# Patient Record
Sex: Female | Born: 1970 | Race: Black or African American | Hispanic: No | Marital: Single | Smoking: Never smoker
Health system: Southern US, Community
[De-identification: ages and names within clinical notes are randomized; demographics above are authoritative.]

## PROBLEM LIST (undated history)

## (undated) DIAGNOSIS — F419 Anxiety disorder, unspecified: Secondary | ICD-10-CM

## (undated) DIAGNOSIS — M8580 Other specified disorders of bone density and structure, unspecified site: Secondary | ICD-10-CM

## (undated) DIAGNOSIS — F32A Depression, unspecified: Secondary | ICD-10-CM

## (undated) DIAGNOSIS — E039 Hypothyroidism, unspecified: Secondary | ICD-10-CM

## (undated) DIAGNOSIS — D649 Anemia, unspecified: Secondary | ICD-10-CM

## (undated) DIAGNOSIS — Z87898 Personal history of other specified conditions: Secondary | ICD-10-CM

## (undated) DIAGNOSIS — C50919 Malignant neoplasm of unspecified site of unspecified female breast: Secondary | ICD-10-CM

## (undated) DIAGNOSIS — F329 Major depressive disorder, single episode, unspecified: Secondary | ICD-10-CM

## (undated) HISTORY — DX: Depression, unspecified: F32.A

## (undated) HISTORY — DX: Major depressive disorder, single episode, unspecified: F32.9

## (undated) HISTORY — DX: Anxiety disorder, unspecified: F41.9

## (undated) HISTORY — DX: Other specified disorders of bone density and structure, unspecified site: M85.80

## (undated) HISTORY — DX: Anemia, unspecified: D64.9

## (undated) HISTORY — DX: Malignant neoplasm of unspecified site of unspecified female breast: C50.919

---

## 2005-12-16 ENCOUNTER — Inpatient Hospital Stay (HOSPITAL_COMMUNITY): Admission: AD | Admit: 2005-12-16 | Discharge: 2005-12-18 | Payer: Self-pay | Admitting: Obstetrics & Gynecology

## 2007-02-08 ENCOUNTER — Emergency Department (HOSPITAL_COMMUNITY): Admission: EM | Admit: 2007-02-08 | Discharge: 2007-02-08 | Payer: Self-pay | Admitting: Emergency Medicine

## 2007-03-25 ENCOUNTER — Emergency Department (HOSPITAL_COMMUNITY): Admission: EM | Admit: 2007-03-25 | Discharge: 2007-03-25 | Payer: Self-pay | Admitting: Emergency Medicine

## 2008-04-14 ENCOUNTER — Emergency Department (HOSPITAL_COMMUNITY): Admission: EM | Admit: 2008-04-14 | Discharge: 2008-04-15 | Payer: Self-pay | Admitting: Emergency Medicine

## 2008-07-11 ENCOUNTER — Emergency Department (HOSPITAL_COMMUNITY): Admission: EM | Admit: 2008-07-11 | Discharge: 2008-07-12 | Payer: Self-pay | Admitting: Emergency Medicine

## 2008-12-20 ENCOUNTER — Emergency Department (HOSPITAL_COMMUNITY): Admission: EM | Admit: 2008-12-20 | Discharge: 2008-12-20 | Payer: Self-pay | Admitting: Emergency Medicine

## 2010-03-01 ENCOUNTER — Ambulatory Visit: Payer: Self-pay | Admitting: Physician Assistant

## 2010-03-01 DIAGNOSIS — R079 Chest pain, unspecified: Secondary | ICD-10-CM | POA: Insufficient documentation

## 2010-03-01 DIAGNOSIS — R519 Headache, unspecified: Secondary | ICD-10-CM | POA: Insufficient documentation

## 2010-03-01 DIAGNOSIS — F329 Major depressive disorder, single episode, unspecified: Secondary | ICD-10-CM

## 2010-03-01 DIAGNOSIS — R51 Headache: Secondary | ICD-10-CM

## 2010-03-01 DIAGNOSIS — F411 Generalized anxiety disorder: Secondary | ICD-10-CM | POA: Insufficient documentation

## 2010-03-01 LAB — CONVERTED CEMR LAB
ALT: 20 units/L (ref 0–35)
Basophils Absolute: 0 10*3/uL (ref 0.0–0.1)
Basophils Relative: 0 % (ref 0–1)
Calcium: 9.4 mg/dL (ref 8.4–10.5)
Chloride: 102 meq/L (ref 96–112)
Eosinophils Absolute: 0 10*3/uL (ref 0.0–0.7)
Eosinophils Relative: 1 % (ref 0–5)
HCT: 36.7 % (ref 36.0–46.0)
Hemoglobin: 12 g/dL (ref 12.0–15.0)
Lymphocytes Relative: 37 % (ref 12–46)
Lymphs Abs: 1.9 10*3/uL (ref 0.7–4.0)
MCHC: 32.7 g/dL (ref 30.0–36.0)
MCV: 83.4 fL (ref 78.0–100.0)
Platelets: 225 10*3/uL (ref 150–400)
Potassium: 4.1 meq/L (ref 3.5–5.3)
RDW: 13.5 % (ref 11.5–15.5)
Sodium: 136 meq/L (ref 135–145)
Total Bilirubin: 0.3 mg/dL (ref 0.3–1.2)
Total Protein: 7.7 g/dL (ref 6.0–8.3)
WBC: 5.2 10*3/uL (ref 4.0–10.5)

## 2010-03-06 ENCOUNTER — Encounter: Payer: Self-pay | Admitting: Physician Assistant

## 2010-04-30 ENCOUNTER — Encounter: Payer: Self-pay | Admitting: Physician Assistant

## 2010-05-09 ENCOUNTER — Other Ambulatory Visit: Admission: RE | Admit: 2010-05-09 | Discharge: 2010-05-09 | Payer: Self-pay | Admitting: Internal Medicine

## 2010-05-09 ENCOUNTER — Ambulatory Visit: Payer: Self-pay | Admitting: Physician Assistant

## 2010-05-09 ENCOUNTER — Telehealth: Payer: Self-pay | Admitting: Physician Assistant

## 2010-05-09 DIAGNOSIS — R82998 Other abnormal findings in urine: Secondary | ICD-10-CM | POA: Insufficient documentation

## 2010-05-09 DIAGNOSIS — L910 Hypertrophic scar: Secondary | ICD-10-CM | POA: Insufficient documentation

## 2010-05-09 LAB — CONVERTED CEMR LAB
Protein, U semiquant: NEGATIVE
Rapid HIV Screen: NEGATIVE
Specific Gravity, Urine: >=1.03
Urobilinogen, UA: 0.2
pH: 6

## 2010-05-10 ENCOUNTER — Encounter: Payer: Self-pay | Admitting: Physician Assistant

## 2010-05-10 LAB — CONVERTED CEMR LAB
Cholesterol, target level: 200 mg/dL
Cholesterol: 194 mg/dL (ref 0–200)
HDL: 53 mg/dL (ref >39)
Total CHOL/HDL Ratio: 3.7
VLDL: 12 mg/dL (ref 0–40)

## 2010-05-14 ENCOUNTER — Telehealth: Payer: Self-pay | Admitting: Physician Assistant

## 2010-05-15 ENCOUNTER — Encounter: Payer: Self-pay | Admitting: Physician Assistant

## 2010-05-16 ENCOUNTER — Telehealth: Payer: Self-pay | Admitting: Physician Assistant

## 2010-05-20 ENCOUNTER — Ambulatory Visit (HOSPITAL_COMMUNITY): Admission: RE | Admit: 2010-05-20 | Discharge: 2010-05-20 | Payer: Self-pay | Admitting: Internal Medicine

## 2010-06-14 ENCOUNTER — Telehealth: Payer: Self-pay | Admitting: Physician Assistant

## 2010-06-18 ENCOUNTER — Encounter: Payer: Self-pay | Admitting: Physician Assistant

## 2010-10-01 NOTE — Assessment & Plan Note (Signed)
Summary: NEW MEDICARE PT/ FIRST EST CARE//GK   Vital Signs:  Patient profile:   40 year old female Menstrual status:  regular LMP:     01/31/2010 Height:      64 inches Weight:      178 pounds BMI:     30.66 Temp:     97.8 degrees F oral Pulse rate:   66 / minute Pulse rhythm:   regular Resp:     18 per minute BP sitting:   103 / 69  (left arm) Cuff size:   regular  Vitals Entered By: [place] Shannon (March 01, 2010 11:56 AM) CC: pt says she has been having headaches off and on... pt would take bayer which helps a little... pt says she has sharp pain in her left breast x for a while now... Is Patient Diabetic? No Pain Assessment Patient in pain? no       Does patient need assistance? Functional Status Self care Ambulation Normal LMP (date): 01/31/2010     Menstrual Status regular Enter LMP: 01/31/2010   Primary Care Provider:  Tereso Newcomer, PA-C  CC:  pt says she has been having headaches off and on... pt would take bayer which helps a little... pt says she has sharp pain in her left breast x for a while now....  History of Present Illness: New patient.  Health maint: No pap in at least 4 years.  Never abnormal. Never had a mammo. Td shot out of date. Periods regular.  Last 2 days and can be heavy.    Headaches:  Has had for few years.  Notes when she is stressed or hungry.  Notes posterior and neck and frontal.  Not unilateral.  Has had rare occ. with ? scotoma.  + throbbing.  Has to stop everything she is doing.  No photophobia.  No nausea.  No tunnel vision or change in sense of smell.  Chest/Breast pain: On left side.  Went to ER in past.  Feels like pressure.  Notices with stress.   Walking makes her feel better.  No exertional pain.  Does have some DOE, but not significant.  No syncope.  No arm or jaw pain.  No palps.  No orthopnea, PND or edema.    Habits & Providers  Alcohol-Tobacco-Diet     Tobacco Status: never  Exercise-Depression-Behavior  Drug Use: no  Current Medications (verified): 1)  None  Allergies (verified): No Known Drug Allergies  Past History:  Past Medical History: Depression   a.  previously on meds (cymbalta; pristiq)   b.  had side effects and stopped   c.  followed at [facility] Anxiety  Past Surgical History: Denies surgical history  Family History: Mom - ovarian cancer; ?breast cancer GM - diabetes, HTN, ESRD Family History Depression - mom Schizophrenia - aunt  Social History: Single 4 kids Never Smoked Alcohol use-yes (rare) Drug use-no Smoking Status:  never Drug Use:  no  Review of Systems      See HPI Resp:  Denies cough and pleuritic. GI:  Denies bloody stools and dark tarry stools. GU:  Denies hematuria.  Physical Exam  General:  alert, well-developed, and well-nourished.   Head:  normocephalic and atraumatic.   Eyes:  pupils equal, pupils round, pupils reactive to light, and no optic disk abnormalities.   Ears:  R ear normal and L ear normal.   Nose:  no external deformity.   Mouth:  pharynx pink and moist.   Neck:  supple and no cervical lymphadenopathy.   Breasts:  skin/areolae normal, no masses, no abnormal thickening, no nipple discharge, no tenderness, and no adenopathy.   Lungs:  normal breath sounds, no crackles, and no wheezes.   Heart:  normal rate, regular rhythm, and no murmur.   Abdomen:  soft, non-tender, normal bowel sounds, and no hepatomegaly.   Neurologic:  alert & oriented X3 and cranial nerves II-XII intact.   Skin:  keloid center of chest Psych:  normally interactive.     Impression & Recommendations:  Problem # 1:  HEADACHE (ICD-784.0)  most likely tension headaches naproxen as needed advised her to keep headache diary f/u sooner if worse  Orders: T-Comprehensive Metabolic Panel ([id number]) T-CBC w/Diff (53664-40347) T-TSH (42595-63875)  Her updated medication list for this problem includes:    Naproxen 500 Mg  Tabs (Naproxen) .Marland Kitchen... Take 1 tablet by mouth two times a day with food as needed  Problem # 2:  DEPRESSION (ICD-311)  f/u with psych perhaps, finding the  right medicine may help her headaches  Orders: T-Comprehensive Metabolic Panel [phone]) T-CBC w/Diff (41660-63016) T-TSH [phone])  Problem # 3:  CHEST PAIN UNSPECIFIED (ICD-786.50) prob chest wall pain no CRFs  check ECG  reassurance  Orders: EKG w/ Interpretation (93000) T-Comprehensive Metabolic Panel ([id number]) T-CBC w/Diff (62376-28315) T-TSH (17616-07371)  Problem # 4:  Preventive Health Care (ICD-V70.0) schedule CPP get Td today  Orders: T-Comprehensive Metabolic Panel ([id number]) T-CBC w/Diff (27035-00938) T-TSH (18299-37169)  Complete Medication List: 1)  Naproxen 500 Mg Tabs (Naproxen) .... Take 1 tablet by mouth two times a day with food as needed  Patient Instructions: 1)  Td shot today. 2)  Keep a headache diary and bring to your next appointment. 3)  Please schedule a follow-up appointment in 2 months with Damaris Geers for CPP.  Come fasting so we can check your cholesterol. 4)  Use Naproxen two times a day with food as needed for headaches.  Prescriptions: NAPROXEN 500 MG TABS (NAPROXEN) Take 1 tablet by mouth two times a day with food as needed  #30 x 1   Entered and Authorized by:   Tereso Newcomer PA-C   Signed by:   Tereso Newcomer PA-C on 03/01/2010   Method used:   Print then Give to Patient   RxID:   [id number]    EKG  Procedure date:  03/01/2010  Findings:      Normal sinus rhythm with rate of:  70 normal axis no isch changes j point elevation

## 2010-10-01 NOTE — Letter (Signed)
Summary: [facility]  [facility]   Imported By: Arta Bruce 05/10/2010 14:21:58  _____________________________________________________________________  External Attachment:    Type:   Image     Comment:   External Document

## 2010-10-01 NOTE — Letter (Signed)
Summary: *HSN Results Follow up  [facility]  [address] [place], [place] [address]   Phone: [phone]  Fax: [phone]      05/10/2010   ZEPPELIN BECKSTRAND Rossel [address] [place], [place]  [address]   Dear  Ms. Jazmeen Guess,                            ____S.Drinkard,FNP   ____D. Gore,FNP       ____B. McPherson,MD   ____V. Rankins,MD    ____E. Mulberry,MD    ____N. Daphine Deutscher, FNP  ____D. Reche Dixon, MD    ____K. Philipp Deputy, MD    __x__S. Alben Spittle, PA-C     This letter is to inform you that your recent test(s):  _______Pap Smear    ___x____Lab Test     _______X-ray    ___x____ is within acceptable limits  _______ requires a medication change  _______ requires a follow-up lab visit  _______ requires a follow-up visit with your provider   Comments: Total cholesterol = 194; Triglycerides: 61; HDL(good cholesterol) = 53; LDL (bad cholesterol) = 129.  These numbers are good.       _________________________________________________________ If you have any questions, please contact our office                     Sincerely,  Tereso Newcomer PA-C [facility]

## 2010-10-01 NOTE — Letter (Signed)
Summary: *HSN Results Follow up  [facility]  [address] [place], [place] [address]   Phone: [phone]  Fax: [phone]      03/06/2010   [name] Zapf [address] [place], [place]  [address]   Dear  Ms. Maria Carter,                            ____S.Drinkard,FNP   ____D. Gore,FNP       ____B. McPherson,MD   ____V. Rankins,MD    ____E. Mulberry,MD    ____N. Daphine Deutscher, FNP  ____D. Reche Dixon, MD    ____K. Philipp Deputy, MD    __x__S. Alben Spittle, PA-C    This letter is to inform you that your recent test(s):  _______Pap Smear    ___x____Lab Test     _______X-ray    ___x____ is within acceptable limits  _______ requires a medication change  _______ requires a follow-up lab visit  _______ requires a follow-up visit with your provider   Comments:       _________________________________________________________ If you have any questions, please contact our office                     Sincerely,  Tereso Newcomer PA-C [facility]

## 2010-10-01 NOTE — Letter (Signed)
Summary: *HSN Results Follow up  [facility]  [address] [place], [place] [address]   Phone: [phone]  Fax: [phone]      05/15/2010   Maria Carter [place] [address] [place], [place]  [address]   Dear  [name],                            ____S.Drinkard,FNP   ____D. Gore,FNP       ____B. McPherson,MD   ____V. Rankins,MD    ____E. Mulberry,MD    ____N. Daphine Deutscher, FNP  ____D. Reche Dixon, MD    ____K. Philipp Deputy, MD    __x__S. Alben Spittle, PA-C     This letter is to inform you that your recent test(s):  ___x____Pap Smear    _______Lab Test     _______X-ray    ___x____ is normal  _______ requires a medication change  _______ requires a follow-up lab visit  _______ requires a follow-up visit with your provider   Comments:       _________________________________________________________ If you have any questions, please contact our office                     Sincerely,  Tereso Newcomer PA-C [facility]

## 2010-10-01 NOTE — Progress Notes (Signed)
  Phone Note Outgoing Call   Summary of Call: PT HAS A SCRIPT TO FAX/CALLED PT L/M TO GIVE [place] A CALL BACK Augustina Mood  TO FAX Initial call taken by: Arta Bruce,  May 14, 2010 12:53 PM  Follow-up for Phone Call        [facility] Follow-up by: [place] Shannon,  May 14, 2010 3:32 PM

## 2010-10-01 NOTE — Assessment & Plan Note (Signed)
Summary: CPP /tmm   Vital Signs:  Patient profile:   40 year old female Menstrual status:  regular Height:      64 inches Weight:      183 pounds BMI:     31.53 Temp:     97.8 degrees F oral Pulse rate:   64 / minute Pulse rhythm:   regular Resp:     18 per minute BP sitting:   132 / 90  (left arm) Cuff size:   regular  Vitals Entered By: Linzie Collin student cma (May 09, 2010 9:09 AM) CC: cpp.............Marland Kitchen review meds.. pt says she is not taking any Is Patient Diabetic? No Pain Assessment Patient in pain? no       Does patient need assistance? Functional Status Self care Ambulation Normal   Primary Care Provider:  Tereso Newcomer, PA-C  CC:  cpp.............Marland Kitchen review meds.. pt says she is not taking any.  History of Present Illness: Here for CPP. No h/o abnormal paps. No abnormal bleeding, discharge or odor. +FHx of Breast CA - sister. + Depression  Problems Prior to Update: 1)  Keloid Scar  (ICD-701.4) 2)  Urinalysis, Abnormal  (ICD-791.9) 3)  Breast Cancer, Family Hx  (ICD-V16.3) 4)  Preventive Health Care  (ICD-V70.0) 5)  Chest Pain Unspecified  (ICD-786.50) 6)  Headache  (ICD-784.0) 7)  Family History Depression  (ICD-V17.0) 8)  Anxiety  (ICD-300.00) 9)  Depression  (ICD-311)  Allergies (verified): No Known Drug Allergies  Past History:  Past Medical History: Last updated: 03/01/2010 Depression   a.  previously on meds (cymbalta; pristiq)   b.  had side effects and stopped   c.  followed at [facility] Anxiety  Past Surgical History: Last updated: 03/01/2010 Denies surgical history  Family History: Reviewed history from 03/01/2010 and no changes required. Mom - ovarian cancer; ?breast cancer GM - diabetes, HTN, ESRD Family History Depression - mom Schizophrenia - aunt  Social History: Reviewed history from 03/01/2010 and no changes required. Single 4 kids Never Smoked Alcohol use-yes (rare) Drug  use-no  Review of Systems      See HPI General:  Denies chills and fever. CV:  Denies shortness of breath with exertion. GI:  Denies bloody stools and dark tarry stools. GU:  Denies dysuria and hematuria. Derm:  Complains of lesion(s); keloids on chest . Psych:  See HPI; Denies suicidal thoughts/plans.  Physical Exam  General:  alert, well-developed, and well-nourished.   Head:  normocephalic and atraumatic.   Eyes:  pupils equal, pupils round, and pupils reactive to light.   Ears:  R ear normal and L ear normal.   Nose:  no external deformity.   Mouth:  pharynx pink and moist, no erythema, and no exudates.   Neck:  supple, no thyromegaly, and no cervical lymphadenopathy.   Breasts:  deferred done last visit  Lungs:  normal breath sounds, no crackles, and no wheezes.   Heart:  normal rate and regular rhythm.   Abdomen:  soft, non-tender, normal bowel sounds, and no hepatomegaly.   Rectal:  deferred Genitalia:  normal introitus, no external lesions, mucosa pink and moist, no vaginal or cervical lesions, no vaginal atrophy, normal uterus size and position, and no adnexal masses or tenderness.   milky white discharge Msk:  normal ROM.   Pulses:  R posterior tibial normal, R dorsalis pedis normal, L posterior tibial normal, and L dorsalis pedis normal.   Extremities:  no edema  Neurologic:  alert & oriented  X3 and cranial nerves II-XII intact.   Skin:  keloid on anterior chest - mod large Psych:  normally interactive and flat affect.     Impression & Recommendations:  Problem # 1:  PREVENTIVE HEALTH CARE (ICD-V70.0)  Orders: UA Dipstick w/o Micro (manual) ([address]) KOH/ WET Mount [phone]) T-Lipid Profile [phone]) T-HIV Antibody  (Reflex) [phone]) T-Pap Smear, Thin Prep ([address]) Mammogram (Screening) (Mammo)  Problem # 2:  DEPRESSION (ICD-311)  PHQ9=16 no suicidal ideations has tried multiple meds in past with side effects to  all: cymbalta zoloft wellbutrin lexapro  liked cymbalta the best will try again refer to A. Vaughan Her updated medication list for this problem includes:    Cymbalta 30 Mg Cpep (Duloxetine hcl) .Marland Kitchen... Take 1 tablet by mouth once a day  Orders: Psychology Referral (Psychology)  Problem # 3:  KELOID SCAR (ICD-701.4)  refer to derm  Orders: Dermatology Referral (Derma)  Problem # 4:  URINALYSIS, ABNORMAL (ICD-791.9)  no symptoms  Orders: T-Culture, Urine ([address]-28413) T- * Misc. Laboratory test [phone])  Complete Medication List: 1)  Naproxen 500 Mg Tabs (Naproxen) .... Take 1 tablet by mouth two times a day with food as needed 2)  Cymbalta 30 Mg Cpep (Duloxetine hcl) .... Take 1 tablet by mouth once a day  Patient Instructions: 1)  Bring tax returns from 2010 to pick up your prescription for Cymbalta.  If not taxes filed, bring proof of no income.  I faxed Cymbalta to [facility]. pharmacy.  You can pick it up tomorrow, as long as you bring your paperwork. 2)  Schedule appt with Ethelene Browns in next 2 weeks. 3)  Schedule appt with Scott in 4 weeks for depression. Prescriptions: CYMBALTA 30 MG CPEP (DULOXETINE HCL) Take 1 tablet by mouth once a day  #30 x 2   Entered and Authorized by:   Tereso Newcomer PA-C   Signed by:   Tereso Newcomer PA-C on 05/09/2010   Method used:   Printed then faxed to ...         RxID:   [id number]   Laboratory Results   Urine Tests    Routine Urinalysis   Glucose: negative   (Normal Range: Negative) Bilirubin: negative   (Normal Range: Negative) Ketone: negative   (Normal Range: Negative) Spec. Gravity: >=1.030   (Normal Range: 1.003-1.035) Blood: trace-intact   (Normal Range: Negative) pH: 6.0   (Normal Range: 5.0-8.0) Protein: negative   (Normal Range: Negative) Urobilinogen: 0.2   (Normal Range: 0-1) Nitrite: negative   (Normal Range: Negative) Leukocyte Esterace: trace   (Normal Range: Negative)    Date/Time Received:  May 09, 2010 10:38 AM   Wet Mount Source: vaginal WBC/hpf: 1-5 Bacteria/hpf: rare Clue cells/hpf: few  Negative whiff Yeast/hpf: none Wet Mount KOH: Negative Trichomonas/hpf: none  Other Tests  Rapid HIV: negative Comments: no symptoms of vaginal discharge     Laboratory Results   Urine Tests    Routine Urinalysis   Glucose: negative   (Normal Range: Negative) Bilirubin: negative   (Normal Range: Negative) Ketone: negative   (Normal Range: Negative) Spec. Gravity: >=1.030   (Normal Range: 1.003-1.035) Blood: trace-intact   (Normal Range: Negative) pH: 6.0   (Normal Range: 5.0-8.0) Protein: negative   (Normal Range: Negative) Urobilinogen: 0.2   (Normal Range: 0-1) Nitrite: negative   (Normal Range: Negative) Leukocyte Esterace: trace   (Normal Range: Negative)      Wet Mount/KOH  Negative whiff  Other Tests  Rapid HIV: negative Comments: no symptoms  of vaginal discharge

## 2010-10-01 NOTE — Progress Notes (Signed)
Summary: Office Visit/DEPRESSION SCREENING  Office Visit/DEPRESSION SCREENING   Imported By: Arta Bruce 05/10/2010 15:10:48  _____________________________________________________________________  External Attachment:    Type:   Image     Comment:   External Document

## 2010-10-01 NOTE — Progress Notes (Signed)
  Phone Note Call from Patient   Caller: Patient Summary of Call: PT SAID SHE GETS HER MEDS FROM [facility]  Initial call taken by: Oscar La,  May 16, 2010 9:22 AM  Follow-up for Phone Call        we are aware and script is faxed Follow-up by: Armenia Shannon,  May 16, 2010 3:23 PM

## 2010-10-01 NOTE — Progress Notes (Signed)
Summary: Dermatology Referral  Phone Note Outgoing Call   Summary of Call: refer to dermatology for keloid scar Initial call taken by: Brynda Rim,  May 09, 2010 10:05 AM  Follow-up for Phone Call        PT HAVE AN APPT 07-04-10 @ 10 AM [facility]  LVM TO PT  Follow-up by: Cheryll Dessert,  May 14, 2010 11:41 AM

## 2010-10-01 NOTE — Letter (Signed)
Summary: DENTAL REFERRAL  DENTAL REFERRAL   Imported By: Arta Bruce 06/19/2010 11:31:41  _____________________________________________________________________  External Attachment:    Type:   Image     Comment:   External Document

## 2010-10-01 NOTE — Progress Notes (Signed)
Summary: DENTAL REFERRAL FAXED  Phone Note Call from Patient Call back at Home Phone [phone]   Reason for Call: Referral Summary of Call: WEAVER PT. PATIENT HAVING DENTAL PAIN, AND I FAXED A REFERRAL OVER TO DENTAL CLINIC. Initial call taken by: Leodis Rains,  June 14, 2010 3:59 PM

## 2010-10-31 ENCOUNTER — Telehealth (INDEPENDENT_AMBULATORY_CARE_PROVIDER_SITE_OTHER): Payer: Self-pay | Admitting: Nurse Practitioner

## 2010-11-07 NOTE — Progress Notes (Signed)
Summary: Cough, cold symptoms  Phone Note Call from Patient   Summary of Call: Has had cough x3 weeks, got if from her kids.  Had a fever last weekend, now resolved.  Drinking hot tea, water.  Denies nausea/vomiting/diarrhea.  Has had sore throat, better now.  Coughing throughout day, mainly in the morning when she wakes up, productive of yellow mucus.  Cough is the same -- has not improved, sometimes feels like something is caught in her throat.  Denies SOB, some CP when she coughs only.  Denies coughing blood.  Is getting hot and cold.  Last night woke up and shirt was wet.  Has been taking Tylenol to avoid getting fever.  Having nasal congestion and drainage, clear.  Denies HA.  Has not taken anything OTC except Tylenol.  Advised to take Tylenol for fever over 100 unless uncomfortable, to allow body to fight fever naturally.  Also advised per cold protocol -- humidify home, drink plenty of water, limit use of strong scents and strong-odored cleaning products until cough resolves due to irritation.  Sleep with head of bed slightly elevated to allow better drainage, use Robitussin and Mucinex DM to loosen secretions and ease cough.  Will call back if cough persists past a few more days or worsens.  Initial call taken by: Dutch Quint RN,  October 31, 2010 11:32 AM

## 2011-05-02 ENCOUNTER — Other Ambulatory Visit (HOSPITAL_COMMUNITY): Payer: Self-pay | Admitting: Family Medicine

## 2011-05-02 DIAGNOSIS — Z1231 Encounter for screening mammogram for malignant neoplasm of breast: Secondary | ICD-10-CM

## 2011-05-22 ENCOUNTER — Ambulatory Visit (HOSPITAL_COMMUNITY): Payer: Medicare Other

## 2011-05-28 ENCOUNTER — Ambulatory Visit (HOSPITAL_COMMUNITY): Payer: Medicare Other | Attending: Family Medicine

## 2011-05-30 LAB — URINALYSIS, ROUTINE W REFLEX MICROSCOPIC
Hgb urine dipstick: NEGATIVE
Nitrite: NEGATIVE
Protein, ur: NEGATIVE
Specific Gravity, Urine: 1.026
Urobilinogen, UA: 1

## 2011-05-30 LAB — CBC
HCT: 36.3
RBC: 4.24
WBC: 4.9

## 2011-05-30 LAB — POCT I-STAT, CHEM 8
BUN: 13
Calcium, Ion: 1.1 — ABNORMAL LOW
Chloride: 104
Sodium: 138

## 2011-05-30 LAB — DIFFERENTIAL
Eosinophils Absolute: 0
Lymphs Abs: 1.7
Monocytes Relative: 9
Neutro Abs: 2.7

## 2011-05-30 LAB — WET PREP, GENITAL
Trich, Wet Prep: NONE SEEN
WBC, Wet Prep HPF POC: NONE SEEN
Yeast Wet Prep HPF POC: NONE SEEN

## 2011-05-30 LAB — URINE MICROSCOPIC-ADD ON

## 2011-05-30 LAB — GC/CHLAMYDIA PROBE AMP, GENITAL: GC Probe Amp, Genital: NEGATIVE

## 2011-05-30 LAB — POCT PREGNANCY, URINE: Preg Test, Ur: NEGATIVE

## 2011-06-03 LAB — DIFFERENTIAL
Basophils Absolute: 0
Eosinophils Relative: 1
Lymphocytes Relative: 44
Monocytes Relative: 9
Neutro Abs: 1.7

## 2011-06-03 LAB — CBC
MCHC: 32.4
MCV: 85.3
Platelets: 190
RBC: 4.34
RDW: 12.9
WBC: 3.7 — ABNORMAL LOW

## 2011-06-03 LAB — POCT I-STAT, CHEM 8
Calcium, Ion: 1.2
Glucose, Bld: 101 — ABNORMAL HIGH
Hemoglobin: 12.9
Potassium: 3.6
Sodium: 140
TCO2: 28

## 2011-06-03 LAB — POCT CARDIAC MARKERS
Myoglobin, poc: 35.3
Troponin i, poc: <0.05

## 2011-06-03 LAB — D-DIMER, QUANTITATIVE: D-Dimer, Quant: <0.22

## 2011-06-16 LAB — I-STAT 8, (EC8 V) (CONVERTED LAB)
Acid-Base Excess: 1
Bicarbonate: 26.2 — ABNORMAL HIGH
Chloride: 104
Glucose, Bld: 91
Sodium: 138
pCO2, Ven: 43 — ABNORMAL LOW

## 2011-06-16 LAB — POCT CARDIAC MARKERS
CKMB, poc: <1 — ABNORMAL LOW
Troponin i, poc: <0.05

## 2011-06-16 LAB — D-DIMER, QUANTITATIVE (NOT AT ARMC): D-Dimer, Quant: <0.22

## 2011-06-16 LAB — POCT I-STAT CREATININE: Creatinine, Ser: 0.8

## 2013-02-05 DIAGNOSIS — G43909 Migraine, unspecified, not intractable, without status migrainosus: Secondary | ICD-10-CM | POA: Insufficient documentation

## 2013-04-25 DIAGNOSIS — F988 Other specified behavioral and emotional disorders with onset usually occurring in childhood and adolescence: Secondary | ICD-10-CM | POA: Insufficient documentation

## 2013-09-12 DIAGNOSIS — F330 Major depressive disorder, recurrent, mild: Secondary | ICD-10-CM | POA: Insufficient documentation

## 2014-05-29 DIAGNOSIS — R1031 Right lower quadrant pain: Secondary | ICD-10-CM | POA: Diagnosis present

## 2014-05-29 DIAGNOSIS — N200 Calculus of kidney: Secondary | ICD-10-CM | POA: Insufficient documentation

## 2014-05-29 DIAGNOSIS — Z3202 Encounter for pregnancy test, result negative: Secondary | ICD-10-CM | POA: Insufficient documentation

## 2014-05-29 DIAGNOSIS — K5900 Constipation, unspecified: Secondary | ICD-10-CM | POA: Insufficient documentation

## 2014-05-29 DIAGNOSIS — R112 Nausea with vomiting, unspecified: Secondary | ICD-10-CM | POA: Diagnosis not present

## 2014-05-29 LAB — URINALYSIS, ROUTINE W REFLEX MICROSCOPIC
BILIRUBIN URINE: NEGATIVE
Glucose, UA: NEGATIVE mg/dL
Hgb urine dipstick: NEGATIVE
KETONES UR: NEGATIVE mg/dL
LEUKOCYTES UA: NEGATIVE
NITRITE: NEGATIVE
PROTEIN: NEGATIVE mg/dL
Specific Gravity, Urine: 1.022 (ref 1.005–1.030)
UROBILINOGEN UA: 1 mg/dL (ref 0.0–1.0)
pH: 8 (ref 5.0–8.0)

## 2014-05-29 LAB — COMPREHENSIVE METABOLIC PANEL
ALT: 15 U/L (ref 0–35)
ANION GAP: 12 (ref 5–15)
AST: 20 U/L (ref 0–37)
Albumin: 3.9 g/dL (ref 3.5–5.2)
Alkaline Phosphatase: 82 U/L (ref 39–117)
BUN: 11 mg/dL (ref 6–23)
CALCIUM: 9.2 mg/dL (ref 8.4–10.5)
CO2: 27 meq/L (ref 19–32)
CREATININE: 0.68 mg/dL (ref 0.50–1.10)
Chloride: 99 mEq/L (ref 96–112)
GLUCOSE: 99 mg/dL (ref 70–99)
Potassium: 4.2 mEq/L (ref 3.7–5.3)
SODIUM: 138 meq/L (ref 137–147)
TOTAL PROTEIN: 8.3 g/dL (ref 6.0–8.3)
Total Bilirubin: 0.2 mg/dL — ABNORMAL LOW (ref 0.3–1.2)

## 2014-05-29 LAB — CBC WITH DIFFERENTIAL/PLATELET
Basophils Absolute: 0 10*3/uL (ref 0.0–0.1)
Basophils Relative: 0 % (ref 0–1)
Eosinophils Absolute: 0 10*3/uL (ref 0.0–0.7)
Eosinophils Relative: 1 % (ref 0–5)
HCT: 37.1 % (ref 36.0–46.0)
Hemoglobin: 12.2 g/dL (ref 12.0–15.0)
LYMPHS ABS: 1 10*3/uL (ref 0.7–4.0)
LYMPHS PCT: 19 % (ref 12–46)
MCH: 26.4 pg (ref 26.0–34.0)
MCHC: 32.9 g/dL (ref 30.0–36.0)
MCV: 80.3 fL (ref 78.0–100.0)
MONO ABS: 0.3 10*3/uL (ref 0.1–1.0)
Monocytes Relative: 6 % (ref 3–12)
Neutro Abs: 3.9 10*3/uL (ref 1.7–7.7)
Neutrophils Relative %: 74 % (ref 43–77)
PLATELETS: 204 10*3/uL (ref 150–400)
RBC: 4.62 MIL/uL (ref 3.87–5.11)
RDW: 13.6 % (ref 11.5–15.5)
WBC: 5.3 10*3/uL (ref 4.0–10.5)

## 2014-05-29 LAB — POC URINE PREG, ED: PREG TEST UR: NEGATIVE

## 2014-05-29 LAB — LIPASE, BLOOD: LIPASE: 15 U/L (ref 11–59)

## 2014-05-29 MED ORDER — ONDANSETRON 4 MG PO TBDP
8.0000 mg | ORAL_TABLET | Freq: Once | ORAL | Status: AC
  Administered 2014-05-29: 8 mg via ORAL
  Filled 2014-05-29: qty 2

## 2014-05-29 MED ORDER — OXYCODONE-ACETAMINOPHEN 5-325 MG PO TABS
1.0000 | ORAL_TABLET | Freq: Once | ORAL | Status: AC
  Administered 2014-05-29: 1 via ORAL
  Filled 2014-05-29: qty 1

## 2014-05-29 NOTE — ED Notes (Signed)
Pt c/o right side lower abdominal pain, and emesis x 1 starting around 1830. Pt denies dysuria, hematuria, vaginal bleeding and vaginal discharge.

## 2014-05-30 ENCOUNTER — Emergency Department (HOSPITAL_COMMUNITY)
Admission: EM | Admit: 2014-05-30 | Discharge: 2014-05-30 | Disposition: A | Discharge status: Home / Self Care | Payer: Medicare Other | Attending: Emergency Medicine | Admitting: Emergency Medicine

## 2014-05-30 ENCOUNTER — Emergency Department (HOSPITAL_COMMUNITY): Payer: Medicare Other

## 2014-05-30 ENCOUNTER — Encounter (HOSPITAL_COMMUNITY): Payer: Self-pay

## 2014-05-30 DIAGNOSIS — K5900 Constipation, unspecified: Secondary | ICD-10-CM

## 2014-05-30 DIAGNOSIS — R1031 Right lower quadrant pain: Secondary | ICD-10-CM

## 2014-05-30 DIAGNOSIS — N200 Calculus of kidney: Secondary | ICD-10-CM

## 2014-05-30 MED ORDER — DOCUSATE SODIUM 100 MG PO CAPS: 100.0000 mg | ORAL_CAPSULE | Freq: Two times a day (BID) | ORAL | Status: DC

## 2014-05-30 MED ORDER — SODIUM CHLORIDE 0.9 % IV BOLUS (SEPSIS)
500.0000 mL | Freq: Once | INTRAVENOUS | Status: AC
  Administered 2014-05-30: 500 mL via INTRAVENOUS

## 2014-05-30 MED ORDER — IOHEXOL 300 MG/ML  SOLN
100.0000 mL | Freq: Once | INTRAMUSCULAR | Status: AC | PRN
  Administered 2014-05-30: 100 mL via INTRAVENOUS

## 2014-05-30 MED ORDER — MORPHINE SULFATE 4 MG/ML IJ SOLN
6.0000 mg | Freq: Once | INTRAMUSCULAR | Status: AC
  Administered 2014-05-30: 6 mg via INTRAVENOUS
  Filled 2014-05-30: qty 2

## 2014-05-30 NOTE — Discharge Instructions (Signed)
If you were given medicines take as directed.  If you are on coumadin or contraceptives realize their levels and effectiveness is altered by many different medicines.  If you have any reaction (rash, tongues swelling, other) to the medicines stop taking and see a physician.   Please follow up as directed and return to the ER or see a physician for new or worsening symptoms.  Thank you. Filed Vitals:   05/30/14 0101 05/30/14 0115 05/30/14 0130 05/30/14 0145  BP: 97/76 101/64 111/57 117/65  Pulse: 70 66 69 75  Temp:      TempSrc:      Resp: 16 15 16 17   SpO2: 98% 100% 99% 100%

## 2014-05-30 NOTE — ED Provider Notes (Signed)
CSN: [id number]     Arrival date & time 05/29/14  2115 History   First MD Initiated Contact with Patient 05/30/14 0054     Chief Complaint  Patient presents with  . Abdominal Pain  . Emesis     (Consider location/radiation/quality/duration/timing/severity/associated sxs/prior Treatment) HPI Comments: 43 year old female with anxiety, depression presents with right lower corner abdominal pain since this evening. No history of similar. No vaginal or urinary symptoms. Constant ache nonradiating. Mild sharp component at times. No abdominal surgery history.  Patient is a 43 y.o. female presenting with abdominal pain and vomiting. The history is provided by the patient.  Abdominal Pain Associated symptoms: nausea and vomiting   Associated symptoms: no chest pain, no chills, no dysuria, no fever and no shortness of breath   Emesis Associated symptoms: abdominal pain   Associated symptoms: no chills and no headaches     History reviewed. No pertinent past medical history. No past surgical history on file. No family history on file. History  Substance Use Topics  . Smoking status: Not on file  . Smokeless tobacco: Not on file  . Alcohol Use: Not on file   OB History   Grav Para Term Preterm Abortions TAB SAB Ect Mult Living                 Review of Systems  Constitutional: Positive for appetite change. Negative for fever and chills.  HENT: Negative for congestion.   Eyes: Negative for visual disturbance.  Respiratory: Negative for shortness of breath.   Cardiovascular: Negative for chest pain.  Gastrointestinal: Positive for nausea, vomiting and abdominal pain.  Genitourinary: Negative for dysuria and flank pain.  Musculoskeletal: Negative for back pain, neck pain and neck stiffness.  Skin: Negative for rash.  Neurological: Negative for light-headedness and headaches.      Allergies  Review of patient's allergies indicates no known allergies.  Home Medications   Prior  to Admission medications   Medication Sig Start Date End Date Taking? Authorizing Provider  ibuprofen (ADVIL,MOTRIN) 200 MG tablet Take 200 mg by mouth every 6 (six) hours as needed for moderate pain.   Yes Historical Provider, MD  docusate sodium (COLACE) 100 MG capsule Take 1 capsule (100 mg total) by mouth every 12 (twelve) hours. 05/30/14   Mariea Clonts, MD   BP 117/65  Pulse 75  Temp(Src) 98.7 F (37.1 C) (Oral)  Resp 17  SpO2 100%  LMP 05/19/2014 Physical Exam  Nursing note and vitals reviewed. Constitutional: She is oriented to person, place, and time. She appears well-developed and well-nourished.  HENT:  Head: Normocephalic and atraumatic.  Eyes: Conjunctivae are normal. Right eye exhibits no discharge. Left eye exhibits no discharge.  Neck: Normal range of motion. Neck supple. No tracheal deviation present.  Cardiovascular: Normal rate and regular rhythm.   Pulmonary/Chest: Effort normal and breath sounds normal.  Abdominal: Soft. She exhibits no distension. There is tenderness (focal right lower quadrant abdominal pain, no guarding). There is no guarding.  Musculoskeletal: She exhibits no edema.  Neurological: She is alert and oriented to person, place, and time.  Skin: Skin is warm. No rash noted.  Psychiatric: She has a normal mood and affect.    ED Course  Procedures (including critical care time) Labs Review Labs Reviewed  COMPREHENSIVE METABOLIC PANEL - Abnormal; Notable for the following:    Total Bilirubin 0.2 (*)    All other components within normal limits  URINALYSIS, ROUTINE W REFLEX MICROSCOPIC - Abnormal; Notable for  the following:    APPearance CLOUDY (*)    All other components within normal limits  CBC WITH DIFFERENTIAL  LIPASE, BLOOD  POC URINE PREG, ED    Imaging Review Ct Abdomen Pelvis W Contrast  05/30/2014   CLINICAL DATA:  Right lower quadrant pain, vomiting, evaluate for kidney stone versus appendicitis  EXAM: CT ABDOMEN AND PELVIS  WITH CONTRAST  TECHNIQUE: Multidetector CT imaging of the abdomen and pelvis was performed using the standard protocol following bolus administration of intravenous contrast.  CONTRAST:  1108mL OMNIPAQUE IOHEXOL 300 MG/ML  SOLN  COMPARISON:  None.  FINDINGS: Lower chest:  Lung bases are clear.  Hepatobiliary: Liver is within normal limits.  Gallbladder is unremarkable. No intrahepatic or extrahepatic ductal dilatation.  Spleen: Within normal limits.  Pancreas: Within normal limits.  Stomach/Bowel: Stomach is unremarkable.  No evidence of bowel obstruction.  Normal appendix.  Mild to moderate stool in the right colon and rectum.  Adrenals/urinary tract: Adrenal glands are unremarkable.  4 mm nonobstructing interpolar left renal calculus (series 2/ image 31). Right kidney is unremarkable. No hydronephrosis.  No ureteral or bladder calculi.  Bladder is within normal limits.  Vascular/Lymphatic: No evidence of abdominal aortic aneurysm.  No suspicious abdominopelvic lymphadenopathy.  Reproductive: Uterus and bilateral ovaries are within normal limits.  Musculoskeletal: Visualized osseous structures are within normal limits.  Other: Trace pelvic ascites, likely physiologic.  IMPRESSION: No evidence of bowel obstruction.  Normal appendix.  4 mm nonobstructing interpolar left renal calculus. No ureteral or bladder calculi. No hydronephrosis.  Mild to moderate colonic stool burden, raising the possibility of constipation.   Electronically Signed   By: Julian Hy M.D.   On: 05/30/2014 02:28     EKG Interpretation None      MDM   Final diagnoses:  Right lower quadrant abdominal pain  Constipation, unspecified constipation type  Kidney stone on left side   Patient presented with right lower abdominal pain nausea and vomiting, discussed CT scan to rule out appendicitis and ultrasound kidney stone. CT scan reviewed results kidney stone the left kidney, no acute findings, stool burden. Patient improved on  recheck and discuss outpatient follow.  Results and differential diagnosis were discussed with the patient/parent/guardian. Close follow up outpatient was discussed, comfortable with the plan.   Medications  ondansetron (ZOFRAN-ODT) disintegrating tablet 8 mg (8 mg Oral Given 05/29/14 2136)  oxyCODONE-acetaminophen (PERCOCET/ROXICET) 5-325 MG per tablet 1 tablet (1 tablet Oral Given 05/29/14 2138)  sodium chloride 0.9 % bolus 500 mL (500 mLs Intravenous New Bag/Given 05/30/14 0139)  morphine 4 MG/ML injection 6 mg (6 mg Intravenous Given 05/30/14 0139)  iohexol (OMNIPAQUE) 300 MG/ML solution 100 mL (100 mLs Intravenous Contrast Given 05/30/14 0159)    Filed Vitals:   05/30/14 0101 05/30/14 0115 05/30/14 0130 05/30/14 0145  BP: 97/76 101/64 111/57 117/65  Pulse: 70 66 69 75  Temp:      TempSrc:      Resp: 16 15 16 17   SpO2: 98% 100% 99% 100%    Final diagnoses:  Right lower quadrant abdominal pain  Constipation, unspecified constipation type  Kidney stone on left side       Mariea Clonts, MD 05/30/14 [phone]

## 2015-01-18 ENCOUNTER — Encounter (HOSPITAL_COMMUNITY): Payer: Self-pay | Admitting: *Deleted

## 2015-01-18 ENCOUNTER — Emergency Department (HOSPITAL_COMMUNITY)
Admission: EM | Admit: 2015-01-18 | Discharge: 2015-01-18 | Disposition: A | Discharge status: Home / Self Care | Payer: Medicare Other | Attending: Emergency Medicine | Admitting: Emergency Medicine

## 2015-01-18 DIAGNOSIS — H9209 Otalgia, unspecified ear: Secondary | ICD-10-CM | POA: Diagnosis not present

## 2015-01-18 DIAGNOSIS — K0889 Other specified disorders of teeth and supporting structures: Secondary | ICD-10-CM

## 2015-01-18 DIAGNOSIS — K088 Other specified disorders of teeth and supporting structures: Secondary | ICD-10-CM | POA: Diagnosis not present

## 2015-01-18 DIAGNOSIS — R51 Headache: Secondary | ICD-10-CM | POA: Diagnosis present

## 2015-01-18 MED ORDER — PENICILLIN V POTASSIUM 500 MG PO TABS: 500.0000 mg | ORAL_TABLET | Freq: Four times a day (QID) | ORAL | Status: DC

## 2015-01-18 MED ORDER — IBUPROFEN 800 MG PO TABS
800.0000 mg | ORAL_TABLET | Freq: Once | ORAL | Status: AC
  Administered 2015-01-18: 800 mg via ORAL
  Filled 2015-01-18: qty 1

## 2015-01-18 MED ORDER — PENICILLIN V POTASSIUM 500 MG PO TABS: 500.0000 mg | ORAL_TABLET | Freq: Four times a day (QID) | ORAL | Status: AC

## 2015-01-18 MED ORDER — PENICILLIN V POTASSIUM 250 MG PO TABS
500.0000 mg | ORAL_TABLET | Freq: Once | ORAL | Status: AC
  Administered 2015-01-18: 500 mg via ORAL
  Filled 2015-01-18: qty 2

## 2015-01-18 NOTE — ED Notes (Signed)
Pt c/o rt sided headache with associated ear pain x 2 days. Has taken Bayer with no relief.

## 2015-01-18 NOTE — Discharge Instructions (Signed)

## 2015-01-18 NOTE — ED Provider Notes (Signed)
CSN: [id number]     Arrival date & time 01/18/15  0046 History  This chart was scribed for Ripley Fraise, MD by Rayfield Citizen, ED Scribe. This patient was seen in room D34C/D34C and the patient's care was started at 2:02 AM.    Chief Complaint  Patient presents with  . Headache   Patient is a 44 y.o. female presenting with headaches. The history is provided by the patient. No language interpreter was used.  Headache Location: Right side of head. Quality:  Dull Radiates to:  Does not radiate Severity currently:  Unable to specify Severity at highest:  Unable to specify Onset quality:  Gradual Duration:  2 days Timing:  Constant Progression:  Worsening Chronicity:  New Relieved by:  Nothing Exacerbated by: Chewing. Ineffective treatments: Bayer aspirin. Associated symptoms: ear pain and facial pain   Associated symptoms: no abdominal pain, no blurred vision, no fever, no numbness, no vomiting and no weakness      HPI Comments: GRACELEE STEMMLER is an otherwise healthy 44 y.o. female who presents to the Emergency Department complaining of 2 days of gradual onset right-sided facial pain and headache. Patient explains the symptoms originated in the right side of her jaw, radiating through the right side of her head including her right ear. She states she is unable to eat due to pain when chewing. Patient reports she has taken Bayer without relief. She denies fevers, vomiting, vision or hearing problems, weakness in the extremities, chest pain, abdominal pain, oral swelling, facial numbness. She denies prior history of stroke.   History reviewed. No pertinent past medical history. History reviewed. No pertinent past surgical history. No family history on file. History  Substance Use Topics  . Smoking status: Never Smoker   . Smokeless tobacco: Not on file  . Alcohol Use: Yes     Comment: social   OB History    No data available     Review of Systems  Constitutional: Negative for  fever.  HENT: Positive for dental problem and ear pain. Negative for facial swelling.   Eyes: Negative for blurred vision and visual disturbance.  Cardiovascular: Negative for chest pain.  Gastrointestinal: Negative for vomiting and abdominal pain.  Neurological: Positive for headaches. Negative for weakness and numbness.  All other systems reviewed and are negative.  Allergies  Review of patient's allergies indicates no known allergies.  Home Medications   Prior to Admission medications   Medication Sig Start Date End Date Taking? Authorizing Provider  docusate sodium (COLACE) 100 MG capsule Take 1 capsule (100 mg total) by mouth every 12 (twelve) hours. 05/30/14   Elnora Morrison, MD  ibuprofen (ADVIL,MOTRIN) 200 MG tablet Take 200 mg by mouth every 6 (six) hours as needed for moderate pain.    Historical Provider, MD   BP 135/87 mmHg  Pulse 76  Temp(Src) 99 F (37.2 C) (Oral)  Resp 18  Ht 5\' 1"  (1.549 m)  Wt 174 lb (78.926 kg)  BMI 32.89 kg/m2  SpO2 100%  LMP 01/05/2015 Physical Exam  Nursing note and vitals reviewed.   CONSTITUTIONAL: Well developed/well nourished HEAD: Normocephalic/atraumatic EYES: EOMI/PERRL, no nystagmus, no ptosis ENMT: Mucous membranes moist; poor dentition, diffuse tenderness to lower gingiva without abscess, no trismus Right TM clear and intact NECK: supple no meningeal signs, no bruits SPINE/BACK:entire spine nontender CV: S1/S2 noted, no murmurs/rubs/gallops noted LUNGS: Lungs are clear to auscultation bilaterally, no apparent distress ABDOMEN: soft, nontender, no rebound or guarding NEURO:Awake/alert, facies symmetric, no arm or leg  drift is noted EXTREMITIES: pulses normal, full ROM SKIN: warm, color normal PSYCH: no abnormalities of mood noted, alert and oriented to situation  ED Course  Procedures   DIAGNOSTIC STUDIES: Oxygen Saturation is 100% on RA, adequate by my interpretation.    COORDINATION OF CARE 2:07 AM Discussed treatment  plan with pt at bedside and pt agreed to plan. Pt with dental origin of her pain PCN ordered  Referred to dentistry  MDM   Final diagnoses:  Pain, dental    Nursing notes including past medical history and social history reviewed and considered in documentation  I personally performed the services described in this documentation, which was scribed in my presence. The recorded information has been reviewed and is accurate.        Ripley Fraise, MD 01/18/15 [phone]

## 2015-06-19 DIAGNOSIS — Z01419 Encounter for gynecological examination (general) (routine) without abnormal findings: Secondary | ICD-10-CM | POA: Diagnosis not present

## 2015-06-19 DIAGNOSIS — Z1151 Encounter for screening for human papillomavirus (HPV): Secondary | ICD-10-CM | POA: Diagnosis not present

## 2015-06-19 DIAGNOSIS — Z1231 Encounter for screening mammogram for malignant neoplasm of breast: Secondary | ICD-10-CM | POA: Diagnosis not present

## 2015-06-19 DIAGNOSIS — N921 Excessive and frequent menstruation with irregular cycle: Secondary | ICD-10-CM | POA: Diagnosis not present

## 2015-06-19 DIAGNOSIS — Z01411 Encounter for gynecological examination (general) (routine) with abnormal findings: Secondary | ICD-10-CM | POA: Diagnosis not present

## 2015-08-13 DIAGNOSIS — Z30011 Encounter for initial prescription of contraceptive pills: Secondary | ICD-10-CM | POA: Diagnosis not present

## 2015-08-13 DIAGNOSIS — R7301 Impaired fasting glucose: Secondary | ICD-10-CM | POA: Diagnosis not present

## 2015-08-13 DIAGNOSIS — Z0000 Encounter for general adult medical examination without abnormal findings: Secondary | ICD-10-CM | POA: Diagnosis not present

## 2015-08-13 DIAGNOSIS — Z130 Encounter for screening for diseases of the blood and blood-forming organs and certain disorders involving the immune mechanism: Secondary | ICD-10-CM | POA: Diagnosis not present

## 2015-08-13 DIAGNOSIS — Z13220 Encounter for screening for lipoid disorders: Secondary | ICD-10-CM | POA: Diagnosis not present

## 2015-08-13 DIAGNOSIS — N926 Irregular menstruation, unspecified: Secondary | ICD-10-CM | POA: Diagnosis not present

## 2015-08-13 DIAGNOSIS — Z1389 Encounter for screening for other disorder: Secondary | ICD-10-CM | POA: Diagnosis not present

## 2015-08-13 DIAGNOSIS — Z1329 Encounter for screening for other suspected endocrine disorder: Secondary | ICD-10-CM | POA: Diagnosis not present

## 2015-10-07 ENCOUNTER — Encounter (HOSPITAL_COMMUNITY): Payer: Self-pay | Admitting: *Deleted

## 2015-10-07 DIAGNOSIS — R42 Dizziness and giddiness: Secondary | ICD-10-CM | POA: Insufficient documentation

## 2015-10-07 DIAGNOSIS — R51 Headache: Secondary | ICD-10-CM | POA: Insufficient documentation

## 2015-10-07 DIAGNOSIS — R200 Anesthesia of skin: Secondary | ICD-10-CM | POA: Diagnosis not present

## 2015-10-07 DIAGNOSIS — F419 Anxiety disorder, unspecified: Secondary | ICD-10-CM | POA: Diagnosis not present

## 2015-10-07 DIAGNOSIS — R079 Chest pain, unspecified: Secondary | ICD-10-CM | POA: Diagnosis not present

## 2015-10-07 NOTE — ED Notes (Signed)
The pt is c/o a headache dizziness for 2-3 days  Earlier today she felt like she had chest pain and lt arm  With some lt face numbness.  She is anxious.  No previous history  lmp jan 5th

## 2015-10-08 ENCOUNTER — Emergency Department (HOSPITAL_COMMUNITY)
Admission: EM | Admit: 2015-10-08 | Discharge: 2015-10-08 | Discharge status: Against Medical Advice | Payer: Commercial Managed Care - HMO | Attending: Emergency Medicine | Admitting: Emergency Medicine

## 2015-10-08 NOTE — ED Notes (Addendum)
Pt states that she does not with to stay any longer during her vital sign recheck. States she will follow up with her PCP.

## 2015-12-05 DIAGNOSIS — F339 Major depressive disorder, recurrent, unspecified: Secondary | ICD-10-CM | POA: Diagnosis not present

## 2015-12-23 ENCOUNTER — Encounter (HOSPITAL_COMMUNITY): Payer: Self-pay | Admitting: *Deleted

## 2015-12-23 ENCOUNTER — Emergency Department (HOSPITAL_COMMUNITY)
Admission: EM | Admit: 2015-12-23 | Discharge: 2015-12-23 | Disposition: A | Discharge status: Home / Self Care | Payer: Commercial Managed Care - HMO | Attending: Emergency Medicine | Admitting: Emergency Medicine

## 2015-12-23 DIAGNOSIS — N760 Acute vaginitis: Secondary | ICD-10-CM | POA: Diagnosis not present

## 2015-12-23 DIAGNOSIS — R1030 Lower abdominal pain, unspecified: Secondary | ICD-10-CM | POA: Diagnosis present

## 2015-12-23 DIAGNOSIS — Z79899 Other long term (current) drug therapy: Secondary | ICD-10-CM | POA: Insufficient documentation

## 2015-12-23 DIAGNOSIS — Z3202 Encounter for pregnancy test, result negative: Secondary | ICD-10-CM | POA: Insufficient documentation

## 2015-12-23 DIAGNOSIS — R102 Pelvic and perineal pain: Secondary | ICD-10-CM | POA: Diagnosis not present

## 2015-12-23 DIAGNOSIS — B9689 Other specified bacterial agents as the cause of diseases classified elsewhere: Secondary | ICD-10-CM

## 2015-12-23 LAB — URINALYSIS, ROUTINE W REFLEX MICROSCOPIC
BILIRUBIN URINE: NEGATIVE
Glucose, UA: NEGATIVE mg/dL
Ketones, ur: NEGATIVE mg/dL
NITRITE: NEGATIVE
PH: 6 (ref 5.0–8.0)
PROTEIN: NEGATIVE mg/dL
Specific Gravity, Urine: 1.036 — ABNORMAL HIGH (ref 1.005–1.030)

## 2015-12-23 LAB — BASIC METABOLIC PANEL
Anion gap: 9 (ref 5–15)
BUN: 13 mg/dL (ref 6–20)
CALCIUM: 9.1 mg/dL (ref 8.9–10.3)
CO2: 23 mmol/L (ref 22–32)
Chloride: 104 mmol/L (ref 101–111)
Creatinine, Ser: 0.77 mg/dL (ref 0.44–1.00)
GFR calc non Af Amer: >60 mL/min (ref >60)
Glucose, Bld: 133 mg/dL — ABNORMAL HIGH (ref 65–99)
Potassium: 3.8 mmol/L (ref 3.5–5.1)
Sodium: 136 mmol/L (ref 135–145)

## 2015-12-23 LAB — POC URINE PREG, ED: Preg Test, Ur: NEGATIVE

## 2015-12-23 LAB — CBC WITH DIFFERENTIAL/PLATELET
BASOS PCT: 0 %
Basophils Absolute: 0 10*3/uL (ref 0.0–0.1)
Eosinophils Absolute: 0 10*3/uL (ref 0.0–0.7)
Eosinophils Relative: 0 %
HCT: 35.1 % — ABNORMAL LOW (ref 36.0–46.0)
HEMOGLOBIN: 11.1 g/dL — AB (ref 12.0–15.0)
Lymphocytes Relative: 28 %
Lymphs Abs: 1 10*3/uL (ref 0.7–4.0)
MCH: 26.1 pg (ref 26.0–34.0)
MCHC: 31.6 g/dL (ref 30.0–36.0)
MCV: 82.4 fL (ref 78.0–100.0)
MONO ABS: 0.3 10*3/uL (ref 0.1–1.0)
Monocytes Relative: 7 %
NEUTROS PCT: 65 %
Neutro Abs: 2.3 10*3/uL (ref 1.7–7.7)
Platelets: 190 10*3/uL (ref 150–400)
RBC: 4.26 MIL/uL (ref 3.87–5.11)
RDW: 13.4 % (ref 11.5–15.5)
WBC: 3.5 10*3/uL — ABNORMAL LOW (ref 4.0–10.5)

## 2015-12-23 LAB — URINE MICROSCOPIC-ADD ON

## 2015-12-23 LAB — WET PREP, GENITAL
SPERM: NONE SEEN
Trich, Wet Prep: NONE SEEN
Yeast Wet Prep HPF POC: NONE SEEN

## 2015-12-23 MED ORDER — IBUPROFEN 800 MG PO TABS: 800.0000 mg | ORAL_TABLET | Freq: Three times a day (TID) | ORAL | Status: DC | PRN

## 2015-12-23 MED ORDER — ONDANSETRON 4 MG PO TBDP
8.0000 mg | ORAL_TABLET | Freq: Once | ORAL | Status: AC
  Administered 2015-12-23: 8 mg via ORAL
  Filled 2015-12-23: qty 2

## 2015-12-23 MED ORDER — AZITHROMYCIN 250 MG PO TABS
1000.0000 mg | ORAL_TABLET | Freq: Once | ORAL | Status: AC
  Administered 2015-12-23: 1000 mg via ORAL
  Filled 2015-12-23: qty 4

## 2015-12-23 MED ORDER — LIDOCAINE HCL (PF) 1 % IJ SOLN
2.0000 mL | Freq: Once | INTRAMUSCULAR | Status: AC
  Administered 2015-12-23: 2 mL
  Filled 2015-12-23: qty 5

## 2015-12-23 MED ORDER — METRONIDAZOLE 500 MG PO TABS: 500.0000 mg | ORAL_TABLET | Freq: Two times a day (BID) | ORAL | Status: DC

## 2015-12-23 MED ORDER — CEFTRIAXONE SODIUM 250 MG IJ SOLR
250.0000 mg | Freq: Once | INTRAMUSCULAR | Status: AC
  Administered 2015-12-23: 250 mg via INTRAMUSCULAR
  Filled 2015-12-23: qty 250

## 2015-12-23 NOTE — ED Notes (Signed)
Pt states sharp pain 6/10 in lower abdomin and lower back that feels crampy since yesterday.  Pt states brown discharge spotting x 1 week that is not her normal.  Pt states unprotected sex march 30, LMP April 1, states just spotting but not like normal period.  Pt not in distress.

## 2015-12-23 NOTE — ED Provider Notes (Signed)
CSN: [id number]     Arrival date & time 12/23/15  1054 History   First MD Initiated Contact with Patient 12/23/15 1106     Chief Complaint  Patient presents with  . Abdominal Pain     (Consider location/radiation/quality/duration/timing/severity/associated sxs/prior Treatment) HPI   Patient presents with lower abdominal cramping and sharp pain, low back pain with dark brown vaginal discharge.  LNMP March 7, some spotting in April but not a normal period.  Has been nauseated x 2 weeks.  States there is a chance she could be pregnant.  G6P4, 1 stillbirth, 1 abortion. Denies fevers, vomiting, change in bowel habits, urinary symptoms.    No prior abdominal surgeries.  Obgyn [facility].    History reviewed. No pertinent past medical history. History reviewed. No pertinent past surgical history. History reviewed. No pertinent family history. Social History  Substance Use Topics  . Smoking status: Never Smoker   . Smokeless tobacco: None  . Alcohol Use: Yes     Comment: social   OB History    No data available     Review of Systems  All other systems reviewed and are negative.     Allergies  Review of patient's allergies indicates no known allergies.  Home Medications   Prior to Admission medications   Medication Sig Start Date End Date Taking? Authorizing Provider  docusate sodium (COLACE) 100 MG capsule Take 1 capsule (100 mg total) by mouth every 12 (twelve) hours. 05/30/14   Elnora Morrison, MD  ibuprofen (ADVIL,MOTRIN) 200 MG tablet Take 200 mg by mouth every 6 (six) hours as needed for moderate pain.    Historical Provider, MD   BP 130/98 mmHg  Pulse 68  Temp(Src) 98.4 F (36.9 C) (Oral)  Resp 16  Ht 5\' 1"  (1.549 m)  Wt 77.111 kg  BMI 32.14 kg/m2  SpO2 100%  LMP 12/01/2015 Physical Exam  Constitutional: She appears well-developed and well-nourished. No distress.  HENT:  Head: Normocephalic and atraumatic.  Neck: Neck supple.  Cardiovascular: Normal rate  and regular rhythm.   Pulmonary/Chest: Effort normal and breath sounds normal. No respiratory distress. She has no wheezes. She has no rales.  Abdominal: Soft. Bowel sounds are normal. She exhibits no distension and no mass. There is tenderness. There is no rebound and no guarding.  Diffuse tenderness across lower abdomen  Genitourinary: Uterus is tender. Cervix exhibits no motion tenderness. Right adnexum displays no mass, no tenderness and no fullness. Left adnexum displays no mass, no tenderness and no fullness. No erythema, tenderness or bleeding in the vagina. No foreign body around the vagina. No signs of injury around the vagina. Vaginal discharge found.  Moderate to large amount of thick white malodorous discharge.  Midline tenderness of bimanual exam without cervical motion tenderness.    Neurological: She is alert.  Skin: She is not diaphoretic.  Nursing note and vitals reviewed.   ED Course  Procedures (including critical care time) Labs Review Labs Reviewed  WET PREP, GENITAL - Abnormal; Notable for the following:    Clue Cells Wet Prep HPF POC PRESENT (*)    WBC, Wet Prep HPF POC MODERATE (*)    All other components within normal limits  BASIC METABOLIC PANEL - Abnormal; Notable for the following:    Glucose, Bld 133 (*)    All other components within normal limits  CBC WITH DIFFERENTIAL/PLATELET - Abnormal; Notable for the following:    WBC 3.5 (*)    Hemoglobin 11.1 (*)  HCT 35.1 (*)    All other components within normal limits  URINALYSIS, ROUTINE W REFLEX MICROSCOPIC (NOT AT [facility]) - Abnormal; Notable for the following:    APPearance TURBID (*)    Specific Gravity, Urine 1.036 (*)    Hgb urine dipstick TRACE (*)    Leukocytes, UA TRACE (*)    All other components within normal limits  URINE MICROSCOPIC-ADD ON - Abnormal; Notable for the following:    Squamous Epithelial / LPF 6-30 (*)    Bacteria, UA FEW (*)    All other components within normal limits  RPR   HIV ANTIBODY (ROUTINE TESTING)  POC URINE PREG, ED  GC/CHLAMYDIA PROBE AMP ([place]) NOT AT [facility]    Imaging Review No results found. I have personally reviewed and evaluated these images and lab results as part of my medical decision-making.   EKG Interpretation None      MDM   Final diagnoses:  Bacterial vaginosis  Pelvic pain in female   Afebrile nontoxic patient with lower abdominal pain, brown vaginal discharge, nausea.  Labs unremarkable (chronic anemia, slightly low WBC which she has had previously)  UA likely contaminated from large amount of vaginal discharge, pt has no urinary symptoms.  Pelvic exam large amount of malodorous white discharge.  Wet prep with clue cells, WBC.   Upreg negative.   STD testing pending; discussed with patient and she chose to be treated for GC/Chlam in ED and will check results on My Chart.  Discussed return precautions.  Discussed result, findings, treatment, and follow up  with patient.  Pt given return precautions.  Pt verbalizes understanding and agrees with plan.       Clayton Bibles, PA-C 12/23/15 [place], MD 12/24/15 [phone]

## 2015-12-23 NOTE — ED Notes (Signed)
Pt  Placed into gown and on to monitor upon arrival to room. Pt monitored by blood pressure and pulse ox.

## 2015-12-23 NOTE — Discharge Instructions (Signed)
Read the information below.  Use the prescribed medication as directed.  Please discuss all new medications with your pharmacist.  You may return to the Emergency Department at any time for worsening condition or any new symptoms that concern you.   Please do not drink alcohol while taking the antibiotics. If you develop high fevers, worsening abdominal pain, uncontrolled vomiting, or are unable to tolerate fluids by mouth, return to the ER for a recheck.     Bacterial Vaginosis Bacterial vaginosis is a vaginal infection that occurs when the normal balance of bacteria in the vagina is disrupted. It results from an overgrowth of certain bacteria. This is the most common vaginal infection in women of childbearing age. Treatment is important to prevent complications, especially in pregnant women, as it can cause a premature delivery. CAUSES  Bacterial vaginosis is caused by an increase in harmful bacteria that are normally present in smaller amounts in the vagina. Several different kinds of bacteria can cause bacterial vaginosis. However, the reason that the condition develops is not fully understood. RISK FACTORS Certain activities or behaviors can put you at an increased risk of developing bacterial vaginosis, including:  Having a new sex partner or multiple sex partners.  Douching.  Using an intrauterine device (IUD) for contraception. Women do not get bacterial vaginosis from toilet seats, bedding, swimming pools, or contact with objects around them. SIGNS AND SYMPTOMS  Some women with bacterial vaginosis have no signs or symptoms. Common symptoms include:  Grey vaginal discharge.  A fishlike odor with discharge, especially after sexual intercourse.  Itching or burning of the vagina and vulva.  Burning or pain with urination. DIAGNOSIS  Your health care provider will take a medical history and examine the vagina for signs of bacterial vaginosis. A sample of vaginal fluid may be taken.  Your health care provider will look at this sample under a microscope to check for bacteria and abnormal cells. A vaginal pH test may also be done.  TREATMENT  Bacterial vaginosis may be treated with antibiotic medicines. These may be given in the form of a pill or a vaginal cream. A second round of antibiotics may be prescribed if the condition comes back after treatment. Because bacterial vaginosis increases your risk for sexually transmitted diseases, getting treated can help reduce your risk for chlamydia, gonorrhea, HIV, and herpes. HOME CARE INSTRUCTIONS   Only take over-the-counter or prescription medicines as directed by your health care provider.  If antibiotic medicine was prescribed, take it as directed. Make sure you finish it even if you start to feel better.  Tell all sexual partners that you have a vaginal infection. They should see their health care provider and be treated if they have problems, such as a mild rash or itching.  During treatment, it is important that you follow these instructions:  Avoid sexual activity or use condoms correctly.  Do not douche.  Avoid alcohol as directed by your health care provider.  Avoid breastfeeding as directed by your health care provider. SEEK MEDICAL CARE IF:   Your symptoms are not improving after 3 days of treatment.  You have increased discharge or pain.  You have a fever. MAKE SURE YOU:   Understand these instructions.  Will watch your condition.  Will get help right away if you are not doing well or get worse. FOR MORE INFORMATION  Centers for Disease Control and Prevention, Division of STD Prevention: [employer] American Sexual Health Association (ASHA): www.ashastd.org    This information is  not intended to replace advice given to you by your health care provider. Make sure you discuss any questions you have with your health care provider.   Document Released: 08/18/2005 Document Revised: 09/08/2014 Document  Reviewed: 03/30/2013 Elsevier Interactive Patient Education 2016 [employer]  Abdominal Pain, Adult Many things can cause abdominal pain. Usually, abdominal pain is not caused by a disease and will improve without treatment. It can often be observed and treated at home. Your health care provider will do a physical exam and possibly order blood tests and X-rays to help determine the seriousness of your pain. However, in many cases, more time must pass before a clear cause of the pain can be found. Before that point, your health care provider may not know if you need more testing or further treatment. HOME CARE INSTRUCTIONS Monitor your abdominal pain for any changes. The following actions may help to alleviate any discomfort you are experiencing:  Only take over-the-counter or prescription medicines as directed by your health care provider.  Do not take laxatives unless directed to do so by your health care provider.  Try a clear liquid diet (broth, tea, or water) as directed by your health care provider. Slowly move to a bland diet as tolerated. SEEK MEDICAL CARE IF:  You have unexplained abdominal pain.  You have abdominal pain associated with nausea or diarrhea.  You have pain when you urinate or have a bowel movement.  You experience abdominal pain that wakes you in the night.  You have abdominal pain that is worsened or improved by eating food.  You have abdominal pain that is worsened with eating fatty foods.  You have a fever. SEEK IMMEDIATE MEDICAL CARE IF:  Your pain does not go away within 2 hours.  You keep throwing up (vomiting).  Your pain is felt only in portions of the abdomen, such as the right side or the left lower portion of the abdomen.  You pass bloody or black tarry stools. MAKE SURE YOU:  Understand these instructions.  Will watch your condition.  Will get help right away if you are not doing well or get worse.   This information is not intended  to replace advice given to you by your health care provider. Make sure you discuss any questions you have with your health care provider.   Document Released: 05/28/2005 Document Revised: 05/09/2015 Document Reviewed: 04/27/2013 Elsevier Interactive Patient Education 2016 [place].  Antibiotic Medicine Antibiotic medicines are used to treat infections caused by bacteria. They work by injuring or killing the bacteria that is making you sick. HOW IS AN ANTIBIOTIC CHOSEN? An antibiotic is chosen based on many factors. To help your health care provider choose one for you, tell your health care provider if:  You have any allergies.  You are pregnant or plan to get pregnant.  You are breastfeeding.  You are taking any medicines. These include over-the-counter medicines, prescription medicines, and herbal remedies.  You have a medical condition or problem you have not already discussed. Your health care provider will also consider:  How often the medicine has to be taken.  Common side effects of the medicine.  The cost of the medicine.  The taste of the medicine. If you have questions about why an antibiotic was chosen, make sure to ask. FOR HOW LONG SHOULD I TAKE MY ANTIBIOTIC? Continue to take your antibiotic for as long as told by your health care provider. Do not stop taking it when you feel better. If  you stop taking it too soon:  You may start to feel sick again.  Your infection may become harder to treat.  Complications may develop. WHAT IF I MISS A DOSE? Try not to miss any doses of medicine. If you miss a dose, take it as soon as possible. However, if it is almost time for the next dose:  If you are taking 2 doses per day, take the missed dose and the next dose 5 to 6 hours apart.  If you are taking 3 or more doses per day, take the missed dose and the next dose 2 to 4 hours apart, then go back to the normal schedule. If you cannot make up a missed dose, take the  next scheduled dose on time. Then take the missed dose after you have taken all the doses as recommended by your health care provider, as if you had one more dose left. DO ANTIBIOTICS AFFECT BIRTH CONTROL? Birth control pills may not work while you are on antibiotics. If you are taking birth control pills, continue taking them as usual and use a second form of birth control, such as a condom, to avoid unwanted pregnancy. Continue using the second form of birth control until you are finished with your current 1 month cycle of birth control pills. OTHER INFORMATION  If there is any medicine left over, throw it away.  Never take someone else's antibiotics.  Never take leftover antibiotics. SEEK MEDICAL CARE IF:  You get worse.  You do not feel better within a few days of starting the antibiotic medicine.  You vomit.  White patches appear in your mouth.  You have new joint pain that begins after starting the antibiotic.  You have new muscle aches that begin after starting the antibiotic.  You had a fever before starting the antibiotic and it returns.  You have any symptoms of an allergic reaction, such as an itchy rash. If this happens, stop taking the antibiotic. SEEK IMMEDIATE MEDICAL CARE IF:  Your urine turns dark or becomes blood-colored.  Your skin turns yellow.  You bruise or bleed easily.  You have severe diarrhea and abdominal cramps.  You have a severe headache.  You have signs of a severe allergic reaction, such as:  Trouble breathing.  Wheezing.  Swelling of the lips, tongue, or face.  Fainting.  Blisters on the skin or in the mouth. If you have signs of a severe allergic reaction, stop taking the antibiotic right away.   This information is not intended to replace advice given to you by your health care provider. Make sure you discuss any questions you have with your health care provider.   Document Released: 04/30/2004 Document Revised: 05/09/2015  Document Reviewed: 01/03/2015 Elsevier Interactive Patient Education [employer].

## 2015-12-23 NOTE — ED Notes (Signed)
Pt ambulates independently and with steady gait at time of discharge. Discharge instructions and follow up information reviewed with patient. No other questions or concerns voiced at this time.  

## 2015-12-23 NOTE — ED Notes (Signed)
Pt came to nurse first asking if she left her cell phone in room 38. Called RN for that assignment who reports no phone was in the room.

## 2015-12-24 LAB — HIV ANTIBODY (ROUTINE TESTING W REFLEX): HIV Screen 4th Generation wRfx: NONREACTIVE

## 2015-12-24 LAB — GC/CHLAMYDIA PROBE AMP (~~LOC~~) NOT AT ARMC
Chlamydia: NEGATIVE
NEISSERIA GONORRHEA: NEGATIVE

## 2015-12-24 LAB — RPR: RPR Ser Ql: NONREACTIVE

## 2016-09-17 IMAGING — CT CT ABD-PELV W/ CM
2 of 5 series · 16 of 46 positions shown, 18 images · IV contrast (Omni 300)
Comparison: None.

CLINICAL DATA: Right lower quadrant pain, vomiting, evaluate for
kidney stone versus appendicitis

EXAM:
CT ABDOMEN AND PELVIS WITH CONTRAST
TECHNIQUE: Multidetector CT imaging of the abdomen and pelvis was performed
using the standard protocol following bolus administration of
intravenous contrast.
CONTRAST:  100mL OMNIPAQUE IOHEXOL 300 MG/ML  SOLN

[Series 2: abd/ pelvis 5.0 i30f 1 · axial · 0.68mm/px · z∈[-418,-8]mm · 13 of 92 slices shown, 15 images]
[im 5/92  soft-tissue]
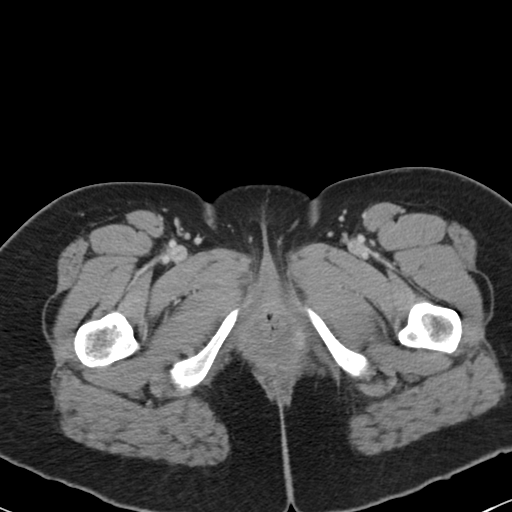
[im 5/92  bone]
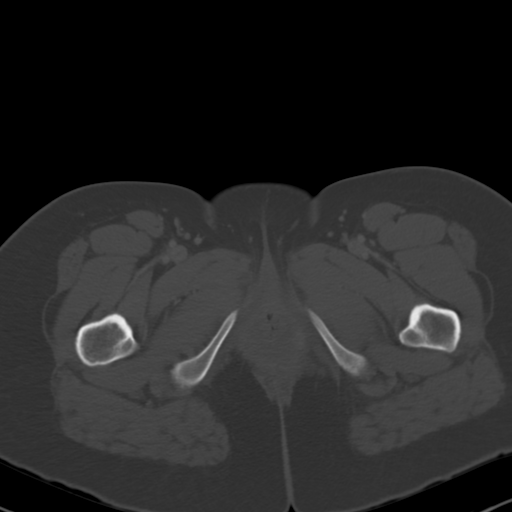
[im 15/92  soft-tissue]
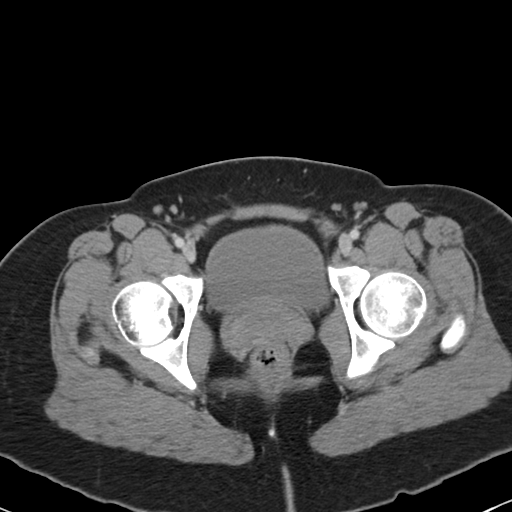
[im 20/92  soft-tissue]
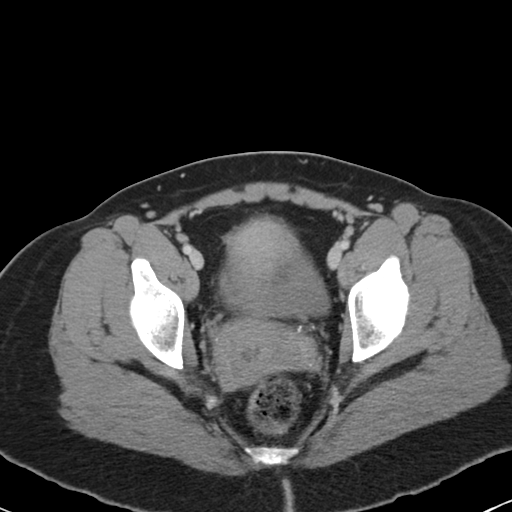
[im 24/92  soft-tissue]
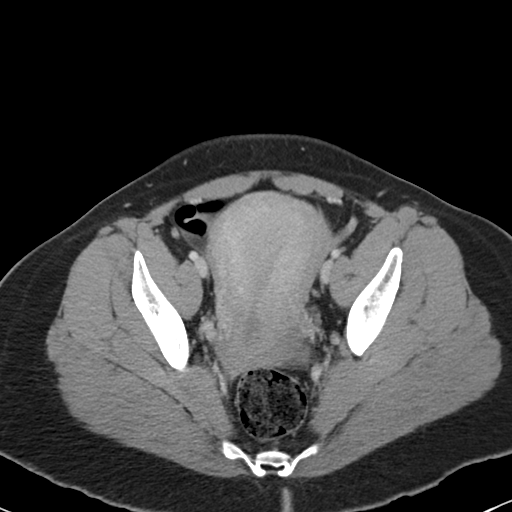
[im 34/92  soft-tissue]
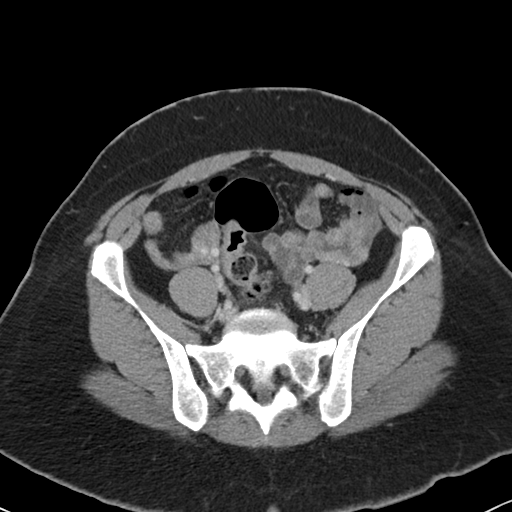
[im 39/92  soft-tissue]
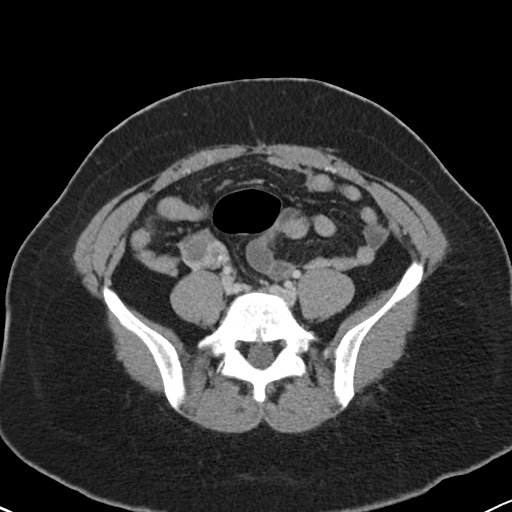
[im 48/92  soft-tissue]
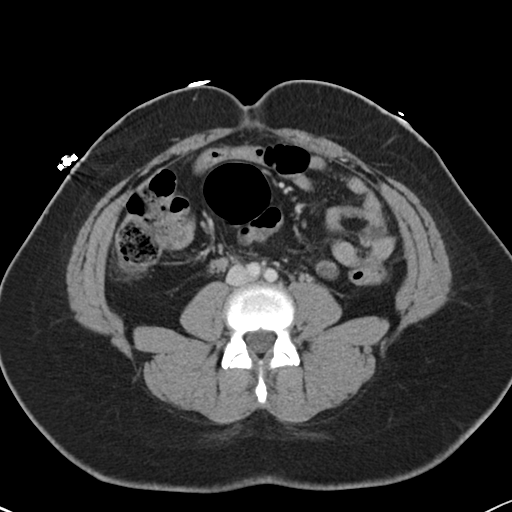
[im 53/92  soft-tissue]
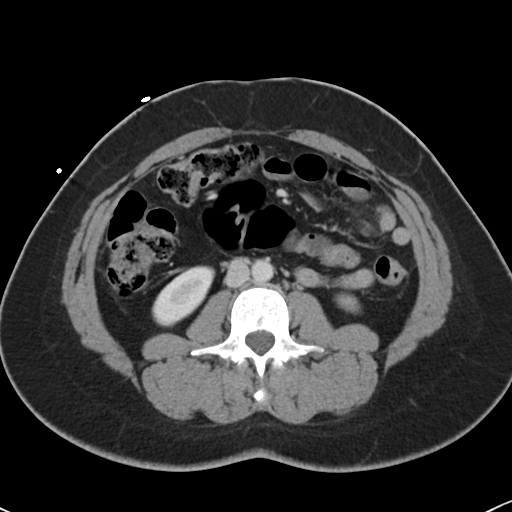
[im 58/92  soft-tissue]
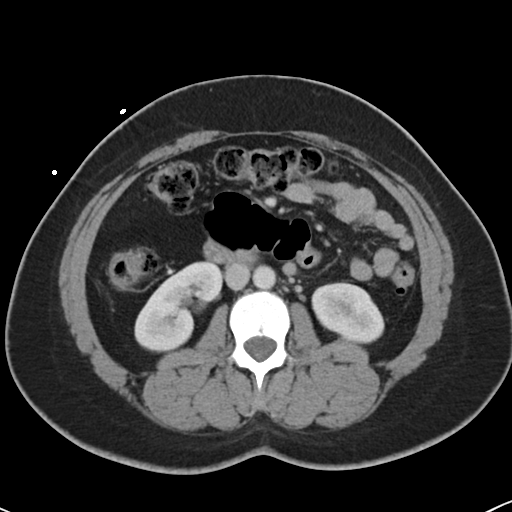
[im 58/92  bone]
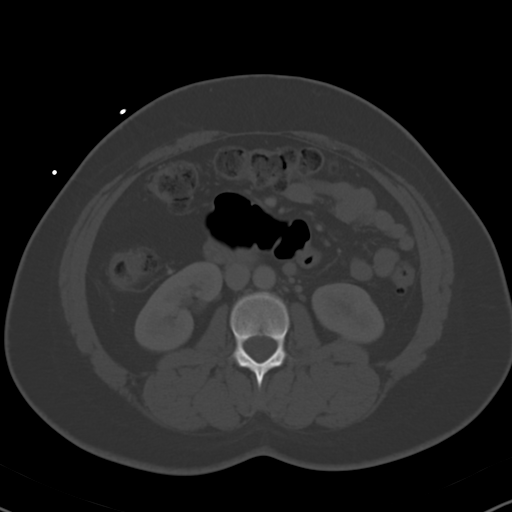
[im 68/92  soft-tissue]
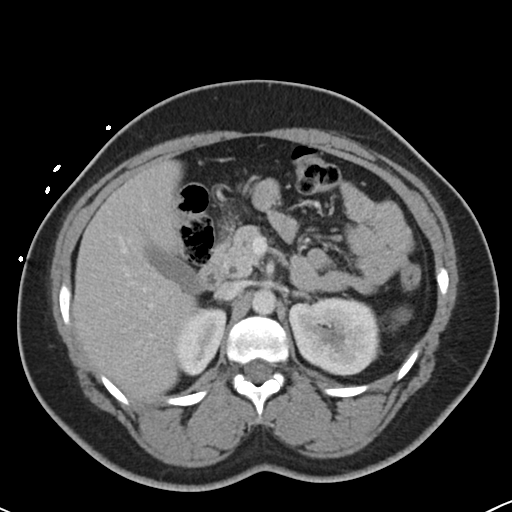
[im 72/92  soft-tissue]
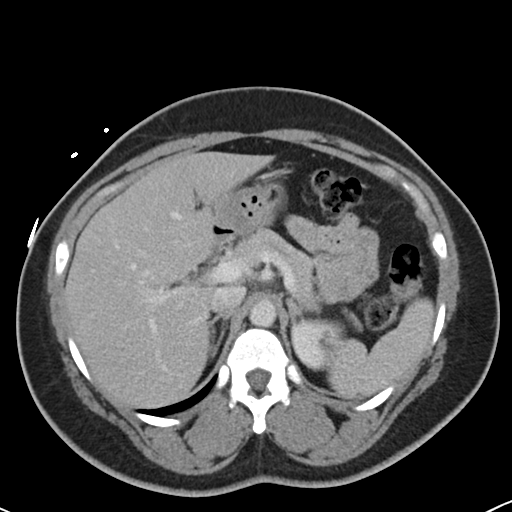
[im 77/92  soft-tissue]
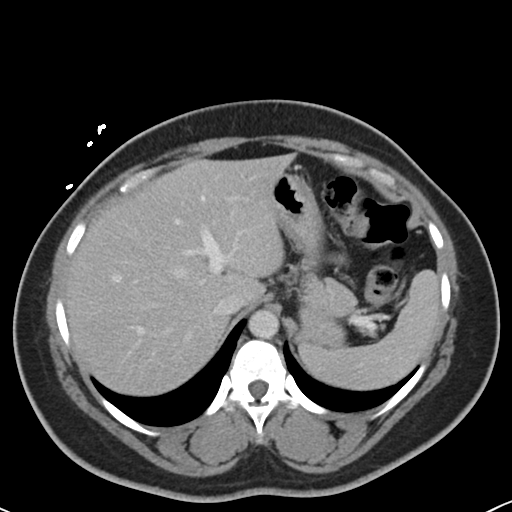
[im 87/92  soft-tissue]
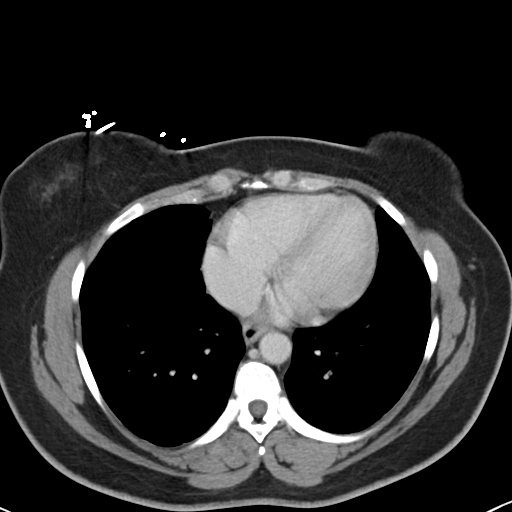

[Series 5: coronals · coronal · 0.76mm/px · 3 of 144 slices shown]
[im 48/144  soft-tissue]
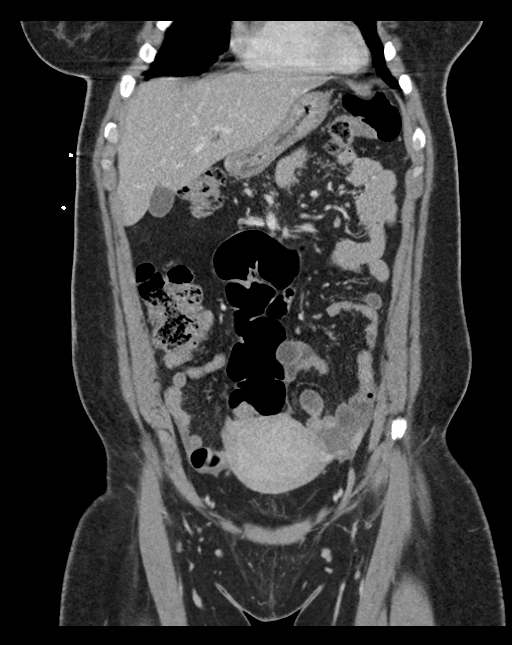
[im 64/144  soft-tissue]
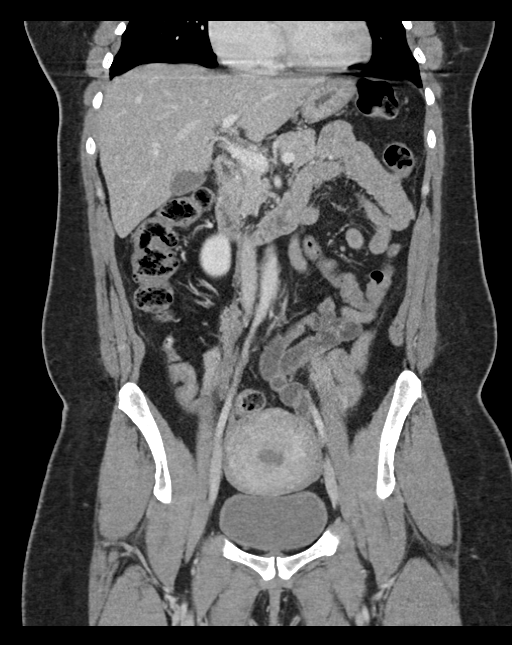
[im 80/144  soft-tissue]
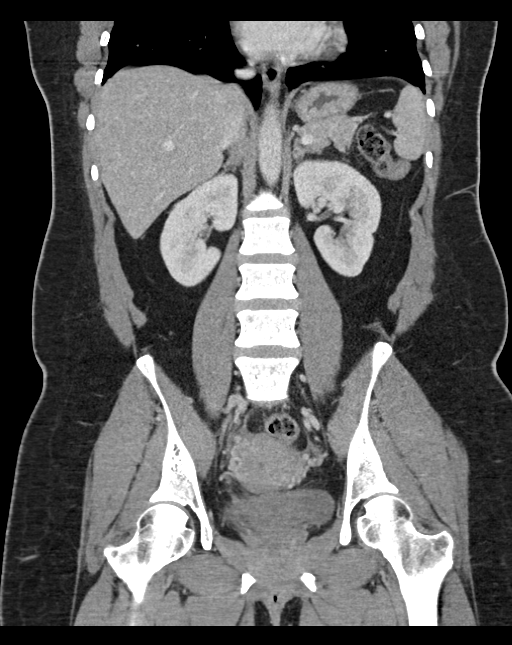

[16 of 46 positions shown; findings below may reference images not displayed]

FINDINGS: Lower chest:  Lung bases are clear.

Hepatobiliary: Liver is within normal limits.

Gallbladder is unremarkable. No intrahepatic or extrahepatic ductal
dilatation.

Spleen: Within normal limits.

Pancreas: Within normal limits.

Stomach/Bowel: Stomach is unremarkable.

No evidence of bowel obstruction.

Normal appendix.

Mild to moderate stool in the right colon and rectum.

Adrenals/urinary tract: Adrenal glands are unremarkable.

4 mm nonobstructing interpolar left renal calculus (series 2/ image
31). Right kidney is unremarkable. No hydronephrosis.

No ureteral or bladder calculi.

Bladder is within normal limits.

Vascular/Lymphatic: No evidence of abdominal aortic aneurysm.

No suspicious abdominopelvic lymphadenopathy.

Reproductive: Uterus and bilateral ovaries are within normal limits.

Musculoskeletal: Visualized osseous structures are within normal
limits.

Other: Trace pelvic ascites, likely physiologic.
IMPRESSION: No evidence of bowel obstruction.  Normal appendix.

4 mm nonobstructing interpolar left renal calculus. No ureteral or
bladder calculi. No hydronephrosis.

Mild to moderate colonic stool burden, raising the possibility of
constipation.

## 2016-12-29 ENCOUNTER — Encounter (HOSPITAL_COMMUNITY): Payer: Self-pay

## 2016-12-29 ENCOUNTER — Emergency Department (HOSPITAL_COMMUNITY)
Admission: EM | Admit: 2016-12-29 | Discharge: 2016-12-29 | Disposition: A | Discharge status: Home / Self Care | Payer: Medicare HMO | Attending: Emergency Medicine | Admitting: Emergency Medicine

## 2016-12-29 DIAGNOSIS — K047 Periapical abscess without sinus: Secondary | ICD-10-CM | POA: Diagnosis not present

## 2016-12-29 DIAGNOSIS — R220 Localized swelling, mass and lump, head: Secondary | ICD-10-CM | POA: Diagnosis present

## 2016-12-29 MED ORDER — HYDROCODONE-ACETAMINOPHEN 5-325 MG PO TABS: 2.0000 | ORAL_TABLET | ORAL | 0 refills | Status: DC | PRN

## 2016-12-29 MED ORDER — IBUPROFEN 600 MG PO TABS: 600.0000 mg | ORAL_TABLET | Freq: Four times a day (QID) | ORAL | 0 refills | Status: DC | PRN

## 2016-12-29 MED ORDER — CLINDAMYCIN HCL 300 MG PO CAPS: 300.0000 mg | ORAL_CAPSULE | Freq: Three times a day (TID) | ORAL | 0 refills | Status: DC

## 2016-12-29 MED ORDER — BUPIVACAINE-EPINEPHRINE (PF) 0.5% -1:200000 IJ SOLN
1.8000 mL | Freq: Once | INTRAMUSCULAR | Status: AC
  Administered 2016-12-29: 1.8 mL
  Filled 2016-12-29: qty 1.8

## 2016-12-29 NOTE — ED Provider Notes (Signed)
[facility] DEPT Provider Note   CSN: [id number] Arrival date & time: 12/29/16  0759     History   Chief Complaint Chief Complaint  Patient presents with  . Facial Swelling    HPI Maria Carter is a 46 y.o. female who presents with dental pain and left sided facial swelling. She states that yesterday she noticed swelling on the left side her face. She states she is also having right lower dental pain as well. She has not been to the dentist in years. She denies fever, difficulty swallowing, difficulty opening her mouth. Shortness of breath. This morning she noticed that swelling was worse and that as well as the pain therefore she came to the ED for further evaluation.  HPI  History reviewed. No pertinent past medical history.  Patient Active Problem List   Diagnosis Date Noted  . 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 URINALYSIS, ABNORMAL 05/09/2010  . ANXIETY 03/01/2010  . DEPRESSION 03/01/2010  . HEADACHE 03/01/2010  . CHEST PAIN UNSPECIFIED 03/01/2010    History reviewed. No pertinent surgical history.  OB History    No data available       Home Medications    Prior to Admission medications   Medication Sig Start Date End Date Taking? Authorizing Provider  acetaminophen (TYLENOL) 325 MG tablet Take 650 mg by mouth every 6 (six) hours as needed for moderate pain.    Historical Provider, MD  desvenlafaxine (PRISTIQ) 50 MG 24 hr tablet Take 50 mg by mouth daily. 12/05/15 12/04/16  Historical Provider, MD  ibuprofen (ADVIL,MOTRIN) 800 MG tablet Take 1 tablet (800 mg total) by mouth every 8 (eight) hours as needed for mild pain or moderate pain. 12/23/15   Clayton Bibles, PA-C  metroNIDAZOLE (FLAGYL) 500 MG tablet Take 1 tablet (500 mg total) by mouth 2 (two) times daily. 12/23/15   Clayton Bibles, PA-C    Family History History reviewed. No pertinent family history.  Social History Social History  Substance Use Topics  . Smoking status: Never Smoker  . Smokeless tobacco: Never Used   . Alcohol use Yes     Comment: social     Allergies   Patient has no known allergies.   Review of Systems Review of Systems  Constitutional: Negative for fever.  HENT: Positive for dental problem and facial swelling. Negative for drooling and trouble swallowing.   Respiratory: Negative for shortness of breath.      Physical Exam Updated Vital Signs BP 119/76 (BP Location: Right Arm)   Pulse 88   Temp 98.3 F (36.8 C) (Oral)   Resp 18   Ht 5\' 1"  (1.549 m)   Wt 77.1 kg   LMP 12/15/2016   SpO2 100%   BMI 32.12 kg/m   Physical Exam  Constitutional: She is oriented to person, place, and time. She appears well-developed and well-nourished. No distress.  HENT:  Head: Normocephalic and atraumatic.  Mouth/Throat: Uvula is midline, oropharynx is clear and moist and mucous membranes are normal. There is trismus (mild) in the jaw. Abnormal dentition. Dental abscesses (right lower) and dental caries (right lower) present.  Eyes: Conjunctivae are normal. Pupils are equal, round, and reactive to light. Right eye exhibits no discharge. Left eye exhibits no discharge. No scleral icterus.  Neck: Normal range of motion.  Cardiovascular: Normal rate.   Pulmonary/Chest: Effort normal. No respiratory distress.  Abdominal: She exhibits no distension.  Neurological: She is alert and oriented to person, place, and time.  Skin: Skin is warm and dry.  Psychiatric: She has a normal mood and affect. Her behavior is normal.  Nursing note and vitals reviewed.    ED Treatments / Results  Labs (all labs ordered are listed, but only abnormal results are displayed) Labs Reviewed - No data to display  EKG  EKG Interpretation None       Radiology No results found.  Procedures Procedures (including critical care time)  INCISION AND DRAINAGE Performed by: Recardo Evangelist Consent: Verbal consent obtained. Risks and benefits: risks, benefits and alternatives were discussed Type:  abscess  Body area: left lower molar/cheek  Anesthesia: local infiltration  Incision was made with a 18G needle.  Local anesthetic: bupivicaine 0.5% w epi  Anesthetic total: 1 ml  Complexity: simple Blunt dissection to break up loculations  Drainage: purulent  Drainage amount: 2cc  Packing material: none  Patient tolerance: Patient tolerated the procedure well with no immediate complications.     Medications Ordered in ED Medications  bupivacaine-epinephrine (MARCAINE W/ EPI) 0.5% -1:200000 injection 1.8 mL (1.8 mLs Infiltration Given 12/29/16 1047)     Initial Impression / Assessment and Plan / ED Course  I have reviewed the triage vital signs and the nursing notes.  Pertinent labs & imaging results that were available during my care of the patient were reviewed by me and considered in my medical decision making (see chart for details).  46 year old female with dental abscess due to caries. Vitals are normal. She is afebrile, non-tachycardic, and able to tolerate PO. Abscess was drained and a moderate amount of purulent material was expressed. She tolerated well. Fortunately we have a dentist on call and she was given information to follow up. Return precautions given.  Final Clinical Impressions(s) / ED Diagnoses   Final diagnoses:  Dental abscess    New Prescriptions New Prescriptions   No medications on file     Recardo Evangelist, PA-C 12/29/16 [place], MD 01/01/17 0003

## 2016-12-29 NOTE — ED Triage Notes (Signed)
Per Pt, Pt woke up with swelling and pain to the left face and gums this morning.

## 2016-12-29 NOTE — ED Notes (Signed)
Pt states she understands instructions,home stable with steady gait.

## 2016-12-29 NOTE — Discharge Instructions (Signed)
Please follow up with a dentist Take antibiotic for the next 10 days

## 2016-12-29 NOTE — ED Notes (Signed)
Rolled swab at left cheek per vo PA.

## 2017-01-28 DIAGNOSIS — F411 Generalized anxiety disorder: Secondary | ICD-10-CM | POA: Diagnosis not present

## 2017-01-28 DIAGNOSIS — F339 Major depressive disorder, recurrent, unspecified: Secondary | ICD-10-CM | POA: Diagnosis not present

## 2017-03-09 DIAGNOSIS — F411 Generalized anxiety disorder: Secondary | ICD-10-CM | POA: Diagnosis not present

## 2017-03-09 DIAGNOSIS — F339 Major depressive disorder, recurrent, unspecified: Secondary | ICD-10-CM | POA: Diagnosis not present

## 2017-04-16 DIAGNOSIS — F339 Major depressive disorder, recurrent, unspecified: Secondary | ICD-10-CM | POA: Diagnosis not present

## 2017-04-16 DIAGNOSIS — F411 Generalized anxiety disorder: Secondary | ICD-10-CM | POA: Diagnosis not present

## 2017-05-18 ENCOUNTER — Encounter (HOSPITAL_COMMUNITY): Payer: Self-pay | Admitting: *Deleted

## 2017-05-18 DIAGNOSIS — L03112 Cellulitis of left axilla: Secondary | ICD-10-CM | POA: Diagnosis not present

## 2017-05-18 DIAGNOSIS — F419 Anxiety disorder, unspecified: Secondary | ICD-10-CM | POA: Insufficient documentation

## 2017-05-18 DIAGNOSIS — F329 Major depressive disorder, single episode, unspecified: Secondary | ICD-10-CM | POA: Diagnosis not present

## 2017-05-18 DIAGNOSIS — L02411 Cutaneous abscess of right axilla: Secondary | ICD-10-CM | POA: Insufficient documentation

## 2017-05-18 DIAGNOSIS — M79621 Pain in right upper arm: Secondary | ICD-10-CM | POA: Diagnosis present

## 2017-05-18 DIAGNOSIS — Z79899 Other long term (current) drug therapy: Secondary | ICD-10-CM | POA: Insufficient documentation

## 2017-05-18 MED ORDER — ACETAMINOPHEN 325 MG PO TABS
650.0000 mg | ORAL_TABLET | Freq: Once | ORAL | Status: AC | PRN
  Administered 2017-05-18: 650 mg via ORAL

## 2017-05-18 MED ORDER — ACETAMINOPHEN 325 MG PO TABS
ORAL_TABLET | ORAL | Status: AC
  Filled 2017-05-18: qty 2

## 2017-05-18 NOTE — ED Triage Notes (Signed)
Pt reports cutting herself shaving under right arm x 3 weeks ago, now has swelling pain and fever.

## 2017-05-19 ENCOUNTER — Emergency Department (HOSPITAL_COMMUNITY)
Admission: EM | Admit: 2017-05-19 | Discharge: 2017-05-19 | Disposition: A | Discharge status: Home / Self Care | Payer: Medicare HMO | Attending: Emergency Medicine | Admitting: Emergency Medicine

## 2017-05-19 DIAGNOSIS — L03112 Cellulitis of left axilla: Secondary | ICD-10-CM

## 2017-05-19 DIAGNOSIS — L02411 Cutaneous abscess of right axilla: Secondary | ICD-10-CM

## 2017-05-19 MED ORDER — CEPHALEXIN 250 MG PO CAPS
500.0000 mg | ORAL_CAPSULE | Freq: Once | ORAL | Status: AC
  Administered 2017-05-19: 500 mg via ORAL
  Filled 2017-05-19: qty 2

## 2017-05-19 MED ORDER — HYDROCODONE-ACETAMINOPHEN 5-325 MG PO TABS: 1.0000 | ORAL_TABLET | ORAL | 0 refills | Status: DC | PRN

## 2017-05-19 MED ORDER — IBUPROFEN 200 MG PO TABS
600.0000 mg | ORAL_TABLET | Freq: Once | ORAL | Status: AC
  Administered 2017-05-19: 600 mg via ORAL
  Filled 2017-05-19: qty 1

## 2017-05-19 MED ORDER — LIDOCAINE-EPINEPHRINE (PF) 2 %-1:200000 IJ SOLN
10.0000 mL | Freq: Once | INTRAMUSCULAR | Status: AC
  Administered 2017-05-19: 10 mL

## 2017-05-19 MED ORDER — CEPHALEXIN 500 MG PO CAPS: 500.0000 mg | ORAL_CAPSULE | Freq: Four times a day (QID) | ORAL | 0 refills | Status: AC

## 2017-05-19 NOTE — ED Provider Notes (Signed)
[facility] DEPT Provider Note   CSN: [id number] Arrival date & time: 05/18/17  1719     History   Chief Complaint Chief Complaint  Patient presents with  . Abscess    HPI Maria Carter is a 46 y.o. female.  Patient presents with painful swelling under her right arm that started as a shaving wound about 3 weeks ago. She reports fever. She has had similar symptoms in the past.    The history is provided by the patient. No language interpreter was used.  Abscess  Associated symptoms: no fever and no nausea     History reviewed. No pertinent past medical history.  Patient Active Problem List   Diagnosis Date Noted  . 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 URINALYSIS, ABNORMAL 05/09/2010  . ANXIETY 03/01/2010  . DEPRESSION 03/01/2010  . HEADACHE 03/01/2010  . CHEST PAIN UNSPECIFIED 03/01/2010    History reviewed. No pertinent surgical history.  OB History    No data available       Home Medications    Prior to Admission medications   Medication Sig Start Date End Date Taking? Authorizing Provider  acetaminophen (TYLENOL) 325 MG tablet Take 650 mg by mouth every 6 (six) hours as needed for moderate pain.    [provider]  clindamycin (CLEOCIN) 300 MG capsule Take 1 capsule (300 mg total) by mouth 3 (three) times daily. 12/29/16   Recardo Evangelist, PA-C  desvenlafaxine (PRISTIQ) 50 MG 24 hr tablet Take 50 mg by mouth daily. 12/05/15 12/04/16  [provider]  HYDROcodone-acetaminophen (NORCO/VICODIN) 5-325 MG tablet Take 2 tablets by mouth every 4 (four) hours as needed. 12/29/16   Recardo Evangelist, PA-C  ibuprofen (ADVIL,MOTRIN) 600 MG tablet Take 1 tablet (600 mg total) by mouth every 6 (six) hours as needed. 12/29/16   Recardo Evangelist, PA-C  metroNIDAZOLE (FLAGYL) 500 MG tablet Take 1 tablet (500 mg total) by mouth 2 (two) times daily. 12/23/15   Clayton Bibles, PA-C    Family History History reviewed. No pertinent family history.  Social  History Social History  Substance Use Topics  . Smoking status: Never Smoker  . Smokeless tobacco: Never Used  . Alcohol use Yes     Comment: social     Allergies   Patient has no known allergies.   Review of Systems Review of Systems  Constitutional: Negative for chills and fever.  Gastrointestinal: Negative.  Negative for nausea.  Musculoskeletal: Negative.  Negative for myalgias.  Skin:       C/O Abscess.  Neurological: Negative.      Physical Exam Updated Vital Signs BP (!) 121/53 (BP Location: Left Arm)   Pulse 86   Temp 98.9 F (37.2 C) (Oral)   Resp 14   LMP 05/05/2017   SpO2 100%   Physical Exam  Constitutional: She is oriented to person, place, and time. She appears well-developed and well-nourished.  Neck: Normal range of motion.  Pulmonary/Chest: Effort normal.  Neurological: She is alert and oriented to person, place, and time.  Skin: Skin is warm and dry.  Area of swelling and induration and fluctuance right axilla, measuring approximately 4 cm x 3 cm. No active drainage. Minor surrounding cellulitic changes of erythema.      ED Treatments / Results  Labs (all labs ordered are listed, but only abnormal results are displayed) Labs Reviewed - No data to display  EKG  EKG Interpretation None       Radiology No results found.  Procedures .Marland Kitchen  Incision and Drainage Date/Time: 05/24/2017 7:15 AM Performed by: Charlann Lange Authorized by: Charlann Lange   Consent:    Consent obtained:  Verbal   Consent given by:  Patient Location:    Type:  Abscess Pre-procedure details:    Skin preparation:  Betadine Anesthesia (see MAR for exact dosages):    Anesthesia method:  Local infiltration   Local anesthetic:  Lidocaine 1% WITH epi Procedure type:    Complexity:  Simple Procedure details:    Needle aspiration: no     Incision types:  Stab incision   Incision depth:  Subcutaneous   Scalpel blade:  11   Wound management:  Probed and  deloculated and irrigated with saline   Drainage:  Purulent   Drainage amount:  Moderate   Packing materials:  1/4 in gauze Post-procedure details:    Patient tolerance of procedure:  Tolerated well, no immediate complications   (including critical care time)  Medications Ordered in ED Medications  acetaminophen (TYLENOL) 325 MG tablet (not administered)  ibuprofen (ADVIL,MOTRIN) tablet 600 mg (not administered)  acetaminophen (TYLENOL) tablet 650 mg (650 mg Oral Given 05/18/17 1802)     Initial Impression / Assessment and Plan / ED Course  I have reviewed the triage vital signs and the nursing notes.  Pertinent labs & imaging results that were available during my care of the patient were reviewed by me and considered in my medical decision making (see chart for details).     Patient with uncomplicated abscess, I&D'd without complication. Will cover with Keflex and provide pain control. She will return here in 2 days for recheck and packing removal.   Final Clinical Impressions(s) / ED Diagnoses   Final diagnoses:  None   1. Abscess, right axilla  New Prescriptions New Prescriptions   No medications on file     Dennie Bible 05/24/17 1324    Ezequiel Essex, MD 05/24/17 [phone]

## 2017-05-21 ENCOUNTER — Emergency Department (HOSPITAL_COMMUNITY)
Admission: EM | Admit: 2017-05-21 | Discharge: 2017-05-21 | Disposition: A | Discharge status: Home / Self Care | Payer: Medicare HMO | Attending: Emergency Medicine | Admitting: Emergency Medicine

## 2017-05-21 ENCOUNTER — Encounter (HOSPITAL_COMMUNITY): Payer: Self-pay | Admitting: Emergency Medicine

## 2017-05-21 DIAGNOSIS — Z5189 Encounter for other specified aftercare: Secondary | ICD-10-CM

## 2017-05-21 DIAGNOSIS — L0201 Cutaneous abscess of face: Secondary | ICD-10-CM | POA: Insufficient documentation

## 2017-05-21 DIAGNOSIS — F329 Major depressive disorder, single episode, unspecified: Secondary | ICD-10-CM | POA: Insufficient documentation

## 2017-05-21 DIAGNOSIS — F419 Anxiety disorder, unspecified: Secondary | ICD-10-CM | POA: Insufficient documentation

## 2017-05-21 DIAGNOSIS — Z4801 Encounter for change or removal of surgical wound dressing: Secondary | ICD-10-CM | POA: Diagnosis not present

## 2017-05-21 DIAGNOSIS — Z79899 Other long term (current) drug therapy: Secondary | ICD-10-CM | POA: Insufficient documentation

## 2017-05-21 MED ORDER — BACITRACIN ZINC 500 UNIT/GM EX OINT
TOPICAL_OINTMENT | Freq: Once | CUTANEOUS | Status: AC
  Administered 2017-05-21: 19:00:00 via TOPICAL

## 2017-05-21 MED ORDER — IBUPROFEN 600 MG PO TABS: 600.0000 mg | ORAL_TABLET | Freq: Four times a day (QID) | ORAL | 0 refills | Status: DC | PRN

## 2017-05-21 NOTE — ED Notes (Signed)
Called 3x no response 

## 2017-05-21 NOTE — ED Provider Notes (Signed)
[facility] DEPT Provider Note   CSN: [id number] Arrival date & time: 05/21/17  1603     History   Chief Complaint Chief Complaint  Patient presents with  . Recurrent Skin Infections    HPI Maria Carter is a 46 y.o. female.  The history is provided by the patient and medical records. No language interpreter was used.   Maria Carter is a 46 y.o. female who presents to the Emergency Department for wound check of abscess. She was seen in ED on 9/18 for abscess to the right axilla. The area was drained and packing was placed. She states that she was given antibiotic which she has been taking as directed with no missed doses. She feels as if the area is getting better. She still notes some drainage and foul odor but this is improving. No fever or chills.   History reviewed. No pertinent past medical history.  Patient Active Problem List   Diagnosis Date Noted  . 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 URINALYSIS, ABNORMAL 05/09/2010  . ANXIETY 03/01/2010  . DEPRESSION 03/01/2010  . HEADACHE 03/01/2010  . CHEST PAIN UNSPECIFIED 03/01/2010    History reviewed. No pertinent surgical history.  OB History    No data available       Home Medications    Prior to Admission medications   Medication Sig Start Date End Date Taking? Authorizing Provider  acetaminophen (TYLENOL) 325 MG tablet Take 650 mg by mouth every 6 (six) hours as needed for moderate pain.    [provider]  cephALEXin (KEFLEX) 500 MG capsule Take 1 capsule (500 mg total) by mouth 4 (four) times daily. 05/19/17 05/26/17  Charlann Lange, PA-C  clindamycin (CLEOCIN) 300 MG capsule Take 1 capsule (300 mg total) by mouth 3 (three) times daily. 12/29/16   Recardo Evangelist, PA-C  desvenlafaxine (PRISTIQ) 50 MG 24 hr tablet Take 50 mg by mouth daily. 12/05/15 12/04/16  [provider]  HYDROcodone-acetaminophen (NORCO/VICODIN) 5-325 MG tablet Take 1-2 tablets by mouth every 4 (four) hours as needed. 05/19/17    Charlann Lange, PA-C  ibuprofen (ADVIL,MOTRIN) 600 MG tablet Take 1 tablet (600 mg total) by mouth every 6 (six) hours as needed. 12/29/16   Recardo Evangelist, PA-C  metroNIDAZOLE (FLAGYL) 500 MG tablet Take 1 tablet (500 mg total) by mouth 2 (two) times daily. 12/23/15   Clayton Bibles, PA-C    Family History History reviewed. No pertinent family history.  Social History Social History  Substance Use Topics  . Smoking status: Never Smoker  . Smokeless tobacco: Never Used  . Alcohol use Yes     Comment: social     Allergies   Patient has no known allergies.   Review of Systems Review of Systems  Constitutional: Negative for fever.  Skin: Positive for wound.     Physical Exam Updated Vital Signs BP 120/79 (BP Location: Left Arm)   Pulse 69   Temp 98 F (36.7 C) (Oral)   Resp 16   LMP 05/05/2017   SpO2 100%   Physical Exam  Constitutional: She appears well-developed and well-nourished. No distress.  HENT:  Head: Normocephalic and atraumatic.  Neck: Neck supple.  Cardiovascular: Normal rate, regular rhythm and normal heart sounds.   No murmur heard. Pulmonary/Chest: Effort normal and breath sounds normal. No respiratory distress. She has no wheezes. She has no rales.  Musculoskeletal: Normal range of motion.  Neurological: She is alert.  Skin: Skin is warm and dry.  Abscess to the  right axilla with packing in place. No surrounding erythema.   Nursing note and vitals reviewed.    ED Treatments / Results  Labs (all labs ordered are listed, but only abnormal results are displayed) Labs Reviewed - No data to display  EKG  EKG Interpretation None       Radiology No results found.  Procedures Procedures (including critical care time)  Medications Ordered in ED Medications  bacitracin ointment (not administered)     Initial Impression / Assessment and Plan / ED Course  I have reviewed the triage vital signs and the nursing notes.  Pertinent labs &  imaging results that were available during my care of the patient were reviewed by me and considered in my medical decision making (see chart for details).    Maria Carter is a 46 y.o. female who presents to ED for wound check of abscess. She feels like wound is healing well. She is taking ABX as directed. Packing removed. Counseled on wound care and stressed importance of finishing ABX until completion. Understands return precautions. All questions answered.    Final Clinical Impressions(s) / ED Diagnoses   Final diagnoses:  Wound check, abscess    New Prescriptions New Prescriptions   No medications on file     Chania Kochanski, Ozella Almond, PA-C 05/21/17 Dena Billet, MD 05/22/17 [phone]

## 2017-05-21 NOTE — Discharge Instructions (Signed)
It was my pleasure taking care of you today!   Continue antibiotic until completion.   Keep wound clean and dry.   Return to ER for new or worsening symptoms, any additional concerns.

## 2017-05-21 NOTE — ED Triage Notes (Signed)
Pt presents to ED to have packing removed from her abscess drainage done Tuesday here.

## 2017-06-11 ENCOUNTER — Encounter: Payer: Self-pay | Admitting: Obstetrics & Gynecology

## 2017-06-26 ENCOUNTER — Encounter: Payer: Self-pay | Admitting: Obstetrics & Gynecology

## 2017-06-26 ENCOUNTER — Ambulatory Visit (INDEPENDENT_AMBULATORY_CARE_PROVIDER_SITE_OTHER): Payer: Medicare HMO | Admitting: Obstetrics & Gynecology

## 2017-06-26 DIAGNOSIS — N760 Acute vaginitis: Secondary | ICD-10-CM | POA: Diagnosis not present

## 2017-06-26 DIAGNOSIS — B9689 Other specified bacterial agents as the cause of diseases classified elsewhere: Secondary | ICD-10-CM | POA: Diagnosis not present

## 2017-06-26 DIAGNOSIS — B373 Candidiasis of vulva and vagina: Secondary | ICD-10-CM | POA: Diagnosis not present

## 2017-06-26 DIAGNOSIS — Z113 Encounter for screening for infections with a predominantly sexual mode of transmission: Secondary | ICD-10-CM

## 2017-06-26 DIAGNOSIS — B3731 Acute candidiasis of vulva and vagina: Secondary | ICD-10-CM

## 2017-06-26 DIAGNOSIS — N898 Other specified noninflammatory disorders of vagina: Secondary | ICD-10-CM

## 2017-06-26 LAB — WET PREP FOR TRICH, YEAST, CLUE

## 2017-06-26 MED ORDER — TINIDAZOLE 500 MG PO TABS: 2.0000 g | ORAL_TABLET | Freq: Every day | ORAL | 0 refills | Status: AC

## 2017-06-26 MED ORDER — FLUCONAZOLE 150 MG PO TABS: 150.0000 mg | ORAL_TABLET | Freq: Every day | ORAL | 2 refills | Status: AC

## 2017-06-26 MED ORDER — FLUCONAZOLE 150 MG PO TABS: 150.0000 mg | ORAL_TABLET | Freq: Once | ORAL | 0 refills | Status: DC

## 2017-06-26 NOTE — Progress Notes (Signed)
    Maria Carter Dec 12, 1970 [id number]        46 y.o.  [id number] Single.    RP:  C/O vaginal d/c, burning and itching  HPI: Increased vaginal d/c with burning and itching.  No UTI Sx.  Menses regular normal.  LMP 06/04/2017.  Not sexually active x 4 months.  No pelvic pain.  No fever.  Past medical history,surgical history, problem list, medications, allergies, family history and social history were all reviewed and documented in the EPIC chart.  Directed ROS with pertinent positives and negatives documented in the history of present illness/assessment and plan.  Exam:  There were no vitals filed for this visit. General appearance:  Normal  Gyn exam:  Vulva normal.  Speculum:  Cervix/Vagina normal.  Increased vaginal d/c.  Wet prep, gono-chlam done.   Assessment/Plan:  46 y.o. [id number]   1. Vaginal discharge Both Bacterial Vaginosis and Yeast vaginitis.   - WET PREP FOR TRICH, YEAST, CLUE  2. Bacterial vaginosis Will treat with Tinidazole first.  Usage reviewed.  3. Yeast vaginitis Then take Fluconazole 150 mg 1 tab daily x 3 days.  Usage reviewed.  4. Screen for STD (sexually transmitted disease) Condoms recommended - Gono-Chlam - HIV antibody (with reflex) - RPR - Hepatitis B Surface AntiGEN - Hepatitis C Antibody  Counseling on above issues >50% x15 minutes  Princess Bruins MD, 12:51 PM 06/26/2017

## 2017-06-27 LAB — C. TRACHOMATIS/N. GONORRHOEAE RNA
C. trachomatis RNA, TMA: NOT DETECTED
N. gonorrhoeae RNA, TMA: NOT DETECTED

## 2017-06-27 NOTE — Patient Instructions (Signed)
1. Vaginal discharge Both Bacterial Vaginosis and Yeast vaginitis.   - WET PREP FOR TRICH, YEAST, CLUE  2. Bacterial vaginosis Will treat with Tinidazole first.  Usage reviewed.  3. Yeast vaginitis Then take Fluconazole 150 mg 1 tab daily x 3 days.  Usage reviewed.  4. Screen for STD (sexually transmitted disease) Condoms recommended - Gono-Chlam - HIV antibody (with reflex) - RPR - Hepatitis B Surface AntiGEN - Hepatitis C Antibody  Maria Carter, it was a pleasure seeing you today!  I will inform you of your results as soon as available.   Bacterial Vaginosis Bacterial vaginosis is a vaginal infection that occurs when the normal balance of bacteria in the vagina is disrupted. It results from an overgrowth of certain bacteria. This is the most common vaginal infection among women ages 34-44. Because bacterial vaginosis increases your risk for STIs (sexually transmitted infections), getting treated can help reduce your risk for chlamydia, gonorrhea, herpes, and HIV (human immunodeficiency virus). Treatment is also important for preventing complications in pregnant women, because this condition can cause an early (premature) delivery. What are the causes? This condition is caused by an increase in harmful bacteria that are normally present in small amounts in the vagina. However, the reason that the condition develops is not fully understood. What increases the risk? The following factors may make you more likely to develop this condition:  Having a new sexual partner or multiple sexual partners.  Having unprotected sex.  Douching.  Having an intrauterine device (IUD).  Smoking.  Drug and alcohol abuse.  Taking certain antibiotic medicines.  Being pregnant.  You cannot get bacterial vaginosis from toilet seats, bedding, swimming pools, or contact with objects around you. What are the signs or symptoms? Symptoms of this condition include:  Grey or white vaginal discharge. The  discharge can also be watery or foamy.  A fish-like odor with discharge, especially after sexual intercourse or during menstruation.  Itching in and around the vagina.  Burning or pain with urination.  Some women with bacterial vaginosis have no signs or symptoms. How is this diagnosed? This condition is diagnosed based on:  Your medical history.  A physical exam of the vagina.  Testing a sample of vaginal fluid under a microscope to look for a large amount of bad bacteria or abnormal cells. Your health care provider may use a cotton swab or a small wooden spatula to collect the sample.  How is this treated? This condition is treated with antibiotics. These may be given as a pill, a vaginal cream, or a medicine that is put into the vagina (suppository). If the condition comes back after treatment, a second round of antibiotics may be needed. Follow these instructions at home: Medicines  Take over-the-counter and prescription medicines only as told by your health care provider.  Take or use your antibiotic as told by your health care provider. Do not stop taking or using the antibiotic even if you start to feel better. General instructions  If you have a female sexual partner, tell her that you have a vaginal infection. She should see her health care provider and be treated if she has symptoms. If you have a female sexual partner, he does not need treatment.  During treatment: ? Avoid sexual activity until you finish treatment. ? Do not douche. ? Avoid alcohol as directed by your health care provider. ? Avoid breastfeeding as directed by your health care provider.  Drink enough water and fluids to keep your urine clear or  pale yellow.  Keep the area around your vagina and rectum clean. ? Wash the area daily with warm water. ? Wipe yourself from front to back after using the toilet.  Keep all follow-up visits as told by your health care provider. This is important. How is this  prevented?  Do not douche.  Wash the outside of your vagina with warm water only.  Use protection when having sex. This includes latex condoms and dental dams.  Limit how many sexual partners you have. To help prevent bacterial vaginosis, it is best to have sex with just one partner (monogamous).  Make sure you and your sexual partner are tested for STIs.  Wear cotton or cotton-lined underwear.  Avoid wearing tight pants and pantyhose, especially during summer.  Limit the amount of alcohol that you drink.  Do not use any products that contain nicotine or tobacco, such as cigarettes and e-cigarettes. If you need help quitting, ask your health care provider.  Do not use illegal drugs. Where to find more information:  Centers for Disease Control and Prevention: AppraiserFraud.fi  American Sexual Health Association (ASHA): www.ashastd.org  U.S. Department of Health and Financial controller, Office on Women's Health: DustingSprays.pl or SecuritiesCard.it Contact a health care provider if:  Your symptoms do not improve, even after treatment.  You have more discharge or pain when urinating.  You have a fever.  You have pain in your abdomen.  You have pain during sex.  You have vaginal bleeding between periods. Summary  Bacterial vaginosis is a vaginal infection that occurs when the normal balance of bacteria in the vagina is disrupted.  Because bacterial vaginosis increases your risk for STIs (sexually transmitted infections), getting treated can help reduce your risk for chlamydia, gonorrhea, herpes, and HIV (human immunodeficiency virus). Treatment is also important for preventing complications in pregnant women, because the condition can cause an early (premature) delivery.  This condition is treated with antibiotic medicines. These may be given as a pill, a vaginal cream, or a medicine that is put into the vagina (suppository). This  information is not intended to replace advice given to you by your health care provider. Make sure you discuss any questions you have with your health care provider. Document Released: 08/18/2005 Document Revised: 05/03/2016 Document Reviewed: 05/03/2016 Elsevier Interactive Patient Education  2017 [employer].   Vaginal Yeast infection, Adult Vaginal yeast infection is a condition that causes soreness, swelling, and redness (inflammation) of the vagina. It also causes vaginal discharge. This is a common condition. Some women get this infection frequently. What are the causes? This condition is caused by a change in the normal balance of the yeast (candida) and bacteria that live in the vagina. This change causes an overgrowth of yeast, which causes the inflammation. What increases the risk? This condition is more likely to develop in:  Women who take antibiotic medicines.  Women who have diabetes.  Women who take birth control pills.  Women who are pregnant.  Women who douche often.  Women who have a weak defense (immune) system.  Women who have been taking steroid medicines for a long time.  Women who frequently wear tight clothing.  What are the signs or symptoms? Symptoms of this condition include:  White, thick vaginal discharge.  Swelling, itching, redness, and irritation of the vagina. The lips of the vagina (vulva) may be affected as well.  Pain or a burning feeling while urinating.  Pain during sex.  How is this diagnosed? This condition is diagnosed  with a medical history and physical exam. This will include a pelvic exam. Your health care provider will examine a sample of your vaginal discharge under a microscope. Your health care provider may send this sample for testing to confirm the diagnosis. How is this treated? This condition is treated with medicine. Medicines may be over-the-counter or prescription. You may be told to use one or more of the  following:  Medicine that is taken orally.  Medicine that is applied as a cream.  Medicine that is inserted directly into the vagina (suppository).  Follow these instructions at home:  Take or apply over-the-counter and prescription medicines only as told by your health care provider.  Do not have sex until your health care provider has approved. Tell your sex partner that you have a yeast infection. That person should go to his or her health care provider if he or she develops symptoms.  Do not wear tight clothes, such as pantyhose or tight pants.  Avoid using tampons until your health care provider approves.  Eat more yogurt. This may help to keep your yeast infection from returning.  Try taking a sitz bath to help with discomfort. This is a warm water bath that is taken while you are sitting down. The water should only come up to your hips and should cover your buttocks. Do this 3-4 times per day or as told by your health care provider.  Do not douche.  Wear breathable, cotton underwear.  If you have diabetes, keep your blood sugar levels under control. Contact a health care provider if:  You have a fever.  Your symptoms go away and then return.  Your symptoms do not get better with treatment.  Your symptoms get worse.  You have new symptoms.  You develop blisters in or around your vagina.  You have blood coming from your vagina and it is not your menstrual period.  You develop pain in your abdomen. This information is not intended to replace advice given to you by your health care provider. Make sure you discuss any questions you have with your health care provider. Document Released: 05/28/2005 Document Revised: 01/30/2016 Document Reviewed: 02/19/2015 Elsevier Interactive Patient Education  2018 [employer].

## 2017-06-29 LAB — HIV ANTIBODY (ROUTINE TESTING W REFLEX): HIV: NONREACTIVE

## 2017-06-29 LAB — RPR: RPR Ser Ql: NONREACTIVE

## 2017-06-29 LAB — HEPATITIS B SURFACE ANTIGEN: Hepatitis B Surface Ag: NONREACTIVE

## 2017-06-29 LAB — HEPATITIS C ANTIBODY
Hepatitis C Ab: NONREACTIVE
SIGNAL TO CUT-OFF: 0.01 (ref <1.00)

## 2017-07-15 ENCOUNTER — Encounter: Payer: Self-pay | Admitting: Obstetrics & Gynecology

## 2017-08-28 ENCOUNTER — Encounter: Payer: Self-pay | Admitting: Obstetrics & Gynecology

## 2017-10-07 ENCOUNTER — Encounter: Payer: Self-pay | Admitting: Obstetrics & Gynecology

## 2018-01-22 DIAGNOSIS — F330 Major depressive disorder, recurrent, mild: Secondary | ICD-10-CM | POA: Diagnosis not present

## 2018-01-22 DIAGNOSIS — F411 Generalized anxiety disorder: Secondary | ICD-10-CM | POA: Diagnosis not present

## 2019-03-29 DIAGNOSIS — F330 Major depressive disorder, recurrent, mild: Secondary | ICD-10-CM | POA: Diagnosis not present

## 2019-03-29 DIAGNOSIS — F411 Generalized anxiety disorder: Secondary | ICD-10-CM | POA: Diagnosis not present

## 2019-05-11 DIAGNOSIS — F330 Major depressive disorder, recurrent, mild: Secondary | ICD-10-CM | POA: Diagnosis not present

## 2019-05-11 DIAGNOSIS — F411 Generalized anxiety disorder: Secondary | ICD-10-CM | POA: Diagnosis not present

## 2019-06-21 DIAGNOSIS — F330 Major depressive disorder, recurrent, mild: Secondary | ICD-10-CM | POA: Diagnosis not present

## 2019-06-21 DIAGNOSIS — F411 Generalized anxiety disorder: Secondary | ICD-10-CM | POA: Diagnosis not present

## 2019-08-02 DIAGNOSIS — F411 Generalized anxiety disorder: Secondary | ICD-10-CM | POA: Diagnosis not present

## 2019-08-02 DIAGNOSIS — F330 Major depressive disorder, recurrent, mild: Secondary | ICD-10-CM | POA: Diagnosis not present

## 2019-09-13 DIAGNOSIS — F411 Generalized anxiety disorder: Secondary | ICD-10-CM | POA: Diagnosis not present

## 2019-09-13 DIAGNOSIS — F330 Major depressive disorder, recurrent, mild: Secondary | ICD-10-CM | POA: Diagnosis not present

## 2020-01-18 DIAGNOSIS — F419 Anxiety disorder, unspecified: Secondary | ICD-10-CM | POA: Diagnosis not present

## 2020-01-18 DIAGNOSIS — F339 Major depressive disorder, recurrent, unspecified: Secondary | ICD-10-CM | POA: Diagnosis not present

## 2020-01-18 DIAGNOSIS — Z79899 Other long term (current) drug therapy: Secondary | ICD-10-CM | POA: Diagnosis not present

## 2020-01-18 DIAGNOSIS — F900 Attention-deficit hyperactivity disorder, predominantly inattentive type: Secondary | ICD-10-CM | POA: Diagnosis not present

## 2020-02-29 DIAGNOSIS — Z79899 Other long term (current) drug therapy: Secondary | ICD-10-CM | POA: Diagnosis not present

## 2020-02-29 DIAGNOSIS — R41840 Attention and concentration deficit: Secondary | ICD-10-CM | POA: Diagnosis not present

## 2020-02-29 DIAGNOSIS — F330 Major depressive disorder, recurrent, mild: Secondary | ICD-10-CM | POA: Diagnosis not present

## 2020-02-29 DIAGNOSIS — F419 Anxiety disorder, unspecified: Secondary | ICD-10-CM | POA: Diagnosis not present

## 2020-04-11 DIAGNOSIS — F339 Major depressive disorder, recurrent, unspecified: Secondary | ICD-10-CM | POA: Diagnosis not present

## 2020-04-11 DIAGNOSIS — Z79899 Other long term (current) drug therapy: Secondary | ICD-10-CM | POA: Diagnosis not present

## 2020-04-11 DIAGNOSIS — F419 Anxiety disorder, unspecified: Secondary | ICD-10-CM | POA: Diagnosis not present

## 2020-05-23 DIAGNOSIS — F339 Major depressive disorder, recurrent, unspecified: Secondary | ICD-10-CM | POA: Diagnosis not present

## 2020-05-23 DIAGNOSIS — Z79899 Other long term (current) drug therapy: Secondary | ICD-10-CM | POA: Diagnosis not present

## 2020-05-23 DIAGNOSIS — F419 Anxiety disorder, unspecified: Secondary | ICD-10-CM | POA: Diagnosis not present

## 2020-08-21 DIAGNOSIS — F339 Major depressive disorder, recurrent, unspecified: Secondary | ICD-10-CM | POA: Diagnosis not present

## 2020-08-21 DIAGNOSIS — Z79899 Other long term (current) drug therapy: Secondary | ICD-10-CM | POA: Diagnosis not present

## 2020-08-21 DIAGNOSIS — F419 Anxiety disorder, unspecified: Secondary | ICD-10-CM | POA: Diagnosis not present

## 2021-02-08 DIAGNOSIS — F4310 Post-traumatic stress disorder, unspecified: Secondary | ICD-10-CM | POA: Insufficient documentation

## 2021-02-08 DIAGNOSIS — Z79899 Other long term (current) drug therapy: Secondary | ICD-10-CM | POA: Diagnosis not present

## 2021-02-08 DIAGNOSIS — F419 Anxiety disorder, unspecified: Secondary | ICD-10-CM | POA: Diagnosis not present

## 2021-02-08 DIAGNOSIS — F339 Major depressive disorder, recurrent, unspecified: Secondary | ICD-10-CM | POA: Diagnosis not present

## 2021-05-21 DIAGNOSIS — Z79899 Other long term (current) drug therapy: Secondary | ICD-10-CM | POA: Diagnosis not present

## 2021-05-21 DIAGNOSIS — F419 Anxiety disorder, unspecified: Secondary | ICD-10-CM | POA: Diagnosis not present

## 2021-05-21 DIAGNOSIS — F339 Major depressive disorder, recurrent, unspecified: Secondary | ICD-10-CM | POA: Diagnosis not present

## 2021-08-14 DIAGNOSIS — F419 Anxiety disorder, unspecified: Secondary | ICD-10-CM | POA: Diagnosis not present

## 2021-08-14 DIAGNOSIS — Z79899 Other long term (current) drug therapy: Secondary | ICD-10-CM | POA: Diagnosis not present

## 2021-08-14 DIAGNOSIS — F339 Major depressive disorder, recurrent, unspecified: Secondary | ICD-10-CM | POA: Diagnosis not present

## 2021-11-26 DIAGNOSIS — F419 Anxiety disorder, unspecified: Secondary | ICD-10-CM | POA: Diagnosis not present

## 2021-11-26 DIAGNOSIS — F339 Major depressive disorder, recurrent, unspecified: Secondary | ICD-10-CM | POA: Diagnosis not present

## 2021-11-26 DIAGNOSIS — Z79899 Other long term (current) drug therapy: Secondary | ICD-10-CM | POA: Diagnosis not present

## 2021-12-16 ENCOUNTER — Other Ambulatory Visit: Payer: Self-pay

## 2021-12-16 ENCOUNTER — Encounter (HOSPITAL_COMMUNITY): Payer: Self-pay | Admitting: Emergency Medicine

## 2021-12-16 ENCOUNTER — Emergency Department (HOSPITAL_COMMUNITY)
Admission: EM | Admit: 2021-12-16 | Discharge: 2021-12-16 | Disposition: A | Discharge status: Home / Self Care | Payer: Medicare HMO | Attending: Emergency Medicine | Admitting: Emergency Medicine

## 2021-12-16 ENCOUNTER — Emergency Department (HOSPITAL_COMMUNITY): Payer: Medicare HMO

## 2021-12-16 DIAGNOSIS — R519 Headache, unspecified: Secondary | ICD-10-CM | POA: Insufficient documentation

## 2021-12-16 DIAGNOSIS — F17200 Nicotine dependence, unspecified, uncomplicated: Secondary | ICD-10-CM | POA: Insufficient documentation

## 2021-12-16 DIAGNOSIS — N9489 Other specified conditions associated with female genital organs and menstrual cycle: Secondary | ICD-10-CM | POA: Insufficient documentation

## 2021-12-16 DIAGNOSIS — R079 Chest pain, unspecified: Secondary | ICD-10-CM | POA: Diagnosis not present

## 2021-12-16 DIAGNOSIS — R0789 Other chest pain: Secondary | ICD-10-CM | POA: Diagnosis not present

## 2021-12-16 LAB — TROPONIN I (HIGH SENSITIVITY)
Troponin I (High Sensitivity): <2 ng/L (ref <18)
Troponin I (High Sensitivity): 2 ng/L (ref <18)

## 2021-12-16 LAB — CBC
HCT: 39.9 % (ref 36.0–46.0)
Hemoglobin: 12.3 g/dL (ref 12.0–15.0)
MCH: 28.6 pg (ref 26.0–34.0)
MCHC: 30.8 g/dL (ref 30.0–36.0)
MCV: 92.8 fL (ref 80.0–100.0)
Platelets: 158 10*3/uL (ref 150–400)
RBC: 4.3 MIL/uL (ref 3.87–5.11)
RDW: 12.4 % (ref 11.5–15.5)
WBC: 3.3 10*3/uL — ABNORMAL LOW (ref 4.0–10.5)
nRBC: 0 % (ref 0.0–0.2)

## 2021-12-16 LAB — BASIC METABOLIC PANEL
Anion gap: 8 (ref 5–15)
BUN: 13 mg/dL (ref 6–20)
CO2: 26 mmol/L (ref 22–32)
Calcium: 9.6 mg/dL (ref 8.9–10.3)
Chloride: 104 mmol/L (ref 98–111)
Creatinine, Ser: 0.65 mg/dL (ref 0.44–1.00)
GFR, Estimated: >60 mL/min (ref >60)
Glucose, Bld: 105 mg/dL — ABNORMAL HIGH (ref 70–99)
Potassium: 4.4 mmol/L (ref 3.5–5.1)
Sodium: 138 mmol/L (ref 135–145)

## 2021-12-16 LAB — I-STAT BETA HCG BLOOD, ED (MC, WL, AP ONLY): I-stat hCG, quantitative: 6 m[IU]/mL — ABNORMAL HIGH (ref <5)

## 2021-12-16 MED ORDER — ACETAMINOPHEN 325 MG PO TABS
650.0000 mg | ORAL_TABLET | Freq: Four times a day (QID) | ORAL | Status: DC | PRN
  Administered 2021-12-16: 650 mg via ORAL
  Filled 2021-12-16: qty 2

## 2021-12-16 NOTE — ED Triage Notes (Signed)
Pt reports pain under L breast and headache x 3-4 days.  Denies SOB, nausea, and vomiting. ?

## 2021-12-16 NOTE — ED Provider Triage Note (Signed)
Emergency Medicine Provider Triage Evaluation Note ? ?Maria Carter , a 51 y.o. female  was evaluated in triage.  Pt complains of gradual onset, intermittent, L sided chest pain underneath L breast with radiation into L arm x 3-4 days.  Patient states that she has been sweaty at nighttime however believes she is going through menopause and states it is hard to differentiate.  She denies any shortness of breath, nausea, vomiting.  She denies any history of CAD or family history.  She smokes Black and milds.  She also complains of a headache.  Reports history of migraine headaches and states this feels similar.  ? ?Review of Systems  ?Positive: + headache, chest pain, sweating at nighttime ?Negative: - nausea, vomiting, SOB, vision changes, unilateral weakness or numbness ? ?Physical Exam  ?BP 132/71 (BP Location: Left Arm)   Pulse 72   Temp 98 ?F (36.7 ?C) (Oral)   Resp 17   SpO2 100%  ?Gen:   Awake, no distress   ?Resp:  Normal effort  ?MSK:   Moves extremities without difficulty  ?Other:   ? ?Medical Decision Making  ?Medically screening exam initiated at 12:04 PM.  Appropriate orders placed.  FRANKI ALCAIDE was informed that the remainder of the evaluation will be completed by another provider, this initial triage assessment does not replace that evaluation, and the importance of remaining in the ED until their evaluation is complete. ? ? ?  ?Eustaquio Maize, PA-C ?12/16/21 1206 ? ?

## 2021-12-16 NOTE — Discharge Instructions (Addendum)
Please return to the ED with any new symptoms such as increased chest pain, shortness of breath, leg swelling ?Please follow-up with the PCP I referred you to.  Please call make an appointment. ?Please read the attached informational guide concerning causes of chest muscle wall discomfort ?

## 2021-12-16 NOTE — ED Provider Notes (Signed)
?[facility] ?Provider Note ? ? ?CSN: [id number] ?Arrival date & time: 12/16/21  1124 ? ?  ? ?History ? ?Chief Complaint  ?Patient presents with  ? Chest Pain  ? ? ?Maria Carter is a 51 y.o. female with history of anxiety and depression.  The patient presents to the ED for evaluation of chest pain.  Patient states that for the last 3 to 4 days she has had left-sided chest pain underneath her breast.  Patient states that the pain has been coming and going but today the pain was constant which prompted her to come in for evaluation.  Patient describes the pain as "stinging".  Patient denies any OTC medications to attempt to alleviate pain.  Patient denies any history of chest pain, CAD.  Patient does endorse smoking.  Patient states that chest pain radiates into her left arm and is worse with exertion.  Patient denies any shortness of breath, nausea, vomiting, abdominal pain, diarrhea, lightheadedness, dizziness, weakness, fevers, lower extremity swelling. ? ? ?Chest Pain ?Associated symptoms: no abdominal pain, no dizziness, no fever, no nausea, no shortness of breath, no vomiting and no weakness   ? ?  ? ?Home Medications ?Prior to Admission medications   ?Medication Sig Start Date End Date Taking? Authorizing Provider  ?cholecalciferol (VITAMIN D3) 25 MCG (1000 UNIT) tablet Take 1,000 Units by mouth daily.   Yes [provider]  ?desvenlafaxine (PRISTIQ) 100 MG 24 hr tablet Take 100 mg by mouth daily. 11/26/21  Yes [provider]  ?Multiple Vitamins-Minerals (MULTIVITAMIN ADULTS PO) Take 1 tablet by mouth daily.   Yes [provider]  ?vitamin B-12 (CYANOCOBALAMIN) 100 MCG tablet Take 100 mcg by mouth daily.   Yes [provider]  ?   ? ?Allergies    ?Patient has no known allergies.   ? ?Review of Systems   ?Review of Systems  ?Constitutional:  Negative for fever.  ?Respiratory:  Negative for shortness of breath.   ?Cardiovascular:   Positive for chest pain. Negative for leg swelling.  ?Gastrointestinal:  Negative for abdominal pain, diarrhea, nausea and vomiting.  ?Neurological:  Negative for dizziness, weakness and light-headedness.  ?All other systems reviewed and are negative. ? ?Physical Exam ?Updated Vital Signs ?BP 138/87   Pulse 65   Temp 98.3 ?F (36.8 ?C) (Oral)   Resp 16   SpO2 100%  ?Physical Exam ?Vitals and nursing note reviewed.  ?Constitutional:   ?   General: She is not in acute distress. ?   Appearance: She is well-developed. She is not ill-appearing, toxic-appearing or diaphoretic.  ?HENT:  ?   Head: Normocephalic and atraumatic.  ?   Nose: Nose normal.  ?   Mouth/Throat:  ?   Mouth: Mucous membranes are moist.  ?   Pharynx: Oropharynx is clear.  ?Eyes:  ?   Extraocular Movements: Extraocular movements intact.  ?   Conjunctiva/sclera: Conjunctivae normal.  ?   Pupils: Pupils are equal, round, and reactive to light.  ?Neck:  ?   Vascular: No JVD.  ?Cardiovascular:  ?   Rate and Rhythm: Normal rate and regular rhythm.  ?Pulmonary:  ?   Effort: Pulmonary effort is normal. No tachypnea or respiratory distress.  ?   Breath sounds: Normal breath sounds. No wheezing, rhonchi or rales.  ?Abdominal:  ?   General: Abdomen is flat. Bowel sounds are normal.  ?   Palpations: Abdomen is soft.  ?   Tenderness: There is no abdominal tenderness.  ?  Musculoskeletal:  ?   Cervical back: Normal range of motion and neck supple.  ?   Right lower leg: No tenderness. No edema.  ?   Left lower leg: No tenderness. No edema.  ?Skin: ?   General: Skin is warm and dry.  ?   Capillary Refill: Capillary refill takes less than 2 seconds.  ?Neurological:  ?   Mental Status: She is alert and oriented to person, place, and time.  ? ? ?ED Results / Procedures / Treatments   ?Labs ?(all labs ordered are listed, but only abnormal results are displayed) ?Labs Reviewed  ?BASIC METABOLIC PANEL - Abnormal; Notable for the following components:  ?    Result Value  ?  Glucose, Bld 105 (*)   ? All other components within normal limits  ?CBC - Abnormal; Notable for the following components:  ? WBC 3.3 (*)   ? All other components within normal limits  ?I-STAT BETA HCG BLOOD, ED ([facility], [facility], [facility] ONLY) - Abnormal; Notable for the following components:  ? I-stat hCG, quantitative 6.0 (*)   ? All other components within normal limits  ?TROPONIN I (HIGH SENSITIVITY)  ?TROPONIN I (HIGH SENSITIVITY)  ? ? ?EKG ?None ? ?Radiology ?DG Chest 2 View ? ?Result Date: 12/16/2021 ?CLINICAL DATA:  Chest pain. EXAM: CHEST - 2 VIEW COMPARISON:  July 11, 2008. FINDINGS: The heart size and mediastinal contours are within normal limits. Both lungs are clear. The visualized skeletal structures are unremarkable. IMPRESSION: No active cardiopulmonary disease. Electronically Signed   By: Marijo Conception M.D.   On: 12/16/2021 13:04   ? ?Procedures ?Procedures  ? ? ?Medications Ordered in ED ?Medications  ?acetaminophen (TYLENOL) tablet 650 mg (650 mg Oral Given 12/16/21 2319)  ? ? ?ED Course/ Medical Decision Making/ A&P ?  ?                        ?Medical Decision Making ?Amount and/or Complexity of Data Reviewed ?Labs: ordered. ?Radiology: ordered. ? ? ?51 year old female presents ED for evaluation of chest pain.  Please see HPI for further details. ? ?On examination, the patient is afebrile, nontachycardic, nonhypoxic.  The patient has no lower extremity swelling.  The patient's lung sounds are clear bilaterally, she is not hypoxic.  Patient's abdomen is soft compressible all 4 quadrants.  The patient is alert and orient x4.  The patient follows commands. ? ?Patient worked up utilizing the following labs imaging studies interpreted by me personally: ?- Initial troponin less than 2, delta troponin result is 2 ?- BMP unremarkable ?- CBC unremarkable ?- DG chest x-ray shows no signs of cardiomegaly, mediastinal widening, consolidation, effusion ?- EKG shows sinus rhythm ? ?Patient headache treated with 650 mg  of Tylenol ? ?Patient is PERC negative, doubt PE at this time. ? ?Patient denies any active chest pain at present.  Patient heart score is 3. ? ?At this time, I feel that this patient is stable for discharge home.  She has had an unremarkable and negative work-up here in the department.  She denies any active chest pain.  The patient will be referred to a PCP for further management.  The patient has been provided with return precautions and she has voiced understanding with these.  The patient had all of her questions answered to her satisfaction.  Patient is stable at this time for discharge home. ? ? ?Final Clinical Impression(s) / ED Diagnoses ?Final diagnoses:  ?Chest pain, unspecified type  ? ? ?  Rx / DC Orders ?ED Discharge Orders   ? ? None  ? ?  ? ? ?  ?Azucena Cecil, PA-C ?12/16/21 2320 ? ?  ?Regan Lemming, MD ?12/17/21 [phone] ? ?

## 2022-02-26 DIAGNOSIS — F419 Anxiety disorder, unspecified: Secondary | ICD-10-CM | POA: Diagnosis not present

## 2022-02-26 DIAGNOSIS — F339 Major depressive disorder, recurrent, unspecified: Secondary | ICD-10-CM | POA: Diagnosis not present

## 2022-02-26 DIAGNOSIS — Z79899 Other long term (current) drug therapy: Secondary | ICD-10-CM | POA: Diagnosis not present

## 2022-02-28 ENCOUNTER — Encounter: Payer: Self-pay | Admitting: Family Medicine

## 2022-02-28 ENCOUNTER — Ambulatory Visit (INDEPENDENT_AMBULATORY_CARE_PROVIDER_SITE_OTHER): Payer: Medicare HMO | Admitting: Family Medicine

## 2022-02-28 VITALS — BP 116/74 | HR 66 | Temp 97.7°F | Ht 61.0 in

## 2022-02-28 DIAGNOSIS — R079 Chest pain, unspecified: Secondary | ICD-10-CM | POA: Diagnosis not present

## 2022-02-28 DIAGNOSIS — Z1211 Encounter for screening for malignant neoplasm of colon: Secondary | ICD-10-CM | POA: Diagnosis not present

## 2022-02-28 DIAGNOSIS — D229 Melanocytic nevi, unspecified: Secondary | ICD-10-CM

## 2022-02-28 DIAGNOSIS — N912 Amenorrhea, unspecified: Secondary | ICD-10-CM

## 2022-02-28 DIAGNOSIS — R232 Flushing: Secondary | ICD-10-CM

## 2022-02-28 DIAGNOSIS — L659 Nonscarring hair loss, unspecified: Secondary | ICD-10-CM | POA: Diagnosis not present

## 2022-02-28 LAB — CBC WITH DIFFERENTIAL/PLATELET
Basophils Absolute: 0 10*3/uL (ref 0.0–0.1)
Basophils Relative: 0.5 % (ref 0.0–3.0)
Eosinophils Absolute: 0.2 10*3/uL (ref 0.0–0.7)
Eosinophils Relative: 5.4 % — ABNORMAL HIGH (ref 0.0–5.0)
HCT: 38.2 % (ref 36.0–46.0)
Hemoglobin: 12.2 g/dL (ref 12.0–15.0)
Lymphocytes Relative: 36.2 % (ref 12.0–46.0)
Lymphs Abs: 1 10*3/uL (ref 0.7–4.0)
MCHC: 32.1 g/dL (ref 30.0–36.0)
MCV: 88.4 fl (ref 78.0–100.0)
Monocytes Absolute: 0.3 10*3/uL (ref 0.1–1.0)
Monocytes Relative: 10.9 % (ref 3.0–12.0)
Neutro Abs: 1.4 10*3/uL (ref 1.4–7.7)
Neutrophils Relative %: 47 % (ref 43.0–77.0)
Platelets: 145 10*3/uL — ABNORMAL LOW (ref 150.0–400.0)
RBC: 4.32 Mil/uL (ref 3.87–5.11)
RDW: 12.9 % (ref 11.5–15.5)
WBC: 2.9 10*3/uL — ABNORMAL LOW (ref 4.0–10.5)

## 2022-02-28 LAB — TSH: TSH: 0.93 u[IU]/mL (ref 0.35–5.50)

## 2022-02-28 LAB — T4, FREE: Free T4: 0.67 ng/dL (ref 0.60–1.60)

## 2022-02-28 LAB — COMPREHENSIVE METABOLIC PANEL
ALT: 12 U/L (ref 0–35)
AST: 17 U/L (ref 0–37)
Albumin: 4.1 g/dL (ref 3.5–5.2)
Alkaline Phosphatase: 58 U/L (ref 39–117)
BUN: 15 mg/dL (ref 6–23)
CO2: 31 mEq/L (ref 19–32)
Calcium: 9.4 mg/dL (ref 8.4–10.5)
Chloride: 103 mEq/L (ref 96–112)
Creatinine, Ser: 0.76 mg/dL (ref 0.40–1.20)
GFR: 91.57 mL/min (ref >60.00)
Glucose, Bld: 90 mg/dL (ref 70–99)
Potassium: 4.5 mEq/L (ref 3.5–5.1)
Sodium: 138 mEq/L (ref 135–145)
Total Bilirubin: 0.4 mg/dL (ref 0.2–1.2)
Total Protein: 7.2 g/dL (ref 6.0–8.3)

## 2022-02-28 LAB — HCG, QUANTITATIVE, PREGNANCY: Quantitative HCG: 1.28 m[IU]/mL

## 2022-02-28 LAB — FOLLICLE STIMULATING HORMONE: FSH: 51.6 m[IU]/mL

## 2022-02-28 LAB — T3, FREE: T3, Free: 2.9 pg/mL (ref 2.3–4.2)

## 2022-02-28 NOTE — Assessment & Plan Note (Signed)
Recent negative cardiac work up in the ED. Chest pain with anxiety.

## 2022-02-28 NOTE — Progress Notes (Signed)
New Patient Office Visit  Subjective    Patient ID: Maria Carter, female    DOB: Nov 26, 1970  Age: 51 y.o. MRN: [id number]  CC:  Chief Complaint  Patient presents with   Establish Care    Dealing with menopause, hasnt had period in 7 months and goes through a lot of hot flashes as well as more anxiety attacks and hair falling out.   Would also like mole on back of her neck looked at.     HPI Maria Carter presents to establish care.  No PCP recently.  Psychiatrist- Chaya Jan.   States he has not had a menstrual cycle. In 7 months. Having a lot of hot flashes.  Also complains of hair loss for the past several months. Low energy, not sleeping well.   States she has intermittent chest pain. Recent work up in the ED for this. No cardiac findings.   States she has anxiety attacks with chest pain    Not working. 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  Moved here from [place] in 2007.  Single. 4 kids, her oldest is 84 yo, 7 grandchildren.   Does not not smoke.   Colonoscopy - never   Denies fever, chills, dizziness, palpitations, shortness of breath, abdominal pain, N/V/D, urinary symptoms, LE edema.      Outpatient Encounter Medications as of 02/28/2022  Medication Sig   cholecalciferol (VITAMIN D3) 25 MCG (1000 UNIT) tablet Take 1,000 Units by mouth daily.   desvenlafaxine (PRISTIQ) 100 MG 24 hr tablet Take 100 mg by mouth daily.   Multiple Vitamins-Minerals (MULTIVITAMIN ADULTS PO) Take 1 tablet by mouth daily.   vitamin B-12 (CYANOCOBALAMIN) 100 MCG tablet Take 100 mcg by mouth daily.   No facility-administered encounter medications on file as of 02/28/2022.    Past Medical History:  Diagnosis Date   Anxiety    Depression     History reviewed. No pertinent surgical history.  Family History  Problem Relation Age of Onset   Breast cancer Sister 3    Social History   Socioeconomic History   Marital status: Single    Spouse name:  Not on file   Number of children: Not on file   Years of education: Not on file   Highest education level: Not on file  Occupational History   Not on file  Tobacco Use   Smoking status: Never   Smokeless tobacco: Never  Vaping Use   Vaping Use: Never used  Substance and Sexual Activity   Alcohol use: Yes    Comment: social   Drug use: No   Sexual activity: Not Currently    Partners: Male  Other Topics Concern   Not on file  Social History Narrative   Not on file   Social Determinants of Health   Financial Resource Strain: Not on file  Food Insecurity: Not on file  Transportation Needs: Not on file  Physical Activity: Not on file  Stress: Not on file  Social Connections: Not on file  Intimate Partner Violence: Not on file    ROS      Objective    BP 116/74 (BP Location: Left Arm, Patient Position: Sitting, Cuff Size: Large)   Pulse 66   Temp 97.7 F (36.5 C) (Temporal)   Ht '5\' 1"'$  (1.549 m)   SpO2 98%   BMI 32.12 kg/m   Physical Exam Constitutional:      General: She is not in acute distress.  Appearance: She is not ill-appearing.  Cardiovascular:     Rate and Rhythm: Normal rate and regular rhythm.  Pulmonary:     Effort: Pulmonary effort is normal.     Breath sounds: Normal breath sounds.  Skin:    General: Skin is warm and dry.  Neurological:     General: No focal deficit present.     Mental Status: She is alert and oriented to person, place, and time.     Cranial Nerves: No cranial nerve deficit.     Sensory: No sensory deficit.  Psychiatric:        Mood and Affect: Mood normal.        Behavior: Behavior normal.        Thought Content: Thought content normal.         Assessment & Plan:   Problem List Items Addressed This Visit       Cardiovascular and Mediastinum   Hot flashes - Primary    Most likely menopausal. Check labs. Refer to OB/GYN      Relevant Orders   CBC with Differential/Platelet (Completed)   Comprehensive  metabolic panel (Completed)   TSH (Completed)   T4, free (Completed)   T3, free (Completed)   Iron, TIBC and Ferritin Panel     Other   Amenorrhea    Most likely peri-menopausal. Check labs and follow up. She will call and schedule with a gynecologist.       Relevant Orders   TSH (Completed)   T4, free (Completed)   T3, free (Completed)   Follicle stimulating hormone (Completed)   hCG, quantitative, pregnancy (Completed)   Hair thinning    Check labs and follow up      Relevant Orders   CBC with Differential/Platelet (Completed)   Comprehensive metabolic panel (Completed)   TSH (Completed)   T4, free (Completed)   T3, free (Completed)   Iron, TIBC and Ferritin Panel   Intermittent chest pain    Recent negative cardiac work up in the ED. Chest pain with anxiety.       Multiple nevi    Plans to call and schedule with dermatologist       Screening for colon cancer   Relevant Orders   Ambulatory referral to Gastroenterology    Return for pending labs.   Harland Dingwall, NP-C

## 2022-02-28 NOTE — Patient Instructions (Signed)
Obgyn Offices:   [facility] (Laurin Coder, NP) [address] [place], [place] [address] Phone: [phone]   [facility] [address] [place], [place] [phone]  [facility] Address: [address] [place], [place] [address] Phone: [phone]  [facility] [address] [place], [place] [address] Phone: [phone]  Dermatology offices  [facility]: Phone #: [phone] Address: [address] [place], [place] [address]  [facility]: Phone: [phone]  Address: [address] [place], [place] [address]  [facility]: [phone] Address: [address] [place], [place] [address]  [facility] Address: [place], [place], [place] [address] Phone: [phone]

## 2022-02-28 NOTE — Assessment & Plan Note (Signed)
Most likely menopausal. Check labs. Refer to OB/GYN

## 2022-02-28 NOTE — Assessment & Plan Note (Signed)
Plans to call and schedule with dermatologist

## 2022-02-28 NOTE — Assessment & Plan Note (Addendum)
Most likely peri-menopausal. Check labs and follow up. She will call and schedule with a gynecologist.

## 2022-02-28 NOTE — Assessment & Plan Note (Signed)
Check labs and follow up

## 2022-03-01 LAB — IRON,TIBC AND FERRITIN PANEL
%SAT: 27 % (calc) (ref 16–45)
Ferritin: 29 ng/mL (ref 16–232)
Iron: 74 ug/dL (ref 45–160)
TIBC: 276 mcg/dL (calc) (ref 250–450)

## 2022-03-05 ENCOUNTER — Telehealth: Payer: Self-pay | Admitting: Hematology and Oncology

## 2022-03-05 ENCOUNTER — Other Ambulatory Visit: Payer: Self-pay | Admitting: Family Medicine

## 2022-03-05 DIAGNOSIS — D72819 Decreased white blood cell count, unspecified: Secondary | ICD-10-CM | POA: Insufficient documentation

## 2022-03-05 DIAGNOSIS — D696 Thrombocytopenia, unspecified: Secondary | ICD-10-CM | POA: Insufficient documentation

## 2022-03-05 NOTE — Telephone Encounter (Signed)
Scheduled appt per 7/5 referral. Pt is aware of appt date and time. Pt is aware to arrive 15 mins prior to appt time and to bring and updated insurance card. Pt is aware of appt location.   

## 2022-03-05 NOTE — Progress Notes (Signed)
I would like to have a blood specialist (hematologist) evaluate her due to trending low white blood cell count and platelets. I will refer her to them and she will receive a call from the [facility] cancer center.  Otherwise, her labs are fine and do suggest she is going through menopause. Let's see what her gynecologist says as well.

## 2022-03-27 ENCOUNTER — Inpatient Hospital Stay: Payer: Medicare HMO | Attending: Hematology and Oncology | Admitting: Hematology and Oncology

## 2022-03-27 ENCOUNTER — Inpatient Hospital Stay: Payer: Medicare HMO

## 2022-03-27 ENCOUNTER — Other Ambulatory Visit: Payer: Self-pay

## 2022-03-27 VITALS — BP 125/78 | HR 62 | Temp 98.1°F | Resp 15 | Wt 160.1 lb

## 2022-03-27 DIAGNOSIS — D696 Thrombocytopenia, unspecified: Secondary | ICD-10-CM | POA: Diagnosis not present

## 2022-03-27 DIAGNOSIS — D72818 Other decreased white blood cell count: Secondary | ICD-10-CM | POA: Diagnosis not present

## 2022-03-27 DIAGNOSIS — D72819 Decreased white blood cell count, unspecified: Secondary | ICD-10-CM | POA: Diagnosis not present

## 2022-03-27 DIAGNOSIS — Z79899 Other long term (current) drug therapy: Secondary | ICD-10-CM | POA: Insufficient documentation

## 2022-03-27 LAB — CMP (CANCER CENTER ONLY)
ALT: 14 U/L (ref 0–44)
AST: 18 U/L (ref 15–41)
Albumin: 4.3 g/dL (ref 3.5–5.0)
Alkaline Phosphatase: 66 U/L (ref 38–126)
Anion gap: 4 — ABNORMAL LOW (ref 5–15)
BUN: 16 mg/dL (ref 6–20)
CO2: 30 mmol/L (ref 22–32)
Calcium: 9.7 mg/dL (ref 8.9–10.3)
Chloride: 104 mmol/L (ref 98–111)
Creatinine: 0.77 mg/dL (ref 0.44–1.00)
GFR, Estimated: >60 mL/min (ref >60)
Glucose, Bld: 90 mg/dL (ref 70–99)
Potassium: 4.2 mmol/L (ref 3.5–5.1)
Sodium: 138 mmol/L (ref 135–145)
Total Bilirubin: 0.4 mg/dL (ref 0.3–1.2)
Total Protein: 7.9 g/dL (ref 6.5–8.1)

## 2022-03-27 LAB — HEPATITIS B SURFACE ANTIGEN: Hepatitis B Surface Ag: NONREACTIVE

## 2022-03-27 LAB — CBC WITH DIFFERENTIAL (CANCER CENTER ONLY)
Abs Immature Granulocytes: 0.01 10*3/uL (ref 0.00–0.07)
Basophils Absolute: 0 10*3/uL (ref 0.0–0.1)
Basophils Relative: 1 %
Eosinophils Absolute: 0.1 10*3/uL (ref 0.0–0.5)
Eosinophils Relative: 4 %
HCT: 37.9 % (ref 36.0–46.0)
Hemoglobin: 12.5 g/dL (ref 12.0–15.0)
Immature Granulocytes: 0 %
Lymphocytes Relative: 36 %
Lymphs Abs: 1.1 10*3/uL (ref 0.7–4.0)
MCH: 28.6 pg (ref 26.0–34.0)
MCHC: 33 g/dL (ref 30.0–36.0)
MCV: 86.7 fL (ref 80.0–100.0)
Monocytes Absolute: 0.3 10*3/uL (ref 0.1–1.0)
Monocytes Relative: 8 %
Neutro Abs: 1.6 10*3/uL — ABNORMAL LOW (ref 1.7–7.7)
Neutrophils Relative %: 51 %
Platelet Count: 167 10*3/uL (ref 150–400)
RBC: 4.37 MIL/uL (ref 3.87–5.11)
RDW: 12.8 % (ref 11.5–15.5)
WBC Count: 3.1 10*3/uL — ABNORMAL LOW (ref 4.0–10.5)
nRBC: 0 % (ref 0.0–0.2)

## 2022-03-27 LAB — HEPATITIS C ANTIBODY: HCV Ab: NONREACTIVE

## 2022-03-27 LAB — TSH: TSH: 0.84 u[IU]/mL (ref 0.350–4.500)

## 2022-03-27 LAB — SEDIMENTATION RATE: Sed Rate: 10 mm/hr (ref 0–22)

## 2022-03-27 LAB — C-REACTIVE PROTEIN: CRP: 0.6 mg/dL (ref <1.0)

## 2022-03-27 LAB — FOLATE: Folate: 17 ng/mL (ref >5.9)

## 2022-03-27 LAB — HEPATITIS B SURFACE ANTIBODY,QUALITATIVE: Hep B S Ab: NONREACTIVE

## 2022-03-27 LAB — VITAMIN B12: Vitamin B-12: 674 pg/mL (ref 180–914)

## 2022-03-27 LAB — HIV ANTIBODY (ROUTINE TESTING W REFLEX): HIV Screen 4th Generation wRfx: NONREACTIVE

## 2022-03-27 LAB — HEPATITIS B CORE ANTIBODY, TOTAL: Hep B Core Total Ab: NONREACTIVE

## 2022-03-27 NOTE — Progress Notes (Signed)
[facility] [phone]   Fax:(336) [facility] NOTE  Patient Care Team: [name], NP-C as PCP - General (Family Medicine)  Hematological/Oncological History # Thrombocytopenia # Leukopenia 02/28/2022: WBC 2.9, Hgb 12.2, MCV 88.4, Plt 145 03/27/2022: establish care with Dr. Lorenso Courier   CHIEF COMPLAINTS/PURPOSE OF CONSULTATION:  "Thrombocytopenia/Leukopenia "  HISTORY OF PRESENTING ILLNESS:  Maria Carter 51 y.o. female with medical history significant for anxiety/depression who presents for evaluation of thrombocytopenia/leukopenia.   On review of the previous records Maria Carter has had a history of leukopenia dating back to 12/23/2015. At that time WBC 3.5, Hgb 11.1, MCV 82.4, and Plt 190. Her counts were last normal on 05/29/2014. Her last labs on 02/28/2022 showed WBC 2.9, Hgb 12.2, MCV 88.4, Plt 145.  Due to concern for this finding she was referred to our clinic for further evaluation and management.  On exam today Maria Carter reports that she has been well in the interim since her labs were last checked.  She notes has not had any issues with infections or urinary tract infections.  She reports it is been years since he has had been on antibiotics.  She is she is not having issues with bleeding, bruising, or dark stools.  She notes that she just restarted a medication called Pristiq after having been off the medication for months.  She notes that she has no dietary restrictions and eats red meat approximate 3-4 times per week.  She does enjoy eating liver and chicken gives her does well.  On further discussion she reports her mother is healthy but her father has dementia.  She knows she has a sister who had breast cancer and passed away.  She has 4 healthy children and 8 healthy grandchildren.  She notes that she does not smoke cigarettes or does not vape but occasionally smoke marijuana.  She does not drink any alcohol.  She is currently on  disability for anxiety/depression.  She notes her energy levels are low and she fatigues quite easily.  She is going through menopause and does have some periodic hot flashes.  She is otherwise not having any nausea, vomiting, diarrhea, or weight loss.  Her appetite is otherwise good.  Full 10 point ROS is listed below.  MEDICAL HISTORY:  Past Medical History:  Diagnosis Date   Anxiety    Depression     SURGICAL HISTORY: No past surgical history on file.  SOCIAL HISTORY: Social History   Socioeconomic History   Marital status: Single    Spouse name: Not on file   Number of children: Not on file   Years of education: Not on file   Highest education level: Not on file  Occupational History   Not on file  Tobacco Use   Smoking status: Never   Smokeless tobacco: Never  Vaping Use   Vaping Use: Never used  Substance and Sexual Activity   Alcohol use: Yes    Comment: social   Drug use: No   Sexual activity: Not Currently    Partners: Male  Other Topics Concern   Not on file  Social History Narrative   Not on file   Social Determinants of Health   Financial Resource Strain: Not on file  Food Insecurity: Not on file  Transportation Needs: Not on file  Physical Activity: Not on file  Stress: Not on file  Social Connections: Not on file  Intimate Partner Violence: Not on file    FAMILY HISTORY:  Family History  Problem Relation Age of Onset   Breast cancer Sister 19    ALLERGIES:  has No Known Allergies.  MEDICATIONS:  Current Outpatient Medications  Medication Sig Dispense Refill   cholecalciferol (VITAMIN D3) 25 MCG (1000 UNIT) tablet Take 1,000 Units by mouth daily.     desvenlafaxine (PRISTIQ) 100 MG 24 hr tablet Take 100 mg by mouth daily.     Multiple Vitamins-Minerals (MULTIVITAMIN ADULTS PO) Take 1 tablet by mouth daily.     vitamin B-12 (CYANOCOBALAMIN) 100 MCG tablet Take 100 mcg by mouth daily.     No current facility-administered medications for this  visit.    REVIEW OF SYSTEMS:   Constitutional: ( - ) fevers, ( - )  chills , ( - ) night sweats Eyes: ( - ) blurriness of vision, ( - ) double vision, ( - ) watery eyes Ears, nose, mouth, throat, and face: ( - ) mucositis, ( - ) sore throat Respiratory: ( - ) cough, ( - ) dyspnea, ( - ) wheezes Cardiovascular: ( - ) palpitation, ( - ) chest discomfort, ( - ) lower extremity swelling Gastrointestinal:  ( - ) nausea, ( - ) heartburn, ( - ) change in bowel habits Skin: ( - ) abnormal skin rashes Lymphatics: ( - ) new lymphadenopathy, ( - ) easy bruising Neurological: ( - ) numbness, ( - ) tingling, ( - ) new weaknesses Behavioral/Psych: ( - ) mood change, ( - ) new changes  All other systems were reviewed with the patient and are negative.  PHYSICAL EXAMINATION:  Vitals:   03/27/22 1317  BP: 125/78  Pulse: 62  Resp: 15  Temp: 98.1 F (36.7 C)  SpO2: 98%   Filed Weights   03/27/22 1317  Weight: 160 lb 1.6 oz (72.6 kg)    GENERAL: well appearing middle-aged African-American female in NAD  SKIN: skin color, texture, turgor are normal, no rashes or significant lesions EYES: conjunctiva are pink and non-injected, sclera clear LUNGS: clear to auscultation and percussion with normal breathing effort HEART: regular rate & rhythm and no murmurs and no lower extremity edema Musculoskeletal: no cyanosis of digits and no clubbing  PSYCH: alert & oriented x 3, fluent speech NEURO: no focal motor/sensory deficits  LABORATORY DATA:  I have reviewed the data as listed    Latest Ref Rng & Units 03/27/2022    1:59 PM 02/28/2022   10:49 AM 12/16/2021   12:42 PM  CBC  WBC 4.0 - 10.5 K/uL 3.1  2.9  3.3   Hemoglobin 12.0 - 15.0 g/dL 12.5  12.2  12.3   Hematocrit 36.0 - 46.0 % 37.9  38.2  39.9   Platelets 150 - 400 K/uL 167  145.0  158        Latest Ref Rng & Units 03/27/2022    1:59 PM 02/28/2022   10:49 AM 12/16/2021   12:42 PM  CMP  Glucose 70 - 99 mg/dL 90  90  105   BUN 6 - 20  mg/dL 16  15  13    Creatinine 0.44 - 1.00 mg/dL 0.77  0.76  0.65   Sodium 135 - 145 mmol/L 138  138  138   Potassium 3.5 - 5.1 mmol/L 4.2  4.5  4.4   Chloride 98 - 111 mmol/L 104  103  104   CO2 22 - 32 mmol/L 30  31  26    Calcium 8.9 - 10.3 mg/dL 9.7  9.4  9.6   Total  Protein 6.5 - 8.1 g/dL 7.9  7.2    Total Bilirubin 0.3 - 1.2 mg/dL 0.4  0.4    Alkaline Phos 38 - 126 U/L 66  58    AST 15 - 41 U/L 18  17    ALT 0 - 44 U/L 14  12       ASSESSMENT & PLAN Maria Carter 51 y.o. female with medical history significant for anxiety/depression who presents for evaluation of thrombocytopenia/leukopenia.   After review of the labs, review of the records, and discussion with the patient the patients findings are most consistent with leukopenia of unclear etiology.   The differential for neutropenia includes nutritional deficiency, inflammatory disorder, medication side effect, infectious etiology, or congenital condition (such as benign ethnic neutropenia). The patient is not taking any medications known to cause neutropenia. Workup for this condition includes viral serologies (HIV, Hep B and Hep C),  vitamin b12/folate, and inflammatory markers with ESR and CRP.    A common cause for low WBC is benign ethnic neutropenia (BEN). This is a condition whereby individuals of African or Mediterranean descent have lower WBC than the general population. The condition typically has an absolute neutrophil count (ANC) between 1000-1500 with no recurrent infections. Patient with this condition have normal functioning immune systems and have no consequences as a result of their low ANC. BEN is a diagnosis of exclusion, so it would require the full above workup to make.    # Leukopenia --repeat CBC and CMP  --infectious serology testing with Hep B, Hep C, and HIV  --nutritional evaluation with Vitamin b12, folate  --inflammatory workup with ESR and CRP  --RTC in 6 month's time or sooner if there is an issue  with the above labs.   # Thrombocytopenia --last Plt 145 on 02/28/2022. Suspect transient etiology. Will reassess today.   Orders Placed This Encounter  Procedures   CBC with Differential ([facility] Only)    Standing Status:   Future    Number of Occurrences:   1    Standing Expiration Date:   03/28/2023   CMP ([facility] only)    Standing Status:   Future    Number of Occurrences:   1    Standing Expiration Date:   03/28/2023   Hepatitis B core antibody, total    Standing Status:   Future    Number of Occurrences:   1    Standing Expiration Date:   03/27/2023   Hepatitis C antibody    Standing Status:   Future    Number of Occurrences:   1    Standing Expiration Date:   03/27/2023   Hepatitis B surface antigen    Standing Status:   Future    Number of Occurrences:   1    Standing Expiration Date:   03/27/2023   Hepatitis B surface antibody    Standing Status:   Future    Number of Occurrences:   1    Standing Expiration Date:   03/27/2023   HIV antibody (with reflex)    Standing Status:   Future    Number of Occurrences:   1    Standing Expiration Date:   03/27/2023   Vitamin B12    Standing Status:   Future    Number of Occurrences:   1    Standing Expiration Date:   03/27/2023   Folate, Serum    Standing Status:   Future    Number of Occurrences:  1    Standing Expiration Date:   03/27/2023   Sedimentation rate    Standing Status:   Future    Number of Occurrences:   1    Standing Expiration Date:   03/27/2023   TSH    Standing Status:   Future    Number of Occurrences:   1    Standing Expiration Date:   03/28/2023   C-reactive protein    Standing Status:   Future    Number of Occurrences:   1    Standing Expiration Date:   03/27/2023    All questions were answered. The patient knows to call the clinic with any problems, questions or concerns.  A total of more than 60 minutes were spent on this encounter with face-to-face time and non-face-to-face time,  including preparing to see the patient, ordering tests and/or medications, counseling the patient and coordination of care as outlined above.   Ledell Peoples, MD Department of Hematology/Oncology [facility] Phone: [phone] Pager: [phone] Email: [email or web address]  03/29/2022 4:55 PM

## 2022-03-31 ENCOUNTER — Telehealth: Payer: Self-pay | Admitting: *Deleted

## 2022-03-31 ENCOUNTER — Telehealth: Payer: Self-pay | Admitting: Physician Assistant

## 2022-03-31 NOTE — Telephone Encounter (Signed)
Per 7/31 in basket, message has been left

## 2022-03-31 NOTE — Telephone Encounter (Signed)
Received vm message from pt, returning my call from this morning regarding lab results. Attempted call back. No answer but was able to leave vm message for her to call back @ [phone] at her convenience

## 2022-03-31 NOTE — Telephone Encounter (Signed)
-----   Message from Orson Slick, MD sent at 03/29/2022  4:54 PM EDT ----- Please let Maria Carter know that we did not find any concerning abnormalities in her blood work.  Her platelets did return to normal levels.  Her white blood cell count is slightly higher than it was during her last check.  She does not have any evidence of a viral infection, nutritional deficiency, or inflammation causing her low white blood cell count.  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  Would recommend that she follow-up with [place] in 6 months time in order to assure that her blood counts have not worsened. ----- Message ----- From: Buel Ream, [facility] [place] Sent: 03/27/2022   2:22 PM EDT To: Orson Slick, MD

## 2022-03-31 NOTE — Telephone Encounter (Signed)
TCT patient regarding recent lab results. No answer and unable to leave vm message as her voice mailbox is full.

## 2022-04-01 ENCOUNTER — Telehealth: Payer: Self-pay

## 2022-04-01 NOTE — Telephone Encounter (Signed)
Patient called requesting results from recent labs. Returned call to pt. Advised pt of Dr, Dorsey's message below. No Questions. Patient appreciative of call.  Please let Maria Carter know that we did not find any concerning abnormalities in her blood work.  Her platelets did return to normal levels.  Her white blood cell count is slightly higher than it was during her last check.  She does not have any evidence of a viral infection, nutritional deficiency, or inflammation causing her low white blood cell count.  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  Would recommend that she follow-up with [place] in 6 months time in order to assure that her blood counts have not worsened.

## 2022-05-23 DIAGNOSIS — F339 Major depressive disorder, recurrent, unspecified: Secondary | ICD-10-CM | POA: Diagnosis not present

## 2022-05-23 DIAGNOSIS — F419 Anxiety disorder, unspecified: Secondary | ICD-10-CM | POA: Diagnosis not present

## 2022-05-23 DIAGNOSIS — Z79899 Other long term (current) drug therapy: Secondary | ICD-10-CM | POA: Diagnosis not present

## 2022-06-03 ENCOUNTER — Telehealth: Payer: Self-pay | Admitting: Family Medicine

## 2022-06-03 NOTE — Telephone Encounter (Signed)
Mail box was full when I called to schedule AWV-I with [facility]

## 2022-08-06 DIAGNOSIS — F419 Anxiety disorder, unspecified: Secondary | ICD-10-CM | POA: Diagnosis not present

## 2022-08-06 DIAGNOSIS — F339 Major depressive disorder, recurrent, unspecified: Secondary | ICD-10-CM | POA: Diagnosis not present

## 2022-08-06 DIAGNOSIS — Z79899 Other long term (current) drug therapy: Secondary | ICD-10-CM | POA: Diagnosis not present

## 2022-09-13 ENCOUNTER — Emergency Department (HOSPITAL_COMMUNITY)
Admission: EM | Admit: 2022-09-13 | Discharge: 2022-09-13 | Disposition: A | Discharge status: Home / Self Care | Payer: Medicare HMO | Attending: Emergency Medicine | Admitting: Emergency Medicine

## 2022-09-13 ENCOUNTER — Other Ambulatory Visit: Payer: Self-pay

## 2022-09-13 DIAGNOSIS — N6310 Unspecified lump in the right breast, unspecified quadrant: Secondary | ICD-10-CM | POA: Diagnosis not present

## 2022-09-13 DIAGNOSIS — N6311 Unspecified lump in the right breast, upper outer quadrant: Secondary | ICD-10-CM | POA: Diagnosis not present

## 2022-09-13 NOTE — ED Triage Notes (Signed)
Pt arrive with c/o a lump in her right breast. Per pt, it is not painful. Pt denies any discharge from nipple.

## 2022-09-13 NOTE — ED Provider Notes (Signed)
[facility] EMERGENCY DEPARTMENT Provider Note   CSN: [id number] Arrival date & time: 09/13/22  1915     History Chief Complaint  Patient presents with   Breast Problem    Maria Carter is a 52 y.o. female with history of anxiety and depression presents emergency room today for evaluation of lump found in the patient's right breast.  She reports that she noticed it today while she was in the shower.  She was concerned as her sister died of breast cancer some years prior.  She reports it has been at least 5 years since her last mammogram.  She does not have any fevers, night sweats, unintentional weight loss, chest pain, or discharge from her nipple.  She reports that she does have a primary care doctor.  HPI     Home Medications Prior to Admission medications   Medication Sig Start Date End Date Taking? Authorizing Provider  cholecalciferol (VITAMIN D3) 25 MCG (1000 UNIT) tablet Take 1,000 Units by mouth daily.    [provider]  desvenlafaxine (PRISTIQ) 100 MG 24 hr tablet Take 100 mg by mouth daily. 11/26/21   [provider]  Multiple Vitamins-Minerals (MULTIVITAMIN ADULTS PO) Take 1 tablet by mouth daily.    [provider]  vitamin B-12 (CYANOCOBALAMIN) 100 MCG tablet Take 100 mcg by mouth daily.    [provider]      Allergies    Patient has no known allergies.    Review of Systems   Review of Systems  Constitutional:  Negative for fever and unexpected weight change.  See HPI  Physical Exam Updated Vital Signs BP 134/70 (BP Location: Right Arm)   Pulse 60   Temp 98.5 F (36.9 C) (Oral)   Resp 18   Ht '5\' 1"'$  (1.549 m)   Wt 72.6 kg   SpO2 100%   BMI 30.23 kg/m  Physical Exam Vitals and nursing note reviewed. Exam conducted with a chaperone present Loree Fee, Therapist, sports).  Constitutional:      General: She is not in acute distress.    Appearance: Normal appearance. She is not ill-appearing or toxic-appearing.   Eyes:     General: No scleral icterus. Pulmonary:     Effort: Pulmonary effort is normal. No respiratory distress.  Chest:  Breasts:    Right: Mass present. No inverted nipple, nipple discharge or skin change.     Comments: Approximately Kiwi sized mobile mass palpated in the right upper outer quadrant of the breast.  No nipple discharge expressed.  No inverted nipple seen.  No overlying erythema or warmth to the breast. Skin:    General: Skin is dry.     Findings: No rash.  Neurological:     General: No focal deficit present.     Mental Status: She is alert. Mental status is at baseline.  Psychiatric:        Mood and Affect: Mood normal.     ED Results / Procedures / Treatments   Labs (all labs ordered are listed, but only abnormal results are displayed) Labs Reviewed - No data to display  EKG None  Radiology No results found.  Procedures Procedures   Medications Ordered in ED Medications - No data to display  ED Course/ Medical Decision Making/ A&P                           Medical Decision Making Amount and/or Complexity of Data Reviewed Radiology: ordered.  52 year old female presents the emergency room today for evaluation of breast mass.  Differential diagnosis includes is limited to malignancy, abscess, fibroadenoma, fibrocystic changes, lipoma, cyst.  Vital signs are unremarkable.  Physical exam as noted above.  At this time, I do not think that there is any emergent general surgery intervention.  Patient is afebrile, not tachycardic.  There is no overlying erythema or warmth.  No active discharge or wounds seen to the breast.  Patient denies any B symptoms of fever, night sweats, unintentional weight loss.  She does have a primary relative that died of breast cancer.  Concerning as she has not had any breast cancer screening recently as well.  She does have a primary care doctor that she can follow-up with.  In the meantime, I am going to refer her to the  breast center.  The information for the breast center listed onto the discharge paperwork.  I also put in an order for an ultrasound as well.  I advised her to call her primary care doctor about this on the next visit and stay to see if she can help her with getting a sooner appointment given the patient's family history.  We discussed return precautions and red flag symptoms.  Patient verbalizes understanding and agrees the plan.  Patient is stable be discharged home in good condition.  I discussed this case with my attending physician who cosigned this note including patient's presenting symptoms, physical exam, and planned diagnostics and interventions. Attending physician stated agreement with plan or made changes to plan which were implemented.   Final Clinical Impression(s) / ED Diagnoses Final diagnoses:  Mass of upper outer quadrant of right breast    Rx / DC Orders ED Discharge Orders          Ordered    US BREAST COMPLETE UNI RIGHT INC AXILLA        09/13/22 2050              Sherrell Puller, PA-C 09/16/22 2020    Lorelle Gibbs, DO 09/23/22 [phone]

## 2022-09-13 NOTE — Discharge Instructions (Addendum)
I have included information for the ultrasound has been sent in for you.  Have included information for the breast center.  Please make sure you call them soon as possible to schedule an appointment.  Please make sure you mention your family history as this is vital.  You may also follow-up with your primary care doctor to see if you get an appointment sooner for mammogram or ultrasound.  If you have any concerns, new or worsening symptoms, please return to the nearest emergency room for evaluation.

## 2022-09-22 ENCOUNTER — Other Ambulatory Visit: Payer: Self-pay | Admitting: Emergency Medicine

## 2022-09-22 ENCOUNTER — Other Ambulatory Visit: Payer: Self-pay | Admitting: Family Medicine

## 2022-09-22 DIAGNOSIS — N6311 Unspecified lump in the right breast, upper outer quadrant: Secondary | ICD-10-CM

## 2022-09-23 ENCOUNTER — Other Ambulatory Visit: Payer: Medicare HMO

## 2022-09-24 ENCOUNTER — Encounter: Payer: Self-pay | Admitting: Family Medicine

## 2022-09-24 ENCOUNTER — Ambulatory Visit (INDEPENDENT_AMBULATORY_CARE_PROVIDER_SITE_OTHER): Payer: Medicare HMO | Admitting: Family Medicine

## 2022-09-24 VITALS — BP 100/64 | HR 65 | Temp 97.6°F | Ht 61.0 in | Wt 163.0 lb

## 2022-09-24 DIAGNOSIS — N6311 Unspecified lump in the right breast, upper outer quadrant: Secondary | ICD-10-CM

## 2022-09-24 DIAGNOSIS — Z803 Family history of malignant neoplasm of breast: Secondary | ICD-10-CM

## 2022-09-24 NOTE — Progress Notes (Signed)
Subjective:     Patient ID: Maria Carter, female    DOB: Jun 14, 1971, 52 y.o.   MRN: [id number]  Chief Complaint  Patient presents with   Referral    Took a shower and noticed she has a lump on right side of right breast. Was not able to get referral at hospital when she went    HPI Patient is in today for a mass of right breast. She noticed this last week in the shower.  Her last mammogram was approx 5 years ago.   She evaluated in the ED for this.   Sister passed away from breast cancer.   Denies fever, chills, night sweats, unexplained weight loss, dizziness, chest pain, palpitations, shortness of breath, abdominal pain, N/V/D.   Health Maintenance Due  Topic Date Due   Medicare Annual Wellness (AWV)  Never done   COVID-19 Vaccine (1) Never done   DTaP/Tdap/Td (1 - Tdap) Never done   Zoster Vaccines- Shingrix (1 of 2) Never done   PAP SMEAR-Modifier  05/09/2013   COLONOSCOPY (Pts 45-2yr Insurance coverage will need to be confirmed)  Never done   MAMMOGRAM  01/23/2022   INFLUENZA VACCINE  Never done    Past Medical History:  Diagnosis Date   Anxiety    Depression     History reviewed. No pertinent surgical history.  Family History  Problem Relation Age of Onset   Breast cancer Sister 447   Social History   Socioeconomic History   Marital status: Single    Spouse name: Not on file   Number of children: Not on file   Years of education: Not on file   Highest education level: Not on file  Occupational History   Not on file  Tobacco Use   Smoking status: Never   Smokeless tobacco: Never  Vaping Use   Vaping Use: Never used  Substance and Sexual Activity   Alcohol use: Yes    Comment: social   Drug use: No   Sexual activity: Not Currently    Partners: Male  Other Topics Concern   Not on file  Social History Narrative   Not on file   Social Determinants of Health   Financial Resource Strain: Not on file  Food Insecurity: Not on file   Transportation Needs: Not on file  Physical Activity: Not on file  Stress: Not on file  Social Connections: Not on file  Intimate Partner Violence: Not on file    Outpatient Medications Prior to Visit  Medication Sig Dispense Refill   cholecalciferol (VITAMIN D3) 25 MCG (1000 UNIT) tablet Take 1,000 Units by mouth daily.     desvenlafaxine (PRISTIQ) 100 MG 24 hr tablet Take 100 mg by mouth daily.     Multiple Vitamins-Minerals (MULTIVITAMIN ADULTS PO) Take 1 tablet by mouth daily.     vitamin B-12 (CYANOCOBALAMIN) 100 MCG tablet Take 100 mcg by mouth daily.     No facility-administered medications prior to visit.    No Known Allergies  ROS     Objective:    Physical Exam Exam conducted with a chaperone present.  Constitutional:      General: She is not in acute distress.    Appearance: She is not ill-appearing.  Cardiovascular:     Rate and Rhythm: Normal rate.  Pulmonary:     Effort: Pulmonary effort is normal.  Chest:  Breasts:    Right: Mass and tenderness present. No nipple discharge or skin change.  Left: Normal.       Comments: Large, firm mass, slightly TTP of right upper outer quadrant of right breast. No skin changes, nipple inversion or discharge.  Lymphadenopathy:     Upper Body:     Right upper body: No supraclavicular or axillary adenopathy.     Left upper body: No supraclavicular or axillary adenopathy.  Skin:    General: Skin is warm and dry.  Neurological:     General: No focal deficit present.     Mental Status: She is alert and oriented to person, place, and time.  Psychiatric:        Mood and Affect: Mood normal.        Behavior: Behavior normal.     BP 100/64 (BP Location: Left Arm, Patient Position: Sitting, Cuff Size: Large)   Pulse 65   Temp 97.6 F (36.4 C) (Temporal)   Ht '5\' 1"'$  (1.549 m)   Wt 163 lb (73.9 kg)   SpO2 98%   BMI 30.80 kg/m  Wt Readings from Last 3 Encounters:  09/24/22 163 lb (73.9 kg)  09/13/22 160 lb (72.6  kg)  03/27/22 160 lb 1.6 oz (72.6 kg)       Assessment & Plan:   Problem List Items Addressed This Visit   None Visit Diagnoses     Mass of upper outer quadrant of right breast    -  Primary   Relevant Orders   US BREAST LTD UNI RIGHT INC AXILLA   MM DIAG BREAST TOMO BILATERAL   Family history of breast cancer in sister       Relevant Orders   US BREAST LTD UNI RIGHT INC AXILLA   MM DIAG BREAST TOMO BILATERAL      Urgent referral for diagnostic mammogram and breast US of right breast. Follow up pending results.   I am having Prescilla Sours maintain her desvenlafaxine, Multiple Vitamins-Minerals (MULTIVITAMIN ADULTS PO), vitamin B-12, and cholecalciferol.  No orders of the defined types were placed in this encounter.

## 2022-09-26 ENCOUNTER — Other Ambulatory Visit: Payer: Self-pay | Admitting: Family Medicine

## 2022-09-26 ENCOUNTER — Ambulatory Visit
Admission: RE | Admit: 2022-09-26 | Discharge: 2022-09-26 | Disposition: A | Discharge status: Home / Self Care | Payer: Medicare HMO | Source: Ambulatory Visit | Attending: Family Medicine | Admitting: Family Medicine

## 2022-09-26 DIAGNOSIS — Z803 Family history of malignant neoplasm of breast: Secondary | ICD-10-CM

## 2022-09-26 DIAGNOSIS — N6311 Unspecified lump in the right breast, upper outer quadrant: Secondary | ICD-10-CM

## 2022-09-26 DIAGNOSIS — R928 Other abnormal and inconclusive findings on diagnostic imaging of breast: Secondary | ICD-10-CM | POA: Diagnosis not present

## 2022-09-30 ENCOUNTER — Ambulatory Visit
Admission: RE | Admit: 2022-09-30 | Discharge: 2022-09-30 | Disposition: A | Discharge status: Home / Self Care | Payer: Medicare HMO | Source: Ambulatory Visit | Attending: Family Medicine | Admitting: Family Medicine

## 2022-09-30 ENCOUNTER — Other Ambulatory Visit: Payer: Self-pay | Admitting: Physician Assistant

## 2022-09-30 DIAGNOSIS — N6311 Unspecified lump in the right breast, upper outer quadrant: Secondary | ICD-10-CM

## 2022-09-30 DIAGNOSIS — C50411 Malignant neoplasm of upper-outer quadrant of right female breast: Secondary | ICD-10-CM | POA: Diagnosis not present

## 2022-09-30 DIAGNOSIS — Z803 Family history of malignant neoplasm of breast: Secondary | ICD-10-CM

## 2022-09-30 DIAGNOSIS — D72818 Other decreased white blood cell count: Secondary | ICD-10-CM

## 2022-09-30 HISTORY — PX: BREAST BIOPSY: SHX20

## 2022-10-01 ENCOUNTER — Inpatient Hospital Stay (HOSPITAL_BASED_OUTPATIENT_CLINIC_OR_DEPARTMENT_OTHER): Payer: Medicare HMO | Admitting: Physician Assistant

## 2022-10-01 ENCOUNTER — Inpatient Hospital Stay: Payer: Medicare HMO | Attending: Physician Assistant

## 2022-10-01 VITALS — BP 140/60 | HR 70 | Temp 97.3°F | Resp 18 | Wt 161.0 lb

## 2022-10-01 DIAGNOSIS — D696 Thrombocytopenia, unspecified: Secondary | ICD-10-CM | POA: Insufficient documentation

## 2022-10-01 DIAGNOSIS — Z79899 Other long term (current) drug therapy: Secondary | ICD-10-CM | POA: Diagnosis not present

## 2022-10-01 DIAGNOSIS — D72818 Other decreased white blood cell count: Secondary | ICD-10-CM

## 2022-10-01 DIAGNOSIS — D72819 Decreased white blood cell count, unspecified: Secondary | ICD-10-CM | POA: Diagnosis not present

## 2022-10-01 DIAGNOSIS — D708 Other neutropenia: Secondary | ICD-10-CM

## 2022-10-01 LAB — CMP (CANCER CENTER ONLY)
ALT: 9 U/L (ref 0–44)
AST: 18 U/L (ref 15–41)
Albumin: 4.3 g/dL (ref 3.5–5.0)
Alkaline Phosphatase: 64 U/L (ref 38–126)
Anion gap: 4 — ABNORMAL LOW (ref 5–15)
BUN: 15 mg/dL (ref 6–20)
CO2: 29 mmol/L (ref 22–32)
Calcium: 10 mg/dL (ref 8.9–10.3)
Chloride: 104 mmol/L (ref 98–111)
Creatinine: 0.78 mg/dL (ref 0.44–1.00)
GFR, Estimated: >60 mL/min
Glucose, Bld: 94 mg/dL (ref 70–99)
Potassium: 4.1 mmol/L (ref 3.5–5.1)
Sodium: 137 mmol/L (ref 135–145)
Total Bilirubin: 0.6 mg/dL (ref 0.3–1.2)
Total Protein: 8.1 g/dL (ref 6.5–8.1)

## 2022-10-01 LAB — CBC WITH DIFFERENTIAL (CANCER CENTER ONLY)
Abs Immature Granulocytes: 0.02 10*3/uL (ref 0.00–0.07)
Basophils Absolute: 0 10*3/uL (ref 0.0–0.1)
Basophils Relative: 1 %
Eosinophils Absolute: 0.3 10*3/uL (ref 0.0–0.5)
Eosinophils Relative: 9 %
HCT: 39.3 % (ref 36.0–46.0)
Hemoglobin: 13.2 g/dL (ref 12.0–15.0)
Immature Granulocytes: 1 %
Lymphocytes Relative: 33 %
Lymphs Abs: 1 10*3/uL (ref 0.7–4.0)
MCH: 29.3 pg (ref 26.0–34.0)
MCHC: 33.6 g/dL (ref 30.0–36.0)
MCV: 87.3 fL (ref 80.0–100.0)
Monocytes Absolute: 0.3 10*3/uL (ref 0.1–1.0)
Monocytes Relative: 9 %
Neutro Abs: 1.4 10*3/uL — ABNORMAL LOW (ref 1.7–7.7)
Neutrophils Relative %: 47 %
Platelet Count: 149 10*3/uL — ABNORMAL LOW (ref 150–400)
RBC: 4.5 MIL/uL (ref 3.87–5.11)
RDW: 12.3 % (ref 11.5–15.5)
Smear Review: NORMAL
WBC Count: 3 10*3/uL — ABNORMAL LOW (ref 4.0–10.5)
nRBC: 0 % (ref 0.0–0.2)

## 2022-10-02 ENCOUNTER — Telehealth: Payer: Self-pay | Admitting: *Deleted

## 2022-10-02 NOTE — Telephone Encounter (Signed)
Left message for patient to return my call in reference to upcoming clinic appointment on 2/7

## 2022-10-03 NOTE — Progress Notes (Unsigned)
[facility] Telephone:([phone]   [phone]) [phone]  PROGRESS NOTE  Patient Care Team: [name], NP-C as PCP - General (Family Medicine)  Hematological/Oncological History # Thrombocytopenia # Leukopenia 02/28/2022: WBC 2.9, Hgb 12.2, MCV 88.4, Plt 145 03/27/2022: establish care with Dr. Lorenso Courier   CHIEF COMPLAINTS/PURPOSE OF CONSULTATION:  "Thrombocytopenia/Leukopenia "  HISTORY OF PRESENTING ILLNESS:  Maria Carter 52 y.o. female returns for a follow up for thrombocytopenia/leukopenia. She is unaccompanied for this visit.   On exam today Maria Carter reports feeling very nervous since undergoing right breast biopsy on 09/30/22 after she noticed a lump on the right side of her right breast last week in the shower. She just got notification from her PCP that the biopsy came back as positive for cancer. Ms. Brienza had a lot of questions about the next steps for her cancer diagnosis.   She is otherwise feeling okay without any other concerning symptoms. Her energy levels are stable and she is able to complete all her daily activities on her own.  She denies any appetite changes or weight loss.  She denies nausea, vomiting or abdominal pain.  Her bowel habits are unchanged without recurrent episodes of diarrhea or constipation.  She denies easy bruising or signs of active bleeding.  Patient has no signs of infection including fevers, chills, sweats, shortness of breath, chest pain or cough.  She has no other complaints.  Rest of the 10 point ROS is below.  MEDICAL HISTORY:  Past Medical History:  Diagnosis Date   Anxiety    Depression     SURGICAL HISTORY: Past Surgical History:  Procedure Laterality Date   BREAST BIOPSY Right 09/30/2022   [place] RT BREAST BX W LOC DEV 1ST LESION IMG BX SPEC US GUIDE 09/30/2022 [facility] MAMMOGRAP[facility]    SOCIAL HISTORY: Social History   Socioeconomic History   Marital status: Single    Spouse name: Not on file   Number of  children: Not on file   Years of education: Not on file   Highest education level: Not on file  Occupational History   Not on file  Tobacco Use   Smoking status: Never   Smokeless tobacco: Never  Vaping Use   Vaping Use: Never used  Substance and Sexual Activity   Alcohol use: Yes    Comment: social   Drug use: No   Sexual activity: Not Currently    Partners: Male  Other Topics Concern   Not on file  Social History Narrative   Not on file   Social Determinants of Health   Financial Resource Strain: Not on file  Food Insecurity: Not on file  Transportation Needs: Not on file  Physical Activity: Not on file  Stress: Not on file  Social Connections: Not on file  Intimate Partner Violence: Not on file    FAMILY HISTORY: Family History  Problem Relation Age of Onset   Breast cancer Sister 84    ALLERGIES:  has No Known Allergies.  MEDICATIONS:  Current Outpatient Medications  Medication Sig Dispense Refill   cholecalciferol (VITAMIN D3) 25 MCG (1000 UNIT) tablet Take 1,000 Units by mouth daily.     desvenlafaxine (PRISTIQ) 100 MG 24 hr tablet Take 100 mg by mouth daily.     Multiple Vitamins-Minerals (MULTIVITAMIN ADULTS PO) Take 1 tablet by mouth daily.     vitamin B-12 (CYANOCOBALAMIN) 100 MCG tablet Take 100 mcg by mouth daily.     No current facility-administered medications for this visit.  REVIEW OF SYSTEMS:   Constitutional: ( - ) fevers, ( - )  chills , ( - ) night sweats Eyes: ( - ) blurriness of vision, ( - ) double vision, ( - ) watery eyes Ears, nose, mouth, throat, and face: ( - ) mucositis, ( - ) sore throat Respiratory: ( - ) cough, ( - ) dyspnea, ( - ) wheezes Cardiovascular: ( - ) palpitation, ( - ) chest discomfort, ( - ) lower extremity swelling Gastrointestinal:  ( - ) nausea, ( - ) heartburn, ( - ) change in bowel habits Skin: ( - ) abnormal skin rashes Lymphatics: ( - ) new lymphadenopathy, ( - ) easy bruising Neurological: ( - )  numbness, ( - ) tingling, ( - ) new weaknesses Behavioral/Psych: ( - ) mood change, ( - ) new changes  All other systems were reviewed with the patient and are negative.  PHYSICAL EXAMINATION:  Vitals:   10/01/22 1051  BP: (!) 140/60  Pulse: 70  Resp: 18  Temp: (!) 97.3 F (36.3 C)  SpO2: 99%   Filed Weights   10/01/22 1051  Weight: 161 lb (73 kg)    GENERAL: well appearing middle-aged African-American female in NAD  SKIN: skin color, texture, turgor are normal, no rashes or significant lesions EYES: conjunctiva are pink and non-injected, sclera clear LUNGS: clear to auscultation and percussion with normal breathing effort HEART: regular rate & rhythm and no murmurs and no lower extremity edema Musculoskeletal: no cyanosis of digits and no clubbing  PSYCH: alert & oriented x 3, fluent speech NEURO: no focal motor/sensory deficits  LABORATORY DATA:  I have reviewed the data as listed    Latest Ref Rng & Units 10/01/2022    9:58 AM 03/27/2022    1:59 PM 02/28/2022   10:49 AM  CBC  WBC 4.0 - 10.5 K/uL 3.0  3.1  2.9   Hemoglobin 12.0 - 15.0 g/dL 13.2  12.5  12.2   Hematocrit 36.0 - 46.0 % 39.3  37.9  38.2   Platelets 150 - 400 K/uL 149  167  145.0        Latest Ref Rng & Units 10/01/2022    9:22 AM 03/27/2022    1:59 PM 02/28/2022   10:49 AM  CMP  Glucose 70 - 99 mg/dL 94  90  90   BUN 6 - 20 mg/dL '15  16  15   '$ Creatinine 0.44 - 1.00 mg/dL 0.78  0.77  0.76   Sodium 135 - 145 mmol/L 137  138  138   Potassium 3.5 - 5.1 mmol/L 4.1  4.2  4.5   Chloride 98 - 111 mmol/L 104  104  103   CO2 22 - 32 mmol/L '29  30  31   '$ Calcium 8.9 - 10.3 mg/dL 10.0  9.7  9.4   Total Protein 6.5 - 8.1 g/dL 8.1  7.9  7.2   Total Bilirubin 0.3 - 1.2 mg/dL 0.6  0.4  0.4   Alkaline Phos 38 - 126 U/L 64  66  58   AST 15 - 41 U/L '18  18  17   '$ ALT 0 - 44 U/L '9  14  12      '$ ASSESSMENT & PLAN Maria Carter returns for a follow up for neutropenia and thrombocytopenia.  #Right breast invasive  ductal carcinoma: --Mammogram from 09/26/2022 showed 3 cm upper-outer right breast mass and an abnormal right axillary lymph node with cortical thickening. --Underwent biopsy on 09/30/2022.  Grade 3, prognostic panel pending. Abnormal-appearing lymph node could not be identified in right axilla on day of biopsy.  --Scheduled for breast MDC on 10/08/2022.  I discussed biopsy results with Dr. Chryl Heck, breast oncologist.  #Neutropenia --Workup from 03/27/2022 showed no evidence of a viral infection, nutritional deficiency, or inflammation causing her low white blood cell count. 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  --Continue to monitor.  --No indication for bone marrow biopsy at this time.   # Thrombocytopenia --Suspect transient etiology. Plt is 149K today.  --No signs of bleeding/bruising. --Continue to monitor.   Orders Placed This Encounter  Procedures   CBC with Differential ([place] Only)    Standing Status:   Future    Number of Occurrences:   1    Standing Expiration Date:   10/02/2023    All questions were answered. The patient knows to call the clinic with any problems, questions or concerns.  I have spent a total of 30 minutes minutes of face-to-face and non-face-to-face time, preparing to see the patient,  performing a medically appropriate examination, counseling and educating the patient, ordering tests/procedures, referring and communicating with other health care professionals, documenting clinical information in the electronic health record, and care coordination.   Dede Query PA-C Dept of Hematology and [facility] Phone: [phone]   10/03/2022 9:59 AM

## 2022-10-06 ENCOUNTER — Encounter: Payer: Self-pay | Admitting: *Deleted

## 2022-10-06 ENCOUNTER — Encounter: Payer: Self-pay | Admitting: Family Medicine

## 2022-10-06 DIAGNOSIS — C50411 Malignant neoplasm of upper-outer quadrant of right female breast: Secondary | ICD-10-CM

## 2022-10-07 NOTE — Progress Notes (Signed)
Radiation Oncology         [phone] ________________________________  Name: Maria Carter        MRN: [id number]  Date of Service: 10/08/2022 DOB: [birth year]  [id number], Laurian Brim, NP-C  Coralie Keens, MD     REFERRING PHYSICIAN: Coralie Keens, MD   DIAGNOSIS: The encounter diagnosis was Malignant neoplasm of upper-outer quadrant of right female breast, unspecified estrogen receptor status ([place]).   HISTORY OF PRESENT ILLNESS: Maria Carter is a 52 y.o. female seen in the multidisciplinary breast clinic for a new diagnosis of right breast cancer.   The patient was noted to have a palpable mass in the right breast. She was found to have a 3 cm mass by ultrasound in the 10:00 position. Her right axillary lymph node was enlarged but was not reproducible at the time of biopsy.   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 She's seen today to discuss treatment recommendations of her cancer.   PREVIOUS RADIATION THERAPY: No   PAST MEDICAL HISTORY:  Past Medical History:  Diagnosis Date   Anxiety    Breast cancer ([place])    Depression        PAST SURGICAL HISTORY: Past Surgical History:  Procedure Laterality Date   BREAST BIOPSY Right 09/30/2022   [place] RT BREAST BX W LOC DEV 1ST LESION IMG BX SPEC US GUIDE 09/30/2022 [facility] MAMMOGRAPHY     FAMILY HISTORY:  Family History  Problem Relation Age of Onset   Breast cancer Sister 39     SOCIAL HISTORY:  reports that she has never smoked. She has never used smokeless tobacco. She reports current alcohol use. She reports that she does not use drugs. The patient is Single and lives in [place]. She is on disability for her depression. She's accompanied by her partner [name]. She enjoys dancing, fishing, and spending time with family.    ALLERGIES: Patient has no known allergies.   MEDICATIONS:  Current Outpatient Medications   Medication Sig Dispense Refill   ARIPiprazole (ABILIFY) 2 MG tablet Take 1/2 tablet daily for one week then take 1 tablet daily     busPIRone (BUSPAR) 15 MG tablet Take by mouth.     cholecalciferol (VITAMIN D3) 25 MCG (1000 UNIT) tablet Take 1,000 Units by mouth daily.     desvenlafaxine (PRISTIQ) 100 MG 24 hr tablet Take 100 mg by mouth daily.     Multiple Vitamins-Minerals (MULTIVITAMIN ADULTS PO) Take 1 tablet by mouth daily.     vitamin B-12 (CYANOCOBALAMIN) 100 MCG tablet Take 100 mcg by mouth daily.     No current facility-administered medications for this encounter.     REVIEW OF SYSTEMS: On review of systems, the patient reports that she is doing well with navigating her diagnosis so far with the information she's received during her visit today.     PHYSICAL EXAM:  Wt Readings from Last 3 Encounters:  10/08/22 157 lb 6.4 oz (71.4 kg)  10/01/22 161 lb (73 kg)  09/24/22 163 lb (73.9 kg)   Temp Readings from Last 3 Encounters:  10/08/22 98.2 F (36.8 C) (Temporal)  10/01/22 (!) 97.3 F (36.3 C) (Temporal)  09/24/22 97.6 F (36.4 C) (Temporal)   BP Readings from Last 3 Encounters:  10/08/22 (!) 144/74  10/01/22 (!) 140/60  09/24/22 100/64   Pulse Readings from Last 3 Encounters:  10/08/22 64  10/01/22 70  09/24/22 65    In general this is a well appearing African American female in no acute distress. She's alert and oriented x4 and appropriate throughout the examination. Cardiopulmonary assessment is negative for acute distress and she exhibits normal effort. Bilateral breast exam is deferred. However she has a visible keloid scar centrally in the chest that is about 3-4 cm in greatest dimension. She mentions this resulted after a cyst or boil in that area. Her keloid has been removed previously but not treated with steroids or radiation. She is interested in removal if possible.     ECOG = 0  0 - Asymptomatic (Fully active, able to carry on all predisease  activities without restriction)  1 - Symptomatic but completely ambulatory (Restricted in physically strenuous activity but ambulatory and able to carry out work of a light or sedentary nature. For example, light housework, office work)  2 - Symptomatic, <50% in bed during the day (Ambulatory and capable of all self care but unable to carry out any work activities. Up and about more than 50% of waking hours)  3 - Symptomatic, >50% in bed, but not bedbound (Capable of only limited self-care, confined to bed or chair 50% or more of waking hours)  4 - Bedbound (Completely disabled. Cannot carry on any self-care. Totally confined to bed or chair)  5 - Death   Eustace Pen MM, Creech RH, Tormey DC, et al. [phone]). "Toxicity and response criteria of the [facility]". [place] Oncol. 5 (6): 649-55    LABORATORY DATA:  Lab Results  Component Value Date   WBC 2.8 (L) 10/08/2022   HGB 12.4 10/08/2022   HCT 37.9 10/08/2022   MCV 86.3 10/08/2022   PLT 155 10/08/2022   Lab Results  Component Value Date   NA 139 10/08/2022   K 3.5 10/08/2022   CL 105 10/08/2022   CO2 29 10/08/2022   Lab Results  Component Value Date   ALT 11 10/08/2022   AST 17 10/08/2022   ALKPHOS 62 10/08/2022   BILITOT 0.5 10/08/2022      RADIOGRAPHY: [place] RT BREAST BX W LOC DEV 1ST LESION IMG BX SPEC US GUIDE  Addendum Date: 10/01/2022   ADDENDUM REPORT: 10/01/2022 13:01 ADDENDUM: Pathology revealed GRADE III INVASIVE DUCTAL CARCINOMA WITH GEOGRAPHIC NECROSIS of the RIGHT breast, 10 o'clock, (coil clip). This was found to be concordant by Dr. Franki Cabot. Pathology results were discussed with the patient in person by Dede Query, PA-C with [facility] on October 01, 2022. The patient reported doing well after the biopsy with tenderness at the site. Post biopsy instructions and care were reviewed and questions were answered. The patient was encouraged to call [facility] for any additional concerns. My direct phone number was provided. The patient was referred to [facility] on October 08, 2022. NOTE: An abnormal-appearing lymph node could not be identified in the RIGHT axilla on the day of biopsy. As such, only the RIGHT breast mass was biopsied. Pathology results reported by Terie Purser, RN on 10/01/2022. Electronically Signed   By: Franki Cabot M.D.   On: 10/01/2022 13:01   Result Date: 10/01/2022 CLINICAL DATA:  Patient presents today for ultrasound-guided biopsy of a suspicious mass in the RIGHT breast. Patient also presented today for ultrasound-guided biopsy of an enlarged/morphologically abnormal lymph node in the RIGHT axilla, however, the abnormal-appearing lymph node could not  be reproduced today (possibly a blood vessel seen on-end creating an artifactual appearance of an enlarged lymph node). EXAM: ULTRASOUND GUIDED RIGHT BREAST CORE NEEDLE BIOPSY COMPARISON:  Previous exam(s). PROCEDURE: I met with the patient and we discussed the procedure of ultrasound-guided biopsy, including benefits and alternatives. We discussed the high likelihood of a successful procedure. We discussed the risks of the procedure, including infection, bleeding, tissue injury, clip migration, and inadequate sampling. Informed written consent was given. The usual time-out protocol was performed immediately prior to the procedure. As above, an abnormal-appearing lymph node could not be identified in the RIGHT axilla on today's preprocedure ultrasound evaluation. As such, only the RIGHT breast mass was biopsied today. Lesion quadrant: Upper outer quadrant Using sterile technique and 1% Lidocaine as local anesthetic, under direct ultrasound visualization, a 12 gauge spring-loaded device was used to perform biopsy of the RIGHT breast mass at the 10 o'clock axis using a lateral approach. At the conclusion  of the procedure, a coil shaped tissue marker clip was deployed into the biopsy cavity. Follow up 2 view mammogram was performed and dictated separately. IMPRESSION: 1. Ultrasound guided biopsy of the RIGHT breast mass at the 10 o'clock axis. No apparent complications. 2. An abnormal-appearing lymph node could not be identified in the RIGHT axilla today. As such, only the RIGHT breast mass was biopsied today. Electronically Signed: By: Franki Cabot M.D. On: 09/30/2022 14:55  MM CLIP PLACEMENT RIGHT  Result Date: 09/30/2022 CLINICAL DATA:  Status post ultrasound-guided biopsy for a RIGHT breast mass. EXAM: 3D DIAGNOSTIC RIGHT MAMMOGRAM POST ULTRASOUND BIOPSY COMPARISON:  Previous exam(s). FINDINGS: 3D Mammographic images were obtained following ultrasound guided biopsy of the RIGHT breast mass at the 10 o'clock axis. The biopsy marking clip is in expected position at the site of biopsy. IMPRESSION: Appropriate positioning of the coil shaped biopsy marking clip at the site of biopsy in the upper-outer quadrant of the RIGHT breast corresponding to the targeted mass at the 10 o'clock axis. Final Assessment: Post Procedure Mammograms for Marker Placement Electronically Signed   By: Franki Cabot M.D.   On: 09/30/2022 15:02  MM DIAG BREAST TOMO BILATERAL  Result Date: 09/26/2022 CLINICAL DATA:  52 year old female with palpable RIGHT breast mass identified on self-examination, and for annual bilateral mammogram. EXAM: DIGITAL DIAGNOSTIC BILATERAL MAMMOGRAM WITH TOMOSYNTHESIS; ULTRASOUND RIGHT BREAST LIMITED TECHNIQUE: Bilateral digital diagnostic mammography and breast tomosynthesis was performed.; Targeted ultrasound examination of the right breast was performed COMPARISON:  Previous exam(s). ACR Breast Density Category b: There are scattered areas of fibroglandular density. FINDINGS: Full field views of both breasts and spot compression view of the RIGHT breast demonstrate a circumscribed oval mass within the  posterior UPPER-OUTER RIGHT breast. No suspicious mammographic findings are noted within the LEFT breast. On physical exam, a firm palpable mass identified at the 10 o'clock position of the RIGHT breast 5 cm from the nipple. Targeted ultrasound is performed, showing a 2.9 x 2.6 x 3 cm slightly irregular hypoechoic mass with internal vascular flow at the 10 o'clock position of the RIGHT breast 5 cm from the nipple. An abnormal RIGHT axillary lymph node with cortical thickening of 8 mm is noted. IMPRESSION: 1. Suspicious 3 cm UPPER-OUTER RIGHT breast mass and an abnormal RIGHT axillary lymph node with cortical thickening. Tissue sampling of the RIGHT breast mass and RIGHT axillary lymph node are recommended. 2. No evidence of LEFT breast malignancy. RECOMMENDATION: Ultrasound-guided biopsies of the UPPER-OUTER RIGHT breast mass and the abnormal RIGHT axillary lymph node,  which will be scheduled. I have discussed the findings and recommendations with the patient. If applicable, a reminder letter will be sent to the patient regarding the next appointment. BI-RADS CATEGORY  4: Suspicious. Electronically Signed   By: Margarette Canada M.D.   On: 09/26/2022 11:50  US BREAST LTD UNI RIGHT INC AXILLA  Result Date: 09/26/2022 CLINICAL DATA:  52 year old female with palpable RIGHT breast mass identified on self-examination, and for annual bilateral mammogram. EXAM: DIGITAL DIAGNOSTIC BILATERAL MAMMOGRAM WITH TOMOSYNTHESIS; ULTRASOUND RIGHT BREAST LIMITED TECHNIQUE: Bilateral digital diagnostic mammography and breast tomosynthesis was performed.; Targeted ultrasound examination of the right breast was performed COMPARISON:  Previous exam(s). ACR Breast Density Category b: There are scattered areas of fibroglandular density. FINDINGS: Full field views of both breasts and spot compression view of the RIGHT breast demonstrate a circumscribed oval mass within the posterior UPPER-OUTER RIGHT breast. No suspicious mammographic findings  are noted within the LEFT breast. On physical exam, a firm palpable mass identified at the 10 o'clock position of the RIGHT breast 5 cm from the nipple. Targeted ultrasound is performed, showing a 2.9 x 2.6 x 3 cm slightly irregular hypoechoic mass with internal vascular flow at the 10 o'clock position of the RIGHT breast 5 cm from the nipple. An abnormal RIGHT axillary lymph node with cortical thickening of 8 mm is noted. IMPRESSION: 1. Suspicious 3 cm UPPER-OUTER RIGHT breast mass and an abnormal RIGHT axillary lymph node with cortical thickening. Tissue sampling of the RIGHT breast mass and RIGHT axillary lymph node are recommended. 2. No evidence of LEFT breast malignancy. RECOMMENDATION: Ultrasound-guided biopsies of the UPPER-OUTER RIGHT breast mass and the abnormal RIGHT axillary lymph node, which will be scheduled. I have discussed the findings and recommendations with the patient. If applicable, a reminder letter will be sent to the patient regarding the next appointment. BI-RADS CATEGORY  4: Suspicious. Electronically Signed   By: Margarette Canada M.D.   On: 09/26/2022 11:50      IMPRESSION/PLAN: 1. 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 Dr. Lisbeth Renshaw discusses the pathology findings and reviews the nature of triple negative breast disease. The consensus from the breast conference includes neoadjuvant chemotherapy followed by breast conservation with lumpectomy with  sentinel node biopsy. Dr. Lisbeth Renshaw would recommend external radiotherapy to the breast  to reduce risks of local recurrence followed by antiestrogen therapy if her tumor is still showing weak positivity. We discussed the risks, benefits, short, and long term effects of radiotherapy, as well as the curative intent, and the patient is interested in proceeding. Dr. Lisbeth Renshaw discusses the delivery and logistics of radiotherapy and anticipates a course of 4 or up to 6 1/2 weeks of radiotherapy to  the right breast. We will see her back a few weeks after surgery to discuss the simulation process and anticipate we starting radiotherapy about 4-6 weeks after surgery.  2. Possible genetic predisposition to malignancy. The patient is a candidate for genetic testing given her personal and family history. She will meet with our geneticist today in clinic. 3. Keloid scar. The patient has a single keloid in the middle of her chest. She has had it removed previously though it grew back. She is interested in possibly having this removed in the future and is open to possible radiation as well. We will revisit this closer to the time of her surgery, and we will see with Dr. Ninfa Linden if he would consider resection of this or if not  we can refer her to plastic surgery when we see her at her next visit.    In a visit lasting 60 minutes, greater than 50% of the time was spent face to face reviewing her case, as well as in preparation of, discussing, and coordinating the patient's care.  The above documentation reflects my direct findings during this shared patient visit. Please see the separate note by Dr. Lisbeth Renshaw on this date for the remainder of the patient's plan of care.    Carola Rhine, [facility]    **Disclaimer: This note was dictated with voice recognition software. Similar sounding words can inadvertently be transcribed and this note may contain transcription errors which may not have been corrected upon publication of note.**

## 2022-10-08 ENCOUNTER — Encounter: Payer: Self-pay | Admitting: *Deleted

## 2022-10-08 ENCOUNTER — Other Ambulatory Visit: Payer: Self-pay

## 2022-10-08 ENCOUNTER — Encounter: Payer: Self-pay | Admitting: Hematology and Oncology

## 2022-10-08 ENCOUNTER — Inpatient Hospital Stay: Payer: Medicare HMO | Admitting: Licensed Clinical Social Worker

## 2022-10-08 ENCOUNTER — Ambulatory Visit: Payer: Medicare HMO | Attending: Surgery | Admitting: Physical Therapy

## 2022-10-08 ENCOUNTER — Encounter: Payer: Self-pay | Admitting: Physical Therapy

## 2022-10-08 ENCOUNTER — Inpatient Hospital Stay: Payer: Medicare HMO | Attending: Physician Assistant | Admitting: Hematology and Oncology

## 2022-10-08 ENCOUNTER — Inpatient Hospital Stay: Payer: Medicare HMO

## 2022-10-08 ENCOUNTER — Inpatient Hospital Stay (HOSPITAL_BASED_OUTPATIENT_CLINIC_OR_DEPARTMENT_OTHER): Payer: Medicare HMO | Admitting: Genetic Counselor

## 2022-10-08 ENCOUNTER — Other Ambulatory Visit: Payer: Self-pay | Admitting: Surgery

## 2022-10-08 ENCOUNTER — Ambulatory Visit
Admission: RE | Admit: 2022-10-08 | Discharge: 2022-10-08 | Disposition: A | Discharge status: Home / Self Care | Payer: Medicare HMO | Source: Ambulatory Visit | Attending: Radiation Oncology | Admitting: Radiation Oncology

## 2022-10-08 VITALS — BP 144/74 | HR 64 | Temp 98.2°F | Resp 16 | Ht 61.0 in | Wt 157.4 lb

## 2022-10-08 DIAGNOSIS — C50411 Malignant neoplasm of upper-outer quadrant of right female breast: Secondary | ICD-10-CM

## 2022-10-08 DIAGNOSIS — D696 Thrombocytopenia, unspecified: Secondary | ICD-10-CM | POA: Diagnosis not present

## 2022-10-08 DIAGNOSIS — Z170 Estrogen receptor positive status [ER+]: Secondary | ICD-10-CM | POA: Insufficient documentation

## 2022-10-08 DIAGNOSIS — Z5111 Encounter for antineoplastic chemotherapy: Secondary | ICD-10-CM | POA: Diagnosis not present

## 2022-10-08 DIAGNOSIS — Z853 Personal history of malignant neoplasm of breast: Secondary | ICD-10-CM | POA: Diagnosis not present

## 2022-10-08 DIAGNOSIS — R293 Abnormal posture: Secondary | ICD-10-CM | POA: Insufficient documentation

## 2022-10-08 DIAGNOSIS — Z79899 Other long term (current) drug therapy: Secondary | ICD-10-CM | POA: Insufficient documentation

## 2022-10-08 DIAGNOSIS — Z803 Family history of malignant neoplasm of breast: Secondary | ICD-10-CM

## 2022-10-08 DIAGNOSIS — C50911 Malignant neoplasm of unspecified site of right female breast: Secondary | ICD-10-CM | POA: Diagnosis not present

## 2022-10-08 LAB — CBC WITH DIFFERENTIAL (CANCER CENTER ONLY)
Abs Immature Granulocytes: 0 10*3/uL (ref 0.00–0.07)
Basophils Absolute: 0 10*3/uL (ref 0.0–0.1)
Basophils Relative: 1 %
Eosinophils Absolute: 0.1 10*3/uL (ref 0.0–0.5)
Eosinophils Relative: 2 %
HCT: 37.9 % (ref 36.0–46.0)
Hemoglobin: 12.4 g/dL (ref 12.0–15.0)
Immature Granulocytes: 0 %
Lymphocytes Relative: 24 %
Lymphs Abs: 0.7 10*3/uL (ref 0.7–4.0)
MCH: 28.2 pg (ref 26.0–34.0)
MCHC: 32.7 g/dL (ref 30.0–36.0)
MCV: 86.3 fL (ref 80.0–100.0)
Monocytes Absolute: 0.2 10*3/uL (ref 0.1–1.0)
Monocytes Relative: 9 %
Neutro Abs: 1.8 10*3/uL (ref 1.7–7.7)
Neutrophils Relative %: 64 %
Platelet Count: 155 10*3/uL (ref 150–400)
RBC: 4.39 MIL/uL (ref 3.87–5.11)
RDW: 12.1 % (ref 11.5–15.5)
WBC Count: 2.8 10*3/uL — ABNORMAL LOW (ref 4.0–10.5)
nRBC: 0 % (ref 0.0–0.2)

## 2022-10-08 LAB — GENETIC SCREENING ORDER

## 2022-10-08 LAB — CMP (CANCER CENTER ONLY)
ALT: 11 U/L (ref 0–44)
AST: 17 U/L (ref 15–41)
Albumin: 4.2 g/dL (ref 3.5–5.0)
Alkaline Phosphatase: 62 U/L (ref 38–126)
Anion gap: 5 (ref 5–15)
BUN: 15 mg/dL (ref 6–20)
CO2: 29 mmol/L (ref 22–32)
Calcium: 9.8 mg/dL (ref 8.9–10.3)
Chloride: 105 mmol/L (ref 98–111)
Creatinine: 0.78 mg/dL (ref 0.44–1.00)
GFR, Estimated: >60 mL/min (ref >60)
Glucose, Bld: 92 mg/dL (ref 70–99)
Potassium: 3.5 mmol/L (ref 3.5–5.1)
Sodium: 139 mmol/L (ref 135–145)
Total Bilirubin: 0.5 mg/dL (ref 0.3–1.2)
Total Protein: 7.4 g/dL (ref 6.5–8.1)

## 2022-10-08 NOTE — Progress Notes (Unsigned)
[facility] NOTE  Patient Care Team: [name], NP-C as PCP - General (Family Medicine) Coralie Keens, MD as Consulting Physician (General Surgery) Benay Pike, MD as Consulting Physician (Hematology and Oncology) Kyung Rudd, MD as Consulting Physician (Radiation Oncology) Mauro Kaufmann, RN as Oncology Nurse Navigator Rockwell Germany, RN as Oncology Nurse Navigator  CHIEF COMPLAINTS/PURPOSE OF CONSULTATION:  Newly diagnosed breast cancer  HISTORY OF PRESENTING ILLNESS:  Maria Carter 52 y.o. female is here because of recent diagnosis of right   I reviewed her records extensively and collaborated the history with the patient.  SUMMARY OF ONCOLOGIC HISTORY: Oncology History  Malignant neoplasm of upper-outer quadrant of right female breast ([place])  09/26/2022 Mammogram   Patient had a palpable right breast mass and hence underwent bilateral diagnostic mammogram.  This confirmed a suspicious 3 cm right upper outer quadrant mass and an abnormal right axillary lymph node with cortical thickening.  Tissue sampling of both the breast mass and axillary lymph node were recommended.  Left breast was normal.   09/26/2022 Breast US   Ultrasound breast confirmed the above-mentioned findings.   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  Prognostic showed ER 50% positive weak staining PR 0% negative HER2 1+ by IHC and Ki-67 of 70%   10/06/2022 Initial Diagnosis   Malignant neoplasm of upper-outer quadrant of right female breast ([place])   10/08/2022 Cancer Staging   Staging form: Breast, AJCC 8th Edition - Clinical: Stage IIB (cT2, cN0, cM0, G3, ER+, PR-, HER2-) - Signed by Benay Pike, MD on 10/08/2022 Stage prefix: Initial diagnosis Histologic grading system: 3 grade system   10/16/2022 -  Chemotherapy   Patient is on Treatment Plan : BREAST Pembrolizumab (200) D1 + Carboplatin (5) D1 +  Paclitaxel (80) D1,8,15 q21d X 4 cycles / Pembrolizumab (200) D1 + AC D1 q21d x 4 cycles      Ms. Berens today arrived with her partner for the breast [place] discussion.  Besides this palpable lump and family history significant for sister with breast cancer died at 75, she denies any major medical issues.  She takes medication for depression.  She is otherwise healthy.  She is currently not working.  She has 4 kids.  She denies any prolonged use of hormone replacement therapy or oral contraceptives.  Besides the breast mass, she denies any other new symptoms.  Rest of the pertinent 10 point ROS reviewed and negative  MEDICAL HISTORY:  Past Medical History:  Diagnosis Date   Anxiety    Breast cancer ([place])    Depression     SURGICAL HISTORY: Past Surgical History:  Procedure Laterality Date   BREAST BIOPSY Right 09/30/2022   [place] RT BREAST BX W LOC DEV 1ST LESION IMG BX SPEC US GUIDE 09/30/2022 [facility] MAMMOGRAPHY    SOCIAL HISTORY: Social History   Socioeconomic History   Marital status: Single    Spouse name: Not on file   Number of children: Not on file   Years of education: Not on file   Highest education level: Not on file  Occupational History   Not on file  Tobacco Use   Smoking status: Never   Smokeless tobacco: Never  Vaping Use   Vaping Use: Never used  Substance and Sexual Activity   Alcohol use: Yes    Comment: social   Drug use: No   Sexual activity: Not Currently    Partners: Male  Other Topics Concern   Not on file  Social History Narrative   Not on file   Social Determinants of Health   Financial Resource Strain: High Risk (10/08/2022)   Overall Financial Resource Strain (CARDIA)    Difficulty of Paying Living Expenses: Hard  Food Insecurity: Food Insecurity Present (10/08/2022)   Hunger Vital Sign    Worried About Running Out of Food in the Last Year: Sometimes true    Ran Out of Food in the Last Year: Sometimes true  Transportation Needs: No Transportation  Needs (10/08/2022)   PRAPARE - Hydrologist (Medical): No    Lack of Transportation (Non-Medical): No  Physical Activity: Not on file  Stress: Not on file  Social Connections: Not on file  Intimate Partner Violence: Not on file    FAMILY HISTORY: Family History  Problem Relation Age of Onset   Breast cancer Sister 40    ALLERGIES:  has No Known Allergies.  MEDICATIONS:  Current Outpatient Medications  Medication Sig Dispense Refill   ARIPiprazole (ABILIFY) 2 MG tablet Take 1/2 tablet daily for one week then take 1 tablet daily     busPIRone (BUSPAR) 15 MG tablet Take by mouth.     cholecalciferol (VITAMIN D3) 25 MCG (1000 UNIT) tablet Take 1,000 Units by mouth daily.     desvenlafaxine (PRISTIQ) 100 MG 24 hr tablet Take 100 mg by mouth daily.     dexamethasone (DECADRON) 4 MG tablet Take 2 tablets once a day for 3 days after chemotherapy. Take with food. 30 tablet 1   lidocaine-prilocaine (EMLA) cream Apply to affected area once 30 g 3   Multiple Vitamins-Minerals (MULTIVITAMIN ADULTS PO) Take 1 tablet by mouth daily.     ondansetron (ZOFRAN) 8 MG tablet Take 1 tablet (8 mg total) by mouth every 8 (eight) hours as needed for nausea or vomiting. Start on the third day after chemotherapy. 30 tablet 1   prochlorperazine (COMPAZINE) 10 MG tablet Take 1 tablet (10 mg total) by mouth every 6 (six) hours as needed for nausea or vomiting. 30 tablet 1   vitamin B-12 (CYANOCOBALAMIN) 100 MCG tablet Take 100 mcg by mouth daily.     No current facility-administered medications for this visit.    REVIEW OF SYSTEMS:   Constitutional: Denies fevers, chills or abnormal night sweats Eyes: Denies blurriness of vision, double vision or watery eyes Ears, nose, mouth, throat, and face: Denies mucositis or sore throat Respiratory: Denies cough, dyspnea or wheezes Cardiovascular: Denies palpitation, chest discomfort or lower extremity swelling Gastrointestinal:  Denies  nausea, heartburn or change in bowel habits Skin: Denies abnormal skin rashes Lymphatics: Denies new lymphadenopathy or easy bruising Neurological:Denies numbness, tingling or new weaknesses Behavioral/Psych: Mood is stable, no new changes  Breast: Denies any palpable lumps or discharge All other systems were reviewed with the patient and are negative.  PHYSICAL EXAMINATION: ECOG PERFORMANCE STATUS: 0 - Asymptomatic  Vitals:   10/08/22 0853  BP: (!) 144/74  Pulse: 64  Resp: 16  Temp: 98.2 F (36.8 C)  SpO2: 100%   Filed Weights   10/08/22 0853  Weight: 157 lb 6.4 oz (71.4 kg)    Physical Exam Constitutional:      Appearance: Normal appearance.  Cardiovascular:     Rate and Rhythm: Normal rate and regular rhythm.  Chest:       Comments: Large breast mass in the right breast upper outer quadrant measuring about 4 cms in largest dimension. No palpable  right axillary LN Musculoskeletal:     Cervical back: No rigidity.  Lymphadenopathy:     Cervical: No cervical adenopathy.  Neurological:     General: No focal deficit present.     Mental Status: She is alert.  Psychiatric:        Mood and Affect: Mood normal.      LABORATORY DATA:  I have reviewed the data as listed Lab Results  Component Value Date   WBC 2.8 (L) 10/08/2022   HGB 12.4 10/08/2022   HCT 37.9 10/08/2022   MCV 86.3 10/08/2022   PLT 155 10/08/2022   Lab Results  Component Value Date   NA 139 10/08/2022   K 3.5 10/08/2022   CL 105 10/08/2022   CO2 29 10/08/2022    RADIOGRAPHIC STUDIES: I have personally reviewed the radiological reports and agreed with the findings in the report.  ASSESSMENT AND PLAN:  Malignant neoplasm of upper-outer quadrant of right female breast [facility]) This is a very pleasant 52 year old female patient with no significant past medical history, significant family history of breast cancer in sister who died at the age of 82 from metastatic breast cancer referred to breast  [place] with a new palpable lump biopsy-proven invasive carcinoma, high-grade, ER staining, PR negative and HER2 negative with a high proliferation index.  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  We have discussed about considering keynote 522 which consists of combination chemotherapy/immunotherapy.  Although lymph node was thought to be suspicious on the initial imaging, this was not seen on the ultrasound to consider biopsy hence it was not biopsied.  I have discussed about the regimen, adverse effects including but not limited to fatigue, nausea, vomiting, diarrhea, increased risk of infections, cardiotoxicity, neuropathy and immunotherapy related adverse effects.  She understands that some of the side effects can be life-threatening and permanent.  However I do believe she will have excellent response with neoadjuvant therapy. She is agreeable to proceeding with neoadjuvant therapy. She will be getting MRI pre chemo, chemo class and port placement. There is more than 50% likelihood that she will achieve complete pathologic response.  Postsurgery, she will proceed with adjuvant radiation followed by attempt to try antiestrogen therapy given the weak ER staining.  She is premenopausal, hence tamoxifen will be her choice for antiestrogen therapy.  All her questions were answered to the best my knowledge.  She should also consider genetic testing given her strong family history.  Thank you for consulting [place] in the care of this patient.  Please do not hesitate to contact us with any additional questions or concerns.  Total time spent: 60 minutes including history, physical exam, review of records, counseling and coordination of care All questions were answered. The patient knows to call the clinic with any problems, questions or concerns.    Benay Pike, MD 10/09/22

## 2022-10-08 NOTE — Research (Signed)
[employer] 2021-05 - Specimen Collection Study to Evaluate Biomarkers in Subjects with Cancer    Patient Maria Carter was identified by Dr. Chryl Heck as a potential candidate for the above listed study.  This Clinical Research Nurse met with Maria Carter, [id number], on 10/08/22 in a manner and location that ensures patient privacy to discuss participation in the above listed research study.  Patient is Unaccompanied.  A copy of the informed consent document with embedded HIPAA language was provided to the patient.  Patient reads, speaks, and understands [place].   Patient was provided with the business card of this Nurse and encouraged to contact the research team with any questions.  Approximately 20 minutes was spent with the patient reviewing the informed consent documents.  Patient was provided the option of taking informed consent documents home to review and was encouraged to review at their convenience with their support network, including other care providers. Patient took the consent documents home to review.  The research nurse will call the pt next week to answer her questions about the study and to discuss her participation.  The pt was told that her participation in this specimen study is completely optional.  The pt was informed that if she does participate then she would not receive any information about her submitted specimens.  Her blood/tissue would be used for research purposes only.  The pt was told that if she participates and provides a successful blood collection then she would receive a $50 gift card.  The pt verbalized understanding. Brion Aliment RN, BSN, CCRP Clinical Research Nurse Lead 10/08/2022 12:14 PM

## 2022-10-08 NOTE — Research (Signed)
S2205, ICE COMPRESS: RANDOMIZED TRIAL OF LIMB CRYOCOMPRESSION VERSUS CONTINUOUS COMPRESSION VERSUS LOW CYCLIC COMPRESSION FOR THE PREVENTION  OF TAXANE-INDUCED PERIPHERAL NEUROPATHY   Patient Maria Carter was identified by Dr. Chryl Heck as a potential candidate for the above listed study.  This Clinical Research Nurse met with [name], [id number], on 10/08/22 in a manner and location that ensures patient privacy to discuss participation in the above listed research study.  Patient is Unaccompanied.  A copy of the informed consent document and separate HIPAA Authorization was provided to the patient.  Patient reads, speaks, and understands [place].   Patient was provided with the business card of this Nurse and encouraged to contact the research team with any questions.  Approximately 15 minutes was spent with the patient reviewing the informed consent documents.  Patient was provided the option of taking informed consent documents home to review and was encouraged to review at their convenience with their support network, including other care providers. Patient took the consent documents home to review.  This nurse will follow up with the pt next week to see if she has any questions/concerns about this study.   Brion Aliment RN, BSN, CCRP Clinical Research Nurse Lead 10/08/2022 12:22 PM

## 2022-10-08 NOTE — Progress Notes (Unsigned)
REFERRING PROVIDER: Benay Pike, MD  PRIMARY PROVIDER:  [name], NP-C  PRIMARY REASON FOR VISIT:  Encounter Diagnoses  Name Primary?   Malignant neoplasm of upper-outer quadrant of right female breast, unspecified estrogen receptor status ([place]) Yes   Family history of breast cancer     HISTORY OF PRESENT ILLNESS:   Maria Carter, a 52 y.o. female, was seen for a [place] cancer genetics consultation during the breast multidisciplinary clinic at the request of Dr. Chryl Heck due to a personal and family history of cancer.  Maria Carter presents to clinic today to discuss the possibility of a hereditary predisposition to cancer, to discuss genetic testing, and to further clarify her future cancer risks, as well as potential cancer risks for family members.   CANCER HISTORY:  In February 2024, at age 30, Maria Carter was diagnosed with invasive ductal carcinoma of the right breast (ER positive, PR negative, HER2 negative).  RISK FACTORS:  Menarche was at age 87.  First live birth at age 14.  OCP use for approximately 0 years.  Ovaries intact: yes.  Uterus intact: yes.  HRT use: 0 years. Colonoscopy: yes;  two years ago . Mammogram within the last year: yes. Any excessive radiation exposure in the past: no  Past Medical History:  Diagnosis Date   Anxiety    Depression     Past Surgical History:  Procedure Laterality Date   BREAST BIOPSY Right 09/30/2022   [place] RT BREAST BX W LOC DEV 1ST LESION IMG BX [place] US GUIDE 09/30/2022 [facility] MAMMOGRAP[facility]    Social History   Socioeconomic History   Marital status: Single    Spouse name: Not on file   Number of children: Not on file   Years of education: Not on file   Highest education level: Not on file  Occupational History   Not on file  Tobacco Use   Smoking status: Never   Smokeless tobacco: Never  Vaping Use   Vaping Use: Never used  Substance and Sexual Activity   Alcohol use: Yes    Comment: social   Drug use: No    Sexual activity: Not Currently    Partners: Male  Other Topics Concern   Not on file  Social History Narrative   Not on file   Social Determinants of Health   Financial Resource Strain: Not on file  Food Insecurity: Not on file  Transportation Needs: Not on file  Physical Activity: Not on file  Stress: Not on file  Social Connections: Not on file     FAMILY HISTORY:  We obtained a detailed, 4-generation family history.  Significant diagnoses are listed below: Family History  Problem Relation Age of Onset   Breast cancer Sister 22 - 75   Cancer Paternal Grandmother        unknown type     Maria Carter's paternal half sister was diagnosed with breast cancer in her 38s and died in her 64s due to metastatic breast cancer. Her paternal grandmother was diagnosed with an unknown type of cancer at an unknown age, she is deceased. Maria Carter is unaware of previous family history of genetic testing for hereditary cancer risks. There is no reported Ashkenazi Jewish ancestry.  GENETIC COUNSELING ASSESSMENT: Maria Carter is a 52 y.o. female with a personal and family history of cancer which is somewhat suggestive of a hereditary cancer syndrome and predisposition to cancer given her young age at diagnosis. We, therefore, discussed and recommended the following at today's  visit.   DISCUSSION: We discussed that 5 - 10% of cancer is hereditary, with most cases of hereditary breast cancer associated with mutations in BRCA1/2.  There are other genes that can be associated with hereditary breast cancer syndromes. Type of cancer risk and level of risk are gene-specific. We discussed that testing is beneficial for several reasons including knowing how to follow individuals after completing their treatment, identifying whether potential treatment options would be beneficial, and understanding if other family members could be at risk for cancer and allowing them to undergo genetic testing.   We reviewed the  characteristics, features and inheritance patterns of hereditary cancer syndromes. We also discussed genetic testing, including the appropriate family members to test, the process of testing, insurance coverage and turn-around-time for results. We discussed the implications of a negative, positive and/or variant of uncertain significant result. In order to get genetic test results in a timely manner so that Maria Carter can use these genetic test results for surgical decisions, we recommended Maria Carter pursue genetic testing for the Breast Cancer STAT Panel. Once complete, we recommend Maria Carter pursue reflex genetic testing to a more comprehensive gene panel.   Maria Carter elected to have [facility] Common Cancer Panel. The Common Hereditary Cancers Panel offered by [facility] includes sequencing and/or deletion duplication testing of the following 48 genes: APC, ATM, AXIN2, BAP1, BARD1, BMPR1A, BRCA1, BRCA2, BRIP1, CDH1, CDK4, CDKN2A (p14ARF and p16INK4a only), CHEK2, CTNNA1, DICER1, EPCAM (Deletion/duplication testing only), FH, GREM1 (promoter region duplication testing only), HOXB13, KIT, MBD4, MEN1, MLH1, MSH2, MSH3, MSH6, MUTYH, NF1, NHTL1, PALB2, PDGFRA, PMS2, POLD1, POLE, PTEN, RAD51C, RAD51D, SDHA (sequencing analysis only except exon 14), SDHB, SDHC, SDHD, SMAD4, SMARCA4. STK11, TP53, TSC1, TSC2, and VHL.  Based on Maria Carter's personal and family history of cancer, she meets medical criteria for genetic testing. Despite that she meets criteria, she may still have an out of pocket cost. We discussed that if her out of pocket cost for testing is over $100, the laboratory should contact them to discuss self-pay prices, patient pay assistance programs, if applicable, and other billing options.   PLAN: After considering the risks, benefits, and limitations, Maria Carter provided informed consent to pursue genetic testing and the blood sample was sent to [facility] for analysis of the Common Cancer  Panel. Results should be available within approximately 1-2 weeks' time, at which point they will be disclosed by telephone to Maria Carter, as will any additional recommendations warranted by these results. Maria Carter will receive a summary of her genetic counseling visit and a copy of her results once available. This information will also be available in Epic.   Maria Carter questions were answered to her satisfaction today. Our contact information was provided should additional questions or concerns arise. Thank you for the referral and allowing [place] to share in the care of your patient.   Lucille Passy, MS, [facility] Genetic Counselor [email or web address] (P) [phone]  The patient was seen for a total of 20 minutes in face-to-face genetic counseling.  The patient brought her partner. Drs. Lindi Adie and/or Burr Medico were available to discuss this case as needed.  _______________________________________________________________________ For Office Staff:  Number of people involved in session: 2 Was an Intern/ student involved with case: no

## 2022-10-08 NOTE — Progress Notes (Signed)
[place] Work  Initial Assessment   Maria Carter "Maria Carter" is a 52 y.o. year old female presenting alone (partner was present earlier). Clinical Social Work was referred by  [facility]  for assessment of psychosocial needs.   SDOH (Social Determinants of Health) assessments performed: Yes SDOH Interventions    Flowsheet Row Clinical Support from 10/08/2022 in [place] at [place] Interventions Other (Comment)  [food pantry list, bag from [facility] food pantry]  Housing Interventions Intervention Not Indicated  Transportation Interventions Intervention Not Indicated  Utilities Interventions Other (Comment)  [past-due on electric. [name] info]  Financial Strain Interventions Other (Comment)  [food pantries, DSS, [employer], cancer foundations, bag from food pantry]       [place]: Food Insecurity Present (10/08/2022)  Housing: Low Risk  (10/08/2022)  Transportation Needs: No Transportation Needs (10/08/2022)  Utilities: At Risk (10/08/2022)  Depression (PHQ2-9): High Risk (02/28/2022)  Financial Resource Strain: High Risk (10/08/2022)  Tobacco Use: Low Risk  (10/08/2022)     Distress Screen completed: Yes    10/08/2022   11:52 AM  ONCBCN DISTRESS SCREENING  Screening Type Initial Screening  Distress experienced in past week (1-10) 8  Practical problem type Food  Emotional problem type Depression;Isolation/feeling alone;Feeling hopeless  Information Concerns Type Lack of info about diagnosis;Lack of info about treatment;Lack of info about complementary therapy choices;Lack of info about maintaining fitness  Physical Problem type Sleep/insomnia;Loss of appetitie      Family/Social Information:  Housing Arrangement: patient lives with 2 youngest kids (sons ages 44 & 65), partner Maria Carter) , 2 dogs. 2 older kids (67 & 59) live outside the home Family members/support persons in  your life? Family and psychiatry Transportation concerns: no  Employment: Legally disabled for anxiety/depression.  Income source: Banker concerns: Yes, current concerns Type of concern: Animal nutritionist access concerns: yes, costs Religious or spiritual practice: Not known Services Currently in place:  Teacher, music, social security disability, [employer] M[employer]  Coping/ Adjustment to diagnosis: Patient understands treatment plan and what happens next? yes, feels less anxious after meeting with medical team Concerns about diagnosis and/or treatment:  general cancer adjustment fears, how to talk with her kids about cancer Patient reported stressors: Food, Depression, Anxiety/ nervousness, Isolation/ feeling alone, and Feeling hopeless Patient enjoys fishing, being outside, time with family/ friends, and time with her puppies Current coping skills/ strengths: Ability for insight , Motivation for treatment/growth , and Special hobby/interest     SUMMARY: Current SDOH Barriers:  Financial constraints, especially with food and utilities Mental health concerns  Clinical Social Work Clinical Goal(s):  Explore community resource options for unmet needs related to:  Electrical engineer with counselor for ongoing therapy  Interventions: Discussed common feeling and emotions when being diagnosed with cancer, and the importance of support during treatment Informed patient of the support team roles and support services at [facility] Provided CSW contact information and encouraged patient to call with any questions or concerns Provided patient with information about food pantries, local utility resources, cancer foundations, and therapists  Provided bag from [employer] today Referred for peer mentor/ [place] guide   Follow Up Plan: Patient will contact CSW with any support or resource needs and CSW will follow pt during treatment Patient  verbalizes understanding of plan: Yes    Maria Carter E Maria Heater, LCSW

## 2022-10-08 NOTE — Therapy (Signed)
OUTPATIENT PHYSICAL THERAPY BREAST CANCER BASELINE EVALUATION   Patient Name: Maria Carter MRN: [id number] DOB:1971/03/07, 52 y.o., female Today's Date: 10/08/2022  END OF SESSION:  PT End of Session - 10/08/22 0936     Visit Number 1    Number of Visits 2    Date for PT Re-Evaluation 04/08/23    PT Start Time 0937    PT Stop Time 1008    PT Time Calculation (min) 31 min    Activity Tolerance Patient tolerated treatment well    Behavior During Therapy [facility] for tasks assessed/performed             Past Medical History:  Diagnosis Date   Anxiety    Breast cancer ([place])    Depression    Past Surgical History:  Procedure Laterality Date   BREAST BIOPSY Right 09/30/2022   [place] RT BREAST BX W LOC DEV 1ST LESION IMG BX [place] US GUIDE 09/30/2022 [facility] MAMMOGRAPHY   Patient Active Problem List   Diagnosis Date Noted   Malignant neoplasm of upper-outer quadrant of right female breast ([place]) 10/06/2022   Thrombocytopenia ([place]) 03/05/2022   Leukopenia 03/05/2022   Hot flashes 02/28/2022   Hair thinning 02/28/2022   Amenorrhea 02/28/2022   Screening for colon cancer 02/28/2022   Multiple nevi 02/28/2022   PTSD (post-traumatic stress disorder) 02/08/2021   Mild episode of recurrent major depressive disorder ([place]) 09/12/2013   Attention deficit disorder 04/25/2013   Migraine 02/05/2013   KELOID SCAR 05/09/2010   URINALYSIS, ABNORMAL 05/09/2010   ANXIETY 03/01/2010   DEPRESSION 03/01/2010   HEADACHE 03/01/2010   Intermittent chest pain 03/01/2010    REFERRING PROVIDER: Dr. Coralie Keens  REFERRING DIAG: Right breast cancer  THERAPY DIAG:  Malignant neoplasm of upper-outer quadrant of right breast in female, estrogen receptor positive ([place])  Abnormal posture  Rationale for Evaluation and Treatment: Rehabilitation  ONSET DATE: 09/26/2022  SUBJECTIVE:                                                                                                                                                                                            SUBJECTIVE STATEMENT: Patient reports she is here today to be seen by her medical team for her newly diagnosed right breast cancer.   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 It measures 3 cm and is located in the upper outer quadrant. It is weakly ER positive, PR and HER2 negative with a Ki67 of 70%. She is on permanent disability for mental health reasons.  PATIENT GOALS:   reduce lymphedema  risk and learn post op HEP.   PAIN:  Are you having pain? No  PRECAUTIONS: Active CA   HAND DOMINANCE: right  WEIGHT BEARING RESTRICTIONS: No  FALLS:  Has patient fallen in last 6 months? No  LIVING ENVIRONMENT: Patient lives with: her female partner and her 2 sons ages 26 and 69 Lives in: House/apartment Has following equipment at home: None  OCCUPATION: On Disability  LEISURE: She walks her dogs but does not regularly exercise  PRIOR LEVEL OF FUNCTION: Independent   OBJECTIVE:  COGNITION: Overall cognitive status: Within functional limits for tasks assessed    POSTURE:  Forward head and rounded shoulders posture  UPPER EXTREMITY AROM/PROM:  A/PROM RIGHT   eval   Shoulder extension 40  Shoulder flexion 156  Shoulder abduction 155  Shoulder internal rotation 47  Shoulder external rotation 87    (Blank rows = not tested)  A/PROM LEFT   eval  Shoulder extension 55  Shoulder flexion 153  Shoulder abduction 151  Shoulder internal rotation 62  Shoulder external rotation 72    (Blank rows = not tested)  CERVICAL AROM: All within normal limits  UPPER EXTREMITY STRENGTH: WFL  LYMPHEDEMA ASSESSMENTS:   LANDMARK RIGHT   eval  10 cm proximal to olecranon process 29.5  Olecranon process 24.9  10 cm proximal to ulnar styloid process 24  Just proximal to ulnar styloid process 17  Across hand at thumb web space 19  At  base of 2nd digit 6.6  (Blank rows = not tested)  LANDMARK LEFT   eval  10 cm proximal to olecranon process 30.2  Olecranon process 24.7  10 cm proximal to ulnar styloid process 23  Just proximal to ulnar styloid process 16.5  Across hand at thumb web space 19  At base of 2nd digit 6.4  (Blank rows = not tested)  L-DEX LYMPHEDEMA SCREENING:  The patient was assessed using the L-Dex machine today to produce a lymphedema index baseline score. The patient will be reassessed on a regular basis (typically every 3 months) to obtain new L-Dex scores. If the score is > 6.5 points away from his/her baseline score indicating onset of subclinical lymphedema, it will be recommended to wear a compression garment for 4 weeks, 12 hours per day and then be reassessed. If the score continues to be > 6.5 points from baseline at reassessment, we will initiate lymphedema treatment. Assessing in this manner has a 95% rate of preventing clinically significant lymphedema.   L-DEX FLOWSHEETS - 10/08/22 1000       L-DEX LYMPHEDEMA SCREENING   Measurement Type Unilateral    L-DEX MEASUREMENT EXTREMITY Upper Extremity    POSITION  Standing    DOMINANT SIDE Right    At Risk Side Right    BASELINE SCORE (UNILATERAL) -0.7             QUICK DASH SURVEY:  Katina Dung - 10/08/22 0001     Open a tight or new jar No difficulty    Do heavy household chores (wash walls, wash floors) No difficulty    Carry a shopping bag or briefcase No difficulty    Wash your back No difficulty    Use a knife to cut food No difficulty    Recreational activities in which you take some force or impact through your arm, shoulder, or hand (golf, hammering, tennis) No difficulty    During the past week, to what extent has your arm, shoulder or hand problem interfered with your  normal social activities with family, friends, neighbors, or groups? Not at all    During the past week, to what extent has your arm, shoulder or hand  problem limited your work or other regular daily activities Not at all    Arm, shoulder, or hand pain. None    Tingling (pins and needles) in your arm, shoulder, or hand None    Difficulty Sleeping No difficulty    DASH Score 0 %              PATIENT EDUCATION:  Education details: Lymphedema risk reduction and post op shoulder/posture HEP Person educated: Patient Education method: Explanation, Demonstration, Handout Education comprehension: Patient verbalized understanding and returned demonstration  HOME EXERCISE PROGRAM: Patient was instructed today in a home exercise program today for post op shoulder range of motion. These included active assist shoulder flexion in sitting, scapular retraction, wall walking with shoulder abduction, and hands behind head external rotation.  She was encouraged to do these twice a day, holding 3 seconds and repeating 5 times when permitted by her physician.   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 It measures 3 cm and is located in the upper outer quadrant. It is weakly ER positive, PR and HER2 negative with a Ki67 of 70%. She is on permanent disability for mental health reasons.Her multidisciplinary medical team met prior to her assessments to determine a recommended treatment plan. She is planning to have neoadjuvant chemotherapy followed by a right lumpectomy and sentinel node biopsy, radiation, and anti-estrogen therapy. She will benefit from a post op PT reassessment to determine needs and from L-Dex screens every 3 months for 2 years to detect subclinical lymphedema.  Pt will benefit from skilled therapeutic intervention to improve on the following deficits: Decreased knowledge of precautions, impaired UE functional use, pain, decreased ROM, postural dysfunction.   PT treatment/interventions: ADL/self-care home management, pt/family education,  therapeutic exercise  REHAB POTENTIAL: Excellent  CLINICAL DECISION MAKING: Stable/uncomplicated  EVALUATION COMPLEXITY: Low   GOALS: Goals reviewed with patient? YES  LONG TERM GOALS: (STG=LTG)    Name Target Date Goal status  1 Pt will be able to verbalize understanding of pertinent lymphedema risk reduction practices relevant to her dx specifically related to skin care.  Baseline:  No knowledge 10/08/2022 Achieved at eval  2 Pt will be able to return demo and/or verbalize understanding of the post op HEP related to regaining shoulder ROM. Baseline:  No knowledge 10/08/2022 Achieved at eval  3 Pt will be able to verbalize understanding of the importance of attending the post op After Breast CA Class for further lymphedema risk reduction education and therapeutic exercise.  Baseline:  No knowledge 10/08/2022 Achieved at eval  4 Pt will demo she has regained full shoulder ROM and function post operatively compared to baselines.  Baseline: See objective measurements taken today. 04/08/2023     PLAN:  PT FREQUENCY/DURATION: EVAL and 1 follow up appointment.   PLAN FOR NEXT SESSION: will reassess 3-4 weeks post op to determine needs.   Patient will follow up at outpatient cancer rehab 3-4 weeks following surgery.  If the patient requires physical therapy at that time, a specific plan will be dictated and sent to the referring physician for approval. The patient was educated today on appropriate basic range of motion exercises to begin post operatively and the importance of attending the After Breast Cancer class following surgery.  Patient was  educated today on lymphedema risk reduction practices as it pertains to recommendations that will benefit the patient immediately following surgery.  She verbalized good understanding.    Physical Therapy Information for After Breast Cancer Surgery/Treatment:  Lymphedema is a swelling condition that you may be at risk for in your arm if you have lymph  nodes removed from the armpit area.  After a sentinel node biopsy, the risk is approximately 5-9% and is higher after an axillary node dissection.  There is treatment available for this condition and it is not life-threatening.  Contact your physician or physical therapist with concerns. You may begin the 4 shoulder/posture exercises (see additional sheet) when permitted by your physician (typically a week after surgery).  If you have drains, you may need to wait until those are removed before beginning range of motion exercises.  A general recommendation is to not lift your arms above shoulder height until drains are removed.  These exercises should be done to your tolerance and gently.  This is not a "no pain/no gain" type of recovery so listen to your body and stretch into the range of motion that you can tolerate, stopping if you have pain.  If you are having immediate reconstruction, ask your plastic surgeon about doing exercises as he or she may want you to wait. We encourage you to attend the free one time ABC (After Breast Cancer) class offered by [facility].  You will learn information related to lymphedema risk, prevention and treatment and additional exercises to regain mobility following surgery.  You can call [phone] for more information.  This is offered the 1st and 3rd Monday of each month.  You only attend the class one time. While undergoing any medical procedure or treatment, try to avoid blood pressure being taken or needle sticks from occurring on the arm on the side of cancer.   This recommendation begins after surgery and continues for the rest of your life.  This may help reduce your risk of getting lymphedema (swelling in your arm). An excellent resource for those seeking information on lymphedema is the National Lymphedema Network's web site. It can be accessed at [place].org If you notice swelling in your hand, arm or breast at any time following  surgery (even if it is many years from now), please contact your doctor or physical therapist to discuss this.  Lymphedema can be treated at any time but it is easier for you if it is treated early on.  If you feel like your shoulder motion is not returning to normal in a reasonable amount of time, please contact your surgeon or physical therapist.  [facility] [phone]. [address], [place] [address]  ABC CLASS After Breast Cancer Class  After Breast Cancer Class is a specially designed exercise class to assist you in a safe recover after having breast cancer surgery.  In this class you will learn how to get back to full function whether your drains were just removed or if you had surgery a month ago.  This one-time class is held the 1st and 3rd Monday of every month from 11:00 a.m. until 12:00 noon virtually.  This class is FREE and space is limited. For more information or to register for the next available class, call [phone].  Class Goals  Understand specific stretches to improve the flexibility of you chest and shoulder. Learn ways to safely strengthen your upper body and improve your posture.  Understand the warning signs of infection and why you may be at risk for an arm infection. Learn about Lymphedema and prevention.  ** You do not attend this class until after surgery.  Drains must be removed to participate  Patient was instructed today in a home exercise program today for post op shoulder range of motion. These included active assist shoulder flexion in sitting, scapular retraction, wall walking with shoulder abduction, and hands behind head external rotation.  She was encouraged to do these twice a day, holding 3 seconds and repeating 5 times when permitted by her physician.  Annia Friendly, [place] 10/08/22 10:23 AM

## 2022-10-09 ENCOUNTER — Encounter: Payer: Self-pay | Admitting: Hematology and Oncology

## 2022-10-09 ENCOUNTER — Encounter: Payer: Self-pay | Admitting: Genetic Counselor

## 2022-10-09 MED ORDER — DEXAMETHASONE 4 MG PO TABS: ORAL_TABLET | ORAL | 1 refills | Status: DC

## 2022-10-09 MED ORDER — PROCHLORPERAZINE MALEATE 10 MG PO TABS: 10.0000 mg | ORAL_TABLET | Freq: Four times a day (QID) | ORAL | 1 refills | Status: DC | PRN

## 2022-10-09 MED ORDER — ONDANSETRON HCL 8 MG PO TABS: 8.0000 mg | ORAL_TABLET | Freq: Three times a day (TID) | ORAL | 1 refills | Status: DC | PRN

## 2022-10-09 MED ORDER — LIDOCAINE-PRILOCAINE 2.5-2.5 % EX CREA: TOPICAL_CREAM | CUTANEOUS | 3 refills | Status: DC

## 2022-10-09 NOTE — Progress Notes (Signed)
START ON PATHWAY REGIMEN - Breast     Cycles 1 through 4: A cycle is every 21 days:     Pembrolizumab      Paclitaxel      Carboplatin      Filgrastim-xxxx    Cycles 5 through 8: A cycle is every 21 days:     Pembrolizumab      Doxorubicin      Cyclophosphamide      Pegfilgrastim-xxxx   **Always confirm dose/schedule in your pharmacy ordering system**  Patient Characteristics: Preoperative or Nonsurgical Candidate (Clinical Staging), Neoadjuvant Therapy followed by Surgery, Invasive Disease, Chemotherapy, HER2 Negative, ER Negative, Platinum Therapy Indicated and Candidate for Checkpoint Inhibitor Therapeutic Status: Preoperative or Nonsurgical Candidate (Clinical Staging) AJCC M Category: cM0 AJCC Grade: G3 Breast Surgical Plan: Neoadjuvant Therapy followed by Surgery ER Status: Negative (-) AJCC 8 Stage Grouping: IIB HER2 Status: Negative (-) AJCC T Category: cT2 AJCC N Category: cN0 PR Status: Negative (-) Intent of Therapy: Curative Intent, Discussed with Patient 

## 2022-10-09 NOTE — Assessment & Plan Note (Addendum)
This is a very pleasant 52 year old female patient with no significant past medical history, significant family history of breast cancer in sister who died at the age of 26 from metastatic breast cancer referred to breast [facility] with a new palpable lump biopsy-proven invasive carcinoma, high-grade, ER staining, PR negative and HER2 negative with a high proliferation index.  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  We have discussed about considering keynote 522 which consists of combination chemotherapy/immunotherapy.  Although lymph node was thought to be suspicious on the initial imaging, this was not seen on the ultrasound to consider biopsy hence it was not biopsied.  I have discussed about the regimen, adverse effects including but not limited to fatigue, nausea, vomiting, diarrhea, increased risk of infections, cardiotoxicity, neuropathy and immunotherapy related adverse effects.  She understands that some of the side effects can be life-threatening and permanent.  However I do believe she will have excellent response with neoadjuvant therapy. She is agreeable to proceeding with neoadjuvant therapy. She will be getting MRI pre chemo, chemo class and port placement. There is more than 50% likelihood that she will achieve complete pathologic response.  Postsurgery, she will proceed with adjuvant radiation followed by attempt to try antiestrogen therapy given the weak ER staining.  She is premenopausal, hence tamoxifen will be her choice for antiestrogen therapy.  All her questions were answered to the best my knowledge.  She should also consider genetic testing given her strong family history.  Thank you for consulting [place] in the care of this patient.  Please do not hesitate to contact us with any additional questions or concerns.

## 2022-10-10 ENCOUNTER — Other Ambulatory Visit: Payer: Self-pay

## 2022-10-10 ENCOUNTER — Encounter: Payer: Self-pay | Admitting: Hematology and Oncology

## 2022-10-10 ENCOUNTER — Other Ambulatory Visit: Payer: Self-pay | Admitting: Hematology and Oncology

## 2022-10-10 DIAGNOSIS — C50411 Malignant neoplasm of upper-outer quadrant of right female breast: Secondary | ICD-10-CM

## 2022-10-11 ENCOUNTER — Other Ambulatory Visit: Payer: Self-pay

## 2022-10-13 ENCOUNTER — Other Ambulatory Visit: Payer: Medicare HMO

## 2022-10-13 ENCOUNTER — Other Ambulatory Visit: Payer: Self-pay | Admitting: *Deleted

## 2022-10-13 DIAGNOSIS — C50411 Malignant neoplasm of upper-outer quadrant of right female breast: Secondary | ICD-10-CM

## 2022-10-14 ENCOUNTER — Telehealth: Payer: Self-pay | Admitting: Hematology and Oncology

## 2022-10-14 ENCOUNTER — Other Ambulatory Visit: Payer: Self-pay

## 2022-10-14 NOTE — Telephone Encounter (Signed)
Spoke with patient about upcoming appointments. she will get an updated calendar on 2/14 when she come in for appointments

## 2022-10-15 ENCOUNTER — Inpatient Hospital Stay: Payer: Medicare HMO | Admitting: *Deleted

## 2022-10-15 ENCOUNTER — Other Ambulatory Visit: Payer: Self-pay

## 2022-10-15 ENCOUNTER — Inpatient Hospital Stay: Payer: Medicare HMO | Admitting: Licensed Clinical Social Worker

## 2022-10-15 ENCOUNTER — Inpatient Hospital Stay: Payer: Medicare HMO

## 2022-10-15 ENCOUNTER — Ambulatory Visit (HOSPITAL_COMMUNITY)
Admission: RE | Admit: 2022-10-15 | Discharge: 2022-10-15 | Disposition: A | Discharge status: Home / Self Care | Payer: Medicare HMO | Source: Ambulatory Visit | Attending: Hematology and Oncology | Admitting: Hematology and Oncology

## 2022-10-15 ENCOUNTER — Encounter: Payer: Self-pay | Admitting: *Deleted

## 2022-10-15 ENCOUNTER — Telehealth: Payer: Self-pay | Admitting: *Deleted

## 2022-10-15 DIAGNOSIS — C50411 Malignant neoplasm of upper-outer quadrant of right female breast: Secondary | ICD-10-CM

## 2022-10-15 DIAGNOSIS — Z0189 Encounter for other specified special examinations: Secondary | ICD-10-CM | POA: Diagnosis not present

## 2022-10-15 LAB — ECHOCARDIOGRAM COMPLETE
Area-P 1/2: 3.2 cm2
S' Lateral: 2.7 cm

## 2022-10-15 LAB — RESEARCH LABS

## 2022-10-15 MED ORDER — DEXAMETHASONE 4 MG PO TABS: ORAL_TABLET | ORAL | 1 refills | Status: DC

## 2022-10-15 MED ORDER — ONDANSETRON HCL 8 MG PO TABS: 8.0000 mg | ORAL_TABLET | Freq: Three times a day (TID) | ORAL | 1 refills | Status: DC | PRN

## 2022-10-15 MED ORDER — LIDOCAINE-PRILOCAINE 2.5-2.5 % EX CREA: TOPICAL_CREAM | CUTANEOUS | 3 refills | Status: DC

## 2022-10-15 MED ORDER — PROCHLORPERAZINE MALEATE 10 MG PO TABS: 10.0000 mg | ORAL_TABLET | Freq: Four times a day (QID) | ORAL | 1 refills | Status: DC | PRN

## 2022-10-15 NOTE — Progress Notes (Signed)
[facility] CSW Progress Note  Patient requested letter supporting her application to [employer]. Pt has already completed and submitted her portion of the application online. CSW completed and submitted letter today.    Maria Carter E Maria Klatt, LCSW

## 2022-10-15 NOTE — Research (Signed)
[employer] 2021-05 - Specimen Collection Study to Evaluate Biomarkers in Subjects with Cancer    This Coordinator has reviewed this patient's inclusion and exclusion criteria as a second review and confirms Maria Carter is eligible for study participation.  Patient may continue with enrollment.   Johny Drilling, [facility] 10/15/2022 9:31 AM

## 2022-10-15 NOTE — Research (Signed)
[employer] 2021-05 - Specimen Collection Study to Evaluate Biomarkers in Subjects with Cancer    Patient Maria Carter was identified by Dr. Chryl Heck as a potential candidate for the above listed study. This Clinical Research Nurse met with Maria Carter, Maria Carter on 10/15/22 in a manner and location that ensures patient privacy to discuss participation in the above listed research study.  Patient is Unaccompanied.  Patient was previously provided with informed consent documents.  Patient has not yet read the informed consent documents and so documents were reviewed page by page today.  As outlined in the informed consent form, this Nurse and Prescilla Sours discussed the purpose of the research study, the investigational nature of the study, study procedures and requirements for study participation, potential risks and benefits of study participation, as well as alternatives to participation.  This study is not blinded or double-blinded. The patient understands participation is voluntary and they may withdraw from study participation at any time.  This study does not involve randomization.  This study does not involve an investigational drug or device. This study does not involve a placebo. Patient understands enrollment is pending full eligibility review.   Confidentiality and how the patient's information will be used as part of study participation were discussed.  Patient was informed there is reimbursement provided for their time and effort spent on trial participation.  The patient is encouraged to discuss research study participation with their insurance provider to determine what costs they may incur as part of study participation, including research related injury.    All questions were answered to patient's satisfaction.  The informed consent with embedded HIPAA language was reviewed page by page.  The patient's mental and emotional status is appropriate to provide informed consent, and  the patient verbalizes an understanding of study participation.  Patient has agreed to participate in the above listed research study and has voluntarily signed the informed consent [employer] approved version [id number] (revised [phone]) with embedded HIPAA language, version [employer] approved version [id number] (revised [id number])  on 10/15/22 at [place].  The patient was provided with a copy of the signed informed consent form with embedded HIPAA language for their reference.  No study specific procedures were obtained prior to the signing of the informed consent document.  Approximately 30 minutes was spent with the patient reviewing the informed consent documents.  Patient was not requested to complete a Release of Information form.   Eligibility:  This Nurse has reviewed this patient's inclusion and exclusion criteria with the patient, and this nurse confirmed Maria Carter is eligible for study participation.  2nd review was completed by Johny Drilling, research coordinator. Eligibility confirmed by treating investigator, Dr. Chryl Heck, who also agrees that patient should proceed with enrollment.   Patient will continue with enrollment.  Menopausal status: Maria Carter is premenopausal per Dr. Rob Hickman consult note.    Medical History Data:  The research nurse asked the pt the following medical questions after the pt signed the consent forms.  The pt provided the following answers.    High Blood Pressure  No Coronary Artery Disease No Lupus    No Rheumatoid Arthritis  No Diabetes   No      Lynch Syndrome  No  Is the patient currently taking a magnesium supplement?   No  Does the patient have a personal history of cancer (greater than 5 years ago)?  No  Does the patient have a family history of cancer in 1st or  2nd degree relatives? No  Does the patient have history of alcohol consumption? No    Does the patient have history of cigarette, cigar, pipe, or chewing tobacco use?  No    Blood Collection:  Research blood obtained by venipuncture.  Patient tolerated the blood draw well without any adverse events.    Gift Card:  $50 gift card given to the pt by Carol Ada, study coordinator and Research officer, political party.    The pt was thanked for her participation in this data and specimen study.  The research nurse will request her tumor block to submit to the study and enter her required study data.  Brion Aliment RN, BSN, CCRP Clinical Research Nurse Lead 10/15/2022 11:02 AM

## 2022-10-15 NOTE — Telephone Encounter (Signed)
Called pt to clarify how/when to take her dexamethasone.  Spoke to Dr Chryl Heck who thought she would definitely need  3 days of steroid after her Doxorubicin/Cytoxan but didn't think she would really need it with Pembro, Taxol, Carbo. But spoke with Gilford Rile who states that the Carbo is highly emetogenic d/t AUC & suggested she take 3 days of steroids after D1 of these treatments.  Called Pt & informed.  Desk LPN sent scripts to correct pharmacy.  Pt will probably need some reinforcement.

## 2022-10-16 ENCOUNTER — Telehealth: Payer: Self-pay | Admitting: *Deleted

## 2022-10-16 ENCOUNTER — Encounter: Payer: Self-pay | Admitting: *Deleted

## 2022-10-16 ENCOUNTER — Telehealth: Payer: Self-pay | Admitting: Genetic Counselor

## 2022-10-16 ENCOUNTER — Encounter: Payer: Self-pay | Admitting: Genetic Counselor

## 2022-10-16 ENCOUNTER — Other Ambulatory Visit: Payer: Self-pay

## 2022-10-16 ENCOUNTER — Encounter (HOSPITAL_BASED_OUTPATIENT_CLINIC_OR_DEPARTMENT_OTHER): Payer: Self-pay | Admitting: Surgery

## 2022-10-16 DIAGNOSIS — Z1379 Encounter for other screening for genetic and chromosomal anomalies: Secondary | ICD-10-CM | POA: Insufficient documentation

## 2022-10-16 NOTE — Telephone Encounter (Signed)
I attempted to contact Maria Carter to discuss her genetic testing results (48 genes). Her mailbox is full.  Maria Passy, MS, [facility] Genetic Counselor [email or web address] (P) [phone]

## 2022-10-16 NOTE — Telephone Encounter (Signed)
Left vm regarding [facility] from 10/08/22. Contact information provided for questions or needs.

## 2022-10-20 ENCOUNTER — Ambulatory Visit
Admission: RE | Admit: 2022-10-20 | Discharge: 2022-10-20 | Disposition: A | Discharge status: Home / Self Care | Payer: Medicare HMO | Source: Ambulatory Visit | Attending: Hematology and Oncology | Admitting: Hematology and Oncology

## 2022-10-20 ENCOUNTER — Encounter: Payer: Self-pay | Admitting: Family Medicine

## 2022-10-20 DIAGNOSIS — C50919 Malignant neoplasm of unspecified site of unspecified female breast: Secondary | ICD-10-CM | POA: Diagnosis not present

## 2022-10-20 DIAGNOSIS — C50411 Malignant neoplasm of upper-outer quadrant of right female breast: Secondary | ICD-10-CM

## 2022-10-20 MED ORDER — ENSURE PRE-SURGERY PO LIQD: 296.0000 mL | Freq: Once | ORAL | Status: DC

## 2022-10-20 MED ORDER — GADOPICLENOL 0.5 MMOL/ML IV SOLN
7.0000 mL | Freq: Once | INTRAVENOUS | Status: AC | PRN
  Administered 2022-10-20: 7 mL via INTRAVENOUS

## 2022-10-20 NOTE — Progress Notes (Signed)

## 2022-10-21 ENCOUNTER — Encounter: Payer: Self-pay | Admitting: Genetic Counselor

## 2022-10-21 ENCOUNTER — Telehealth: Payer: Self-pay | Admitting: Genetic Counselor

## 2022-10-21 ENCOUNTER — Ambulatory Visit: Payer: Self-pay | Admitting: Genetic Counselor

## 2022-10-21 DIAGNOSIS — Z1379 Encounter for other screening for genetic and chromosomal anomalies: Secondary | ICD-10-CM

## 2022-10-21 NOTE — Progress Notes (Signed)
HPI:   Maria Carter was previously seen in the [place] clinic due to a personal and family history of cancer and concerns regarding a hereditary predisposition to cancer. Please refer to our prior cancer genetics clinic note for more information regarding our discussion, assessment and recommendations, at the time. Maria Carter recent genetic test results were disclosed to her, as were recommendations warranted by these results. These results and recommendations are discussed in more detail below.  CANCER HISTORY:  Oncology History  Malignant neoplasm of upper-outer quadrant of right female breast ([place])  09/26/2022 Mammogram   Patient had a palpable right breast mass and hence underwent bilateral diagnostic mammogram.  This confirmed a suspicious 3 cm right upper outer quadrant mass and an abnormal right axillary lymph node with cortical thickening.  Tissue sampling of both the breast mass and axillary lymph node were recommended.  Left breast was normal.   09/26/2022 Breast US   Ultrasound breast confirmed the above-mentioned findings.   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  Prognostic showed ER 50% positive weak staining PR 0% negative HER2 1+ by IHC and Ki-67 of 70%   10/06/2022 Initial Diagnosis   Malignant neoplasm of upper-outer quadrant of right female breast ([place])   10/08/2022 Cancer Staging   Staging form: Breast, AJCC 8th Edition - Clinical: Stage IIB (cT2, cN0, cM0, G3, ER+, PR-, HER2-) - Signed by Benay Pike, MD on 10/08/2022 Stage prefix: Initial diagnosis Histologic grading system: 3 grade system   10/24/2022 -  Chemotherapy   Patient is on Treatment Plan : BREAST Pembrolizumab (200) D1 + Carboplatin (5) D1 + Paclitaxel (80) D1,8,15 q21d X 4 cycles / Pembrolizumab (200) D1 + AC D1 q21d x 4 cycles      Genetic Testing   [facility] Common Cancer Panel+RNA was Negative. Report date is  10/16/2022.  The Common Hereditary Cancers Panel offered by [facility] includes sequencing and/or deletion duplication testing of the following 48 genes: APC, ATM, AXIN2, BAP1, BARD1, BMPR1A, BRCA1, BRCA2, BRIP1, CDH1, CDK4, CDKN2A (p14ARF and p16INK4a only), CHEK2, CTNNA1, DICER1, EPCAM (Deletion/duplication testing only), FH, GREM1 (promoter region duplication testing only), HOXB13, KIT, MBD4, MEN1, MLH1, MSH2, MSH3, MSH6, MUTYH, NF1, NHTL1, PALB2, PDGFRA, PMS2, POLD1, POLE, PTEN, RAD51C, RAD51D, SDHA (sequencing analysis only except exon 14), SDHB, SDHC, SDHD, SMAD4, SMARCA4. STK11, TP53, TSC1, TSC2, and VHL.     FAMILY HISTORY:  We obtained a detailed, 4-generation family history.  Significant diagnoses are listed below:      Family History  Problem Relation Age of Onset   Breast cancer Sister 22 - 19   Cancer Paternal Grandmother          unknown type       Maria Carter paternal half sister was diagnosed with breast cancer in her 58s and died in her 84s due to metastatic breast cancer. Her paternal grandmother was diagnosed with an unknown type of cancer at an unknown age, she is deceased. Maria Carter is unaware of previous family history of genetic testing for hereditary cancer risks. There is no reported Ashkenazi Jewish ancestry.  GENETIC TEST RESULTS:  The [facility] Common Cancer Panel found no pathogenic mutations.   The Common Hereditary Cancers Panel offered by [facility] includes sequencing and/or deletion duplication testing of the following 48 genes: APC, ATM, AXIN2, BAP1, BARD1, BMPR1A, BRCA1, BRCA2, BRIP1, CDH1, CDK4, CDKN2A (p14ARF and p16INK4a only), CHEK2, CTNNA1, DICER1, EPCAM (Deletion/duplication testing only), FH, GREM1 (promoter region  duplication testing only), HOXB13, KIT, MBD4, MEN1, MLH1, MSH2, MSH3, MSH6, MUTYH, NF1, NHTL1, PALB2, PDGFRA, PMS2, POLD1, POLE, PTEN, RAD51C, RAD51D, SDHA (sequencing analysis only except exon 14), SDHB, SDHC, SDHD, SMAD4, SMARCA4. STK11, TP53,  TSC1, TSC2, and VHL.   The test report has been scanned into EPIC and is located under the Molecular Pathology section of the Results Review tab.  A portion of the result report is included below for reference. Genetic testing reported out on 10/16/2022.     Even though a pathogenic variant was not identified, possible explanations for the cancer in the family may include: There may be no hereditary risk for cancer in the family. The cancers in Maria Carter and/or her family may be due to other genetic or environmental factors. There may be a gene mutation in one of these genes that current testing methods cannot detect, but that chance is small. There could be another gene that has not yet been discovered, or that we have not yet tested, that is responsible for the cancer diagnoses in the family.   Therefore, it is important to remain in touch with cancer genetics in the future so that we can continue to offer Maria Carter the most up to date genetic testing.   ADDITIONAL GENETIC TESTING:  We discussed with Maria Carter that her genetic testing was fairly extensive.  If there are genes identified to increase cancer risk that can be analyzed in the future, we would be happy to discuss and coordinate this testing at that time.    CANCER SCREENING RECOMMENDATIONS:  Maria Carter test result is considered negative (normal).  This means that we have not identified a hereditary cause for her personal and family history of cancer at this time.   An individual's cancer risk and medical management are not determined by genetic test results alone. Overall cancer risk assessment incorporates additional factors, including personal medical history, family history, and any available genetic information that may result in a personalized plan for cancer prevention and surveillance. Therefore, it is recommended she continue to follow the cancer management and screening guidelines provided by her oncology and primary  healthcare provider.  RECOMMENDATIONS FOR FAMILY MEMBERS:   Since she did not inherit a mutation in a cancer predisposition gene included on this panel, her children could not have inherited a mutation from her in one of these genes. Individuals in this family might be at some increased risk of developing cancer, over the general population risk, due to the family history of cancer. We recommend women in this family have a yearly mammogram beginning at age 60, or 54 years younger than the earliest onset of cancer, an annual clinical breast exam, and perform monthly breast self-exams.  FOLLOW-UP:  Cancer genetics is a rapidly advancing field and it is possible that new genetic tests will be appropriate for her and/or her family members in the future. We encouraged her to remain in contact with cancer genetics on an annual basis so we can update her personal and family histories and let her know of advances in cancer genetics that may benefit this family.   Our contact number was provided. Maria Carter questions were answered to her satisfaction, and she knows she is welcome to call us at anytime with additional questions or concerns.   Lucille Passy, MS, [facility] Genetic Counselor [email or web address] (P) [phone]

## 2022-10-21 NOTE — Telephone Encounter (Signed)
I contacted Ms. Ratchford to discuss her genetic testing results. No pathogenic variants were identified in the 48 genes analyzed. Detailed clinic note to follow.  The test report has been scanned into EPIC and is located under the Molecular Pathology section of the Results Review tab.  A portion of the result report is included below for reference.   Lucille Passy, MS, [facility] Genetic Counselor [email or web address] (P) [phone]

## 2022-10-22 NOTE — H&P (Signed)
  REFERRING PHYSICIAN: Mel Almond* PROVIDER: Beverlee Nims, MD MRN: [id number] DOB: 10-11-1970  Subjective  Chief Complaint: Breast Cancer  History of Present Illness: Maria Carter is a 52 y.o. female who is seen as an office consultation for evaluation of Breast Cancer  This is a 52 year old female presents with a palpable right breast mass. She reports feeling a mass several weeks ago. She underwent an ultrasound and mammogram showing the 3 cm x 2.9 cm x 2.6 cm breast mass in the right breast at the 10 o'clock position. Ultrasound the axilla was negative. A biopsy of the mass showed a high-grade 3 invasive ductal carcinoma. It was weakly 50% ER positive, PR negative, HER2 negative, and had a Ki-67 of 70%. She has a family history of a sister who died of breast cancer at age 21. She denies nipple discharge. She is otherwise without complaints and has no cardiopulmonary issues  Review of Systems: A complete review of systems was obtained from the patient. I have reviewed this information and discussed as appropriate with the patient. See HPI as well for other ROS.  ROS  Medical History: Past Medical History: Diagnosis Date Anxiety  Patient Active Problem List Diagnosis Headache Anxiety state Attention deficit disorder PTSD (post-traumatic stress disorder) Hot flashes Hair thinning Keloid scar Leukopenia Malignant neoplasm of upper-outer quadrant of right female breast (CMS-HCC) Migraine Multiple nevi Thrombocytopenia (CMS-HCC) Amenorrhea  Past Surgical History: Procedure Laterality Date .breast biopsy Right 09/30/2022   No Known Allergies  Current Outpatient Medications on File Prior to Visit Medication Sig Dispense Refill cholecalciferol (VITAMIN D3) 1000 unit tablet Take 1,000 Units by mouth once daily cyanocobalamin (VITAMIN B12) 100 MCG tablet Take 100 mcg by mouth once daily desvenlafaxine succinate (PRISTIQ) 100 MG ER tablet Take 100 mg by  mouth once daily  No current facility-administered medications on file prior to visit.  Family History Problem Relation Age of Onset Breast cancer Sister   Social History  Tobacco Use Smoking Status Never Smokeless Tobacco Never   Social History  Socioeconomic History Marital status: Single Tobacco Use Smoking status: Never Smokeless tobacco: Never Vaping Use Vaping Use: Never used Substance and Sexual Activity Alcohol use: Defer Drug use: Defer  Objective: There were no vitals filed for this visit. There is no height or weight on file to calculate BMI.  Physical Exam  She appears well on exam  There is a palpable, large mass in the upper outer quadrant of the right breast. It is mobile. There is no axillary adenopathy.  Nipple areolar complexes are normal  Labs, Imaging and Diagnostic Testing: I have reviewed her mammograms, ultrasound, and pathology results  Assessment and Plan:  Diagnoses and all orders for this visit:  Invasive ductal carcinoma of right breast (CMS-HCC)   We have discussed her in our multidisciplinary breast cancer conference. We are worried that this may be a triple negative breast cancer. Because of the size of the malignancy and and its profile, neoadjuvant chemotherapy is recommended. I discussed this with her in detail. She is also discussed this with medical oncology and agrees with the plan. We discussed proceeding with a Port-A-Cath insertion. I explained the surgical procedure in detail. We discussed the risks which includes but is not limited to bleeding, infection, injury to surrounding structures, pneumothorax, the need for other procedures, postoperative recovery, etc. When she has has completed chemotherapy, we would then proceed with a right breast lumpectomy and sentinel lymph node biopsy.

## 2022-10-23 ENCOUNTER — Encounter: Payer: Self-pay | Admitting: *Deleted

## 2022-10-23 ENCOUNTER — Other Ambulatory Visit: Payer: Self-pay

## 2022-10-23 ENCOUNTER — Ambulatory Visit (HOSPITAL_BASED_OUTPATIENT_CLINIC_OR_DEPARTMENT_OTHER)
Admission: RE | Admit: 2022-10-23 | Discharge: 2022-10-23 | Disposition: A | Discharge status: Home / Self Care | Payer: Medicare HMO | Attending: Surgery | Admitting: Surgery

## 2022-10-23 ENCOUNTER — Ambulatory Visit (HOSPITAL_BASED_OUTPATIENT_CLINIC_OR_DEPARTMENT_OTHER): Payer: Medicare HMO | Admitting: Anesthesiology

## 2022-10-23 ENCOUNTER — Encounter (HOSPITAL_BASED_OUTPATIENT_CLINIC_OR_DEPARTMENT_OTHER): Payer: Self-pay | Admitting: Surgery

## 2022-10-23 ENCOUNTER — Ambulatory Visit (HOSPITAL_COMMUNITY): Payer: Medicare HMO

## 2022-10-23 ENCOUNTER — Encounter (HOSPITAL_BASED_OUTPATIENT_CLINIC_OR_DEPARTMENT_OTHER)
Admission: RE | Disposition: A | Discharge status: Home / Self Care | Payer: Self-pay | Source: Home / Self Care | Attending: Surgery

## 2022-10-23 DIAGNOSIS — C50411 Malignant neoplasm of upper-outer quadrant of right female breast: Secondary | ICD-10-CM | POA: Diagnosis not present

## 2022-10-23 DIAGNOSIS — Z01818 Encounter for other preprocedural examination: Secondary | ICD-10-CM

## 2022-10-23 DIAGNOSIS — C50911 Malignant neoplasm of unspecified site of right female breast: Secondary | ICD-10-CM

## 2022-10-23 DIAGNOSIS — Z803 Family history of malignant neoplasm of breast: Secondary | ICD-10-CM | POA: Diagnosis not present

## 2022-10-23 DIAGNOSIS — Z170 Estrogen receptor positive status [ER+]: Secondary | ICD-10-CM | POA: Insufficient documentation

## 2022-10-23 HISTORY — DX: Personal history of other specified conditions: Z87.898

## 2022-10-23 HISTORY — PX: PORTACATH PLACEMENT: SHX2246

## 2022-10-23 LAB — POCT PREGNANCY, URINE: Preg Test, Ur: NEGATIVE

## 2022-10-23 SURGERY — INSERTION, TUNNELED CENTRAL VENOUS DEVICE, WITH PORT
Anesthesia: General | Site: Neck

## 2022-10-23 MED ORDER — SODIUM BICARBONATE 4.2 % IV SOLN
INTRAVENOUS | Status: AC
  Filled 2022-10-23: qty 10

## 2022-10-23 MED ORDER — DEXAMETHASONE SODIUM PHOSPHATE 10 MG/ML IJ SOLN
INTRAMUSCULAR | Status: AC
  Filled 2022-10-23: qty 1

## 2022-10-23 MED ORDER — LACTATED RINGERS IV SOLN: INTRAVENOUS | Status: DC

## 2022-10-23 MED ORDER — PROPOFOL 10 MG/ML IV BOLUS
INTRAVENOUS | Status: DC | PRN
  Administered 2022-10-23: 150 mg via INTRAVENOUS

## 2022-10-23 MED ORDER — ACETAMINOPHEN 500 MG PO TABS
1000.0000 mg | ORAL_TABLET | Freq: Once | ORAL | Status: AC
  Administered 2022-10-23: 1000 mg via ORAL

## 2022-10-23 MED ORDER — CEFAZOLIN SODIUM-DEXTROSE 2-4 GM/100ML-% IV SOLN
2.0000 g | INTRAVENOUS | Status: AC
  Administered 2022-10-23: 2 g via INTRAVENOUS

## 2022-10-23 MED ORDER — BUPIVACAINE HCL 0.5 % IJ SOLN
INTRAMUSCULAR | Status: DC | PRN
  Administered 2022-10-23: 10 mL

## 2022-10-23 MED ORDER — CHLORHEXIDINE GLUCONATE CLOTH 2 % EX PADS: 6.0000 | MEDICATED_PAD | Freq: Once | CUTANEOUS | Status: DC

## 2022-10-23 MED ORDER — FENTANYL CITRATE (PF) 100 MCG/2ML IJ SOLN
INTRAMUSCULAR | Status: AC
  Filled 2022-10-23: qty 2

## 2022-10-23 MED ORDER — LIDOCAINE HCL (PF) 1 % IJ SOLN
INTRAMUSCULAR | Status: AC
  Filled 2022-10-23: qty 30

## 2022-10-23 MED ORDER — EPHEDRINE SULFATE (PRESSORS) 50 MG/ML IJ SOLN
INTRAMUSCULAR | Status: DC | PRN
  Administered 2022-10-23 (×2): 5 mg via INTRAVENOUS

## 2022-10-23 MED ORDER — FENTANYL CITRATE (PF) 100 MCG/2ML IJ SOLN
INTRAMUSCULAR | Status: DC | PRN
  Administered 2022-10-23: 25 ug via INTRAVENOUS

## 2022-10-23 MED ORDER — LIDOCAINE 2% (20 MG/ML) 5 ML SYRINGE
INTRAMUSCULAR | Status: AC
  Filled 2022-10-23: qty 5

## 2022-10-23 MED ORDER — ONDANSETRON HCL 4 MG/2ML IJ SOLN
INTRAMUSCULAR | Status: DC | PRN
  Administered 2022-10-23: 4 mg via INTRAVENOUS

## 2022-10-23 MED ORDER — HEPARIN (PORCINE) IN NACL 1000-0.9 UT/500ML-% IV SOLN
INTRAVENOUS | Status: AC
  Filled 2022-10-23: qty 500

## 2022-10-23 MED ORDER — HEPARIN (PORCINE) IN NACL 2-0.9 UNITS/ML
INTRAMUSCULAR | Status: AC | PRN
  Administered 2022-10-23: 3 mL

## 2022-10-23 MED ORDER — MIDAZOLAM HCL 5 MG/5ML IJ SOLN
INTRAMUSCULAR | Status: DC | PRN
  Administered 2022-10-23: 2 mg via INTRAVENOUS

## 2022-10-23 MED ORDER — ACETAMINOPHEN 500 MG PO TABS
ORAL_TABLET | ORAL | Status: AC
  Filled 2022-10-23: qty 2

## 2022-10-23 MED ORDER — HEPARIN SOD (PORK) LOCK FLUSH 100 UNIT/ML IV SOLN
INTRAVENOUS | Status: AC
  Filled 2022-10-23: qty 5

## 2022-10-23 MED ORDER — CEFAZOLIN SODIUM-DEXTROSE 2-4 GM/100ML-% IV SOLN
INTRAVENOUS | Status: AC
  Filled 2022-10-23: qty 100

## 2022-10-23 MED ORDER — EPHEDRINE 5 MG/ML INJ
INTRAVENOUS | Status: AC
  Filled 2022-10-23: qty 5

## 2022-10-23 MED ORDER — TRAMADOL HCL 50 MG PO TABS: 50.0000 mg | ORAL_TABLET | Freq: Four times a day (QID) | ORAL | 0 refills | Status: DC | PRN

## 2022-10-23 MED ORDER — DEXAMETHASONE SODIUM PHOSPHATE 10 MG/ML IJ SOLN
INTRAMUSCULAR | Status: DC | PRN
  Administered 2022-10-23: 10 mg via INTRAVENOUS

## 2022-10-23 MED ORDER — HEPARIN SOD (PORK) LOCK FLUSH 100 UNIT/ML IV SOLN
INTRAVENOUS | Status: DC | PRN
  Administered 2022-10-23: 500 [IU]

## 2022-10-23 MED ORDER — PROPOFOL 10 MG/ML IV BOLUS
INTRAVENOUS | Status: AC
  Filled 2022-10-23: qty 20

## 2022-10-23 MED ORDER — ONDANSETRON HCL 4 MG/2ML IJ SOLN
INTRAMUSCULAR | Status: AC
  Filled 2022-10-23: qty 2

## 2022-10-23 MED ORDER — MIDAZOLAM HCL 2 MG/2ML IJ SOLN
INTRAMUSCULAR | Status: AC
  Filled 2022-10-23: qty 2

## 2022-10-23 MED ORDER — FENTANYL CITRATE (PF) 100 MCG/2ML IJ SOLN: 25.0000 ug | INTRAMUSCULAR | Status: DC | PRN

## 2022-10-23 MED ORDER — LIDOCAINE HCL (CARDIAC) PF 100 MG/5ML IV SOSY
PREFILLED_SYRINGE | INTRAVENOUS | Status: DC | PRN
  Administered 2022-10-23: 60 mg via INTRAVENOUS

## 2022-10-23 MED FILL — Fosaprepitant Dimeglumine For IV Infusion 150 MG (Base Eq): INTRAVENOUS | Qty: 5 | Status: AC

## 2022-10-23 MED FILL — Dexamethasone Sodium Phosphate Inj 100 MG/10ML: INTRAMUSCULAR | Qty: 1 | Status: AC

## 2022-10-23 SURGICAL SUPPLY — 39 items
ADH SKN CLS APL DERMABOND .7 (GAUZE/BANDAGES/DRESSINGS) ×2
APL PRP STRL LF DISP 70% ISPRP (MISCELLANEOUS) ×1
BAG DECANTER FOR FLEXI CONT (MISCELLANEOUS) ×1 IMPLANT
BLADE SURG 15 STRL LF DISP TIS (BLADE) ×1 IMPLANT
BLADE SURG 15 STRL SS (BLADE) ×1
CANISTER SUCT 1200ML W/VALVE (MISCELLANEOUS) IMPLANT
CHLORAPREP W/TINT 26 (MISCELLANEOUS) ×1 IMPLANT
COVER BACK TABLE 60X90IN (DRAPES) ×1 IMPLANT
COVER MAYO STAND STRL (DRAPES) ×1 IMPLANT
DERMABOND ADVANCED .7 DNX12 (GAUZE/BANDAGES/DRESSINGS) ×2 IMPLANT
DRAPE C-ARM 42X72 X-RAY (DRAPES) ×1 IMPLANT
DRAPE LAPAROSCOPIC ABDOMINAL (DRAPES) ×1 IMPLANT
DRAPE UTILITY XL STRL (DRAPES) ×1 IMPLANT
ELECT REM PT RETURN 9FT ADLT (ELECTROSURGICAL) ×1
ELECTRODE REM PT RTRN 9FT ADLT (ELECTROSURGICAL) ×1 IMPLANT
GAUZE 4X4 16PLY ~~LOC~~+RFID DBL (SPONGE) ×1 IMPLANT
GLOVE SURG SIGNA 7.5 PF LTX (GLOVE) ×1 IMPLANT
GOWN STRL REUS W/ TWL LRG LVL3 (GOWN DISPOSABLE) ×1 IMPLANT
GOWN STRL REUS W/ TWL XL LVL3 (GOWN DISPOSABLE) ×1 IMPLANT
GOWN STRL REUS W/TWL LRG LVL3 (GOWN DISPOSABLE) ×1
GOWN STRL REUS W/TWL XL LVL3 (GOWN DISPOSABLE) ×1
IV KIT MINILOC 20X1 SAFETY (NEEDLE) IMPLANT
KIT PORT POWER 8FR ISP CVUE (Port) IMPLANT
NDL HYPO 25X1 1.5 SAFETY (NEEDLE) ×1 IMPLANT
NEEDLE HYPO 25X1 1.5 SAFETY (NEEDLE) ×1 IMPLANT
PACK BASIN DAY SURGERY FS (CUSTOM PROCEDURE TRAY) ×1 IMPLANT
PENCIL SMOKE EVACUATOR (MISCELLANEOUS) ×1 IMPLANT
SLEEVE SCD COMPRESS KNEE MED (STOCKING) ×1 IMPLANT
SPIKE FLUID TRANSFER (MISCELLANEOUS) IMPLANT
SUT MNCRL AB 4-0 PS2 18 (SUTURE) ×1 IMPLANT
SUT PROLENE 2 0 SH DA (SUTURE) ×1 IMPLANT
SUT SILK 2 0 TIES 17X18 (SUTURE)
SUT SILK 2-0 18XBRD TIE BLK (SUTURE) IMPLANT
SUT VIC AB 3-0 SH 27 (SUTURE) ×1
SUT VIC AB 3-0 SH 27X BRD (SUTURE) ×1 IMPLANT
SYR CONTROL 10ML LL (SYRINGE) ×1 IMPLANT
TOWEL GREEN STERILE FF (TOWEL DISPOSABLE) ×1 IMPLANT
TUBE CONNECTING 20X1/4 (TUBING) IMPLANT
YANKAUER SUCT BULB TIP NO VENT (SUCTIONS) IMPLANT

## 2022-10-23 NOTE — Op Note (Signed)
INSERTION PORT-A-CATH  Procedure Note  Maria Carter 10/23/2022   Pre-op Diagnosis: RIGHT BREAST CANCER     Post-op Diagnosis: same  Procedure(s): INSERTION PORT-A-CATH (8 french) LEFT SUBCLAVIAN VEIN  Surgeon(s): Coralie Keens, MD  Anesthesia: General  Staff:  Circulator: Lauris Chroman, RN Scrub Person: Logan Bores X-Ray Technologist: Linens, Cami W, RT  Estimated Blood Loss: Minimal               Indications: This is a 52 year old female who presents with a large right breast cancer.  The decision has been made to proceed with neoadjuvant chemotherapy.  Port-A-Cath insertion is recommended  Procedure: The patient was brought to the operating room identified as a correct patient.  She was placed upon the operating table and general anesthesia was induced.  Her left chest and neck were then prepped and draped in usual sterile fashion.  I anesthetized the skin of the left chest and clavicle with Marcaine.  With the patient in the Trendelenburg position I then used the introducer needle to cannulate the left subclavian vein easily.  A wire was then passed through the needle and into the central venous system and confirmed with fluoroscopy.  The needle was then removed.  I anesthetized skin further and made an incision with a scalpel at the wire introduction site.  Using the cautery I then created a pocket for the port.  An 8 [place] Port-A-Cath was brought to the field.  The port and cath were flushed appropriately.  I placed the venous dilator introducer sheath over the wire and into the central venous system.  I then removed the wire and the dilator.  I attached the catheter to the port and cut it in appropriate length.  I placed the port into the pocket and then fed the catheter down the peel-away sheath and into the central venous system.  Placement in the superior vena cava was achieved with fluoroscopy.  I accessed the port and good flush and return were  demonstrated.  I sutured the port to the chest wall 2 separate 3-0 Prolene sutures.  I then closed the subcutaneous tissue with interrupted 3-0 Vicryl sutures and closed the skin with a running 4-0 Monocryl.  I then accessed the port through the skin and again, good flush and return were demonstrated.  I then instilled 5 cc of concentrated heparin solution into the port.  Dermabond was applied to the incision.  An occlusive dressing was then applied.  The patient tolerated the procedure well.  All the counts were correct at the end of the procedure.  She was then taken in a stable condition from the operating room to the recovery room.          Coralie Keens   Date: 10/23/2022  Time: 12:10 PM

## 2022-10-23 NOTE — Anesthesia Preprocedure Evaluation (Addendum)
Anesthesia Evaluation  Patient identified by MRN, date of birth, ID band Patient awake    Reviewed: Allergy & Precautions, NPO status , Patient's Chart, lab work & pertinent test results  Airway Mallampati: III  TM Distance: >3 FB Neck ROM: Full    Dental  (+) Poor Dentition, Dental Advisory Given, Loose, Chipped,    Pulmonary neg pulmonary ROS   Pulmonary exam normal breath sounds clear to auscultation       Cardiovascular negative cardio ROS Normal cardiovascular exam Rhythm:Regular Rate:Normal     Neuro/Psych  Headaches PSYCHIATRIC DISORDERS Anxiety Depression       GI/Hepatic negative GI ROS, Neg liver ROS,,,  Endo/Other  negative endocrine ROS    Renal/GU negative Renal ROS  negative genitourinary   Musculoskeletal negative musculoskeletal ROS (+)    Abdominal   Peds  Hematology negative hematology ROS (+)   Anesthesia Other Findings Right breast CA  Reproductive/Obstetrics                             Anesthesia Physical Anesthesia Plan  ASA: 2  Anesthesia Plan: General   Post-op Pain Management: Tylenol PO (pre-op)*   Induction: Intravenous  PONV Risk Score and Plan: 3 and Ondansetron, Dexamethasone and Midazolam  Airway Management Planned: LMA  Additional Equipment:   Intra-op Plan:   Post-operative Plan: Extubation in OR  Informed Consent: I have reviewed the patients History and Physical, chart, labs and discussed the procedure including the risks, benefits and alternatives for the proposed anesthesia with the patient or authorized representative who has indicated his/her understanding and acceptance.     Dental advisory given  Plan Discussed with: CRNA  Anesthesia Plan Comments:        Anesthesia Quick Evaluation

## 2022-10-23 NOTE — Interval H&P Note (Signed)
History and Physical Interval Note: no change in H and p  10/23/2022 9:29 AM  Maria Carter  has presented today for surgery, with the diagnosis of RIGHT BREAST CANCER.  The various methods of treatment have been discussed with the patient and family. After consideration of risks, benefits and other options for treatment, the patient has consented to  Procedure(s): INSERTION PORT-A-CATH (N/A) as a surgical intervention.  The patient's history has been reviewed, patient examined, no change in status, stable for surgery.  I have reviewed the patient's chart and labs.  Questions were answered to the patient's satisfaction.     Coralie Keens

## 2022-10-23 NOTE — Anesthesia Postprocedure Evaluation (Signed)
Anesthesia Post Note  Patient: Maria Carter  Procedure(s) Performed: INSERTION PORT-A-CATH (Neck)     Patient location during evaluation: PACU Anesthesia Type: General Level of consciousness: awake and alert Pain management: pain level controlled Vital Signs Assessment: post-procedure vital signs reviewed and stable Respiratory status: spontaneous breathing, nonlabored ventilation, respiratory function stable and patient connected to nasal cannula oxygen Cardiovascular status: stable and blood pressure returned to baseline Postop Assessment: no apparent nausea or vomiting Anesthetic complications: no  No notable events documented.  Last Vitals:  Vitals:   10/23/22 1245 10/23/22 1311  BP: 121/73 135/67  Pulse: 70 68  Resp: (!) 22 16  Temp:  36.6 C  SpO2: 99% 99%    Last Pain:  Vitals:   10/23/22 1311  TempSrc:   PainSc: 0-No pain                 Bishoy Cupp L Rosmary Dionisio

## 2022-10-23 NOTE — Discharge Instructions (Addendum)
You may shower starting tomorrow after you have completed chemotherapy  May use ice pack, Tylenol, and ibuprofen also for pain.  No vigorous activity for 1 week  Next dose of Tylenol at 3:45 today if needed   Post Anesthesia Home Care Instructions  Activity: Get plenty of rest for the remainder of the day. A responsible individual must stay with you for 24 hours following the procedure.  For the next 24 hours, DO NOT: -Drive a car -Paediatric nurse -Drink alcoholic beverages -Take any medication unless instructed by your physician -Make any legal decisions or sign important papers.  Meals: Start with liquid foods such as gelatin or soup. Progress to regular foods as tolerated. Avoid greasy, spicy, heavy foods. If nausea and/or vomiting occur, drink only clear liquids until the nausea and/or vomiting subsides. Call your physician if vomiting continues.  Special Instructions/Symptoms: Your throat may feel dry or sore from the anesthesia or the breathing tube placed in your throat during surgery. If this causes discomfort, gargle with warm salt water. The discomfort should disappear within 24 hours.  If you had a scopolamine patch placed behind your ear for the management of post- operative nausea and/or vomiting:  1. The medication in the patch is effective for 72 hours, after which it should be removed.  Wrap patch in a tissue and discard in the trash. Wash hands thoroughly with soap and water. 2. You may remove the patch earlier than 72 hours if you experience unpleasant side effects which may include dry mouth, dizziness or visual disturbances. 3. Avoid touching the patch. Wash your hands with soap and water after contact with the patch.

## 2022-10-23 NOTE — Transfer of Care (Signed)
Immediate Anesthesia Transfer of Care Note  Patient: Maria Carter  Procedure(s) Performed: INSERTION PORT-A-CATH (Neck)  Patient Location: PACU  Anesthesia Type:General  Level of Consciousness: drowsy  Airway & Oxygen Therapy: Patient Spontanous Breathing and Patient connected to face mask oxygen  Post-op Assessment: Report given to RN and Post -op Vital signs reviewed and stable  Post vital signs: Reviewed and stable  Last Vitals:  Vitals Value Taken Time  BP 117/54 10/23/22 1217  Temp 36.2 C 10/23/22 1217  Pulse 72 10/23/22 1220  Resp 18 10/23/22 1220  SpO2 100 % 10/23/22 1220  Vitals shown include unvalidated device data.  Last Pain:  Vitals:   10/23/22 0934  TempSrc: Oral  PainSc: 0-No pain         Complications: No notable events documented.

## 2022-10-23 NOTE — Anesthesia Procedure Notes (Signed)
Procedure Name: LMA Insertion Date/Time: 10/23/2022 11:35 AM  Performed by: Lavonia Dana, CRNAPre-anesthesia Checklist: Patient identified, Emergency Drugs available, Suction available and Patient being monitored Patient Re-evaluated:Patient Re-evaluated prior to induction Oxygen Delivery Method: Circle system utilized Preoxygenation: Pre-oxygenation with 100% oxygen Induction Type: IV induction Ventilation: Mask ventilation without difficulty LMA: LMA inserted LMA Size: 4.0 Number of attempts: 1 Airway Equipment and Method: Bite block Placement Confirmation: positive ETCO2 Tube secured with: Tape Dental Injury: Teeth and Oropharynx as per pre-operative assessment

## 2022-10-24 ENCOUNTER — Inpatient Hospital Stay: Payer: Medicare HMO | Admitting: Licensed Clinical Social Worker

## 2022-10-24 ENCOUNTER — Inpatient Hospital Stay (HOSPITAL_BASED_OUTPATIENT_CLINIC_OR_DEPARTMENT_OTHER): Payer: Medicare HMO | Admitting: Adult Health

## 2022-10-24 ENCOUNTER — Encounter (HOSPITAL_BASED_OUTPATIENT_CLINIC_OR_DEPARTMENT_OTHER): Payer: Self-pay | Admitting: Surgery

## 2022-10-24 ENCOUNTER — Inpatient Hospital Stay: Payer: Medicare HMO

## 2022-10-24 ENCOUNTER — Other Ambulatory Visit: Payer: Self-pay | Admitting: *Deleted

## 2022-10-24 VITALS — BP 117/66 | HR 68 | Resp 16

## 2022-10-24 DIAGNOSIS — D696 Thrombocytopenia, unspecified: Secondary | ICD-10-CM | POA: Diagnosis not present

## 2022-10-24 DIAGNOSIS — Z5111 Encounter for antineoplastic chemotherapy: Secondary | ICD-10-CM | POA: Diagnosis not present

## 2022-10-24 DIAGNOSIS — C50411 Malignant neoplasm of upper-outer quadrant of right female breast: Secondary | ICD-10-CM

## 2022-10-24 DIAGNOSIS — Z170 Estrogen receptor positive status [ER+]: Secondary | ICD-10-CM | POA: Diagnosis not present

## 2022-10-24 DIAGNOSIS — Z79899 Other long term (current) drug therapy: Secondary | ICD-10-CM | POA: Diagnosis not present

## 2022-10-24 DIAGNOSIS — Z95828 Presence of other vascular implants and grafts: Secondary | ICD-10-CM | POA: Insufficient documentation

## 2022-10-24 LAB — TSH: TSH: 0.253 u[IU]/mL — ABNORMAL LOW (ref 0.350–4.500)

## 2022-10-24 LAB — CMP (CANCER CENTER ONLY)
ALT: 16 U/L (ref 0–44)
AST: 19 U/L (ref 15–41)
Albumin: 4.1 g/dL (ref 3.5–5.0)
Alkaline Phosphatase: 65 U/L (ref 38–126)
Anion gap: 6 (ref 5–15)
BUN: 15 mg/dL (ref 6–20)
CO2: 29 mmol/L (ref 22–32)
Calcium: 9.1 mg/dL (ref 8.9–10.3)
Chloride: 104 mmol/L (ref 98–111)
Creatinine: 0.69 mg/dL (ref 0.44–1.00)
GFR, Estimated: >60 mL/min (ref >60)
Glucose, Bld: 140 mg/dL — ABNORMAL HIGH (ref 70–99)
Potassium: 3.5 mmol/L (ref 3.5–5.1)
Sodium: 139 mmol/L (ref 135–145)
Total Bilirubin: 0.4 mg/dL (ref 0.3–1.2)
Total Protein: 7.4 g/dL (ref 6.5–8.1)

## 2022-10-24 LAB — CBC WITH DIFFERENTIAL (CANCER CENTER ONLY)
Abs Immature Granulocytes: 0.01 10*3/uL (ref 0.00–0.07)
Basophils Absolute: 0 10*3/uL (ref 0.0–0.1)
Basophils Relative: 0 %
Eosinophils Absolute: 0 10*3/uL (ref 0.0–0.5)
Eosinophils Relative: 0 %
HCT: 34.4 % — ABNORMAL LOW (ref 36.0–46.0)
Hemoglobin: 11.5 g/dL — ABNORMAL LOW (ref 12.0–15.0)
Immature Granulocytes: 0 %
Lymphocytes Relative: 16 %
Lymphs Abs: 1.1 10*3/uL (ref 0.7–4.0)
MCH: 28.9 pg (ref 26.0–34.0)
MCHC: 33.4 g/dL (ref 30.0–36.0)
MCV: 86.4 fL (ref 80.0–100.0)
Monocytes Absolute: 0.5 10*3/uL (ref 0.1–1.0)
Monocytes Relative: 7 %
Neutro Abs: 5.3 10*3/uL (ref 1.7–7.7)
Neutrophils Relative %: 77 %
Platelet Count: 163 10*3/uL (ref 150–400)
RBC: 3.98 MIL/uL (ref 3.87–5.11)
RDW: 12.1 % (ref 11.5–15.5)
WBC Count: 6.9 10*3/uL (ref 4.0–10.5)
nRBC: 0 % (ref 0.0–0.2)

## 2022-10-24 MED ORDER — SODIUM CHLORIDE 0.9 % IV SOLN: Freq: Once | INTRAVENOUS | Status: AC

## 2022-10-24 MED ORDER — SODIUM CHLORIDE 0.9% FLUSH
10.0000 mL | Freq: Once | INTRAVENOUS | Status: AC
  Administered 2022-10-24: 10 mL

## 2022-10-24 MED ORDER — DIPHENHYDRAMINE HCL 50 MG/ML IJ SOLN
50.0000 mg | Freq: Once | INTRAMUSCULAR | Status: AC
  Administered 2022-10-24: 50 mg via INTRAVENOUS
  Filled 2022-10-24: qty 1

## 2022-10-24 MED ORDER — SODIUM CHLORIDE 0.9 % IV SOLN
80.0000 mg/m2 | Freq: Once | INTRAVENOUS | Status: AC
  Administered 2022-10-24: 138 mg via INTRAVENOUS
  Filled 2022-10-24: qty 23

## 2022-10-24 MED ORDER — SODIUM CHLORIDE 0.9 % IV SOLN
10.0000 mg | Freq: Once | INTRAVENOUS | Status: AC
  Administered 2022-10-24: 10 mg via INTRAVENOUS
  Filled 2022-10-24: qty 10

## 2022-10-24 MED ORDER — SODIUM CHLORIDE 0.9 % IV SOLN
150.0000 mg | Freq: Once | INTRAVENOUS | Status: AC
  Administered 2022-10-24: 150 mg via INTRAVENOUS
  Filled 2022-10-24: qty 150

## 2022-10-24 MED ORDER — FAMOTIDINE IN NACL 20-0.9 MG/50ML-% IV SOLN
20.0000 mg | Freq: Once | INTRAVENOUS | Status: AC
  Administered 2022-10-24: 20 mg via INTRAVENOUS
  Filled 2022-10-24: qty 50

## 2022-10-24 MED ORDER — SODIUM CHLORIDE 0.9% FLUSH
10.0000 mL | INTRAVENOUS | Status: DC | PRN
  Administered 2022-10-24: 10 mL

## 2022-10-24 MED ORDER — SODIUM CHLORIDE 0.9 % IV SOLN
599.0000 mg | Freq: Once | INTRAVENOUS | Status: AC
  Administered 2022-10-24: 600 mg via INTRAVENOUS
  Filled 2022-10-24: qty 60

## 2022-10-24 MED ORDER — SODIUM CHLORIDE 0.9 % IV SOLN
200.0000 mg | Freq: Once | INTRAVENOUS | Status: AC
  Administered 2022-10-24: 200 mg via INTRAVENOUS
  Filled 2022-10-24: qty 8

## 2022-10-24 MED ORDER — HEPARIN SOD (PORK) LOCK FLUSH 100 UNIT/ML IV SOLN
500.0000 [IU] | Freq: Once | INTRAVENOUS | Status: AC | PRN
  Administered 2022-10-24: 500 [IU]

## 2022-10-24 MED ORDER — PALONOSETRON HCL INJECTION 0.25 MG/5ML
0.2500 mg | Freq: Once | INTRAVENOUS | Status: AC
  Administered 2022-10-24: 0.25 mg via INTRAVENOUS
  Filled 2022-10-24: qty 5

## 2022-10-24 NOTE — Progress Notes (Signed)
[facility] Follow up:    [name], NP-C [place] [address]   DIAGNOSIS:  Cancer Staging  Malignant neoplasm of upper-outer quadrant of right female breast [facility]) Staging form: Breast, AJCC 8th Edition - Clinical: Stage IIB (cT2, cN0, cM0, G3, ER+, PR-, HER2-) - Signed by Benay Pike, MD on 10/08/2022 Stage prefix: Initial diagnosis Histologic grading system: 3 grade system - Clinical: No stage assigned - Unsigned   SUMMARY OF ONCOLOGIC HISTORY: Oncology History  Malignant neoplasm of upper-outer quadrant of right female breast ([place])  09/26/2022 Mammogram   Patient had a palpable right breast mass and hence underwent bilateral diagnostic mammogram.  This confirmed a suspicious 3 cm right upper outer quadrant mass and an abnormal right axillary lymph node with cortical thickening.  Tissue sampling of both the breast mass and axillary lymph node were recommended.  Left breast was normal.   09/26/2022 Breast US   Ultrasound breast confirmed the above-mentioned findings.   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  Prognostic showed ER 50% positive weak staining PR 0% negative HER2 1+ by IHC and Ki-67 of 70%   10/06/2022 Initial Diagnosis   Malignant neoplasm of upper-outer quadrant of right female breast ([place])   10/08/2022 Cancer Staging   Staging form: Breast, AJCC 8th Edition - Clinical: Stage IIB (cT2, cN0, cM0, G3, ER+, PR-, HER2-) - Signed by Benay Pike, MD on 10/08/2022 Stage prefix: Initial diagnosis Histologic grading system: 3 grade system   10/24/2022 -  Chemotherapy   Patient is on Treatment Plan : BREAST Pembrolizumab (200) D1 + Carboplatin (5) D1 + Paclitaxel (80) D1,8,15 q21d X 4 cycles / Pembrolizumab (200) D1 + AC D1 q21d x 4 cycles      Genetic Testing   [facility] Common Cancer Panel+RNA was Negative. Report date is 10/16/2022.  The Common Hereditary  Cancers Panel offered by [facility] includes sequencing and/or deletion duplication testing of the following 48 genes: APC, ATM, AXIN2, BAP1, BARD1, BMPR1A, BRCA1, BRCA2, BRIP1, CDH1, CDK4, CDKN2A (p14ARF and p16INK4a only), CHEK2, CTNNA1, DICER1, EPCAM (Deletion/duplication testing only), FH, GREM1 (promoter region duplication testing only), HOXB13, KIT, MBD4, MEN1, MLH1, MSH2, MSH3, MSH6, MUTYH, NF1, NHTL1, PALB2, PDGFRA, PMS2, POLD1, POLE, PTEN, RAD51C, RAD51D, SDHA (sequencing analysis only except exon 14), SDHB, SDHC, SDHD, SMAD4, SMARCA4. STK11, TP53, TSC1, TSC2, and VHL.     CURRENT THERAPY Neoadjuvant Taxol, carboplatin, pembrolizumab  INTERVAL HISTORY: BUFF NELSON 52 y.o. female returns for follow-up and evaluation prior to beginning neoadjuvant chemotherapy.  She underwent port placement with Dr. Ninfa Linden yesterday.  She is slightly nervous about starting chemotherapy but otherwise is doing well.  She had her port placed yesterday and it is a little bit sore but otherwise she has no concerns.   Patient Active Problem List   Diagnosis Date Noted   Port-A-Cath in place 10/24/2022   Genetic testing 10/16/2022   Malignant neoplasm of upper-outer quadrant of right female breast ([place]) 10/06/2022   Thrombocytopenia ([place]) 03/05/2022   Leukopenia 03/05/2022   Hot flashes 02/28/2022   Hair thinning 02/28/2022   Amenorrhea 02/28/2022   Screening for colon cancer 02/28/2022   Multiple nevi 02/28/2022   PTSD (post-traumatic stress disorder) 02/08/2021   Mild episode of recurrent major depressive disorder ([place]) 09/12/2013   Attention deficit disorder 04/25/2013   Migraine 02/05/2013   KELOID SCAR 05/09/2010   URINALYSIS, ABNORMAL 05/09/2010   ANXIETY 03/01/2010   DEPRESSION  03/01/2010   HEADACHE 03/01/2010   Intermittent chest pain 03/01/2010    has No Known Allergies.  MEDICAL HISTORY: Past Medical History:  Diagnosis Date   Anxiety    Breast cancer ([place])    Depression    Hx  of febrile seizure    1980    SURGICAL HISTORY: Past Surgical History:  Procedure Laterality Date   BREAST BIOPSY Right 09/30/2022   [place] RT BREAST BX W LOC DEV 1ST LESION IMG BX SPEC US GUIDE 09/30/2022 [facility] MAMMOGRAPHY   PORTACATH PLACEMENT N/A 10/23/2022   Procedure: INSERTION PORT-A-CATH;  Surgeon: Coralie Keens, MD;  Location: [facility];  Service: General;  Laterality: N/A;    SOCIAL HISTORY: Social History   Socioeconomic History   Marital status: Single    Spouse name: Not on file   Number of children: Not on file   Years of education: Not on file   Highest education level: Not on file  Occupational History   Not on file  Tobacco Use   Smoking status: Never   Smokeless tobacco: Never  Vaping Use   Vaping Use: Never used  Substance and Sexual Activity   Alcohol use: Yes    Comment: social   Drug use: No   Sexual activity: Not Currently    Partners: Male  Other Topics Concern   Not on file  Social History Narrative   Not on file   Social Determinants of Health   Financial Resource Strain: High Risk (10/08/2022)   Overall Financial Resource Strain (CARDIA)    Difficulty of Paying Living Expenses: Hard  Food Insecurity: Food Insecurity Present (10/08/2022)   Hunger Vital Sign    Worried About Running Out of Food in the Last Year: Sometimes true    Ran Out of Food in the Last Year: Sometimes true  Transportation Needs: No Transportation Needs (10/08/2022)   PRAPARE - Hydrologist (Medical): No    Lack of Transportation (Non-Medical): No  Physical Activity: Not on file  Stress: Not on file  Social Connections: Not on file  Intimate Partner Violence: Not on file    FAMILY HISTORY: Family History  Problem Relation Age of Onset   Breast cancer Sister 48 - 63   Cancer Paternal Grandmother        unknown type    Review of Systems  Constitutional:  Negative for appetite change, chills, fatigue, fever and unexpected  weight change.  HENT:   Negative for hearing loss, lump/mass and trouble swallowing.   Eyes:  Negative for eye problems and icterus.  Respiratory:  Negative for chest tightness, cough and shortness of breath.   Cardiovascular:  Negative for chest pain, leg swelling and palpitations.  Gastrointestinal:  Negative for abdominal distention, abdominal pain, constipation, diarrhea, nausea and vomiting.  Endocrine: Negative for hot flashes.  Genitourinary:  Negative for difficulty urinating.   Musculoskeletal:  Negative for arthralgias.  Skin:  Negative for itching and rash.  Neurological:  Negative for dizziness, extremity weakness, headaches and numbness.  Hematological:  Negative for adenopathy. Does not bruise/bleed easily.  Psychiatric/Behavioral:  Negative for depression. The patient is not nervous/anxious.       PHYSICAL EXAMINATION  ECOG PERFORMANCE STATUS: 1 - Symptomatic but completely ambulatory  Vitals:   10/24/22 1140  BP: 130/72  Pulse: 73  Resp: 14  Temp: (!) 97.3 F (36.3 C)  SpO2: 100%    Physical Exam Constitutional:      General: She is not  in acute distress.    Appearance: Normal appearance. She is not toxic-appearing.  HENT:     Head: Normocephalic and atraumatic.  Eyes:     General: No scleral icterus. Cardiovascular:     Rate and Rhythm: Normal rate and regular rhythm.     Pulses: Normal pulses.     Heart sounds: Normal heart sounds.  Pulmonary:     Effort: Pulmonary effort is normal.     Breath sounds: Normal breath sounds.  Abdominal:     General: Abdomen is flat. Bowel sounds are normal. There is no distension.     Palpations: Abdomen is soft.     Tenderness: There is no abdominal tenderness.  Musculoskeletal:        General: No swelling.     Cervical back: Neck supple.  Lymphadenopathy:     Cervical: No cervical adenopathy.  Skin:    General: Skin is warm and dry.     Findings: No rash.  Neurological:     General: No focal deficit present.      Mental Status: She is alert.  Psychiatric:        Mood and Affect: Mood normal.        Behavior: Behavior normal.     LABORATORY DATA:  CBC    Component Value Date/Time   WBC 6.9 10/24/2022 1047   WBC 2.9 (L) 02/28/2022 1049   RBC 3.98 10/24/2022 1047   HGB 11.5 (L) 10/24/2022 1047   HCT 34.4 (L) 10/24/2022 1047   PLT 163 10/24/2022 1047   MCV 86.4 10/24/2022 1047   MCH 28.9 10/24/2022 1047   MCHC 33.4 10/24/2022 1047   RDW 12.1 10/24/2022 1047   LYMPHSABS 1.1 10/24/2022 1047   MONOABS 0.5 10/24/2022 1047   EOSABS 0.0 10/24/2022 1047   BASOSABS 0.0 10/24/2022 1047    CMP     Component Value Date/Time   NA 139 10/24/2022 1047   K 3.5 10/24/2022 1047   CL 104 10/24/2022 1047   CO2 29 10/24/2022 1047   GLUCOSE 140 (H) 10/24/2022 1047   BUN 15 10/24/2022 1047   CREATININE 0.69 10/24/2022 1047   CALCIUM 9.1 10/24/2022 1047   PROT 7.4 10/24/2022 1047   ALBUMIN 4.1 10/24/2022 1047   AST 19 10/24/2022 1047   ALT 16 10/24/2022 1047   ALKPHOS 65 10/24/2022 1047   BILITOT 0.4 10/24/2022 1047   GFRNONAA >60 10/24/2022 1047   GFRAA >60 12/23/2015 1151         ASSESSMENT and THERAPY PLAN:   Malignant neoplasm of upper-outer quadrant of right female breast ([place]) Vickey is a 52 year old woman with stage IIb ER positive right-sided breast cancer here today for evaluation prior to beginning neoadjuvant chemotherapy.  She will begin to receive Taxol, carboplatin, and pembrolizumab today.  I reviewed that she will get the Taxol and [place] weekly and the pembrolizumab she will receive every 3 weeks.  We discussed risks and benefits in detail she verbalized understanding and agreement to proceed.  She has received her antinausea medication and I did reinforce how she should take her antinausea medication in detail.  We will follow her closely and see her next week for labs, follow-up, and her next infusion.   All questions were answered. The patient knows to call the  clinic with any problems, questions or concerns. We can certainly see the patient much sooner if necessary.  Total encounter time:30 minutes*in face-to-face visit time, chart review, lab review, care coordination, order entry, and documentation of  the encounter time.    Wilber Bihari, NP 10/24/22 2:32 PM Medical Oncology and Hematology [facility] [place], [place] [address] Tel. [phone]    Fax. [phone]  *Total Encounter Time as defined by the Centers for Medicare and Medicaid Services includes, in addition to the face-to-face time of a patient visit (documented in the note above) non-face-to-face time: obtaining and reviewing outside history, ordering and reviewing medications, tests or procedures, care coordination (communications with other health care professionals or caregivers) and documentation in the medical record.

## 2022-10-24 NOTE — Patient Instructions (Signed)
Maria Carter  Discharge Instructions: Thank you for choosing [facility] to provide your oncology and hematology care.   If you have a lab appointment with the [facility], please go directly to the [facility] and check in at the registration area.   Wear comfortable clothing and clothing appropriate for easy access to any Portacath or PICC line.   We strive to give you quality time with your provider. You may need to reschedule your appointment if you arrive late (15 or more minutes).  Arriving late affects you and other patients whose appointments are after yours.  Also, if you miss three or more appointments without notifying the office, you may be dismissed from the clinic at the provider's discretion.      For prescription refill requests, have your pharmacy contact our office and allow 72 hours for refills to be completed.    Today you received the following chemotherapy and/or immunotherapy agents: Keytruda, Paclitaxel, and Carboplatin      To help prevent nausea and vomiting after your treatment, we encourage you to take your nausea medication as directed.  BELOW ARE SYMPTOMS THAT SHOULD BE REPORTED IMMEDIATELY: *FEVER GREATER THAN 100.4 F (38 C) OR HIGHER *CHILLS OR SWEATING *NAUSEA AND VOMITING THAT IS NOT CONTROLLED WITH YOUR NAUSEA MEDICATION *UNUSUAL SHORTNESS OF BREATH *UNUSUAL BRUISING OR BLEEDING *URINARY PROBLEMS (pain or burning when urinating, or frequent urination) *BOWEL PROBLEMS (unusual diarrhea, constipation, pain near the anus) TENDERNESS IN MOUTH AND THROAT WITH OR WITHOUT PRESENCE OF ULCERS (sore throat, sores in mouth, or a toothache) UNUSUAL RASH, SWELLING OR PAIN  UNUSUAL VAGINAL DISCHARGE OR ITCHING   Items with * indicate a potential emergency and should be followed up as soon as possible or go to the Emergency Department if any problems should occur.  Please show the CHEMOTHERAPY ALERT CARD or  IMMUNOTHERAPY ALERT CARD at check-in to the Emergency Department and triage nurse.  Should you have questions after your visit or need to cancel or reschedule your appointment, please contact [facility]  Dept: [phone]  and follow the prompts.  Office hours are 8:00 a.m. to 4:30 p.m. Monday - Friday. Please note that voicemails left after 4:00 p.m. may not be returned until the following business day.  We are closed weekends and major holidays. You have access to a nurse at all times for urgent questions. Please call the main number to the clinic Dept: [phone] and follow the prompts.   For any non-urgent questions, you may also contact your provider using MyChart. We now offer e-Visits for anyone 38 and older to request care online for non-urgent symptoms. For details visit [email or web address].   Also download the MyChart app! Go to the app store, search "MyChart", open the app, select [facility], and log in with your MyChart username and password.  Pembrolizumab Injection What is this medication? PEMBROLIZUMAB (PEM broe LIZ ue mab) treats some types of cancer. It works by helping your immune system slow or stop the spread of cancer cells. It is a monoclonal antibody. This medicine may be used for other purposes; ask your health care provider or pharmacist if you have questions. COMMON BRAND NAME(S): Keytruda What should I tell my care team before I take this medication? They need to know if you have any of these conditions: Allogeneic stem cell transplant (uses someone else's stem cells) Autoimmune diseases, such as Crohn disease, ulcerative colitis, lupus History of  chest radiation Nervous system problems, such as Guillain-Barre syndrome, myasthenia gravis Organ transplant An unusual or allergic reaction to pembrolizumab, other medications, foods, dyes, or preservatives Pregnant or trying to get pregnant Breast-feeding How should I use  this medication? This medication is injected into a vein. It is given by your care team in a hospital or clinic setting. A special MedGuide will be given to you before each treatment. Be sure to read this information carefully each time. Talk to your care team about the use of this medication in children. While it may be prescribed for children as young as 6 months for selected conditions, precautions do apply. Overdosage: If you think you have taken too much of this medicine contact a poison control center or emergency room at once. NOTE: This medicine is only for you. Do not share this medicine with others. What if I miss a dose? Keep appointments for follow-up doses. It is important not to miss your dose. Call your care team if you are unable to keep an appointment. What may interact with this medication? Interactions have not been studied. This list may not describe all possible interactions. Give your health care provider a list of all the medicines, herbs, non-prescription drugs, or dietary supplements you use. Also tell them if you smoke, drink alcohol, or use illegal drugs. Some items may interact with your medicine. What should I watch for while using this medication? Your condition will be monitored carefully while you are receiving this medication. You may need blood work while taking this medication. This medication may cause serious skin reactions. They can happen weeks to months after starting the medication. Contact your care team right away if you notice fevers or flu-like symptoms with a rash. The rash may be red or purple and then turn into blisters or peeling of the skin. You may also notice a red rash with swelling of the face, lips, or lymph nodes in your neck or under your arms. Tell your care team right away if you have any change in your eyesight. Talk to your care team if you may be pregnant. Serious birth defects can occur if you take this medication during pregnancy and for  4 months after the last dose. You will need a negative pregnancy test before starting this medication. Contraception is recommended while taking this medication and for 4 months after the last dose. Your care team can help you find the option that works for you. Do not breastfeed while taking this medication and for 4 months after the last dose. What side effects may I notice from receiving this medication? Side effects that you should report to your care team as soon as possible: Allergic reactions--skin rash, itching, hives, swelling of the face, lips, tongue, or throat Dry cough, shortness of breath or trouble breathing Eye pain, redness, irritation, or discharge with blurry or decreased vision Heart muscle inflammation--unusual weakness or fatigue, shortness of breath, chest pain, fast or irregular heartbeat, dizziness, swelling of the ankles, feet, or hands Hormone gland problems--headache, sensitivity to light, unusual weakness or fatigue, dizziness, fast or irregular heartbeat, increased sensitivity to cold or heat, excessive sweating, constipation, hair loss, increased thirst or amount of urine, tremors or shaking, irritability Infusion reactions--chest pain, shortness of breath or trouble breathing, feeling faint or lightheaded Kidney injury (glomerulonephritis)--decrease in the amount of urine, red or dark brown urine, foamy or bubbly urine, swelling of the ankles, hands, or feet Liver injury--right upper belly pain, loss of appetite, nausea, light-colored stool,  dark yellow or brown urine, yellowing skin or eyes, unusual weakness or fatigue Pain, tingling, or numbness in the hands or feet, muscle weakness, change in vision, confusion or trouble speaking, loss of balance or coordination, trouble walking, seizures Rash, fever, and swollen lymph nodes Redness, blistering, peeling, or loosening of the skin, including inside the mouth Sudden or severe stomach pain, bloody diarrhea, fever,  nausea, vomiting Side effects that usually do not require medical attention (report to your care team if they continue or are bothersome): Bone, joint, or muscle pain Diarrhea Fatigue Loss of appetite Nausea Skin rash This list may not describe all possible side effects. Call your doctor for medical advice about side effects. You may report side effects to FDA at 1-800-FDA-1088. Where should I keep my medication? This medication is given in a hospital or clinic. It will not be stored at home. NOTE: This sheet is a summary. It may not cover all possible information. If you have questions about this medicine, talk to your doctor, pharmacist, or health care provider.  2023 Elsevier/Gold Standard (2021-12-31 00:00:00)  Paclitaxel Injection What is this medication? PACLITAXEL (PAK li TAX el) treats some types of cancer. It works by slowing down the growth of cancer cells. This medicine may be used for other purposes; ask your health care provider or pharmacist if you have questions. COMMON BRAND NAME(S): Onxol, Taxol What should I tell my care team before I take this medication? They need to know if you have any of these conditions: Heart disease Liver disease Low white blood cell levels An unusual or allergic reaction to paclitaxel, other medications, foods, dyes, or preservatives If you or your partner are pregnant or trying to get pregnant Breast-feeding How should I use this medication? This medication is injected into a vein. It is given by your care team in a hospital or clinic setting. Talk to your care team about the use of this medication in children. While it may be given to children for selected conditions, precautions do apply. Overdosage: If you think you have taken too much of this medicine contact a poison control center or emergency room at once. NOTE: This medicine is only for you. Do not share this medicine with others. What if I miss a dose? Keep appointments for  follow-up doses. It is important not to miss your dose. Call your care team if you are unable to keep an appointment. What may interact with this medication? Do not take this medication with any of the following: Live virus vaccines Other medications may affect the way this medication works. Talk with your care team about all of the medications you take. They may suggest changes to your treatment plan to lower the risk of side effects and to make sure your medications work as intended. This list may not describe all possible interactions. Give your health care provider a list of all the medicines, herbs, non-prescription drugs, or dietary supplements you use. Also tell them if you smoke, drink alcohol, or use illegal drugs. Some items may interact with your medicine. What should I watch for while using this medication? Your condition will be monitored carefully while you are receiving this medication. You may need blood work while taking this medication. This medication may make you feel generally unwell. This is not uncommon as chemotherapy can affect healthy cells as well as cancer cells. Report any side effects. Continue your course of treatment even though you feel ill unless your care team tells you to stop. This  medication can cause serious allergic reactions. To reduce the risk, your care team may give you other medications to take before receiving this one. Be sure to follow the directions from your care team. This medication may increase your risk of getting an infection. Call your care team for advice if you get a fever, chills, sore throat, or other symptoms of a cold or flu. Do not treat yourself. Try to avoid being around people who are sick. This medication may increase your risk to bruise or bleed. Call your care team if you notice any unusual bleeding. Be careful brushing or flossing your teeth or using a toothpick because you may get an infection or bleed more easily. If you have any  dental work done, tell your dentist you are receiving this medication. Talk to your care team if you may be pregnant. Serious birth defects can occur if you take this medication during pregnancy. Talk to your care team before breastfeeding. Changes to your treatment plan may be needed. What side effects may I notice from receiving this medication? Side effects that you should report to your care team as soon as possible: Allergic reactions--skin rash, itching, hives, swelling of the face, lips, tongue, or throat Heart rhythm changes--fast or irregular heartbeat, dizziness, feeling faint or lightheaded, chest pain, trouble breathing Increase in blood pressure Infection--fever, chills, cough, sore throat, wounds that don't heal, pain or trouble when passing urine, general feeling of discomfort or being unwell Low blood pressure--dizziness, feeling faint or lightheaded, blurry vision Low red blood cell level--unusual weakness or fatigue, dizziness, headache, trouble breathing Painful swelling, warmth, or redness of the skin, blisters or sores at the infusion site Pain, tingling, or numbness in the hands or feet Slow heartbeat--dizziness, feeling faint or lightheaded, confusion, trouble breathing, unusual weakness or fatigue Unusual bruising or bleeding Side effects that usually do not require medical attention (report to your care team if they continue or are bothersome): Diarrhea Hair loss Joint pain Loss of appetite Muscle pain Nausea Vomiting This list may not describe all possible side effects. Call your doctor for medical advice about side effects. You may report side effects to FDA at 1-800-FDA-1088. Where should I keep my medication? This medication is given in a hospital or clinic. It will not be stored at home. NOTE: This sheet is a summary. It may not cover all possible information. If you have questions about this medicine, talk to your doctor, pharmacist, or health care  provider.  2023 Elsevier/Gold Standard (2022-01-02 00:00:00)   Carboplatin Injection What is this medication? CARBOPLATIN (KAR boe pla tin) treats some types of cancer. It works by slowing down the growth of cancer cells. This medicine may be used for other purposes; ask your health care provider or pharmacist if you have questions. COMMON BRAND NAME(S): Paraplatin What should I tell my care team before I take this medication? They need to know if you have any of these conditions: Blood disorders Hearing problems Kidney disease Recent or ongoing radiation therapy An unusual or allergic reaction to carboplatin, cisplatin, other medications, foods, dyes, or preservatives Pregnant or trying to get pregnant Breast-feeding How should I use this medication? This medication is injected into a vein. It is given by your care team in a hospital or clinic setting. Talk to your care team about the use of this medication in children. Special care may be needed. Overdosage: If you think you have taken too much of this medicine contact a poison control center or emergency room  at once. NOTE: This medicine is only for you. Do not share this medicine with others. What if I miss a dose? Keep appointments for follow-up doses. It is important not to miss your dose. Call your care team if you are unable to keep an appointment. What may interact with this medication? Medications for seizures Some antibiotics, such as amikacin, gentamicin, neomycin, streptomycin, tobramycin Vaccines This list may not describe all possible interactions. Give your health care provider a list of all the medicines, herbs, non-prescription drugs, or dietary supplements you use. Also tell them if you smoke, drink alcohol, or use illegal drugs. Some items may interact with your medicine. What should I watch for while using this medication? Your condition will be monitored carefully while you are receiving this medication. You may  need blood work while taking this medication. This medication may make you feel generally unwell. This is not uncommon, as chemotherapy can affect healthy cells as well as cancer cells. Report any side effects. Continue your course of treatment even though you feel ill unless your care team tells you to stop. In some cases, you may be given additional medications to help with side effects. Follow all directions for their use. This medication may increase your risk of getting an infection. Call your care team for advice if you get a fever, chills, sore throat, or other symptoms of a cold or flu. Do not treat yourself. Try to avoid being around people who are sick. Avoid taking medications that contain aspirin, acetaminophen, ibuprofen, naproxen, or ketoprofen unless instructed by your care team. These medications may hide a fever. Be careful brushing or flossing your teeth or using a toothpick because you may get an infection or bleed more easily. If you have any dental work done, tell your dentist you are receiving this medication. Talk to your care team if you wish to become pregnant or think you might be pregnant. This medication can cause serious birth defects. Talk to your care team about effective forms of contraception. Do not breast-feed while taking this medication. What side effects may I notice from receiving this medication? Side effects that you should report to your care team as soon as possible: Allergic reactions--skin rash, itching, hives, swelling of the face, lips, tongue, or throat Infection--fever, chills, cough, sore throat, wounds that don't heal, pain or trouble when passing urine, general feeling of discomfort or being unwell Low red blood cell level--unusual weakness or fatigue, dizziness, headache, trouble breathing Pain, tingling, or numbness in the hands or feet, muscle weakness, change in vision, confusion or trouble speaking, loss of balance or coordination, trouble  walking, seizures Unusual bruising or bleeding Side effects that usually do not require medical attention (report to your care team if they continue or are bothersome): Hair loss Nausea Unusual weakness or fatigue Vomiting This list may not describe all possible side effects. Call your doctor for medical advice about side effects. You may report side effects to FDA at 1-800-FDA-1088. Where should I keep my medication? This medication is given in a hospital or clinic. It will not be stored at home. NOTE: This sheet is a summary. It may not cover all possible information. If you have questions about this medicine, talk to your doctor, pharmacist, or health care provider.  2023 Elsevier/Gold Standard (2007-10-09 00:00:00)

## 2022-10-24 NOTE — Progress Notes (Signed)
[facility] CSW Progress Note  [facility] representative met with patient to follow-up on resources. Provided bag from food pantry today. Patient is not sure if she received the email from [facility] with additional resources. CSW re-sent today.  Also discussed transportation- CSW encouraged pt to contact her insurance to determine her benefit and number of rides available and sent a referral to [facility].    Jonita Hirota E Katryn Plummer, LCSW

## 2022-10-24 NOTE — Assessment & Plan Note (Addendum)
Maria Carter is a 52 year old woman with stage IIb ER positive right-sided breast cancer here today for evaluation prior to beginning neoadjuvant chemotherapy.  She will begin to receive Taxol, carboplatin, and pembrolizumab today.  I reviewed that she will get the Taxol and [place] weekly and the pembrolizumab she will receive every 3 weeks.  We discussed risks and benefits in detail she verbalized understanding and agreement to proceed.  She has received her antinausea medication and I did reinforce how she should take her antinausea medication in detail.  We will follow her closely and see her next week for labs, follow-up, and her next infusion.

## 2022-10-25 LAB — T4: T4, Total: 7.5 ug/dL (ref 4.5–12.0)

## 2022-10-27 ENCOUNTER — Encounter: Payer: Self-pay | Admitting: Hematology and Oncology

## 2022-10-30 MED FILL — Dexamethasone Sodium Phosphate Inj 100 MG/10ML: INTRAMUSCULAR | Qty: 1 | Status: AC

## 2022-10-31 ENCOUNTER — Encounter: Payer: Self-pay | Admitting: Adult Health

## 2022-10-31 ENCOUNTER — Inpatient Hospital Stay (HOSPITAL_BASED_OUTPATIENT_CLINIC_OR_DEPARTMENT_OTHER): Payer: Medicare HMO | Admitting: Adult Health

## 2022-10-31 ENCOUNTER — Inpatient Hospital Stay: Payer: Medicare HMO | Attending: Physician Assistant

## 2022-10-31 ENCOUNTER — Inpatient Hospital Stay: Payer: Medicare HMO

## 2022-10-31 VITALS — BP 117/79 | HR 78 | Resp 16

## 2022-10-31 DIAGNOSIS — C50411 Malignant neoplasm of upper-outer quadrant of right female breast: Secondary | ICD-10-CM

## 2022-10-31 DIAGNOSIS — Z95828 Presence of other vascular implants and grafts: Secondary | ICD-10-CM

## 2022-10-31 DIAGNOSIS — Z79899 Other long term (current) drug therapy: Secondary | ICD-10-CM | POA: Diagnosis not present

## 2022-10-31 DIAGNOSIS — Z170 Estrogen receptor positive status [ER+]: Secondary | ICD-10-CM | POA: Diagnosis not present

## 2022-10-31 DIAGNOSIS — Z803 Family history of malignant neoplasm of breast: Secondary | ICD-10-CM | POA: Insufficient documentation

## 2022-10-31 DIAGNOSIS — Z5111 Encounter for antineoplastic chemotherapy: Secondary | ICD-10-CM | POA: Diagnosis not present

## 2022-10-31 LAB — CBC WITH DIFFERENTIAL (CANCER CENTER ONLY)
Abs Immature Granulocytes: 0.01 10*3/uL (ref 0.00–0.07)
Basophils Absolute: 0 10*3/uL (ref 0.0–0.1)
Basophils Relative: 1 %
Eosinophils Absolute: 0.1 10*3/uL (ref 0.0–0.5)
Eosinophils Relative: 5 %
HCT: 33.5 % — ABNORMAL LOW (ref 36.0–46.0)
Hemoglobin: 11.4 g/dL — ABNORMAL LOW (ref 12.0–15.0)
Immature Granulocytes: 1 %
Lymphocytes Relative: 33 %
Lymphs Abs: 0.7 10*3/uL (ref 0.7–4.0)
MCH: 29.4 pg (ref 26.0–34.0)
MCHC: 34 g/dL (ref 30.0–36.0)
MCV: 86.3 fL (ref 80.0–100.0)
Monocytes Absolute: 0.2 10*3/uL (ref 0.1–1.0)
Monocytes Relative: 8 %
Neutro Abs: 1.1 10*3/uL — ABNORMAL LOW (ref 1.7–7.7)
Neutrophils Relative %: 52 %
Platelet Count: 112 10*3/uL — ABNORMAL LOW (ref 150–400)
RBC: 3.88 MIL/uL (ref 3.87–5.11)
RDW: 11.6 % (ref 11.5–15.5)
WBC Count: 2.1 10*3/uL — ABNORMAL LOW (ref 4.0–10.5)
nRBC: 0 % (ref 0.0–0.2)

## 2022-10-31 LAB — CMP (CANCER CENTER ONLY)
ALT: 39 U/L (ref 0–44)
AST: 28 U/L (ref 15–41)
Albumin: 4.1 g/dL (ref 3.5–5.0)
Alkaline Phosphatase: 67 U/L (ref 38–126)
Anion gap: 7 (ref 5–15)
BUN: 20 mg/dL (ref 6–20)
CO2: 28 mmol/L (ref 22–32)
Calcium: 9 mg/dL (ref 8.9–10.3)
Chloride: 101 mmol/L (ref 98–111)
Creatinine: 0.76 mg/dL (ref 0.44–1.00)
GFR, Estimated: >60 mL/min (ref >60)
Glucose, Bld: 104 mg/dL — ABNORMAL HIGH (ref 70–99)
Potassium: 4.2 mmol/L (ref 3.5–5.1)
Sodium: 136 mmol/L (ref 135–145)
Total Bilirubin: 0.6 mg/dL (ref 0.3–1.2)
Total Protein: 7.5 g/dL (ref 6.5–8.1)

## 2022-10-31 MED ORDER — SODIUM CHLORIDE 0.9 % IV SOLN
10.0000 mg | Freq: Once | INTRAVENOUS | Status: AC
  Administered 2022-10-31: 10 mg via INTRAVENOUS
  Filled 2022-10-31: qty 10

## 2022-10-31 MED ORDER — HEPARIN SOD (PORK) LOCK FLUSH 100 UNIT/ML IV SOLN
500.0000 [IU] | Freq: Once | INTRAVENOUS | Status: AC | PRN
  Administered 2022-10-31: 500 [IU]

## 2022-10-31 MED ORDER — FAMOTIDINE IN NACL 20-0.9 MG/50ML-% IV SOLN
20.0000 mg | Freq: Once | INTRAVENOUS | Status: AC
  Administered 2022-10-31: 20 mg via INTRAVENOUS
  Filled 2022-10-31: qty 50

## 2022-10-31 MED ORDER — SODIUM CHLORIDE 0.9% FLUSH
10.0000 mL | INTRAVENOUS | Status: DC | PRN
  Administered 2022-10-31: 10 mL

## 2022-10-31 MED ORDER — SODIUM CHLORIDE 0.9% FLUSH
10.0000 mL | Freq: Once | INTRAVENOUS | Status: AC
  Administered 2022-10-31: 10 mL

## 2022-10-31 MED ORDER — SODIUM CHLORIDE 0.9 % IV SOLN
80.0000 mg/m2 | Freq: Once | INTRAVENOUS | Status: AC
  Administered 2022-10-31: 138 mg via INTRAVENOUS
  Filled 2022-10-31: qty 23

## 2022-10-31 MED ORDER — SODIUM CHLORIDE 0.9 % IV SOLN: Freq: Once | INTRAVENOUS | Status: AC

## 2022-10-31 MED ORDER — DIPHENHYDRAMINE HCL 50 MG/ML IJ SOLN
50.0000 mg | Freq: Once | INTRAMUSCULAR | Status: AC
  Administered 2022-10-31: 50 mg via INTRAVENOUS
  Filled 2022-10-31: qty 1

## 2022-10-31 NOTE — Progress Notes (Signed)
[facility] Follow up:    [name], NP-C [place] [address]   DIAGNOSIS:  Cancer Staging  Malignant neoplasm of upper-outer quadrant of right female breast [facility]) Staging form: Breast, AJCC 8th Edition - Clinical: Stage IIB (cT2, cN0, cM0, G3, ER+, PR-, HER2-) - Signed by Benay Pike, MD on 10/08/2022 Stage prefix: Initial diagnosis Histologic grading system: 3 grade system - Clinical: No stage assigned - Unsigned   SUMMARY OF ONCOLOGIC HISTORY: Oncology History  Malignant neoplasm of upper-outer quadrant of right female breast ([place])  09/26/2022 Mammogram   Patient had a palpable right breast mass and hence underwent bilateral diagnostic mammogram.  This confirmed a suspicious 3 cm right upper outer quadrant mass and an abnormal right axillary lymph node with cortical thickening.  Tissue sampling of both the breast mass and axillary lymph node were recommended.  Left breast was normal.   09/26/2022 Breast US   Ultrasound breast confirmed the above-mentioned findings.   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  Prognostic showed ER 50% positive weak staining PR 0% negative HER2 1+ by IHC and Ki-67 of 70%   10/06/2022 Initial Diagnosis   Malignant neoplasm of upper-outer quadrant of right female breast ([place])   10/08/2022 Cancer Staging   Staging form: Breast, AJCC 8th Edition - Clinical: Stage IIB (cT2, cN0, cM0, G3, ER+, PR-, HER2-) - Signed by Benay Pike, MD on 10/08/2022 Stage prefix: Initial diagnosis Histologic grading system: 3 grade system   10/24/2022 -  Chemotherapy   Patient is on Treatment Plan : BREAST Pembrolizumab (200) D1 + Carboplatin (5) D1 + Paclitaxel (80) D1,8,15 q21d X 4 cycles / Pembrolizumab (200) D1 + AC D1 q21d x 4 cycles      Genetic Testing   [facility] Common Cancer Panel+RNA was Negative. Report date is 10/16/2022.  The Common Hereditary  Cancers Panel offered by [facility] includes sequencing and/or deletion duplication testing of the following 48 genes: APC, ATM, AXIN2, BAP1, BARD1, BMPR1A, BRCA1, BRCA2, BRIP1, CDH1, CDK4, CDKN2A (p14ARF and p16INK4a only), CHEK2, CTNNA1, DICER1, EPCAM (Deletion/duplication testing only), FH, GREM1 (promoter region duplication testing only), HOXB13, KIT, MBD4, MEN1, MLH1, MSH2, MSH3, MSH6, MUTYH, NF1, NHTL1, PALB2, PDGFRA, PMS2, POLD1, POLE, PTEN, RAD51C, RAD51D, SDHA (sequencing analysis only except exon 14), SDHB, SDHC, SDHD, SMAD4, SMARCA4. STK11, TP53, TSC1, TSC2, and VHL.     CURRENT THERAPY: Neoadjuvant chemotherapy with pembrolizumab, carboplatin, Taxol  INTERVAL HISTORY: SHIVA EKLOF 52 y.o. female returns for follow-up today prior to receiving Taxol alone.  She is doing moderately well today.  Her ANC today is 1.1.  She tells me that she is stressed out today because her dad was recently diagnosed with cancer and he lives in a different state so she is saddened that she is going through cancer treatment and he will be undergoing cancer treatment and she cannot be there to be with him.  She denies any peripheral neuropathy.  She tells me after her first cycle of treatment she did have some mild nausea but it has since resolved.  Today she did experience some nausea after receiving a saline flush.  She is also experiencing some hot flashes since beginning the chemotherapy but she denies any fever or chills or other concerns.   Patient Active Problem List   Diagnosis Date Noted   Port-A-Cath in place 10/24/2022   Genetic testing 10/16/2022   Malignant neoplasm of upper-outer quadrant of  right female breast ([place]) 10/06/2022   Thrombocytopenia ([place]) 03/05/2022   Leukopenia 03/05/2022   Hot flashes 02/28/2022   Hair thinning 02/28/2022   Amenorrhea 02/28/2022   Screening for colon cancer 02/28/2022   Multiple nevi 02/28/2022   PTSD (post-traumatic stress disorder) 02/08/2021   Mild  episode of recurrent major depressive disorder ([place]) 09/12/2013   Attention deficit disorder 04/25/2013   Migraine 02/05/2013   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  MEDICAL HISTORY: Past Medical History:  Diagnosis Date   Anxiety    Breast cancer ([place])    Depression    Hx of febrile seizure    1980    SURGICAL HISTORY: Past Surgical History:  Procedure Laterality Date   BREAST BIOPSY Right 09/30/2022   [place] RT BREAST BX W LOC DEV 1ST LESION IMG BX SPEC US GUIDE 09/30/2022 [facility] MAMMOGRAPHY   PORTACATH PLACEMENT N/A 10/23/2022   Procedure: INSERTION PORT-A-CATH;  Surgeon: Coralie Keens, MD;  Location: [facility];  Service: General;  Laterality: N/A;    SOCIAL HISTORY: Social History   Socioeconomic History   Marital status: Single    Spouse name: Not on file   Number of children: Not on file   Years of education: Not on file   Highest education level: Not on file  Occupational History   Not on file  Tobacco Use   Smoking status: Never   Smokeless tobacco: Never  Vaping Use   Vaping Use: Never used  Substance and Sexual Activity   Alcohol use: Yes    Comment: social   Drug use: No   Sexual activity: Not Currently    Partners: Male  Other Topics Concern   Not on file  Social History Narrative   Not on file   Social Determinants of Health   Financial Resource Strain: High Risk (10/08/2022)   Overall Financial Resource Strain (CARDIA)    Difficulty of Paying Living Expenses: Hard  Food Insecurity: Food Insecurity Present (10/08/2022)   Hunger Vital Sign    Worried About Running Out of Food in the Last Year: Sometimes true    Ran Out of Food in the Last Year: Sometimes true  Transportation Needs: No Transportation Needs (10/08/2022)   PRAPARE - Hydrologist (Medical):  No    Lack of Transportation (Non-Medical): No  Physical Activity: Not on file  Stress: Not on file  Social Connections: Not on file  Intimate Partner Violence: Not on file    FAMILY HISTORY: Family History  Problem Relation Age of Onset   Breast cancer Sister 62 - 50   Cancer Paternal Grandmother        unknown type    Review of Systems  Constitutional:  Positive for fatigue. Negative for appetite change, chills, fever and unexpected weight change.  HENT:   Negative for hearing loss, lump/mass and trouble swallowing.   Eyes:  Negative for eye problems and icterus.  Respiratory:  Negative for chest tightness, cough and shortness of breath.   Cardiovascular:  Negative for chest pain, leg swelling and palpitations.  Gastrointestinal:  Positive for nausea. Negative for abdominal distention, abdominal pain, constipation, diarrhea and vomiting.  Endocrine: Negative for hot flashes.  Genitourinary:  Negative for difficulty urinating.   Musculoskeletal:  Negative for arthralgias.  Skin:  Negative for itching and rash.  Neurological:  Negative for dizziness, extremity weakness, headaches and numbness.  Hematological:  Negative for adenopathy. Does not bruise/bleed easily.  Psychiatric/Behavioral:  Negative for depression. The patient is not nervous/anxious.       PHYSICAL EXAMINATION  ECOG PERFORMANCE STATUS: 1 - Symptomatic but completely ambulatory  Vitals:   10/31/22 1116  BP: 103/61  Pulse: 69  Resp: 16  Temp: (!) 97.5 F (36.4 C)  SpO2: 100%    Physical Exam Constitutional:      General: She is not in acute distress.    Appearance: Normal appearance. She is not toxic-appearing.  HENT:     Head: Normocephalic and atraumatic.  Eyes:     General: No scleral icterus. Cardiovascular:     Rate and Rhythm: Normal rate and regular rhythm.     Pulses: Normal pulses.     Heart sounds: Normal heart sounds.  Pulmonary:     Effort: Pulmonary effort is normal.     Breath  sounds: Normal breath sounds.  Abdominal:     General: Abdomen is flat. Bowel sounds are normal. There is no distension.     Palpations: Abdomen is soft.     Tenderness: There is no abdominal tenderness.  Musculoskeletal:        General: No swelling.     Cervical back: Neck supple.  Lymphadenopathy:     Cervical: No cervical adenopathy.  Skin:    General: Skin is warm and dry.     Findings: No rash.  Neurological:     General: No focal deficit present.     Mental Status: She is alert.  Psychiatric:        Mood and Affect: Mood normal.        Behavior: Behavior normal.     LABORATORY DATA:  CBC    Component Value Date/Time   WBC 2.1 (L) 10/31/2022 1037   WBC 2.9 (L) 02/28/2022 1049   RBC 3.88 10/31/2022 1037   HGB 11.4 (L) 10/31/2022 1037   HCT 33.5 (L) 10/31/2022 1037   PLT 112 (L) 10/31/2022 1037   MCV 86.3 10/31/2022 1037   MCH 29.4 10/31/2022 1037   MCHC 34.0 10/31/2022 1037   RDW 11.6 10/31/2022 1037   LYMPHSABS 0.7 10/31/2022 1037   MONOABS 0.2 10/31/2022 1037   EOSABS 0.1 10/31/2022 1037   BASOSABS 0.0 10/31/2022 1037    CMP     Component Value Date/Time   NA 136 10/31/2022 1037   K 4.2 10/31/2022 1037   CL 101 10/31/2022 1037   CO2 28 10/31/2022 1037   GLUCOSE 104 (H) 10/31/2022 1037   BUN 20 10/31/2022 1037   CREATININE 0.76 10/31/2022 1037   CALCIUM 9.0 10/31/2022 1037   PROT 7.5 10/31/2022 1037   ALBUMIN 4.1 10/31/2022 1037   AST 28 10/31/2022 1037   ALT 39 10/31/2022 1037   ALKPHOS 67 10/31/2022 1037   BILITOT 0.6 10/31/2022 1037   GFRNONAA >60 10/31/2022 1037   GFRAA >60 12/23/2015 1151     ASSESSMENT and THERAPY PLAN:   Malignant neoplasm of upper-outer quadrant of right female breast ([place]) Maria Carter is a 52 year old woman with stage IIb ER positive right-sided breast cancer here today for evaluation prior to beginning neoadjuvant chemotherapy.  Mat[place]ah tolerated the paclitaxel, carboplatin, pembrolizumab well from last week.  She did  receive the carboplatin with AUC of 5 and today her ANC is 1.1.  I reviewed that with Dr. Chryl Heck.  Will proceed with treatment and she will not receive any Granix  or growth factor injection afterwards.  Since she is receiving Taxol alone we will monitor it as she will be due for Taxol alone in 1 week followed by the growth factor injections.  With her subsequent cycles for Taxol [place] and [place] the [place] will be dosed on a weekly basis at AUC of 2.  This will start with cycle 2-day 1 of therapy.  Analilia will proceed with treatment today so long as her c-Met is within parameters.  I reviewed this with her in detail and she verbalizes understanding of this.  We will see her back in 1 week for labs, follow-up, and her next appointment.   All questions were answered. The patient knows to call the clinic with any problems, questions or concerns. We can certainly see the patient much sooner if necessary.  Total encounter time:30 minutes*in face-to-face visit time, chart review, lab review, care coordination, order entry, and documentation of the encounter time.    Wilber Bihari, NP 10/31/22 1:54 PM Medical Oncology and Hematology [facility] [place], [place] [address] Tel. [phone]    Fax. [phone]  *Total Encounter Time as defined by the Centers for Medicare and Medicaid Services includes, in addition to the face-to-face time of a patient visit (documented in the note above) non-face-to-face time: obtaining and reviewing outside history, ordering and reviewing medications, tests or procedures, care coordination (communications with other health care professionals or caregivers) and documentation in the medical record.

## 2022-10-31 NOTE — Assessment & Plan Note (Addendum)
Maria Carter is a 52 year old woman with stage IIb ER positive right-sided breast cancer here today for evaluation prior to beginning neoadjuvant chemotherapy.  Maria Carter tolerated the paclitaxel, carboplatin, pembrolizumab well from last week.  She did receive the carboplatin with AUC of 5 and today her ANC is 1.1.  I reviewed that with Dr. Chryl Heck.  Will proceed with treatment and she will not receive any Granix or growth factor injection afterwards.  Since she is receiving Taxol alone we will monitor it as she will be due for Taxol alone in 1 week followed by the growth factor injections.  With her subsequent cycles for Taxol [place] and [place] the [place] will be dosed on a weekly basis at AUC of 2.  This will start with cycle 2-day 1 of therapy.  Maria Carter will proceed with treatment today so long as her c-Met is within parameters.  I reviewed this with her in detail and she verbalizes understanding of this.  We will see her back in 1 week for labs, follow-up, and her next appointment.

## 2022-10-31 NOTE — Progress Notes (Signed)
Per Maria Bihari, NP - okay to proceed with treatment with ANC of 1.1.

## 2022-10-31 NOTE — Patient Instructions (Signed)
[facility]   Discharge Instructions: Thank you for choosing [facility] to provide your oncology and hematology care.   If you have a lab appointment with the [facility], please go directly to the [facility] and check in at the registration area.   Wear comfortable clothing and clothing appropriate for easy access to any Portacath or PICC line.   We strive to give you quality time with your provider. You may need to reschedule your appointment if you arrive late (15 or more minutes).  Arriving late affects you and other patients whose appointments are after yours.  Also, if you miss three or more appointments without notifying the office, you may be dismissed from the clinic at the provider's discretion.      For prescription refill requests, have your pharmacy contact our office and allow 72 hours for refills to be completed.    Today you received the following chemotherapy and/or immunotherapy agents: Paclitaxel (Taxol)      To help prevent nausea and vomiting after your treatment, we encourage you to take your nausea medication as directed.  BELOW ARE SYMPTOMS THAT SHOULD BE REPORTED IMMEDIATELY: *FEVER GREATER THAN 100.4 F (38 C) OR HIGHER *CHILLS OR SWEATING *NAUSEA AND VOMITING THAT IS NOT CONTROLLED WITH YOUR NAUSEA MEDICATION *UNUSUAL SHORTNESS OF BREATH *UNUSUAL BRUISING OR BLEEDING *URINARY PROBLEMS (pain or burning when urinating, or frequent urination) *BOWEL PROBLEMS (unusual diarrhea, constipation, pain near the anus) TENDERNESS IN MOUTH AND THROAT WITH OR WITHOUT PRESENCE OF ULCERS (sore throat, sores in mouth, or a toothache) UNUSUAL RASH, SWELLING OR PAIN  UNUSUAL VAGINAL DISCHARGE OR ITCHING   Items with * indicate a potential emergency and should be followed up as soon as possible or go to the Emergency Department if any problems should occur.  Please show the CHEMOTHERAPY ALERT CARD or IMMUNOTHERAPY ALERT  CARD at check-in to the Emergency Department and triage nurse.  Should you have questions after your visit or need to cancel or reschedule your appointment, please contact [facility]  Dept: [phone]  and follow the prompts.  Office hours are 8:00 a.m. to 4:30 p.m. Monday - Friday. Please note that voicemails left after 4:00 p.m. may not be returned until the following business day.  We are closed weekends and major holidays. You have access to a nurse at all times for urgent questions. Please call the main number to the clinic Dept: [phone] and follow the prompts.   For any non-urgent questions, you may also contact your provider using MyChart. We now offer e-Visits for anyone 73 and older to request care online for non-urgent symptoms. For details visit [email or web address].   Also download the MyChart app! Go to the app store, search "MyChart", open the app, select [facility], and log in with your MyChart username and password.

## 2022-10-31 NOTE — Progress Notes (Signed)
NP Ria Comment would like for carboplatin AUC of 2 starting with cycle 2.    Larene Beach, PharmD

## 2022-11-06 MED FILL — Dexamethasone Sodium Phosphate Inj 100 MG/10ML: INTRAMUSCULAR | Qty: 1 | Status: AC

## 2022-11-07 ENCOUNTER — Encounter: Payer: Self-pay | Admitting: Hematology and Oncology

## 2022-11-07 ENCOUNTER — Inpatient Hospital Stay: Payer: Medicare HMO

## 2022-11-07 ENCOUNTER — Inpatient Hospital Stay (HOSPITAL_BASED_OUTPATIENT_CLINIC_OR_DEPARTMENT_OTHER): Payer: Medicare HMO | Admitting: Adult Health

## 2022-11-07 VITALS — BP 124/56 | HR 76 | Temp 98.1°F | Resp 16 | Ht 61.0 in | Wt 163.0 lb

## 2022-11-07 DIAGNOSIS — C50411 Malignant neoplasm of upper-outer quadrant of right female breast: Secondary | ICD-10-CM

## 2022-11-07 DIAGNOSIS — Z803 Family history of malignant neoplasm of breast: Secondary | ICD-10-CM | POA: Diagnosis not present

## 2022-11-07 DIAGNOSIS — Z170 Estrogen receptor positive status [ER+]: Secondary | ICD-10-CM | POA: Diagnosis not present

## 2022-11-07 DIAGNOSIS — Z95828 Presence of other vascular implants and grafts: Secondary | ICD-10-CM

## 2022-11-07 DIAGNOSIS — Z79899 Other long term (current) drug therapy: Secondary | ICD-10-CM | POA: Diagnosis not present

## 2022-11-07 DIAGNOSIS — Z5111 Encounter for antineoplastic chemotherapy: Secondary | ICD-10-CM | POA: Diagnosis not present

## 2022-11-07 LAB — CMP (CANCER CENTER ONLY)
ALT: 51 U/L — ABNORMAL HIGH (ref 0–44)
AST: 36 U/L (ref 15–41)
Albumin: 3.9 g/dL (ref 3.5–5.0)
Alkaline Phosphatase: 71 U/L (ref 38–126)
Anion gap: 4 — ABNORMAL LOW (ref 5–15)
BUN: 12 mg/dL (ref 6–20)
CO2: 32 mmol/L (ref 22–32)
Calcium: 9.1 mg/dL (ref 8.9–10.3)
Chloride: 104 mmol/L (ref 98–111)
Creatinine: 0.67 mg/dL (ref 0.44–1.00)
GFR, Estimated: >60 mL/min (ref >60)
Glucose, Bld: 109 mg/dL — ABNORMAL HIGH (ref 70–99)
Potassium: 3.7 mmol/L (ref 3.5–5.1)
Sodium: 140 mmol/L (ref 135–145)
Total Bilirubin: 0.2 mg/dL — ABNORMAL LOW (ref 0.3–1.2)
Total Protein: 6.9 g/dL (ref 6.5–8.1)

## 2022-11-07 LAB — CBC WITH DIFFERENTIAL (CANCER CENTER ONLY)
Abs Immature Granulocytes: 0.01 10*3/uL (ref 0.00–0.07)
Basophils Absolute: 0 10*3/uL (ref 0.0–0.1)
Basophils Relative: 1 %
Eosinophils Absolute: 0 10*3/uL (ref 0.0–0.5)
Eosinophils Relative: 1 %
HCT: 31.4 % — ABNORMAL LOW (ref 36.0–46.0)
Hemoglobin: 10.4 g/dL — ABNORMAL LOW (ref 12.0–15.0)
Immature Granulocytes: 1 %
Lymphocytes Relative: 39 %
Lymphs Abs: 0.8 10*3/uL (ref 0.7–4.0)
MCH: 28.8 pg (ref 26.0–34.0)
MCHC: 33.1 g/dL (ref 30.0–36.0)
MCV: 87 fL (ref 80.0–100.0)
Monocytes Absolute: 0.2 10*3/uL (ref 0.1–1.0)
Monocytes Relative: 11 %
Neutro Abs: 1 10*3/uL — ABNORMAL LOW (ref 1.7–7.7)
Neutrophils Relative %: 47 %
Platelet Count: 152 10*3/uL (ref 150–400)
RBC: 3.61 MIL/uL — ABNORMAL LOW (ref 3.87–5.11)
RDW: 12.1 % (ref 11.5–15.5)
WBC Count: 2.1 10*3/uL — ABNORMAL LOW (ref 4.0–10.5)
nRBC: 0 % (ref 0.0–0.2)

## 2022-11-07 MED ORDER — HEPARIN SOD (PORK) LOCK FLUSH 100 UNIT/ML IV SOLN
500.0000 [IU] | Freq: Once | INTRAVENOUS | Status: AC | PRN
  Administered 2022-11-07: 500 [IU]

## 2022-11-07 MED ORDER — SODIUM CHLORIDE 0.9 % IV SOLN: Freq: Once | INTRAVENOUS | Status: AC

## 2022-11-07 MED ORDER — SODIUM CHLORIDE 0.9 % IV SOLN
10.0000 mg | Freq: Once | INTRAVENOUS | Status: AC
  Administered 2022-11-07: 10 mg via INTRAVENOUS
  Filled 2022-11-07: qty 10

## 2022-11-07 MED ORDER — FAMOTIDINE IN NACL 20-0.9 MG/50ML-% IV SOLN
20.0000 mg | Freq: Once | INTRAVENOUS | Status: AC
  Administered 2022-11-07: 20 mg via INTRAVENOUS
  Filled 2022-11-07: qty 50

## 2022-11-07 MED ORDER — DIPHENHYDRAMINE HCL 50 MG/ML IJ SOLN
50.0000 mg | Freq: Once | INTRAMUSCULAR | Status: AC
  Administered 2022-11-07: 50 mg via INTRAVENOUS
  Filled 2022-11-07: qty 1

## 2022-11-07 MED ORDER — SODIUM CHLORIDE 0.9% FLUSH
10.0000 mL | INTRAVENOUS | Status: DC | PRN
  Administered 2022-11-07: 10 mL

## 2022-11-07 MED ORDER — SODIUM CHLORIDE 0.9% FLUSH
10.0000 mL | Freq: Once | INTRAVENOUS | Status: AC
  Administered 2022-11-07: 10 mL

## 2022-11-07 MED ORDER — SODIUM CHLORIDE 0.9 % IV SOLN
80.0000 mg/m2 | Freq: Once | INTRAVENOUS | Status: AC
  Administered 2022-11-07: 138 mg via INTRAVENOUS
  Filled 2022-11-07: qty 23

## 2022-11-07 NOTE — Progress Notes (Signed)
[facility] Follow up:    Maria Rm, NP-C [place] [address]   DIAGNOSIS:  Cancer Staging  Malignant neoplasm of upper-outer quadrant of right female breast [facility]) Staging form: Breast, AJCC 8th Edition - Clinical: Stage IIB (cT2, cN0, cM0, G3, ER+, PR-, HER2-) - Signed by Benay Pike, MD on 10/08/2022 Stage prefix: Initial diagnosis Histologic grading system: 3 grade system - Clinical: No stage assigned - Unsigned   SUMMARY OF ONCOLOGIC HISTORY: Oncology History  Malignant neoplasm of upper-outer quadrant of right female breast ([place])  09/26/2022 Mammogram   Patient had a palpable right breast mass and hence underwent bilateral diagnostic mammogram.  This confirmed a suspicious 3 cm right upper outer quadrant mass and an abnormal right axillary lymph node with cortical thickening.  Tissue sampling of both the breast mass and axillary lymph node were recommended.  Left breast was normal.   09/26/2022 Breast US   Ultrasound breast confirmed the above-mentioned findings.   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  Prognostic showed ER 50% positive weak staining PR 0% negative HER2 1+ by IHC and Ki-67 of 70%   10/06/2022 Initial Diagnosis   Malignant neoplasm of upper-outer quadrant of right female breast ([place])   10/08/2022 Cancer Staging   Staging form: Breast, AJCC 8th Edition - Clinical: Stage IIB (cT2, cN0, cM0, G3, ER+, PR-, HER2-) - Signed by Benay Pike, MD on 10/08/2022 Stage prefix: Initial diagnosis Histologic grading system: 3 grade system   10/24/2022 -  Chemotherapy   Patient is on Treatment Plan : BREAST Pembrolizumab (200) D1 + Carboplatin (5) D1 + Paclitaxel (80) D1,8,15 q21d X 4 cycles / Pembrolizumab (200) D1 + AC D1 q21d x 4 cycles      Genetic Testing   [facility] Common Cancer Panel+RNA was Negative. Report date is 10/16/2022.  The Common Hereditary  Cancers Panel offered by [facility] includes sequencing and/or deletion duplication testing of the following 48 genes: APC, ATM, AXIN2, BAP1, BARD1, BMPR1A, BRCA1, BRCA2, BRIP1, CDH1, CDK4, CDKN2A (p14ARF and p16INK4a only), CHEK2, CTNNA1, DICER1, EPCAM (Deletion/duplication testing only), FH, GREM1 (promoter region duplication testing only), HOXB13, KIT, MBD4, MEN1, MLH1, MSH2, MSH3, MSH6, MUTYH, NF1, NHTL1, PALB2, PDGFRA, PMS2, POLD1, POLE, PTEN, RAD51C, RAD51D, SDHA (sequencing analysis only except exon 14), SDHB, SDHC, SDHD, SMAD4, SMARCA4. STK11, TP53, TSC1, TSC2, and VHL.     CURRENT THERAPY: Taxol  INTERVAL HISTORY: Maria Carter 52 y.o. female returns for follow-up and evaluation prior to receiving chemotherapy today.  She tolerated last week's chemotherapy well.  She denies any significant issues including nausea vomiting or diarrhea.  She was slightly constipated but that has resolved.   Patient Active Problem List   Diagnosis Date Noted   Port-A-Cath in place 10/24/2022   Genetic testing 10/16/2022   Malignant neoplasm of upper-outer quadrant of right female breast ([place]) 10/06/2022   Thrombocytopenia ([place]) 03/05/2022   Leukopenia 03/05/2022   Hot flashes 02/28/2022   Hair thinning 02/28/2022   Amenorrhea 02/28/2022   Screening for colon cancer 02/28/2022   Multiple nevi 02/28/2022   PTSD (post-traumatic stress disorder) 02/08/2021   Mild episode of recurrent major depressive disorder ([place]) 09/12/2013   Attention deficit disorder 04/25/2013   Migraine 02/05/2013   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  MEDICAL HISTORY: Past Medical History:  Diagnosis Date   Anxiety    Breast cancer ([place])    Depression    Hx of febrile seizure    1980    SURGICAL HISTORY: Past Surgical History:  Procedure Laterality Date    BREAST BIOPSY Right 09/30/2022   [place] RT BREAST BX W LOC DEV 1ST LESION IMG BX SPEC US GUIDE 09/30/2022 [facility] MAMMOGRAPHY   PORTACATH PLACEMENT N/A 10/23/2022   Procedure: INSERTION PORT-A-CATH;  Surgeon: Coralie Keens, MD;  Location: [facility];  Service: General;  Laterality: N/A;    SOCIAL HISTORY: Social History   Socioeconomic History   Marital status: Single    Spouse name: Not on file   Number of children: Not on file   Years of education: Not on file   Highest education level: Not on file  Occupational History   Not on file  Tobacco Use   Smoking status: Never   Smokeless tobacco: Never  Vaping Use   Vaping Use: Never used  Substance and Sexual Activity   Alcohol use: Yes    Comment: social   Drug use: No   Sexual activity: Not Currently    Partners: Male  Other Topics Concern   Not on file  Social History Narrative   Not on file   Social Determinants of Health   Financial Resource Strain: High Risk (10/08/2022)   Overall Financial Resource Strain (CARDIA)    Difficulty of Paying Living Expenses: Hard  Food Insecurity: Food Insecurity Present (10/08/2022)   Hunger Vital Sign    Worried About Running Out of Food in the Last Year: Sometimes true    Ran Out of Food in the Last Year: Sometimes true  Transportation Needs: No Transportation Needs (10/08/2022)   PRAPARE - Hydrologist (Medical): No    Lack of Transportation (Non-Medical): No  Physical Activity: Not on file  Stress: Not on file  Social Connections: Not on file  Intimate Partner Violence: Not on file    FAMILY HISTORY: Family History  Problem Relation Age of Onset   Breast cancer Sister 56 - 27   Cancer Paternal Grandmother        unknown type    Review of Systems  Constitutional:  Negative for appetite change, chills, fatigue, fever and unexpected weight change.  HENT:   Negative for hearing loss, lump/mass and trouble swallowing.   Eyes:  Negative  for eye problems and icterus.  Respiratory:  Negative for chest tightness, cough and shortness of breath.   Cardiovascular:  Negative for chest pain, leg swelling and palpitations.  Gastrointestinal:  Positive for constipation. Negative for abdominal distention, abdominal pain, diarrhea, nausea and vomiting.  Endocrine: Negative for hot flashes.  Genitourinary:  Negative for difficulty urinating.   Musculoskeletal:  Negative for arthralgias.  Skin:  Negative for itching and rash.  Neurological:  Negative for dizziness, extremity weakness, headaches and numbness.  Hematological:  Negative for adenopathy. Does not bruise/bleed easily.  Psychiatric/Behavioral:  Negative for depression. The patient is not nervous/anxious.       PHYSICAL EXAMINATION  ECOG PERFORMANCE STATUS: 1 - Symptomatic but completely ambulatory  Vitals:   11/07/22 1218  BP: (!) 124/56  Pulse: 76  Resp: 16  Temp: 98.1 F (36.7 C)  SpO2: 99%    Physical Exam Constitutional:      General: She is not in acute distress.    Appearance: Normal appearance. She is not toxic-appearing.  HENT:  Head: Normocephalic and atraumatic.  Eyes:     General: No scleral icterus. Cardiovascular:     Rate and Rhythm: Normal rate and regular rhythm.     Pulses: Normal pulses.     Heart sounds: Normal heart sounds.  Pulmonary:     Effort: Pulmonary effort is normal.     Breath sounds: Normal breath sounds.  Abdominal:     General: Abdomen is flat. Bowel sounds are normal. There is no distension.     Palpations: Abdomen is soft.     Tenderness: There is no abdominal tenderness.  Musculoskeletal:        General: No swelling.     Cervical back: Neck supple.  Lymphadenopathy:     Cervical: No cervical adenopathy.  Skin:    General: Skin is warm and dry.     Findings: No rash.  Neurological:     General: No focal deficit present.     Mental Status: She is alert.  Psychiatric:        Mood and Affect: Mood normal.         Behavior: Behavior normal.     LABORATORY DATA:  CBC    Component Value Date/Time   WBC 2.1 (L) 11/07/2022 1158   WBC 2.9 (L) 02/28/2022 1049   RBC 3.61 (L) 11/07/2022 1158   HGB 10.4 (L) 11/07/2022 1158   HCT 31.4 (L) 11/07/2022 1158   PLT 152 11/07/2022 1158   MCV 87.0 11/07/2022 1158   MCH 28.8 11/07/2022 1158   MCHC 33.1 11/07/2022 1158   RDW 12.1 11/07/2022 1158   LYMPHSABS 0.8 11/07/2022 1158   MONOABS 0.2 11/07/2022 1158   EOSABS 0.0 11/07/2022 1158   BASOSABS 0.0 11/07/2022 1158    CMP     Component Value Date/Time   NA 136 10/31/2022 1037   K 4.2 10/31/2022 1037   CL 101 10/31/2022 1037   CO2 28 10/31/2022 1037   GLUCOSE 104 (H) 10/31/2022 1037   BUN 20 10/31/2022 1037   CREATININE 0.76 10/31/2022 1037   CALCIUM 9.0 10/31/2022 1037   PROT 7.5 10/31/2022 1037   ALBUMIN 4.1 10/31/2022 1037   AST 28 10/31/2022 1037   ALT 39 10/31/2022 1037   ALKPHOS 67 10/31/2022 1037   BILITOT 0.6 10/31/2022 1037   GFRNONAA >60 10/31/2022 1037   GFRAA >60 12/23/2015 1151        ASSESSMENT and THERAPY PLAN:   Malignant neoplasm of upper-outer quadrant of right female breast ([place]) Garnett is a 52 year old woman with stage IIb ER positive right-sided breast cancer here today for evaluation prior to receiving her neoadjuvant chemotherapy.  Marinell is due for Taxol alone today.  She will proceed with treatment.  I reviewed her CBC with her in detail which demonstrates white blood cells of 2.1 and ANC of 1.0.  She will proceed with treatment with these labs so long as her c-Met is within parameters.  She is going to receive growth factor injections Saturday, Monday, and Tuesday.  We will see Anele back next week for labs, follow-up, and her next cycle of chemotherapy.   All questions were answered. The patient knows to call the clinic with any problems, questions or concerns. We can certainly see the patient much sooner if necessary.  Total encounter time:20 minutes*in  face-to-face visit time, chart review, lab review, care coordination, order entry, and documentation of the encounter time.    Wilber Bihari, NP 11/07/22 12:36 PM Medical Oncology and Hematology [address], [place] [address] Tel. [phone]    Fax. [phone]  *Total Encounter Time as defined by the Centers for Medicare and Medicaid Services includes, in addition to the face-to-face time of a patient visit (documented in the note above) non-face-to-face time: obtaining and reviewing outside history, ordering and reviewing medications, tests or procedures, care coordination (communications with other health care professionals or caregivers) and documentation in the medical record.

## 2022-11-07 NOTE — Patient Instructions (Addendum)
[facility]  Discharge Instructions: Thank you for choosing [facility] to provide your oncology and hematology care.   If you have a lab appointment with the Cancer Center, please go directly to the Cancer Center and check in at the registration area.   Wear comfortable clothing and clothing appropriate for easy access to any Portacath or PICC line.   We strive to give you quality time with your provider. You may need to reschedule your appointment if you arrive late (15 or more minutes).  Arriving late affects you and other patients whose appointments are after yours.  Also, if you miss three or more appointments without notifying the office, you may be dismissed from the clinic at the provider's discretion.      For prescription refill requests, have your pharmacy contact our office and allow 72 hours for refills to be completed.    Today you received the following chemotherapy and/or immunotherapy agents Paclitaxel      To help prevent nausea and vomiting after your treatment, we encourage you to take your nausea medication as directed.  BELOW ARE SYMPTOMS THAT SHOULD BE REPORTED IMMEDIATELY: *FEVER GREATER THAN 100.4 F (38 C) OR HIGHER *CHILLS OR SWEATING *NAUSEA AND VOMITING THAT IS NOT CONTROLLED WITH YOUR NAUSEA MEDICATION *UNUSUAL SHORTNESS OF BREATH *UNUSUAL BRUISING OR BLEEDING *URINARY PROBLEMS (pain or burning when urinating, or frequent urination) *BOWEL PROBLEMS (unusual diarrhea, constipation, pain near the anus) TENDERNESS IN MOUTH AND THROAT WITH OR WITHOUT PRESENCE OF ULCERS (sore throat, sores in mouth, or a toothache) UNUSUAL RASH, SWELLING OR PAIN  UNUSUAL VAGINAL DISCHARGE OR ITCHING   Items with * indicate a potential emergency and should be followed up as soon as possible or go to the Emergency Department if any problems should occur.  Please show the CHEMOTHERAPY ALERT CARD or IMMUNOTHERAPY ALERT CARD at  check-in to the Emergency Department and triage nurse.  Should you have questions after your visit or need to cancel or reschedule your appointment, please contact [facility]  Dept: [phone]  and follow the prompts.  Office hours are 8:00 a.m. to 4:30 p.m. Monday - Friday. Please note that voicemails left after 4:00 p.m. may not be returned until the following business day.  We are closed weekends and major holidays. You have access to a nurse at all times for urgent questions. Please call the main number to the clinic Dept: [phone] and follow the prompts.   For any non-urgent questions, you may also contact your provider using MyChart. We now offer e-Visits for anyone 18 and older to request care online for non-urgent symptoms. For details visit [email or web address].   Also download the MyChart app! Go to the app store, search "MyChart", open the app, select [facility], and log in with your MyChart username and password.   

## 2022-11-07 NOTE — Assessment & Plan Note (Addendum)
Maria Carter is a 52 year old woman with stage IIb ER positive right-sided breast cancer here today for evaluation prior to receiving her neoadjuvant chemotherapy.  Maria Carter is due for Taxol alone today.  She will proceed with treatment.  I reviewed her CBC with her in detail which demonstrates white blood cells of 2.1 and ANC of 1.0.  She will proceed with treatment with these labs so long as her c-Met is within parameters.  She is going to receive growth factor injections Saturday, Monday, and Tuesday.  We will see Maria Carter back next week for labs, follow-up, and her next cycle of chemotherapy.

## 2022-11-07 NOTE — Progress Notes (Signed)
Received call from patient regarding food bag. Staff message sent to [place] in social work.  Introduced myself as Arboriculturist and to offer available resources. Discussed one-time $1000 Radio broadcast assistant to assist with personal expenses while going through treatment. Advised what is needed to apply and she states she can email to me. Advised before she leaves to contact me and she will complete her paperwork at registration and then we will discuss grant expenses and how they are covered. She was very Patent attorney.  She has my card to do so and for any additional financial questions or concerns.

## 2022-11-07 NOTE — Progress Notes (Signed)
Patient provided proof documentation for [employer].  Patient approved for one-time $1000 grant to assist with personal expenses while going through treatment. She has a copy of the approval letter and expense sheet along with the Outpatient pharmacy information. She received a gift card today from her grant. Explained in detail expenses and how they are covered while she had the paperwork present.  She has my card for any additional financial questions or concerns.

## 2022-11-08 ENCOUNTER — Inpatient Hospital Stay: Payer: Medicare HMO

## 2022-11-08 VITALS — BP 125/78 | HR 77 | Temp 97.8°F | Resp 16

## 2022-11-08 DIAGNOSIS — Z79899 Other long term (current) drug therapy: Secondary | ICD-10-CM | POA: Diagnosis not present

## 2022-11-08 DIAGNOSIS — Z170 Estrogen receptor positive status [ER+]: Secondary | ICD-10-CM | POA: Diagnosis not present

## 2022-11-08 DIAGNOSIS — Z803 Family history of malignant neoplasm of breast: Secondary | ICD-10-CM | POA: Diagnosis not present

## 2022-11-08 DIAGNOSIS — C50411 Malignant neoplasm of upper-outer quadrant of right female breast: Secondary | ICD-10-CM

## 2022-11-08 DIAGNOSIS — Z5111 Encounter for antineoplastic chemotherapy: Secondary | ICD-10-CM | POA: Diagnosis not present

## 2022-11-08 MED ORDER — FILGRASTIM-SNDZ 300 MCG/0.5ML IJ SOSY
300.0000 ug | PREFILLED_SYRINGE | Freq: Once | INTRAMUSCULAR | Status: AC
  Administered 2022-11-08: 300 ug via SUBCUTANEOUS

## 2022-11-10 ENCOUNTER — Inpatient Hospital Stay: Payer: Medicare HMO

## 2022-11-10 ENCOUNTER — Encounter: Payer: Self-pay | Admitting: Hematology and Oncology

## 2022-11-10 VITALS — BP 129/78 | HR 74 | Temp 97.4°F | Resp 16

## 2022-11-10 DIAGNOSIS — C50411 Malignant neoplasm of upper-outer quadrant of right female breast: Secondary | ICD-10-CM

## 2022-11-10 DIAGNOSIS — Z5111 Encounter for antineoplastic chemotherapy: Secondary | ICD-10-CM | POA: Diagnosis not present

## 2022-11-10 DIAGNOSIS — Z79899 Other long term (current) drug therapy: Secondary | ICD-10-CM | POA: Diagnosis not present

## 2022-11-10 DIAGNOSIS — Z170 Estrogen receptor positive status [ER+]: Secondary | ICD-10-CM | POA: Diagnosis not present

## 2022-11-10 DIAGNOSIS — Z803 Family history of malignant neoplasm of breast: Secondary | ICD-10-CM | POA: Diagnosis not present

## 2022-11-10 MED ORDER — FILGRASTIM-SNDZ 300 MCG/0.5ML IJ SOSY
300.0000 ug | PREFILLED_SYRINGE | Freq: Once | INTRAMUSCULAR | Status: AC
  Administered 2022-11-10: 300 ug via SUBCUTANEOUS
  Filled 2022-11-10: qty 0.5

## 2022-11-10 NOTE — Progress Notes (Signed)
Patient emailed regarding utilizing [employer] funds for copay and past due medical bills.  Called patient and rediscussed the grant expense sheet and uses for the [employer]. Vicenta Dunning in social work assisted her with some applications regarding foundation assistance that may help with medical bills. She states she would reach out to [place].  She has my card for any additional financial questions or concerns.

## 2022-11-11 ENCOUNTER — Inpatient Hospital Stay: Payer: Medicare HMO

## 2022-11-11 VITALS — BP 107/64 | HR 75 | Temp 98.9°F | Resp 16

## 2022-11-11 DIAGNOSIS — Z170 Estrogen receptor positive status [ER+]: Secondary | ICD-10-CM | POA: Diagnosis not present

## 2022-11-11 DIAGNOSIS — Z79899 Other long term (current) drug therapy: Secondary | ICD-10-CM | POA: Diagnosis not present

## 2022-11-11 DIAGNOSIS — C50411 Malignant neoplasm of upper-outer quadrant of right female breast: Secondary | ICD-10-CM | POA: Diagnosis not present

## 2022-11-11 DIAGNOSIS — Z5111 Encounter for antineoplastic chemotherapy: Secondary | ICD-10-CM | POA: Diagnosis not present

## 2022-11-11 DIAGNOSIS — Z803 Family history of malignant neoplasm of breast: Secondary | ICD-10-CM | POA: Diagnosis not present

## 2022-11-11 MED ORDER — FILGRASTIM-SNDZ 300 MCG/0.5ML IJ SOSY
300.0000 ug | PREFILLED_SYRINGE | Freq: Once | INTRAMUSCULAR | Status: AC
  Administered 2022-11-11: 300 ug via SUBCUTANEOUS
  Filled 2022-11-11: qty 0.5

## 2022-11-11 NOTE — Patient Instructions (Signed)
Filgrastim Injection What is this medication? FILGRASTIM (fil GRA stim) lowers the risk of infection in people who are receiving chemotherapy. It works by helping your body make more white blood cells, which protects your body from infection. It may also be used to help people who have been exposed to high doses of radiation. It can be used to help prepare your body before a stem cell transplant. It works by helping your bone marrow make and release stem cells into the blood. This medicine may be used for other purposes; ask your health care provider or pharmacist if you have questions. COMMON BRAND NAME(S): Neupogen, Nivestym, Releuko, Zarxio What should I tell my care team before I take this medication? They need to know if you have any of these conditions: History of blood diseases, such as sickle cell anemia Kidney disease Recent or ongoing radiation An unusual or allergic reaction to filgrastim, pegfilgrastim, latex, rubber, other medications, foods, dyes, or preservatives Pregnant or trying to get pregnant Breast-feeding How should I use this medication? This medication is injected under the skin or into a vein. It is usually given by your care team in a hospital or clinic setting. It may be given at home. If you get this medication at home, you will be taught how to prepare and give it. Use exactly as directed. Take it as directed on the prescription label at the same time every day. Keep taking it unless your care team tells you to stop. It is important that you put your used needles and syringes in a special sharps container. Do not put them in a trash can. If you do not have a sharps container, call your pharmacist or care team to get one. This medication comes with INSTRUCTIONS FOR USE. Ask your pharmacist for directions on how to use this medication. Read the information carefully. Talk to your pharmacist or care team if you have questions. Talk to your care team about the use of this  medication in children. While it may be prescribed for children for selected conditions, precautions do apply. Overdosage: If you think you have taken too much of this medicine contact a poison control center or emergency room at once. NOTE: This medicine is only for you. Do not share this medicine with others. What if I miss a dose? It is important not to miss any doses. Talk to your care team about what to do if you miss a dose. What may interact with this medication? Medications that may cause a release of neutrophils, such as lithium This list may not describe all possible interactions. Give your health care provider a list of all the medicines, herbs, non-prescription drugs, or dietary supplements you use. Also tell them if you smoke, drink alcohol, or use illegal drugs. Some items may interact with your medicine. What should I watch for while using this medication? Your condition will be monitored carefully while you are receiving this medication. You may need bloodwork while taking this medication. Talk to your care team about your risk of cancer. You may be more at risk for certain types of cancer if you take this medication. What side effects may I notice from receiving this medication? Side effects that you should report to your care team as soon as possible: Allergic reactions--skin rash, itching, hives, swelling of the face, lips, tongue, or throat Capillary leak syndrome--stomach or muscle pain, unusual weakness or fatigue, feeling faint or lightheaded, decrease in the amount of urine, swelling of the ankles, hands, or   feet, trouble breathing High white blood cell level--fever, fatigue, trouble breathing, night sweats, change in vision, weight loss Inflammation of the aorta--fever, fatigue, back, chest, or stomach pain, severe headache Kidney injury (glomerulonephritis)--decrease in the amount of urine, red or dark brown urine, foamy or bubbly urine, swelling of the ankles, hands, or  feet Shortness of breath or trouble breathing Spleen injury--pain in upper left stomach or shoulder Unusual bruising or bleeding Side effects that usually do not require medical attention (report to your care team if they continue or are bothersome): Back pain Bone pain Fatigue Fever Headache Nausea This list may not describe all possible side effects. Call your doctor for medical advice about side effects. You may report side effects to FDA at 1-800-FDA-1088. Where should I keep my medication? Keep out of the reach of children and pets. Keep this medication in the original packaging until you are ready to take it. Protect from light. See product for storage information. Each product may have different instructions. Get rid of any unused medication after the expiration date. To get rid of medications that are no longer needed or have expired: Take the medication to a medications take-back program. Check with your pharmacy or law enforcement to find a location. If you cannot return the medication, ask your pharmacist or care team how to get rid of this medication safely. NOTE: This sheet is a summary. It may not cover all possible information. If you have questions about this medicine, talk to your doctor, pharmacist, or health care provider.  2023 Elsevier/Gold Standard (2021-11-26 00:00:00)  

## 2022-11-12 NOTE — Progress Notes (Signed)
[facility] Follow up:    Maria Rm, NP-C [place] [address]   DIAGNOSIS:  Cancer Staging  Malignant neoplasm of upper-outer quadrant of right female breast [facility]) Staging form: Breast, AJCC 8th Edition - Clinical: Stage IIB (cT2, cN0, cM0, G3, ER+, PR-, HER2-) - Signed by Maria Pike, MD on 10/08/2022 Stage prefix: Initial diagnosis Histologic grading system: 3 grade system - Clinical: No stage assigned - Unsigned   SUMMARY OF ONCOLOGIC HISTORY: Oncology History  Malignant neoplasm of upper-outer quadrant of right female breast ([place])  09/26/2022 Mammogram   Patient had a palpable right breast mass and hence underwent bilateral diagnostic mammogram.  This confirmed a suspicious 3 cm right upper outer quadrant mass and an abnormal right axillary lymph node with cortical thickening.  Tissue sampling of both the breast mass and axillary lymph node were recommended.  Left breast was normal.   09/26/2022 Breast US   Ultrasound breast confirmed the above-mentioned findings.   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  Prognostic showed ER 50% positive weak staining PR 0% negative HER2 1+ by IHC and Ki-67 of 70%   10/06/2022 Initial Diagnosis   Malignant neoplasm of upper-outer quadrant of right female breast ([place])   10/08/2022 Cancer Staging   Staging form: Breast, AJCC 8th Edition - Clinical: Stage IIB (cT2, cN0, cM0, G3, ER+, PR-, HER2-) - Signed by Maria Pike, MD on 10/08/2022 Stage prefix: Initial diagnosis Histologic grading system: 3 grade system   10/24/2022 -  Chemotherapy   Patient is on Treatment Plan : BREAST Pembrolizumab (200) D1 + Carboplatin (5) D1 + Paclitaxel (80) D1,8,15 q21d X 4 cycles / Pembrolizumab (200) D1 + AC D1 q21d x 4 cycles      Genetic Testing   [facility] Common Cancer Panel+RNA was Negative. Report date is 10/16/2022.  The Common Hereditary  Cancers Panel offered by [facility] includes sequencing and/or deletion duplication testing of the following 48 genes: APC, ATM, AXIN2, BAP1, BARD1, BMPR1A, BRCA1, BRCA2, BRIP1, CDH1, CDK4, CDKN2A (p14ARF and p16INK4a only), CHEK2, CTNNA1, DICER1, EPCAM (Deletion/duplication testing only), FH, GREM1 (promoter region duplication testing only), HOXB13, KIT, MBD4, MEN1, MLH1, MSH2, MSH3, MSH6, MUTYH, NF1, NHTL1, PALB2, PDGFRA, PMS2, POLD1, POLE, PTEN, RAD51C, RAD51D, SDHA (sequencing analysis only except exon 14), SDHB, SDHC, SDHD, SMAD4, SMARCA4. STK11, TP53, TSC1, TSC2, and VHL.     CURRENT THERAPY: Taxol  INTERVAL HISTORY:  Maria Carter 52 y.o. female returns for follow-up and evaluation prior to receiving chemotherapy today.  Cycle 1 day 1 received on 10/24/2022 She says she tolerated it reasonably well except for fatigue. She lost almost her hair last week and she had to cut it short No change in breathing, bowel habits or urinary habits No new neurological complaints. Right breast mass has become less prominent, overall no significant change in size.  Rest of the pertinent 10 point ROS reviewed and negative  Patient Active Problem List   Diagnosis Date Noted   Port-A-Cath in place 10/24/2022   Genetic testing 10/16/2022   Malignant neoplasm of upper-outer quadrant of right female breast ([place]) 10/06/2022   Thrombocytopenia ([place]) 03/05/2022   Leukopenia 03/05/2022   Hot flashes 02/28/2022   Hair thinning 02/28/2022   Amenorrhea 02/28/2022   Screening for colon cancer 02/28/2022   Multiple nevi 02/28/2022   PTSD (post-traumatic stress disorder) 02/08/2021   Mild episode of recurrent major depressive disorder ([place]) 09/12/2013   Attention  deficit disorder 04/25/2013   Migraine 02/05/2013   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  MEDICAL HISTORY: Past Medical History:  Diagnosis Date   Anxiety    Breast cancer ([place])    Depression    Hx of febrile seizure    1980    SURGICAL HISTORY: Past Surgical History:  Procedure Laterality Date   BREAST BIOPSY Right 09/30/2022   [place] RT BREAST BX W LOC DEV 1ST LESION IMG BX SPEC US GUIDE 09/30/2022 [facility] MAMMOGRAPHY   PORTACATH PLACEMENT N/A 10/23/2022   Procedure: INSERTION PORT-A-CATH;  Surgeon: Maria Keens, MD;  Location: [facility];  Service: General;  Laterality: N/A;    SOCIAL HISTORY: Social History   Socioeconomic History   Marital status: Single    Spouse name: Not on file   Number of children: Not on file   Years of education: Not on file   Highest education level: Not on file  Occupational History   Not on file  Tobacco Use   Smoking status: Never   Smokeless tobacco: Never  Vaping Use   Vaping Use: Never used  Substance and Sexual Activity   Alcohol use: Yes    Comment: social   Drug use: No   Sexual activity: Not Currently    Partners: Male  Other Topics Concern   Not on file  Social History Narrative   Not on file   Social Determinants of Health   Financial Resource Strain: High Risk (10/08/2022)   Overall Financial Resource Strain (CARDIA)    Difficulty of Paying Living Expenses: Hard  Food Insecurity: Food Insecurity Present (10/08/2022)   Hunger Vital Sign    Worried About Running Out of Food in the Last Year: Sometimes true    Ran Out of Food in the Last Year: Sometimes true  Transportation Needs: No Transportation Needs (10/08/2022)   PRAPARE - Hydrologist (Medical): No    Lack of Transportation (Non-Medical): No  Physical Activity: Not on file  Stress: Not on file  Social Connections: Not on file  Intimate Partner Violence: Not on file    FAMILY HISTORY: Family History  Problem Relation Age of Onset   Breast cancer Sister 63 - 18   Cancer Paternal Grandmother         unknown type    Review of Systems  Constitutional:  Negative for appetite change, chills, fatigue, fever and unexpected weight change.  HENT:   Negative for hearing loss, lump/mass and trouble swallowing.   Eyes:  Negative for eye problems and icterus.  Respiratory:  Negative for chest tightness, cough and shortness of breath.   Cardiovascular:  Negative for chest pain, leg swelling and palpitations.  Gastrointestinal:  Positive for constipation. Negative for abdominal distention, abdominal pain, diarrhea, nausea and vomiting.  Endocrine: Negative for hot flashes.  Genitourinary:  Negative for difficulty urinating.   Musculoskeletal:  Negative for arthralgias.  Skin:  Negative for itching and rash.  Neurological:  Negative for dizziness, extremity weakness, headaches and numbness.  Hematological:  Negative for adenopathy. Does not bruise/bleed easily.  Psychiatric/Behavioral:  Negative for depression. The patient is not nervous/anxious.       PHYSICAL EXAMINATION  ECOG PERFORMANCE STATUS: 1 - Symptomatic but completely ambulatory  Vitals:   11/14/22 0946  BP: 110/62  Pulse: 73  Resp:  16  Temp: (!) 97.5 F (36.4 C)  SpO2: 99%    Physical Exam Constitutional:      General: She is not in acute distress.    Appearance: Normal appearance. She is not toxic-appearing.  HENT:     Head: Normocephalic and atraumatic.  Eyes:     General: No scleral icterus. Cardiovascular:     Rate and Rhythm: Normal rate and regular rhythm.     Pulses: Normal pulses.     Heart sounds: Normal heart sounds.  Pulmonary:     Effort: Pulmonary effort is normal.     Breath sounds: Normal breath sounds.  Chest:       Comments: Right upper quadrant mass less bulky, may be smaller in one dimension, less prominent. No palpable regional adenopathy Abdominal:     General: Abdomen is flat. Bowel sounds are normal. There is no distension.     Palpations: Abdomen is soft.     Tenderness: There is no  abdominal tenderness.  Musculoskeletal:        General: No swelling.     Cervical back: Neck supple.  Lymphadenopathy:     Cervical: No cervical adenopathy.  Skin:    General: Skin is warm and dry.     Findings: No rash.  Neurological:     General: No focal deficit present.     Mental Status: She is alert.  Psychiatric:        Mood and Affect: Mood normal.        Behavior: Behavior normal.     LABORATORY DATA:  CBC    Component Value Date/Time   WBC 3.3 (L) 11/14/2022 0942   WBC 2.9 (L) 02/28/2022 1049   RBC 3.69 (L) 11/14/2022 0942   HGB 10.7 (L) 11/14/2022 0942   HCT 32.5 (L) 11/14/2022 0942   PLT 119 (L) 11/14/2022 0942   MCV 88.1 11/14/2022 0942   MCH 29.0 11/14/2022 0942   MCHC 32.9 11/14/2022 0942   RDW 12.7 11/14/2022 0942   LYMPHSABS 1.0 11/14/2022 0942   MONOABS 0.5 11/14/2022 0942   EOSABS 0.1 11/14/2022 0942   BASOSABS 0.0 11/14/2022 0942    CMP     Component Value Date/Time   NA 140 11/14/2022 0942   K 4.2 11/14/2022 0942   CL 107 11/14/2022 0942   CO2 30 11/14/2022 0942   GLUCOSE 108 (H) 11/14/2022 0942   BUN 17 11/14/2022 0942   CREATININE 0.67 11/14/2022 0942   CALCIUM 9.2 11/14/2022 0942   PROT 7.0 11/14/2022 0942   ALBUMIN 3.9 11/14/2022 0942   AST 32 11/14/2022 0942   ALT 74 (H) 11/14/2022 0942   ALKPHOS 88 11/14/2022 0942   BILITOT 0.2 (L) 11/14/2022 0942   GFRNONAA >60 11/14/2022 0942   GFRAA >60 12/23/2015 1151        ASSESSMENT and THERAPY PLAN:   Malignant neoplasm of upper-outer quadrant of right female breast ([place]) This is a very pleasant 52 year old female patient with no significant past medical history, significant family history of breast cancer in sister who died at the age of 15 from metastatic breast cancer referred to breast [place] with a new palpable lump biopsy-proven invasive carcinoma, high-grade, ER staining, PR negative and HER2 negative with a high proliferation index.  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  We have discussed about considering keynote 522 which consists of combination chemotherapy/immunotherapy.  Although lymph node was thought to be  suspicious on the initial imaging, this was not seen on the ultrasound to consider biopsy hence it was not biopsied. She is now on chemotherapy cycle 1 day 1 on 10/24/2022.  Review of systems pertinent for fatigue and alopecia.  Physical examination, the right breast mass in the upper outer quadrant appears less prominent with less bulk, no significant change in size.  No palpable regional adenopathy.  Okay to proceed with labs today if labs are all within parameters.   All questions were answered. The patient knows to call the clinic with any problems, questions or concerns. We can certainly see the patient much sooner if necessary.  Total encounter time:30 minutes*in face-to-face visit time, chart review, lab review, care coordination, order entry, and documentation of the encounter time.    *Total Encounter Time as defined by the Centers for Medicare and Medicaid Services includes, in addition to the face-to-face time of a patient visit (documented in the note above) non-face-to-face time: obtaining and reviewing outside history, ordering and reviewing medications, tests or procedures, care coordination (communications with other health care professionals or caregivers) and documentation in the medical record.

## 2022-11-12 NOTE — Assessment & Plan Note (Addendum)
This is a very pleasant 52 year old female patient with no significant past medical history, significant family history of breast cancer in sister who died at the age of 71 from metastatic breast cancer referred to breast [place] with a new palpable lump biopsy-proven invasive carcinoma, high-grade, ER staining, PR negative and HER2 negative with a high proliferation index.  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  We have discussed about considering keynote 522 which consists of combination chemotherapy/immunotherapy.  Although lymph node was thought to be suspicious on the initial imaging, this was not seen on the ultrasound to consider biopsy hence it was not biopsied. She is now on chemotherapy cycle 1 day 1 on 10/24/2022.  Review of systems pertinent for fatigue and alopecia.  Physical examination, the right breast mass in the upper outer quadrant appears less prominent with less bulk, no significant change in size.  No palpable regional adenopathy.  Okay to proceed with labs today if labs are all within parameters.

## 2022-11-13 MED FILL — Fosaprepitant Dimeglumine For IV Infusion 150 MG (Base Eq): INTRAVENOUS | Qty: 5 | Status: AC

## 2022-11-13 MED FILL — Dexamethasone Sodium Phosphate Inj 100 MG/10ML: INTRAMUSCULAR | Qty: 1 | Status: AC

## 2022-11-14 ENCOUNTER — Inpatient Hospital Stay (HOSPITAL_BASED_OUTPATIENT_CLINIC_OR_DEPARTMENT_OTHER): Payer: Medicare HMO | Admitting: Hematology and Oncology

## 2022-11-14 ENCOUNTER — Encounter: Payer: Self-pay | Admitting: Hematology and Oncology

## 2022-11-14 ENCOUNTER — Inpatient Hospital Stay: Payer: Medicare HMO

## 2022-11-14 VITALS — BP 110/62 | HR 73 | Temp 97.5°F | Resp 16 | Ht 61.0 in | Wt 161.1 lb

## 2022-11-14 DIAGNOSIS — Z79899 Other long term (current) drug therapy: Secondary | ICD-10-CM | POA: Diagnosis not present

## 2022-11-14 DIAGNOSIS — Z170 Estrogen receptor positive status [ER+]: Secondary | ICD-10-CM | POA: Diagnosis not present

## 2022-11-14 DIAGNOSIS — Z5111 Encounter for antineoplastic chemotherapy: Secondary | ICD-10-CM | POA: Diagnosis not present

## 2022-11-14 DIAGNOSIS — C50411 Malignant neoplasm of upper-outer quadrant of right female breast: Secondary | ICD-10-CM

## 2022-11-14 DIAGNOSIS — Z803 Family history of malignant neoplasm of breast: Secondary | ICD-10-CM | POA: Diagnosis not present

## 2022-11-14 LAB — CMP (CANCER CENTER ONLY)
ALT: 74 U/L — ABNORMAL HIGH (ref 0–44)
AST: 32 U/L (ref 15–41)
Albumin: 3.9 g/dL (ref 3.5–5.0)
Alkaline Phosphatase: 88 U/L (ref 38–126)
Anion gap: 3 — ABNORMAL LOW (ref 5–15)
BUN: 17 mg/dL (ref 6–20)
CO2: 30 mmol/L (ref 22–32)
Calcium: 9.2 mg/dL (ref 8.9–10.3)
Chloride: 107 mmol/L (ref 98–111)
Creatinine: 0.67 mg/dL (ref 0.44–1.00)
GFR, Estimated: >60 mL/min (ref >60)
Glucose, Bld: 108 mg/dL — ABNORMAL HIGH (ref 70–99)
Potassium: 4.2 mmol/L (ref 3.5–5.1)
Sodium: 140 mmol/L (ref 135–145)
Total Bilirubin: 0.2 mg/dL — ABNORMAL LOW (ref 0.3–1.2)
Total Protein: 7 g/dL (ref 6.5–8.1)

## 2022-11-14 LAB — CBC WITH DIFFERENTIAL (CANCER CENTER ONLY)
Abs Immature Granulocytes: 0.04 10*3/uL (ref 0.00–0.07)
Basophils Absolute: 0 10*3/uL (ref 0.0–0.1)
Basophils Relative: 1 %
Eosinophils Absolute: 0.1 10*3/uL (ref 0.0–0.5)
Eosinophils Relative: 2 %
HCT: 32.5 % — ABNORMAL LOW (ref 36.0–46.0)
Hemoglobin: 10.7 g/dL — ABNORMAL LOW (ref 12.0–15.0)
Immature Granulocytes: 1 %
Lymphocytes Relative: 30 %
Lymphs Abs: 1 10*3/uL (ref 0.7–4.0)
MCH: 29 pg (ref 26.0–34.0)
MCHC: 32.9 g/dL (ref 30.0–36.0)
MCV: 88.1 fL (ref 80.0–100.0)
Monocytes Absolute: 0.5 10*3/uL (ref 0.1–1.0)
Monocytes Relative: 15 %
Neutro Abs: 1.8 10*3/uL (ref 1.7–7.7)
Neutrophils Relative %: 51 %
Platelet Count: 119 10*3/uL — ABNORMAL LOW (ref 150–400)
RBC: 3.69 MIL/uL — ABNORMAL LOW (ref 3.87–5.11)
RDW: 12.7 % (ref 11.5–15.5)
WBC Count: 3.3 10*3/uL — ABNORMAL LOW (ref 4.0–10.5)
nRBC: 0 % (ref 0.0–0.2)

## 2022-11-14 MED ORDER — SODIUM CHLORIDE 0.9 % IV SOLN
239.6000 mg | Freq: Once | INTRAVENOUS | Status: AC
  Administered 2022-11-14: 240 mg via INTRAVENOUS
  Filled 2022-11-14: qty 24

## 2022-11-14 MED ORDER — SODIUM CHLORIDE 0.9 % IV SOLN: Freq: Once | INTRAVENOUS | Status: AC

## 2022-11-14 MED ORDER — SODIUM CHLORIDE 0.9 % IV SOLN
200.0000 mg | Freq: Once | INTRAVENOUS | Status: AC
  Administered 2022-11-14: 200 mg via INTRAVENOUS
  Filled 2022-11-14: qty 8

## 2022-11-14 MED ORDER — FAMOTIDINE IN NACL 20-0.9 MG/50ML-% IV SOLN
20.0000 mg | Freq: Once | INTRAVENOUS | Status: AC
  Administered 2022-11-14: 20 mg via INTRAVENOUS
  Filled 2022-11-14: qty 50

## 2022-11-14 MED ORDER — SODIUM CHLORIDE 0.9% FLUSH
10.0000 mL | INTRAVENOUS | Status: DC | PRN
  Administered 2022-11-14: 10 mL

## 2022-11-14 MED ORDER — PALONOSETRON HCL INJECTION 0.25 MG/5ML
0.2500 mg | Freq: Once | INTRAVENOUS | Status: AC
  Administered 2022-11-14: 0.25 mg via INTRAVENOUS
  Filled 2022-11-14: qty 5

## 2022-11-14 MED ORDER — SODIUM CHLORIDE 0.9 % IV SOLN
150.0000 mg | Freq: Once | INTRAVENOUS | Status: AC
  Administered 2022-11-14: 150 mg via INTRAVENOUS
  Filled 2022-11-14: qty 150

## 2022-11-14 MED ORDER — SODIUM CHLORIDE 0.9 % IV SOLN
10.0000 mg | Freq: Once | INTRAVENOUS | Status: AC
  Administered 2022-11-14: 10 mg via INTRAVENOUS
  Filled 2022-11-14: qty 10

## 2022-11-14 MED ORDER — SODIUM CHLORIDE 0.9 % IV SOLN
80.0000 mg/m2 | Freq: Once | INTRAVENOUS | Status: AC
  Administered 2022-11-14: 138 mg via INTRAVENOUS
  Filled 2022-11-14: qty 23

## 2022-11-14 MED ORDER — HEPARIN SOD (PORK) LOCK FLUSH 100 UNIT/ML IV SOLN
500.0000 [IU] | Freq: Once | INTRAVENOUS | Status: AC | PRN
  Administered 2022-11-14: 500 [IU]

## 2022-11-14 MED ORDER — DIPHENHYDRAMINE HCL 50 MG/ML IJ SOLN
50.0000 mg | Freq: Once | INTRAMUSCULAR | Status: AC
  Administered 2022-11-14: 50 mg via INTRAVENOUS
  Filled 2022-11-14: qty 1

## 2022-11-14 NOTE — Patient Instructions (Signed)
[facility]  Discharge Instructions: Thank you for choosing [facility] to provide your oncology and hematology care.   If you have a lab appointment with the [facility], please go directly to the [facility] and check in at the registration area.   Wear comfortable clothing and clothing appropriate for easy access to any Portacath or PICC line.   We strive to give you quality time with your provider. You may need to reschedule your appointment if you arrive late (15 or more minutes).  Arriving late affects you and other patients whose appointments are after yours.  Also, if you miss three or more appointments without notifying the office, you may be dismissed from the clinic at the provider's discretion.      For prescription refill requests, have your pharmacy contact our office and allow 72 hours for refills to be completed.    Today you received the following chemotherapy and/or immunotherapy agents: keytruda, taxol, carboplatin      To help prevent nausea and vomiting after your treatment, we encourage you to take your nausea medication as directed.  BELOW ARE SYMPTOMS THAT SHOULD BE REPORTED IMMEDIATELY: *FEVER GREATER THAN 100.4 F (38 C) OR HIGHER *CHILLS OR SWEATING *NAUSEA AND VOMITING THAT IS NOT CONTROLLED WITH YOUR NAUSEA MEDICATION *UNUSUAL SHORTNESS OF BREATH *UNUSUAL BRUISING OR BLEEDING *URINARY PROBLEMS (pain or burning when urinating, or frequent urination) *BOWEL PROBLEMS (unusual diarrhea, constipation, pain near the anus) TENDERNESS IN MOUTH AND THROAT WITH OR WITHOUT PRESENCE OF ULCERS (sore throat, sores in mouth, or a toothache) UNUSUAL RASH, SWELLING OR PAIN  UNUSUAL VAGINAL DISCHARGE OR ITCHING   Items with * indicate a potential emergency and should be followed up as soon as possible or go to the Emergency Department if any problems should occur.  Please show the CHEMOTHERAPY ALERT CARD or IMMUNOTHERAPY  ALERT CARD at check-in to the Emergency Department and triage nurse.  Should you have questions after your visit or need to cancel or reschedule your appointment, please contact [facility]  Dept: [phone]  and follow the prompts.  Office hours are 8:00 a.m. to 4:30 p.m. Monday - Friday. Please note that voicemails left after 4:00 p.m. may not be returned until the following business day.  We are closed weekends and major holidays. You have access to a nurse at all times for urgent questions. Please call the main number to the clinic Dept: [phone] and follow the prompts.   For any non-urgent questions, you may also contact your provider using MyChart. We now offer e-Visits for anyone 27 and older to request care online for non-urgent symptoms. For details visit [email or web address].   Also download the MyChart app! Go to the app store, search "MyChart", open the app, select [facility], and log in with your MyChart username and password.

## 2022-11-15 ENCOUNTER — Other Ambulatory Visit: Payer: Self-pay | Admitting: Hematology and Oncology

## 2022-11-15 NOTE — Progress Notes (Signed)
.    Treatment plan reviewed

## 2022-11-17 ENCOUNTER — Other Ambulatory Visit: Payer: Self-pay | Admitting: Hematology and Oncology

## 2022-11-17 DIAGNOSIS — C50411 Malignant neoplasm of upper-outer quadrant of right female breast: Secondary | ICD-10-CM

## 2022-11-19 ENCOUNTER — Telehealth: Payer: Self-pay | Admitting: Hematology and Oncology

## 2022-11-19 NOTE — Telephone Encounter (Signed)
Spoke with patient confirming upcoming appointment change  

## 2022-11-20 MED FILL — Dexamethasone Sodium Phosphate Inj 100 MG/10ML: INTRAMUSCULAR | Qty: 1 | Status: AC

## 2022-11-21 ENCOUNTER — Other Ambulatory Visit: Payer: Self-pay | Admitting: *Deleted

## 2022-11-21 ENCOUNTER — Inpatient Hospital Stay: Payer: Medicare HMO

## 2022-11-21 ENCOUNTER — Inpatient Hospital Stay (HOSPITAL_BASED_OUTPATIENT_CLINIC_OR_DEPARTMENT_OTHER): Payer: Medicare HMO | Admitting: Hematology and Oncology

## 2022-11-21 VITALS — BP 139/60 | HR 76 | Temp 97.7°F | Resp 16 | Ht 61.0 in | Wt 162.7 lb

## 2022-11-21 DIAGNOSIS — Z170 Estrogen receptor positive status [ER+]: Secondary | ICD-10-CM | POA: Diagnosis not present

## 2022-11-21 DIAGNOSIS — Z5111 Encounter for antineoplastic chemotherapy: Secondary | ICD-10-CM | POA: Diagnosis not present

## 2022-11-21 DIAGNOSIS — Z803 Family history of malignant neoplasm of breast: Secondary | ICD-10-CM | POA: Diagnosis not present

## 2022-11-21 DIAGNOSIS — Z79899 Other long term (current) drug therapy: Secondary | ICD-10-CM | POA: Diagnosis not present

## 2022-11-21 DIAGNOSIS — C50411 Malignant neoplasm of upper-outer quadrant of right female breast: Secondary | ICD-10-CM

## 2022-11-21 DIAGNOSIS — Z95828 Presence of other vascular implants and grafts: Secondary | ICD-10-CM

## 2022-11-21 LAB — CBC WITH DIFFERENTIAL (CANCER CENTER ONLY)
Abs Immature Granulocytes: 0.02 10*3/uL (ref 0.00–0.07)
Basophils Absolute: 0 10*3/uL (ref 0.0–0.1)
Basophils Relative: 0 %
Eosinophils Absolute: 0 10*3/uL (ref 0.0–0.5)
Eosinophils Relative: 1 %
HCT: 29.7 % — ABNORMAL LOW (ref 36.0–46.0)
Hemoglobin: 9.9 g/dL — ABNORMAL LOW (ref 12.0–15.0)
Immature Granulocytes: 1 %
Lymphocytes Relative: 34 %
Lymphs Abs: 0.9 10*3/uL (ref 0.7–4.0)
MCH: 29 pg (ref 26.0–34.0)
MCHC: 33.3 g/dL (ref 30.0–36.0)
MCV: 87.1 fL (ref 80.0–100.0)
Monocytes Absolute: 0.2 10*3/uL (ref 0.1–1.0)
Monocytes Relative: 8 %
Neutro Abs: 1.6 10*3/uL — ABNORMAL LOW (ref 1.7–7.7)
Neutrophils Relative %: 56 %
Platelet Count: 142 10*3/uL — ABNORMAL LOW (ref 150–400)
RBC: 3.41 MIL/uL — ABNORMAL LOW (ref 3.87–5.11)
RDW: 12.7 % (ref 11.5–15.5)
WBC Count: 2.8 10*3/uL — ABNORMAL LOW (ref 4.0–10.5)
nRBC: 0 % (ref 0.0–0.2)

## 2022-11-21 LAB — PREGNANCY, URINE: Preg Test, Ur: NEGATIVE

## 2022-11-21 LAB — CMP (CANCER CENTER ONLY)
ALT: 46 U/L — ABNORMAL HIGH (ref 0–44)
AST: 25 U/L (ref 15–41)
Albumin: 4 g/dL (ref 3.5–5.0)
Alkaline Phosphatase: 86 U/L (ref 38–126)
Anion gap: 4 — ABNORMAL LOW (ref 5–15)
BUN: 15 mg/dL (ref 6–20)
CO2: 31 mmol/L (ref 22–32)
Calcium: 9 mg/dL (ref 8.9–10.3)
Chloride: 103 mmol/L (ref 98–111)
Creatinine: 0.65 mg/dL (ref 0.44–1.00)
GFR, Estimated: >60 mL/min (ref >60)
Glucose, Bld: 113 mg/dL — ABNORMAL HIGH (ref 70–99)
Potassium: 3.7 mmol/L (ref 3.5–5.1)
Sodium: 138 mmol/L (ref 135–145)
Total Bilirubin: 0.3 mg/dL (ref 0.3–1.2)
Total Protein: 6.9 g/dL (ref 6.5–8.1)

## 2022-11-21 MED ORDER — SODIUM CHLORIDE 0.9 % IV SOLN
183.1500 mg | Freq: Once | INTRAVENOUS | Status: AC
  Administered 2022-11-21: 180 mg via INTRAVENOUS
  Filled 2022-11-21: qty 17.76

## 2022-11-21 MED ORDER — SODIUM CHLORIDE 0.9 % IV SOLN: Freq: Once | INTRAVENOUS | Status: AC

## 2022-11-21 MED ORDER — HEPARIN SOD (PORK) LOCK FLUSH 100 UNIT/ML IV SOLN
500.0000 [IU] | Freq: Once | INTRAVENOUS | Status: AC | PRN
  Administered 2022-11-21: 500 [IU]

## 2022-11-21 MED ORDER — SODIUM CHLORIDE 0.9 % IV SOLN
10.0000 mg | Freq: Once | INTRAVENOUS | Status: AC
  Administered 2022-11-21: 10 mg via INTRAVENOUS
  Filled 2022-11-21: qty 10

## 2022-11-21 MED ORDER — SODIUM CHLORIDE 0.9% FLUSH
10.0000 mL | INTRAVENOUS | Status: DC | PRN
  Administered 2022-11-21: 10 mL

## 2022-11-21 MED ORDER — SODIUM CHLORIDE 0.9% FLUSH
10.0000 mL | Freq: Once | INTRAVENOUS | Status: AC
  Administered 2022-11-21: 10 mL

## 2022-11-21 MED ORDER — FAMOTIDINE IN NACL 20-0.9 MG/50ML-% IV SOLN
20.0000 mg | Freq: Once | INTRAVENOUS | Status: AC
  Administered 2022-11-21: 20 mg via INTRAVENOUS
  Filled 2022-11-21: qty 50

## 2022-11-21 MED ORDER — DIPHENHYDRAMINE HCL 50 MG/ML IJ SOLN
50.0000 mg | Freq: Once | INTRAMUSCULAR | Status: AC
  Administered 2022-11-21: 50 mg via INTRAVENOUS
  Filled 2022-11-21: qty 1

## 2022-11-21 MED ORDER — SODIUM CHLORIDE 0.9 % IV SOLN
80.0000 mg/m2 | Freq: Once | INTRAVENOUS | Status: AC
  Administered 2022-11-21: 144 mg via INTRAVENOUS
  Filled 2022-11-21: qty 24

## 2022-11-21 MED ORDER — PALONOSETRON HCL INJECTION 0.25 MG/5ML
0.2500 mg | Freq: Once | INTRAVENOUS | Status: AC
  Administered 2022-11-21: 0.25 mg via INTRAVENOUS
  Filled 2022-11-21: qty 5

## 2022-11-21 NOTE — Assessment & Plan Note (Signed)
This is a very pleasant 52 year old female patient with no significant past medical history, significant family history of breast cancer in sister who died at the age of 77 from metastatic breast cancer referred to breast [facility] with a new palpable lump biopsy-proven invasive carcinoma, high-grade, ER staining, PR negative and HER2 negative with a high proliferation index.  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  We have discussed about considering keynote 522 which consists of combination chemotherapy/immunotherapy.   Although lymph node was thought to be suspicious on the initial imaging, this was not seen on the ultrasound to consider biopsy hence it was not biopsied. She is now on chemotherapy per KEYNOTE 522.  Right upper outer quadrant breast mass palpable, about the same size as last visit.  Patient however feels that it is less bulky.  No major toxicities reported with treatment today.  We will proceed with chemotherapy as scheduled.  She will return to clinic before cycle 3-day 1.

## 2022-11-21 NOTE — Patient Instructions (Signed)
[facility]  Discharge Instructions: Thank you for choosing [facility] to provide your oncology and hematology care.   If you have a lab appointment with the Cancer Center, please go directly to the Cancer Center and check in at the registration area.   Wear comfortable clothing and clothing appropriate for easy access to any Portacath or PICC line.   We strive to give you quality time with your provider. You may need to reschedule your appointment if you arrive late (15 or more minutes).  Arriving late affects you and other patients whose appointments are after yours.  Also, if you miss three or more appointments without notifying the office, you may be dismissed from the clinic at the provider's discretion.      For prescription refill requests, have your pharmacy contact our office and allow 72 hours for refills to be completed.    Today you received the following chemotherapy and/or immunotherapy agents: Paclitaxel, Carboplatin      To help prevent nausea and vomiting after your treatment, we encourage you to take your nausea medication as directed.  BELOW ARE SYMPTOMS THAT SHOULD BE REPORTED IMMEDIATELY: *FEVER GREATER THAN 100.4 F (38 C) OR HIGHER *CHILLS OR SWEATING *NAUSEA AND VOMITING THAT IS NOT CONTROLLED WITH YOUR NAUSEA MEDICATION *UNUSUAL SHORTNESS OF BREATH *UNUSUAL BRUISING OR BLEEDING *URINARY PROBLEMS (pain or burning when urinating, or frequent urination) *BOWEL PROBLEMS (unusual diarrhea, constipation, pain near the anus) TENDERNESS IN MOUTH AND THROAT WITH OR WITHOUT PRESENCE OF ULCERS (sore throat, sores in mouth, or a toothache) UNUSUAL RASH, SWELLING OR PAIN  UNUSUAL VAGINAL DISCHARGE OR ITCHING   Items with * indicate a potential emergency and should be followed up as soon as possible or go to the Emergency Department if any problems should occur.  Please show the CHEMOTHERAPY ALERT CARD or IMMUNOTHERAPY ALERT  CARD at check-in to the Emergency Department and triage nurse.  Should you have questions after your visit or need to cancel or reschedule your appointment, please contact [facility]  Dept: [phone]  and follow the prompts.  Office hours are 8:00 a.m. to 4:30 p.m. Monday - Friday. Please note that voicemails left after 4:00 p.m. may not be returned until the following business day.  We are closed weekends and major holidays. You have access to a nurse at all times for urgent questions. Please call the main number to the clinic Dept: [phone] and follow the prompts.   For any non-urgent questions, you may also contact your provider using MyChart. We now offer e-Visits for anyone 18 and older to request care online for non-urgent symptoms. For details visit [email or web address].   Also download the MyChart app! Go to the app store, search "MyChart", open the app, select , and log in with your MyChart username and password.  Paclitaxel Injection What is this medication? PACLITAXEL (PAK li TAX el) treats some types of cancer. It works by slowing down the growth of cancer cells. This medicine may be used for other purposes; ask your health care provider or pharmacist if you have questions. COMMON BRAND NAME(S): Onxol, Taxol What should I tell my care team before I take this medication? They need to know if you have any of these conditions: Heart disease Liver disease Low white blood cell levels An unusual or allergic reaction to paclitaxel, other medications, foods, dyes, or preservatives If you or your partner are pregnant or trying to   get pregnant Breast-feeding How should I use this medication? This medication is injected into a vein. It is given by your care team in a hospital or clinic setting. Talk to your care team about the use of this medication in children. While it may be given to children for selected conditions, precautions  do apply. Overdosage: If you think you have taken too much of this medicine contact a poison control center or emergency room at once. NOTE: This medicine is only for you. Do not share this medicine with others. What if I miss a dose? Keep appointments for follow-up doses. It is important not to miss your dose. Call your care team if you are unable to keep an appointment. What may interact with this medication? Do not take this medication with any of the following: Live virus vaccines Other medications may affect the way this medication works. Talk with your care team about all of the medications you take. They may suggest changes to your treatment plan to lower the risk of side effects and to make sure your medications work as intended. This list may not describe all possible interactions. Give your health care provider a list of all the medicines, herbs, non-prescription drugs, or dietary supplements you use. Also tell them if you smoke, drink alcohol, or use illegal drugs. Some items may interact with your medicine. What should I watch for while using this medication? Your condition will be monitored carefully while you are receiving this medication. You may need blood work while taking this medication. This medication may make you feel generally unwell. This is not uncommon as chemotherapy can affect healthy cells as well as cancer cells. Report any side effects. Continue your course of treatment even though you feel ill unless your care team tells you to stop. This medication can cause serious allergic reactions. To reduce the risk, your care team may give you other medications to take before receiving this one. Be sure to follow the directions from your care team. This medication may increase your risk of getting an infection. Call your care team for advice if you get a fever, chills, sore throat, or other symptoms of a cold or flu. Do not treat yourself. Try to avoid being around people who are  sick. This medication may increase your risk to bruise or bleed. Call your care team if you notice any unusual bleeding. Be careful brushing or flossing your teeth or using a toothpick because you may get an infection or bleed more easily. If you have any dental work done, tell your dentist you are receiving this medication. Talk to your care team if you may be pregnant. Serious birth defects can occur if you take this medication during pregnancy. Talk to your care team before breastfeeding. Changes to your treatment plan may be needed. What side effects may I notice from receiving this medication? Side effects that you should report to your care team as soon as possible: Allergic reactions--skin rash, itching, hives, swelling of the face, lips, tongue, or throat Heart rhythm changes--fast or irregular heartbeat, dizziness, feeling faint or lightheaded, chest pain, trouble breathing Increase in blood pressure Infection--fever, chills, cough, sore throat, wounds that don't heal, pain or trouble when passing urine, general feeling of discomfort or being unwell Low blood pressure--dizziness, feeling faint or lightheaded, blurry vision Low red blood cell level--unusual weakness or fatigue, dizziness, headache, trouble breathing Painful swelling, warmth, or redness of the skin, blisters or sores at the infusion site Pain, tingling, or numbness   in the hands or feet Slow heartbeat--dizziness, feeling faint or lightheaded, confusion, trouble breathing, unusual weakness or fatigue Unusual bruising or bleeding Side effects that usually do not require medical attention (report to your care team if they continue or are bothersome): Diarrhea Hair loss Joint pain Loss of appetite Muscle pain Nausea Vomiting This list may not describe all possible side effects. Call your doctor for medical advice about side effects. You may report side effects to FDA at 1-800-FDA-1088. Where should I keep my  medication? This medication is given in a hospital or clinic. It will not be stored at home. NOTE: This sheet is a summary. It may not cover all possible information. If you have questions about this medicine, talk to your doctor, pharmacist, or health care provider.  2023 Elsevier/Gold Standard (2022-01-02 00:00:00)  Carboplatin Injection What is this medication? CARBOPLATIN (KAR boe pla tin) treats some types of cancer. It works by slowing down the growth of cancer cells. This medicine may be used for other purposes; ask your health care provider or pharmacist if you have questions. COMMON BRAND NAME(S): Paraplatin What should I tell my care team before I take this medication? They need to know if you have any of these conditions: Blood disorders Hearing problems Kidney disease Recent or ongoing radiation therapy An unusual or allergic reaction to carboplatin, cisplatin, other medications, foods, dyes, or preservatives Pregnant or trying to get pregnant Breast-feeding How should I use this medication? This medication is injected into a vein. It is given by your care team in a hospital or clinic setting. Talk to your care team about the use of this medication in children. Special care may be needed. Overdosage: If you think you have taken too much of this medicine contact a poison control center or emergency room at once. NOTE: This medicine is only for you. Do not share this medicine with others. What if I miss a dose? Keep appointments for follow-up doses. It is important not to miss your dose. Call your care team if you are unable to keep an appointment. What may interact with this medication? Medications for seizures Some antibiotics, such as amikacin, gentamicin, neomycin, streptomycin, tobramycin Vaccines This list may not describe all possible interactions. Give your health care provider a list of all the medicines, herbs, non-prescription drugs, or dietary supplements you use.  Also tell them if you smoke, drink alcohol, or use illegal drugs. Some items may interact with your medicine. What should I watch for while using this medication? Your condition will be monitored carefully while you are receiving this medication. You may need blood work while taking this medication. This medication may make you feel generally unwell. This is not uncommon, as chemotherapy can affect healthy cells as well as cancer cells. Report any side effects. Continue your course of treatment even though you feel ill unless your care team tells you to stop. In some cases, you may be given additional medications to help with side effects. Follow all directions for their use. This medication may increase your risk of getting an infection. Call your care team for advice if you get a fever, chills, sore throat, or other symptoms of a cold or flu. Do not treat yourself. Try to avoid being around people who are sick. Avoid taking medications that contain aspirin, acetaminophen, ibuprofen, naproxen, or ketoprofen unless instructed by your care team. These medications may hide a fever. Be careful brushing or flossing your teeth or using a toothpick because you may get an   infection or bleed more easily. If you have any dental work done, tell your dentist you are receiving this medication. Talk to your care team if you wish to become pregnant or think you might be pregnant. This medication can cause serious birth defects. Talk to your care team about effective forms of contraception. Do not breast-feed while taking this medication. What side effects may I notice from receiving this medication? Side effects that you should report to your care team as soon as possible: Allergic reactions--skin rash, itching, hives, swelling of the face, lips, tongue, or throat Infection--fever, chills, cough, sore throat, wounds that don't heal, pain or trouble when passing urine, general feeling of discomfort or being  unwell Low red blood cell level--unusual weakness or fatigue, dizziness, headache, trouble breathing Pain, tingling, or numbness in the hands or feet, muscle weakness, change in vision, confusion or trouble speaking, loss of balance or coordination, trouble walking, seizures Unusual bruising or bleeding Side effects that usually do not require medical attention (report to your care team if they continue or are bothersome): Hair loss Nausea Unusual weakness or fatigue Vomiting This list may not describe all possible side effects. Call your doctor for medical advice about side effects. You may report side effects to FDA at 1-800-FDA-1088. Where should I keep my medication? This medication is given in a hospital or clinic. It will not be stored at home. NOTE: This sheet is a summary. It may not cover all possible information. If you have questions about this medicine, talk to your doctor, pharmacist, or health care provider.  2023 Elsevier/Gold Standard (2007-10-09 00:00:00)  

## 2022-11-21 NOTE — Progress Notes (Signed)
[facility] up:    Maria Rm, NP-C [place] [address]   DIAGNOSIS:  Cancer Staging  Malignant neoplasm of upper-outer quadrant of right female breast [facility]) Staging form: Breast, AJCC 8th Edition - Clinical: Stage IIB (cT2, cN0, cM0, G3, ER+, PR-, HER2-) - Signed by Benay Pike, MD on 10/08/2022 Stage prefix: Initial diagnosis Histologic grading system: 3 grade system - Clinical: No stage assigned - Unsigned   SUMMARY OF ONCOLOGIC HISTORY: Oncology History  Malignant neoplasm of upper-outer quadrant of right female breast ([place])  09/26/2022 Mammogram   Patient had a palpable right breast mass and hence underwent bilateral diagnostic mammogram.  This confirmed a suspicious 3 cm right upper outer quadrant mass and an abnormal right axillary lymph node with cortical thickening.  Tissue sampling of both the breast mass and axillary lymph node were recommended.  Left breast was normal.   09/26/2022 Breast US   Ultrasound breast confirmed the above-mentioned findings.   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  Prognostic showed ER 50% positive weak staining PR 0% negative HER2 1+ by IHC and Ki-67 of 70%   10/06/2022 Initial Diagnosis   Malignant neoplasm of upper-outer quadrant of right female breast ([place])   10/08/2022 Cancer Staging   Staging form: Breast, AJCC 8th Edition - Clinical: Stage IIB (cT2, cN0, cM0, G3, ER+, PR-, HER2-) - Signed by Benay Pike, MD on 10/08/2022 Stage prefix: Initial diagnosis Histologic grading system: 3 grade system   10/24/2022 - 11/14/2022 Chemotherapy   Patient is on Treatment Plan : BREAST Pembrolizumab (200) D1 + Carboplatin (5) D1 + Paclitaxel (80) D1,8,15 q21d X 4 cycles / Pembrolizumab (200) D1 + AC D1 q21d x 4 cycles      Genetic Testing   [facility] Common Cancer Panel+RNA was Negative. Report date is 10/16/2022.  The Common  Hereditary Cancers Panel offered by [facility] includes sequencing and/or deletion duplication testing of the following 48 genes: APC, ATM, AXIN2, BAP1, BARD1, BMPR1A, BRCA1, BRCA2, BRIP1, CDH1, CDK4, CDKN2A (p14ARF and p16INK4a only), CHEK2, CTNNA1, DICER1, EPCAM (Deletion/duplication testing only), FH, GREM1 (promoter region duplication testing only), HOXB13, KIT, MBD4, MEN1, MLH1, MSH2, MSH3, MSH6, MUTYH, NF1, NHTL1, PALB2, PDGFRA, PMS2, POLD1, POLE, PTEN, RAD51C, RAD51D, SDHA (sequencing analysis only except exon 14), SDHB, SDHC, SDHD, SMAD4, SMARCA4. STK11, TP53, TSC1, TSC2, and VHL.   11/22/2022 -  Chemotherapy   Patient is on Treatment Plan : BREAST Pembrolizumab (200) D1 + Carboplatin (1.5) D1,8,15 + Paclitaxel (80) D1,8,15 q21d X 4 cycles / Pembrolizumab (200) D1 + AC D1 q21d x 4 cycles       CURRENT THERAPY: Taxol  INTERVAL HISTORY:  Maria Carter 52 y.o. female returns for follow-up and evaluation prior to receiving chemotherapy today.  Cycle 1 day 1 received on 10/24/2022 She says she tolerated it reasonably well except for fatigue.  No change in breathing, bowel habits or urinary habits No new neurological complaints. Right breast has shrunk since last visit, she says. Rest of the pertinent 10 point ROS reviewed and negative  Patient Active Problem List   Diagnosis Date Noted   Port-A-Cath in place 10/24/2022   Genetic testing 10/16/2022   Malignant neoplasm of upper-outer quadrant of right female breast (Maria Carter) 10/06/2022   Thrombocytopenia ([place]) 03/05/2022   Leukopenia 03/05/2022   Hot flashes 02/28/2022   Hair thinning 02/28/2022   Amenorrhea 02/28/2022   Screening for colon cancer 02/28/2022  Multiple nevi 02/28/2022   PTSD (post-traumatic stress disorder) 02/08/2021   Mild episode of recurrent major depressive disorder ([place]) 09/12/2013   Attention deficit disorder 04/25/2013   Migraine 02/05/2013   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  MEDICAL HISTORY: Past Medical History:  Diagnosis Date   Anxiety    Breast cancer (Maria Carter)    Depression    Hx of febrile seizure    1980    SURGICAL HISTORY: Past Surgical History:  Procedure Laterality Date   BREAST BIOPSY Right 09/30/2022   [place] RT BREAST BX W LOC DEV 1ST LESION IMG BX SPEC US GUIDE 09/30/2022 [facility] MAMMOGRAPHY   PORTACATH PLACEMENT N/A 10/23/2022   Procedure: INSERTION PORT-A-CATH;  Surgeon: Coralie Keens, MD;  Location: [facility];  Service: General;  Laterality: N/A;    SOCIAL HISTORY: Social History   Socioeconomic History   Marital status: Single    Spouse name: Not on file   Number of children: Not on file   Years of education: Not on file   Highest education level: Not on file  Occupational History   Not on file  Tobacco Use   Smoking status: Never   Smokeless tobacco: Never  Vaping Use   Vaping Use: Never used  Substance and Sexual Activity   Alcohol use: Yes    Comment: social   Drug use: No   Sexual activity: Not Currently    Partners: Male  Other Topics Concern   Not on file  Social History Narrative   Not on file   Social Determinants of Health   Financial Resource Strain: High Risk (10/08/2022)   Overall Financial Resource Strain (CARDIA)    Difficulty of Paying Living Expenses: Hard  Food Insecurity: Food Insecurity Present (10/08/2022)   Hunger Vital Sign    Worried About Running Out of Food in the Last Year: Sometimes true    Ran Out of Food in the Last Year: Sometimes true  Transportation Needs: No Transportation Needs (10/08/2022)   PRAPARE - Hydrologist (Medical): No    Lack of Transportation (Non-Medical): No  Physical Activity: Not on file  Stress: Not on file  Social Connections: Not on file  Intimate Partner Violence: Not on file    FAMILY  HISTORY: Family History  Problem Relation Age of Onset   Breast cancer Sister 49 - 26   Cancer Paternal Grandmother        unknown type    Review of Systems  Constitutional:  Positive for fatigue. Negative for appetite change, chills, fever and unexpected weight change.  HENT:   Negative for hearing loss, lump/mass and trouble swallowing.   Eyes:  Negative for eye problems and icterus.  Respiratory:  Negative for chest tightness, cough and shortness of breath.   Cardiovascular:  Negative for chest pain, leg swelling and palpitations.  Gastrointestinal:  Negative for abdominal distention, abdominal pain, constipation, diarrhea, nausea and vomiting.  Endocrine: Negative for hot flashes.  Genitourinary:  Negative for difficulty urinating.   Musculoskeletal:  Negative for arthralgias.  Skin:  Negative for itching and rash.  Neurological:  Negative for dizziness, extremity weakness, headaches and numbness.  Hematological:  Negative for adenopathy. Does not bruise/bleed easily.  Psychiatric/Behavioral:  Negative for depression. The patient is not nervous/anxious.       PHYSICAL EXAMINATION  ECOG PERFORMANCE STATUS: 1 - Symptomatic but completely ambulatory  Vitals:   11/21/22 1251  BP: 139/60  Pulse: 76  Resp: 16  Temp: 97.7 F (36.5 C)  SpO2: 100%    Physical Exam Constitutional:      General: She is not in acute distress.    Appearance: Normal appearance. She is not toxic-appearing.  HENT:     Head: Normocephalic and atraumatic.  Eyes:     General: No scleral icterus. Cardiovascular:     Rate and Rhythm: Normal rate and regular rhythm.     Pulses: Normal pulses.     Heart sounds: Normal heart sounds.  Pulmonary:     Effort: Pulmonary effort is normal.     Breath sounds: Normal breath sounds.  Chest:       Comments: Right upper quadrant mass noticed to be about stable since last visit. Abdominal:     General: Abdomen is flat. Bowel sounds are normal. There is no  distension.     Palpations: Abdomen is soft.     Tenderness: There is no abdominal tenderness.  Musculoskeletal:        General: No swelling.     Cervical back: Neck supple.  Lymphadenopathy:     Cervical: No cervical adenopathy.  Skin:    General: Skin is warm and dry.     Findings: No rash.  Neurological:     General: No focal deficit present.     Mental Status: She is alert.  Psychiatric:        Mood and Affect: Mood normal.        Behavior: Behavior normal.     LABORATORY DATA:  CBC    Component Value Date/Time   WBC 2.8 (L) 11/21/2022 1230   WBC 2.9 (L) 02/28/2022 1049   RBC 3.41 (L) 11/21/2022 1230   HGB 9.9 (L) 11/21/2022 1230   HCT 29.7 (L) 11/21/2022 1230   PLT 142 (L) 11/21/2022 1230   MCV 87.1 11/21/2022 1230   MCH 29.0 11/21/2022 1230   MCHC 33.3 11/21/2022 1230   RDW 12.7 11/21/2022 1230   LYMPHSABS 0.9 11/21/2022 1230   MONOABS 0.2 11/21/2022 1230   EOSABS 0.0 11/21/2022 1230   BASOSABS 0.0 11/21/2022 1230    CMP     Component Value Date/Time   NA 138 11/21/2022 1230   K 3.7 11/21/2022 1230   CL 103 11/21/2022 1230   CO2 31 11/21/2022 1230   GLUCOSE 113 (H) 11/21/2022 1230   BUN 15 11/21/2022 1230   CREATININE 0.65 11/21/2022 1230   CALCIUM 9.0 11/21/2022 1230   PROT 6.9 11/21/2022 1230   ALBUMIN 4.0 11/21/2022 1230   AST 25 11/21/2022 1230   ALT 46 (H) 11/21/2022 1230   ALKPHOS 86 11/21/2022 1230   BILITOT 0.3 11/21/2022 1230   GFRNONAA >60 11/21/2022 1230   GFRAA >60 12/23/2015 1151        ASSESSMENT and THERAPY PLAN:   Malignant neoplasm of upper-outer quadrant of right female breast ([place]) This is a very pleasant 52 year old female patient with no significant past medical history, significant family history of breast cancer in sister who died at the age of 70 from metastatic breast cancer referred to breast [place] with a new palpable lump biopsy-proven invasive carcinoma, high-grade, ER staining, PR negative and HER2 negative with a  high proliferation index.  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  We have discussed about considering  keynote 522 which consists of combination chemotherapy/immunotherapy.   Although lymph node was thought to be suspicious on the initial imaging, this was not seen on the ultrasound to consider biopsy hence it was not biopsied. She is now on chemotherapy per KEYNOTE 522.  Right upper outer quadrant breast mass palpable, about the same size as last visit.  Patient however feels that it is less bulky.  No major toxicities reported with treatment today.  We will proceed with chemotherapy as scheduled.  She will return to clinic before cycle 3-day 1.   All questions were answered. The patient knows to call the clinic with any problems, questions or concerns. We can certainly see the patient much sooner if necessary.  Total encounter time:30 minutes*in face-to-face visit time, chart review, lab review, care coordination, order entry, and documentation of the encounter time.    *Total Encounter Time as defined by the Centers for Medicare and Medicaid Services includes, in addition to the face-to-face time of a patient visit (documented in the note above) non-face-to-face time: obtaining and reviewing outside history, ordering and reviewing medications, tests or procedures, care coordination (communications with other health care professionals or caregivers) and documentation in the medical record.

## 2022-11-26 ENCOUNTER — Inpatient Hospital Stay: Payer: Medicare HMO

## 2022-11-26 ENCOUNTER — Ambulatory Visit: Payer: Medicare HMO | Admitting: Hematology and Oncology

## 2022-11-28 ENCOUNTER — Inpatient Hospital Stay: Payer: Medicare HMO | Admitting: Hematology and Oncology

## 2022-11-28 ENCOUNTER — Inpatient Hospital Stay: Payer: Medicare HMO

## 2022-11-28 ENCOUNTER — Other Ambulatory Visit: Payer: Self-pay | Admitting: Physician Assistant

## 2022-11-28 DIAGNOSIS — Z79899 Other long term (current) drug therapy: Secondary | ICD-10-CM | POA: Diagnosis not present

## 2022-11-28 DIAGNOSIS — Z803 Family history of malignant neoplasm of breast: Secondary | ICD-10-CM | POA: Diagnosis not present

## 2022-11-28 DIAGNOSIS — Z95828 Presence of other vascular implants and grafts: Secondary | ICD-10-CM

## 2022-11-28 DIAGNOSIS — Z170 Estrogen receptor positive status [ER+]: Secondary | ICD-10-CM

## 2022-11-28 DIAGNOSIS — Z5111 Encounter for antineoplastic chemotherapy: Secondary | ICD-10-CM | POA: Diagnosis not present

## 2022-11-28 DIAGNOSIS — C50411 Malignant neoplasm of upper-outer quadrant of right female breast: Secondary | ICD-10-CM

## 2022-11-28 LAB — CBC WITH DIFFERENTIAL (CANCER CENTER ONLY)
Abs Immature Granulocytes: 0.01 10*3/uL (ref 0.00–0.07)
Basophils Absolute: 0 10*3/uL (ref 0.0–0.1)
Basophils Relative: 1 %
Eosinophils Absolute: 0 10*3/uL (ref 0.0–0.5)
Eosinophils Relative: 1 %
HCT: 30.9 % — ABNORMAL LOW (ref 36.0–46.0)
Hemoglobin: 10 g/dL — ABNORMAL LOW (ref 12.0–15.0)
Immature Granulocytes: 1 %
Lymphocytes Relative: 39 %
Lymphs Abs: 0.8 10*3/uL (ref 0.7–4.0)
MCH: 28.9 pg (ref 26.0–34.0)
MCHC: 32.4 g/dL (ref 30.0–36.0)
MCV: 89.3 fL (ref 80.0–100.0)
Monocytes Absolute: 0.3 10*3/uL (ref 0.1–1.0)
Monocytes Relative: 13 %
Neutro Abs: 0.9 10*3/uL — ABNORMAL LOW (ref 1.7–7.7)
Neutrophils Relative %: 45 %
Platelet Count: 216 10*3/uL (ref 150–400)
RBC: 3.46 MIL/uL — ABNORMAL LOW (ref 3.87–5.11)
RDW: 13.2 % (ref 11.5–15.5)
WBC Count: 2 10*3/uL — ABNORMAL LOW (ref 4.0–10.5)
nRBC: 0 % (ref 0.0–0.2)

## 2022-11-28 LAB — CMP (CANCER CENTER ONLY)
ALT: 21 U/L (ref 0–44)
AST: 17 U/L (ref 15–41)
Albumin: 3.9 g/dL (ref 3.5–5.0)
Alkaline Phosphatase: 83 U/L (ref 38–126)
Anion gap: 3 — ABNORMAL LOW (ref 5–15)
BUN: 17 mg/dL (ref 6–20)
CO2: 30 mmol/L (ref 22–32)
Calcium: 8.9 mg/dL (ref 8.9–10.3)
Chloride: 105 mmol/L (ref 98–111)
Creatinine: 0.65 mg/dL (ref 0.44–1.00)
GFR, Estimated: >60 mL/min (ref >60)
Glucose, Bld: 145 mg/dL — ABNORMAL HIGH (ref 70–99)
Potassium: 3.7 mmol/L (ref 3.5–5.1)
Sodium: 138 mmol/L (ref 135–145)
Total Bilirubin: 0.2 mg/dL — ABNORMAL LOW (ref 0.3–1.2)
Total Protein: 6.9 g/dL (ref 6.5–8.1)

## 2022-11-28 MED ORDER — HEPARIN SOD (PORK) LOCK FLUSH 100 UNIT/ML IV SOLN
500.0000 [IU] | Freq: Once | INTRAVENOUS | Status: AC
  Administered 2022-11-28: 500 [IU] via INTRAVENOUS

## 2022-11-28 MED ORDER — SODIUM CHLORIDE 0.9% FLUSH
10.0000 mL | Freq: Once | INTRAVENOUS | Status: AC
  Administered 2022-11-28: 10 mL

## 2022-11-28 MED ORDER — SODIUM CHLORIDE 0.9% FLUSH
10.0000 mL | Freq: Once | INTRAVENOUS | Status: AC
  Administered 2022-11-28: 10 mL via INTRAVENOUS

## 2022-11-28 NOTE — Progress Notes (Signed)
This RN reached out to Dr Lorenso Courier and Terisa Starr for an ok to tx with ANC 0.9. Both providers agreed to hold tx d/t ANC of 0.9 and new onset severe neuropathy limiting ADL's.  Hinda Lenis RN was in agreement to send a staff message to Dr Chryl Heck (on PAL at this time) to adjust future taxol/carbo dose d/t neuropathy. This RN reviewed pertinent labs with pt and deaccessed the pt. Pt verbalized understanding and ambulated to lobby.

## 2022-11-29 ENCOUNTER — Inpatient Hospital Stay: Payer: Medicare HMO

## 2022-12-01 ENCOUNTER — Inpatient Hospital Stay: Payer: Medicare HMO

## 2022-12-02 ENCOUNTER — Inpatient Hospital Stay: Payer: Medicare HMO

## 2022-12-02 ENCOUNTER — Other Ambulatory Visit: Payer: Self-pay

## 2022-12-03 ENCOUNTER — Encounter: Payer: Self-pay | Admitting: *Deleted

## 2022-12-04 ENCOUNTER — Other Ambulatory Visit: Payer: Self-pay

## 2022-12-04 MED FILL — Dexamethasone Sodium Phosphate Inj 100 MG/10ML: INTRAMUSCULAR | Qty: 1 | Status: AC

## 2022-12-05 ENCOUNTER — Inpatient Hospital Stay (HOSPITAL_BASED_OUTPATIENT_CLINIC_OR_DEPARTMENT_OTHER): Payer: Medicare HMO | Admitting: Hematology and Oncology

## 2022-12-05 ENCOUNTER — Other Ambulatory Visit: Payer: Self-pay | Admitting: Pharmacist

## 2022-12-05 ENCOUNTER — Inpatient Hospital Stay: Payer: Medicare HMO

## 2022-12-05 ENCOUNTER — Inpatient Hospital Stay: Payer: Medicare HMO | Attending: Physician Assistant

## 2022-12-05 VITALS — BP 133/71 | HR 73 | Resp 16

## 2022-12-05 VITALS — BP 130/62 | HR 70 | Temp 97.9°F | Resp 16 | Ht 61.0 in | Wt 157.5 lb

## 2022-12-05 DIAGNOSIS — Z170 Estrogen receptor positive status [ER+]: Secondary | ICD-10-CM | POA: Insufficient documentation

## 2022-12-05 DIAGNOSIS — C50411 Malignant neoplasm of upper-outer quadrant of right female breast: Secondary | ICD-10-CM

## 2022-12-05 DIAGNOSIS — Z95828 Presence of other vascular implants and grafts: Secondary | ICD-10-CM

## 2022-12-05 DIAGNOSIS — Z79899 Other long term (current) drug therapy: Secondary | ICD-10-CM | POA: Diagnosis not present

## 2022-12-05 DIAGNOSIS — Z5111 Encounter for antineoplastic chemotherapy: Secondary | ICD-10-CM | POA: Diagnosis not present

## 2022-12-05 LAB — CMP (CANCER CENTER ONLY)
ALT: 18 U/L (ref 0–44)
AST: 19 U/L (ref 15–41)
Albumin: 4.2 g/dL (ref 3.5–5.0)
Alkaline Phosphatase: 75 U/L (ref 38–126)
Anion gap: 5 (ref 5–15)
BUN: 14 mg/dL (ref 6–20)
CO2: 29 mmol/L (ref 22–32)
Calcium: 9.7 mg/dL (ref 8.9–10.3)
Chloride: 104 mmol/L (ref 98–111)
Creatinine: 0.73 mg/dL (ref 0.44–1.00)
GFR, Estimated: >60 mL/min (ref >60)
Glucose, Bld: 106 mg/dL — ABNORMAL HIGH (ref 70–99)
Potassium: 3.9 mmol/L (ref 3.5–5.1)
Sodium: 138 mmol/L (ref 135–145)
Total Bilirubin: 0.3 mg/dL (ref 0.3–1.2)
Total Protein: 7.5 g/dL (ref 6.5–8.1)

## 2022-12-05 LAB — CBC WITH DIFFERENTIAL (CANCER CENTER ONLY)
Abs Immature Granulocytes: 0.01 10*3/uL (ref 0.00–0.07)
Basophils Absolute: 0 10*3/uL (ref 0.0–0.1)
Basophils Relative: 1 %
Eosinophils Absolute: 0 10*3/uL (ref 0.0–0.5)
Eosinophils Relative: 1 %
HCT: 32.8 % — ABNORMAL LOW (ref 36.0–46.0)
Hemoglobin: 11 g/dL — ABNORMAL LOW (ref 12.0–15.0)
Immature Granulocytes: 0 %
Lymphocytes Relative: 25 %
Lymphs Abs: 0.8 10*3/uL (ref 0.7–4.0)
MCH: 29.4 pg (ref 26.0–34.0)
MCHC: 33.5 g/dL (ref 30.0–36.0)
MCV: 87.7 fL (ref 80.0–100.0)
Monocytes Absolute: 0.5 10*3/uL (ref 0.1–1.0)
Monocytes Relative: 16 %
Neutro Abs: 1.7 10*3/uL (ref 1.7–7.7)
Neutrophils Relative %: 57 %
Platelet Count: 180 10*3/uL (ref 150–400)
RBC: 3.74 MIL/uL — ABNORMAL LOW (ref 3.87–5.11)
RDW: 14.3 % (ref 11.5–15.5)
WBC Count: 3 10*3/uL — ABNORMAL LOW (ref 4.0–10.5)
nRBC: 0 % (ref 0.0–0.2)

## 2022-12-05 MED ORDER — DIPHENHYDRAMINE HCL 50 MG/ML IJ SOLN
50.0000 mg | Freq: Once | INTRAMUSCULAR | Status: AC
  Administered 2022-12-05: 50 mg via INTRAVENOUS
  Filled 2022-12-05: qty 1

## 2022-12-05 MED ORDER — SODIUM CHLORIDE 0.9% FLUSH
10.0000 mL | Freq: Once | INTRAVENOUS | Status: AC
  Administered 2022-12-05: 10 mL

## 2022-12-05 MED ORDER — HEPARIN SOD (PORK) LOCK FLUSH 100 UNIT/ML IV SOLN
500.0000 [IU] | Freq: Once | INTRAVENOUS | Status: AC | PRN
  Administered 2022-12-05: 500 [IU]

## 2022-12-05 MED ORDER — SODIUM CHLORIDE 0.9 % IV SOLN
10.0000 mg | Freq: Once | INTRAVENOUS | Status: AC
  Administered 2022-12-05: 10 mg via INTRAVENOUS
  Filled 2022-12-05: qty 10

## 2022-12-05 MED ORDER — GABAPENTIN 300 MG PO CAPS: 300.0000 mg | ORAL_CAPSULE | Freq: Every day | ORAL | 0 refills | Status: DC

## 2022-12-05 MED ORDER — SODIUM CHLORIDE 0.9 % IV SOLN: Freq: Once | INTRAVENOUS | Status: AC

## 2022-12-05 MED ORDER — SODIUM CHLORIDE 0.9 % IV SOLN
40.0000 mg/m2 | Freq: Once | INTRAVENOUS | Status: AC
  Administered 2022-12-05: 72 mg via INTRAVENOUS
  Filled 2022-12-05: qty 12

## 2022-12-05 MED ORDER — SODIUM CHLORIDE 0.9% FLUSH
10.0000 mL | INTRAVENOUS | Status: DC | PRN
  Administered 2022-12-05: 10 mL

## 2022-12-05 MED ORDER — PALONOSETRON HCL INJECTION 0.25 MG/5ML
0.2500 mg | Freq: Once | INTRAVENOUS | Status: AC
  Administered 2022-12-05: 0.25 mg via INTRAVENOUS
  Filled 2022-12-05: qty 5

## 2022-12-05 MED ORDER — SODIUM CHLORIDE 0.9 % IV SOLN
183.1500 mg | Freq: Once | INTRAVENOUS | Status: AC
  Administered 2022-12-05: 180 mg via INTRAVENOUS
  Filled 2022-12-05: qty 18

## 2022-12-05 MED ORDER — FAMOTIDINE IN NACL 20-0.9 MG/50ML-% IV SOLN
20.0000 mg | Freq: Once | INTRAVENOUS | Status: AC
  Administered 2022-12-05: 20 mg via INTRAVENOUS
  Filled 2022-12-05: qty 50

## 2022-12-05 NOTE — Progress Notes (Signed)
[facility] Cancer Follow up:    [name], NP-C [address] [place] [address]   DIAGNOSIS:  Cancer Staging  Malignant neoplasm of upper-outer quadrant of right female breast Staging form: Breast, AJCC 8th Edition - Clinical: Stage IIB (cT2, cN0, cM0, G3, ER+, PR-, HER2-) - Signed by [name], MD on 10/08/2022 Stage prefix: Initial diagnosis Histologic grading system: 3 grade system - Clinical: No stage assigned - Unsigned   SUMMARY OF ONCOLOGIC HISTORY: Oncology History  Malignant neoplasm of upper-outer quadrant of right female breast  09/26/2022 Mammogram   Patient had a palpable right breast mass and hence underwent bilateral diagnostic mammogram.  This confirmed a suspicious 3 cm right upper outer quadrant mass and an abnormal right axillary lymph node with cortical thickening.  Tissue sampling of both the breast mass and axillary lymph node were recommended.  Left breast was normal.   09/26/2022 Breast US   Ultrasound breast confirmed the above-mentioned findings.   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  Prognostic showed ER 50% positive weak staining PR 0% negative HER2 1+ by IHC and Ki-67 of 70%   10/06/2022 Initial Diagnosis   Malignant neoplasm of upper-outer quadrant of right female breast (HCC)   10/08/2022 Cancer Staging   Staging form: Breast, AJCC 8th Edition - Clinical: Stage IIB (cT2, cN0, cM0, G3, ER+, PR-, HER2-) - Signed by [name], MD on 10/08/2022 Stage prefix: Initial diagnosis Histologic grading system: 3 grade system   10/24/2022 - 11/14/2022 Chemotherapy   Patient is on Treatment Plan : BREAST Pembrolizumab (200) D1 + Carboplatin (5) D1 + Paclitaxel (80) D1,8,15 q21d X 4 cycles / Pembrolizumab (200) D1 + AC D1 q21d x 4 cycles      Genetic Testing   [facility] Common Cancer Panel+RNA was Negative. Report date is 10/16/2022.  The Common Hereditary  Cancers Panel offered by [facility] includes sequencing and/or deletion duplication testing of the following 48 genes: APC, ATM, AXIN2, BAP1, BARD1, BMPR1A, BRCA1, BRCA2, BRIP1, CDH1, CDK4, CDKN2A (p14ARF and p16INK4a only), CHEK2, CTNNA1, DICER1, EPCAM (Deletion/duplication testing only), FH, GREM1 (promoter region duplication testing only), HOXB13, KIT, MBD4, MEN1, MLH1, MSH2, MSH3, MSH6, MUTYH, NF1, NHTL1, PALB2, PDGFRA, PMS2, POLD1, POLE, PTEN, RAD51C, RAD51D, SDHA (sequencing analysis only except exon 14), SDHB, SDHC, SDHD, SMAD4, SMARCA4. STK11, TP53, TSC1, TSC2, and VHL.   11/21/2022 -  Chemotherapy   Patient is on Treatment Plan : BREAST Pembrolizumab (200) D1 + Carboplatin (1.5) D1,8,15 + Paclitaxel (80) D1,8,15 q21d X 4 cycles / Pembrolizumab (200) D1 + AC D1 q21d x 4 cycles       CURRENT THERAPY: Taxol  INTERVAL HISTORY:  Maria Carter 52 y.o. female returns for follow-up and evaluation prior to receiving chemotherapy today.  She says she had 2 real bad days where she had severe burning pain in her feet, tingling in her toes and her feet pretty much all day and she is really worried about persistent neuropathy.  Other than this she has noticed some fatigue.  She has not really followed up on the breast mass.  No nausea, vomiting or diarrhea. Rest of the pertinent 10 point ROS reviewed and negative  Patient Active Problem List   Diagnosis Date Noted   Port-A-Cath in place 10/24/2022   Genetic testing 10/16/2022   Malignant neoplasm of upper-outer quadrant of right female breast 10/06/2022   Thrombocytopenia 03/05/2022   Leukopenia 03/05/2022   Hot flashes  02/28/2022   Hair thinning 02/28/2022   Amenorrhea 02/28/2022   Screening for colon cancer 02/28/2022   Multiple nevi 02/28/2022   PTSD (post-traumatic stress disorder) 02/08/2021   Mild episode of recurrent major depressive disorder 09/12/2013   Attention deficit disorder 04/25/2013   Migraine 02/05/2013   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  MEDICAL HISTORY: Past Medical History:  Diagnosis Date   Anxiety    Breast cancer (HCC)    Depression    Hx of febrile seizure    1980    SURGICAL HISTORY: Past Surgical History:  Procedure Laterality Date   BREAST BIOPSY Right 09/30/2022   US RT BREAST BX W LOC DEV 1ST LESION IMG BX SPEC US GUIDE 09/30/2022 [facility] MAMMOGRAPHY   PORTACATH PLACEMENT N/A 10/23/2022   Procedure: INSERTION PORT-A-CATH;  Surgeon: Abigail MiyamotoBlackman, Douglas, MD;  Location: [facility];  Service: General;  Laterality: N/A;    SOCIAL HISTORY: Social History   Socioeconomic History   Marital status: Single    Spouse name: Not on file   Number of children: Not on file   Years of education: Not on file   Highest education level: Not on file  Occupational History   Not on file  Tobacco Use   Smoking status: Never   Smokeless tobacco: Never  Vaping Use   Vaping Use: Never used  Substance and Sexual Activity   Alcohol use: Yes    Comment: social   Drug use: No   Sexual activity: Not Currently    Partners: Male  Other Topics Concern   Not on file  Social History Narrative   Not on file   Social Determinants of Health   Financial Resource Strain: High Risk (10/08/2022)   Overall Financial Resource Strain (CARDIA)    Difficulty of Paying Living Expenses: Hard  Food Insecurity: Food Insecurity Present (10/08/2022)   Hunger Vital Sign    Worried About Running Out of Food in the Last Year: Sometimes true    Ran Out of Food in the Last Year: Sometimes true  Transportation Needs: No Transportation Needs (10/08/2022)   PRAPARE - Administrator, Civil ServiceTransportation    Lack of Transportation (Medical): No    Lack of Transportation (Non-Medical): No  Physical Activity: Not on file  Stress: Not on file  Social Connections: Not on file   Intimate Partner Violence: Not on file    FAMILY HISTORY: Family History  Problem Relation Age of Onset   Breast cancer Sister [id number] 45- 49   Cancer Paternal Grandmother        unknown type    Review of Systems  Constitutional:  Positive for fatigue. Negative for appetite change, chills, fever and unexpected weight change.  HENT:   Negative for hearing loss, lump/mass and trouble swallowing.   Eyes:  Negative for eye problems and icterus.  Respiratory:  Negative for chest tightness, cough and shortness of breath.   Cardiovascular:  Negative for chest pain, leg swelling and palpitations.  Gastrointestinal:  Negative for abdominal distention, abdominal pain, constipation, diarrhea, nausea and vomiting.  Endocrine: Negative for hot flashes.  Genitourinary:  Negative for difficulty urinating.   Musculoskeletal:  Negative for arthralgias.  Skin:  Negative for itching and rash.  Neurological:  Positive for numbness. Negative for dizziness, extremity weakness and headaches.  Hematological:  Negative for adenopathy. Does not bruise/bleed  easily.  Psychiatric/Behavioral:  Negative for depression. The patient is not nervous/anxious.       PHYSICAL EXAMINATION  ECOG PERFORMANCE STATUS: 1 - Symptomatic but completely ambulatory  Vitals:   12/05/22 1145  BP: 130/62  Pulse: 70  Resp: 16  Temp: 97.9 F (36.6 C)  SpO2: 100%    Physical Exam Constitutional:      General: She is not in acute distress.    Appearance: Normal appearance. She is not toxic-appearing.  HENT:     Head: Normocephalic and atraumatic.  Eyes:     General: No scleral icterus. Cardiovascular:     Rate and Rhythm: Normal rate and regular rhythm.     Pulses: Normal pulses.     Heart sounds: Normal heart sounds.  Pulmonary:     Effort: Pulmonary effort is normal.     Breath sounds: Normal breath sounds.  Chest:       Comments: Right upper quadrant mass smaller compared to previous visit.  No regional  adenopathy. Abdominal:     General: Abdomen is flat. Bowel sounds are normal. There is no distension.     Palpations: Abdomen is soft.     Tenderness: There is no abdominal tenderness.  Musculoskeletal:        General: No swelling.     Cervical back: Neck supple.  Lymphadenopathy:     Cervical: No cervical adenopathy.  Skin:    General: Skin is warm and dry.     Findings: No rash.  Neurological:     General: No focal deficit present.     Mental Status: She is alert.  Psychiatric:        Mood and Affect: Mood normal.        Behavior: Behavior normal.     LABORATORY DATA:  CBC    Component Value Date/Time   WBC 3.0 (L) 12/05/2022 1121   WBC 2.9 (L) 02/28/2022 1049   RBC 3.74 (L) 12/05/2022 1121   HGB 11.0 (L) 12/05/2022 1121   HCT 32.8 (L) 12/05/2022 1121   PLT 180 12/05/2022 1121   MCV 87.7 12/05/2022 1121   MCH 29.4 12/05/2022 1121   MCHC 33.5 12/05/2022 1121   RDW 14.3 12/05/2022 1121   LYMPHSABS 0.8 12/05/2022 1121   MONOABS 0.5 12/05/2022 1121   EOSABS 0.0 12/05/2022 1121   BASOSABS 0.0 12/05/2022 1121    CMP     Component Value Date/Time   NA 138 12/05/2022 1121   K 3.9 12/05/2022 1121   CL 104 12/05/2022 1121   CO2 29 12/05/2022 1121   GLUCOSE 106 (H) 12/05/2022 1121   BUN 14 12/05/2022 1121   CREATININE 0.73 12/05/2022 1121   CALCIUM 9.7 12/05/2022 1121   PROT 7.5 12/05/2022 1121   ALBUMIN 4.2 12/05/2022 1121   AST 19 12/05/2022 1121   ALT 18 12/05/2022 1121   ALKPHOS 75 12/05/2022 1121   BILITOT 0.3 12/05/2022 1121   GFRNONAA >60 12/05/2022 1121   GFRAA >60 12/23/2015 1151        ASSESSMENT and THERAPY PLAN:   Malignant neoplasm of upper-outer quadrant of right female breast (HCC) This is a very pleasant 52 year old female patient with no significant past medical history, significant family history of breast cancer in sister who died at the age of 77 from metastatic breast cancer referred to [facility] with a new palpable lump  biopsy-proven invasive carcinoma, high-grade, ER staining, PR negative and HER2 negative with a high proliferation index.  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  We have discussed about considering keynote 522 which consists of combination chemotherapy/immunotherapy.   Although lymph node was thought to be suspicious on the initial imaging, this was not seen on the ultrasound to consider biopsy hence it was not biopsied.  She is now on chemotherapy per KEYNOTE 522.  Right upper outer quadrant breast mass definitely smaller compared to last visit consistent with response.  She however has noted severe tingling numbness, burning pain in her feet, tingling and numbness which lasted for the whole day for the past couple days.  Given the persistent neuropathy symptoms, we have discussed about decreasing the dose of paclitaxel versus omitting it.  Patient would like to try reduced dose especially since she is having good clinical response.  I think this is a reasonable option.  She will return to clinic before her next planned cycle of chemotherapy.  Other than the neuropathy, she has some grade 1 fatigue.  No other concerning symptoms or physical examination findings.   All questions were answered. The patient knows to call the clinic with any problems, questions or concerns. We can certainly see the patient much sooner if necessary.  Total encounter time:30 minutes*in face-to-face visit time, chart review, lab review, care coordination, order entry, and documentation of the encounter time.    *Total Encounter Time as defined by the Centers for Medicare and Medicaid Services includes, in addition to the face-to-face time of a patient visit (documented in the note above) non-face-to-face time: obtaining and reviewing outside history, ordering and reviewing medications, tests or procedures, care coordination  (communications with other health care professionals or caregivers) and documentation in the medical record.

## 2022-12-05 NOTE — Assessment & Plan Note (Signed)
This is a very pleasant 52 year old female patient with no significant past medical history, significant family history of breast cancer in sister who died at the age of 28 from metastatic breast cancer referred to [facility] with a new palpable lump biopsy-proven invasive carcinoma, high-grade, ER staining, PR negative and HER2 negative with a high proliferation index.  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  We have discussed about considering keynote 522 which consists of combination chemotherapy/immunotherapy.   Although lymph node was thought to be suspicious on the initial imaging, this was not seen on the ultrasound to consider biopsy hence it was not biopsied.  She is now on chemotherapy per KEYNOTE 522.  Right upper outer quadrant breast mass definitely smaller compared to last visit consistent with response.  She however has noted severe tingling numbness, burning pain in her feet, tingling and numbness which lasted for the whole day for the past couple days.  Given the persistent neuropathy symptoms, we have discussed about decreasing the dose of paclitaxel versus omitting it.  Patient would like to try reduced dose especially since she is having good clinical response.  I think this is a reasonable option.  She will return to clinic before her next planned cycle of chemotherapy.  Other than the neuropathy, she has some grade 1 fatigue.  No other concerning symptoms or physical examination findings.

## 2022-12-05 NOTE — Patient Instructions (Signed)
[facility]  Discharge Instructions: Thank you for choosing [facility] to provide your oncology and hematology care.   If you have a lab appointment with the Cancer Center, please go directly to the Cancer Center and check in at the registration area.   Wear comfortable clothing and clothing appropriate for easy access to any Portacath or PICC line.   We strive to give you quality time with your provider. You may need to reschedule your appointment if you arrive late (15 or more minutes).  Arriving late affects you and other patients whose appointments are after yours.  Also, if you miss three or more appointments without notifying the office, you may be dismissed from the clinic at the provider's discretion.      For prescription refill requests, have your pharmacy contact our office and allow 72 hours for refills to be completed.    Today you received the following chemotherapy and/or immunotherapy agents: Paclitaxel, Carboplatin.       To help prevent nausea and vomiting after your treatment, we encourage you to take your nausea medication as directed.  BELOW ARE SYMPTOMS THAT SHOULD BE REPORTED IMMEDIATELY: *FEVER GREATER THAN 100.4 F (38 C) OR HIGHER *CHILLS OR SWEATING *NAUSEA AND VOMITING THAT IS NOT CONTROLLED WITH YOUR NAUSEA MEDICATION *UNUSUAL SHORTNESS OF BREATH *UNUSUAL BRUISING OR BLEEDING *URINARY PROBLEMS (pain or burning when urinating, or frequent urination) *BOWEL PROBLEMS (unusual diarrhea, constipation, pain near the anus) TENDERNESS IN MOUTH AND THROAT WITH OR WITHOUT PRESENCE OF ULCERS (sore throat, sores in mouth, or a toothache) UNUSUAL RASH, SWELLING OR PAIN  UNUSUAL VAGINAL DISCHARGE OR ITCHING   Items with * indicate a potential emergency and should be followed up as soon as possible or go to the Emergency Department if any problems should occur.  Please show the CHEMOTHERAPY ALERT CARD or IMMUNOTHERAPY  ALERT CARD at check-in to the Emergency Department and triage nurse.  Should you have questions after your visit or need to cancel or reschedule your appointment, please contact [facility]  Dept: [phone]  and follow the prompts.  Office hours are 8:00 a.m. to 4:30 p.m. Monday - Friday. Please note that voicemails left after 4:00 p.m. may not be returned until the following business day.  We are closed weekends and major holidays. You have access to a nurse at all times for urgent questions. Please call the main number to the clinic Dept: [phone] and follow the prompts.   For any non-urgent questions, you may also contact your provider using MyChart. We now offer e-Visits for anyone 18 and older to request care online for non-urgent symptoms. For details visit [email or web address].   Also download the MyChart app! Go to the app store, search "MyChart", open the app, select [facility], and log in with your MyChart username and password.   

## 2022-12-06 ENCOUNTER — Inpatient Hospital Stay: Payer: Medicare HMO

## 2022-12-08 ENCOUNTER — Inpatient Hospital Stay: Payer: Medicare HMO | Admitting: Licensed Clinical Social Worker

## 2022-12-08 ENCOUNTER — Inpatient Hospital Stay: Payer: Medicare HMO

## 2022-12-08 VITALS — BP 120/66 | HR 71 | Temp 98.6°F | Resp 20

## 2022-12-08 DIAGNOSIS — Z170 Estrogen receptor positive status [ER+]: Secondary | ICD-10-CM | POA: Diagnosis not present

## 2022-12-08 DIAGNOSIS — Z5111 Encounter for antineoplastic chemotherapy: Secondary | ICD-10-CM | POA: Diagnosis not present

## 2022-12-08 DIAGNOSIS — C50411 Malignant neoplasm of upper-outer quadrant of right female breast: Secondary | ICD-10-CM

## 2022-12-08 DIAGNOSIS — Z79899 Other long term (current) drug therapy: Secondary | ICD-10-CM | POA: Diagnosis not present

## 2022-12-08 MED ORDER — FILGRASTIM-SNDZ 480 MCG/0.8ML IJ SOSY
480.0000 ug | PREFILLED_SYRINGE | Freq: Once | INTRAMUSCULAR | Status: AC
  Administered 2022-12-08: 480 ug via SUBCUTANEOUS
  Filled 2022-12-08: qty 0.8

## 2022-12-08 NOTE — Progress Notes (Signed)
[facility] CSW Progress Note  Clinical Child psychotherapist  received TC from pt  requesting provider verification for [facility] application as well as a bag of food. Bag from pantry brought to front desk for pt.  CSW submitted [facility] provider verification form online and confirmed submission with pt.    Saveah Bahar E Melodi Happel, LCSW

## 2022-12-09 ENCOUNTER — Inpatient Hospital Stay: Payer: Medicare HMO

## 2022-12-09 VITALS — BP 117/75 | HR 80 | Temp 98.6°F | Resp 18

## 2022-12-09 DIAGNOSIS — C50411 Malignant neoplasm of upper-outer quadrant of right female breast: Secondary | ICD-10-CM

## 2022-12-09 DIAGNOSIS — Z170 Estrogen receptor positive status [ER+]: Secondary | ICD-10-CM | POA: Diagnosis not present

## 2022-12-09 DIAGNOSIS — Z5111 Encounter for antineoplastic chemotherapy: Secondary | ICD-10-CM | POA: Diagnosis not present

## 2022-12-09 DIAGNOSIS — Z79899 Other long term (current) drug therapy: Secondary | ICD-10-CM | POA: Diagnosis not present

## 2022-12-09 MED ORDER — FILGRASTIM-SNDZ 480 MCG/0.8ML IJ SOSY
480.0000 ug | PREFILLED_SYRINGE | Freq: Once | INTRAMUSCULAR | Status: AC
  Administered 2022-12-09: 480 ug via SUBCUTANEOUS
  Filled 2022-12-09: qty 0.8

## 2022-12-09 NOTE — Patient Instructions (Signed)
Filgrastim Injection What is this medication? FILGRASTIM (fil GRA stim) lowers the risk of infection in people who are receiving chemotherapy. It works by helping your body make more white blood cells, which protects your body from infection. It may also be used to help people who have been exposed to high doses of radiation. It can be used to help prepare your body before a stem cell transplant. It works by helping your bone marrow make and release stem cells into the blood. This medicine may be used for other purposes; ask your health care provider or pharmacist if you have questions. COMMON BRAND NAME(S): Neupogen, Nivestym, Releuko, Zarxio What should I tell my care team before I take this medication? They need to know if you have any of these conditions: History of blood diseases, such as sickle cell anemia Kidney disease Recent or ongoing radiation An unusual or allergic reaction to filgrastim, pegfilgrastim, latex, rubber, other medications, foods, dyes, or preservatives Pregnant or trying to get pregnant Breast-feeding How should I use this medication? This medication is injected under the skin or into a vein. It is usually given by your care team in a hospital or clinic setting. It may be given at home. If you get this medication at home, you will be taught how to prepare and give it. Use exactly as directed. Take it as directed on the prescription label at the same time every day. Keep taking it unless your care team tells you to stop. It is important that you put your used needles and syringes in a special sharps container. Do not put them in a trash can. If you do not have a sharps container, call your pharmacist or care team to get one. This medication comes with INSTRUCTIONS FOR USE. Ask your pharmacist for directions on how to use this medication. Read the information carefully. Talk to your pharmacist or care team if you have questions. Talk to your care team about the use of this  medication in children. While it may be prescribed for children for selected conditions, precautions do apply. Overdosage: If you think you have taken too much of this medicine contact a poison control center or emergency room at once. NOTE: This medicine is only for you. Do not share this medicine with others. What if I miss a dose? It is important not to miss any doses. Talk to your care team about what to do if you miss a dose. What may interact with this medication? Medications that may cause a release of neutrophils, such as lithium This list may not describe all possible interactions. Give your health care provider a list of all the medicines, herbs, non-prescription drugs, or dietary supplements you use. Also tell them if you smoke, drink alcohol, or use illegal drugs. Some items may interact with your medicine. What should I watch for while using this medication? Your condition will be monitored carefully while you are receiving this medication. You may need bloodwork while taking this medication. Talk to your care team about your risk of cancer. You may be more at risk for certain types of cancer if you take this medication. What side effects may I notice from receiving this medication? Side effects that you should report to your care team as soon as possible: Allergic reactions--skin rash, itching, hives, swelling of the face, lips, tongue, or throat Capillary leak syndrome--stomach or muscle pain, unusual weakness or fatigue, feeling faint or lightheaded, decrease in the amount of urine, swelling of the ankles, hands, or   feet, trouble breathing High white blood cell level--fever, fatigue, trouble breathing, night sweats, change in vision, weight loss Inflammation of the aorta--fever, fatigue, back, chest, or stomach pain, severe headache Kidney injury (glomerulonephritis)--decrease in the amount of urine, red or dark brown urine, foamy or bubbly urine, swelling of the ankles, hands, or  feet Shortness of breath or trouble breathing Spleen injury--pain in upper left stomach or shoulder Unusual bruising or bleeding Side effects that usually do not require medical attention (report to your care team if they continue or are bothersome): Back pain Bone pain Fatigue Fever Headache Nausea This list may not describe all possible side effects. Call your doctor for medical advice about side effects. You may report side effects to FDA at 1-800-FDA-1088. Where should I keep my medication? Keep out of the reach of children and pets. Keep this medication in the original packaging until you are ready to take it. Protect from light. See product for storage information. Each product may have different instructions. Get rid of any unused medication after the expiration date. To get rid of medications that are no longer needed or have expired: Take the medication to a medications take-back program. Check with your pharmacy or law enforcement to find a location. If you cannot return the medication, ask your pharmacist or care team how to get rid of this medication safely. NOTE: This sheet is a summary. It may not cover all possible information. If you have questions about this medicine, talk to your doctor, pharmacist, or health care provider.  2023 Elsevier/Gold Standard (2021-11-26 00:00:00)  

## 2022-12-10 ENCOUNTER — Inpatient Hospital Stay: Payer: Medicare HMO

## 2022-12-10 VITALS — BP 111/40 | HR 76 | Temp 98.7°F | Resp 18

## 2022-12-10 DIAGNOSIS — Z79899 Other long term (current) drug therapy: Secondary | ICD-10-CM | POA: Diagnosis not present

## 2022-12-10 DIAGNOSIS — C50411 Malignant neoplasm of upper-outer quadrant of right female breast: Secondary | ICD-10-CM

## 2022-12-10 DIAGNOSIS — Z5111 Encounter for antineoplastic chemotherapy: Secondary | ICD-10-CM | POA: Diagnosis not present

## 2022-12-10 DIAGNOSIS — Z170 Estrogen receptor positive status [ER+]: Secondary | ICD-10-CM | POA: Diagnosis not present

## 2022-12-10 MED ORDER — FILGRASTIM-SNDZ 480 MCG/0.8ML IJ SOSY
480.0000 ug | PREFILLED_SYRINGE | Freq: Once | INTRAMUSCULAR | Status: AC
  Administered 2022-12-10: 480 ug via SUBCUTANEOUS
  Filled 2022-12-10: qty 0.8

## 2022-12-11 MED FILL — Dexamethasone Sodium Phosphate Inj 100 MG/10ML: INTRAMUSCULAR | Qty: 1 | Status: AC

## 2022-12-12 ENCOUNTER — Inpatient Hospital Stay: Payer: Medicare HMO

## 2022-12-12 ENCOUNTER — Encounter: Payer: Self-pay | Admitting: Adult Health

## 2022-12-12 ENCOUNTER — Inpatient Hospital Stay (HOSPITAL_BASED_OUTPATIENT_CLINIC_OR_DEPARTMENT_OTHER): Payer: Medicare HMO | Admitting: Adult Health

## 2022-12-12 ENCOUNTER — Other Ambulatory Visit: Payer: Self-pay | Admitting: Hematology and Oncology

## 2022-12-12 DIAGNOSIS — Z5111 Encounter for antineoplastic chemotherapy: Secondary | ICD-10-CM | POA: Diagnosis not present

## 2022-12-12 DIAGNOSIS — C50411 Malignant neoplasm of upper-outer quadrant of right female breast: Secondary | ICD-10-CM

## 2022-12-12 DIAGNOSIS — Z79899 Other long term (current) drug therapy: Secondary | ICD-10-CM | POA: Diagnosis not present

## 2022-12-12 DIAGNOSIS — Z170 Estrogen receptor positive status [ER+]: Secondary | ICD-10-CM | POA: Diagnosis not present

## 2022-12-12 DIAGNOSIS — Z95828 Presence of other vascular implants and grafts: Secondary | ICD-10-CM

## 2022-12-12 LAB — CBC WITH DIFFERENTIAL (CANCER CENTER ONLY)
Abs Immature Granulocytes: 0.13 10*3/uL — ABNORMAL HIGH (ref 0.00–0.07)
Basophils Absolute: 0.1 10*3/uL (ref 0.0–0.1)
Basophils Relative: 1 %
Eosinophils Absolute: 0.1 10*3/uL (ref 0.0–0.5)
Eosinophils Relative: 1 %
HCT: 32 % — ABNORMAL LOW (ref 36.0–46.0)
Hemoglobin: 10.7 g/dL — ABNORMAL LOW (ref 12.0–15.0)
Immature Granulocytes: 1 %
Lymphocytes Relative: 12 %
Lymphs Abs: 1.2 10*3/uL (ref 0.7–4.0)
MCH: 29.3 pg (ref 26.0–34.0)
MCHC: 33.4 g/dL (ref 30.0–36.0)
MCV: 87.7 fL (ref 80.0–100.0)
Monocytes Absolute: 1 10*3/uL (ref 0.1–1.0)
Monocytes Relative: 10 %
Neutro Abs: 7.3 10*3/uL (ref 1.7–7.7)
Neutrophils Relative %: 75 %
Platelet Count: 117 10*3/uL — ABNORMAL LOW (ref 150–400)
RBC: 3.65 MIL/uL — ABNORMAL LOW (ref 3.87–5.11)
RDW: 14.4 % (ref 11.5–15.5)
Smear Review: NORMAL
WBC Count: 9.8 10*3/uL (ref 4.0–10.5)
nRBC: 0 % (ref 0.0–0.2)

## 2022-12-12 LAB — CMP (CANCER CENTER ONLY)
ALT: 17 U/L (ref 0–44)
AST: 18 U/L (ref 15–41)
Albumin: 4.1 g/dL (ref 3.5–5.0)
Alkaline Phosphatase: 110 U/L (ref 38–126)
Anion gap: 4 — ABNORMAL LOW (ref 5–15)
BUN: 13 mg/dL (ref 6–20)
CO2: 32 mmol/L (ref 22–32)
Calcium: 9.7 mg/dL (ref 8.9–10.3)
Chloride: 103 mmol/L (ref 98–111)
Creatinine: 0.68 mg/dL (ref 0.44–1.00)
GFR, Estimated: >60 mL/min (ref >60)
Glucose, Bld: 127 mg/dL — ABNORMAL HIGH (ref 70–99)
Potassium: 4 mmol/L (ref 3.5–5.1)
Sodium: 139 mmol/L (ref 135–145)
Total Bilirubin: 0.2 mg/dL — ABNORMAL LOW (ref 0.3–1.2)
Total Protein: 7.5 g/dL (ref 6.5–8.1)

## 2022-12-12 LAB — TSH: TSH: 0.102 u[IU]/mL — ABNORMAL LOW (ref 0.350–4.500)

## 2022-12-12 LAB — PREGNANCY, URINE: Preg Test, Ur: NEGATIVE

## 2022-12-12 MED ORDER — FAMOTIDINE IN NACL 20-0.9 MG/50ML-% IV SOLN: 20.0000 mg | Freq: Once | INTRAVENOUS | Status: DC

## 2022-12-12 MED ORDER — SODIUM CHLORIDE 0.9% FLUSH
10.0000 mL | Freq: Once | INTRAVENOUS | Status: AC
  Administered 2022-12-12: 10 mL

## 2022-12-12 MED ORDER — SODIUM CHLORIDE 0.9 % IV SOLN: Freq: Once | INTRAVENOUS | Status: AC

## 2022-12-12 MED ORDER — PALONOSETRON HCL INJECTION 0.25 MG/5ML
0.2500 mg | Freq: Once | INTRAVENOUS | Status: AC
  Administered 2022-12-12: 0.25 mg via INTRAVENOUS
  Filled 2022-12-12: qty 5

## 2022-12-12 MED ORDER — SODIUM CHLORIDE 0.9 % IV SOLN
610.5000 mg | Freq: Once | INTRAVENOUS | Status: AC
  Administered 2022-12-12: 610.5 mg via INTRAVENOUS
  Filled 2022-12-12: qty 61.05

## 2022-12-12 MED ORDER — SODIUM CHLORIDE 0.9 % IV SOLN
200.0000 mg | Freq: Once | INTRAVENOUS | Status: AC
  Administered 2022-12-12: 200 mg via INTRAVENOUS
  Filled 2022-12-12: qty 200

## 2022-12-12 MED ORDER — DIPHENHYDRAMINE HCL 50 MG/ML IJ SOLN: 50.0000 mg | Freq: Once | INTRAMUSCULAR | Status: DC

## 2022-12-12 MED ORDER — SODIUM CHLORIDE 0.9 % IV SOLN
10.0000 mg | Freq: Once | INTRAVENOUS | Status: AC
  Administered 2022-12-12: 10 mg via INTRAVENOUS
  Filled 2022-12-12: qty 10

## 2022-12-12 NOTE — Progress Notes (Signed)
Taxol discontinued.

## 2022-12-12 NOTE — Assessment & Plan Note (Signed)
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  She is here for week 7 of her therapy.  She is due for Taxol [place].  Due to her worsening neuropathy she will not receive any more paclitaxel.  I reviewed this in detail with Dr. Al Pimple who remove this from her treatment plan.  She also changed her carboplatin to AUC of 5 for her to receive every 3 weeks.  [name] will receive Keytruda and carboplatin today and again in 3 weeks with labs and follow-up prior.  I reviewed this change with Harlym in detail and she verbalized understanding of this.  We will see her back in 3 weeks for labs and follow-up.

## 2022-12-12 NOTE — Progress Notes (Signed)
[facility] Cancer Follow up:    Maria Shackleton, NP-C [address] [place] [address]   DIAGNOSIS:  Cancer Staging  Malignant neoplasm of upper-outer quadrant of right female breast Staging form: Breast, AJCC 8th Edition - Clinical: Stage IIB (cT2, cN0, cM0, G3, ER+, PR-, HER2-) - Signed by [name], MD on 10/08/2022 Stage prefix: Initial diagnosis Histologic grading system: 3 grade system - Clinical: No stage assigned - Unsigned   SUMMARY OF ONCOLOGIC HISTORY: Oncology History  Malignant neoplasm of upper-outer quadrant of right female breast  09/26/2022 Mammogram   Patient had a palpable right breast mass and hence underwent bilateral diagnostic mammogram.  This confirmed a suspicious 3 cm right upper outer quadrant mass and an abnormal right axillary lymph node with cortical thickening.  Tissue sampling of both the breast mass and axillary lymph node were recommended.  Left breast was normal.   09/26/2022 Breast US   Ultrasound breast confirmed the above-mentioned findings.   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  Prognostic showed ER 50% positive weak staining PR 0% negative HER2 1+ by IHC and Ki-67 of 70%   10/06/2022 Initial Diagnosis   Malignant neoplasm of upper-outer quadrant of right female breast (HCC)   10/08/2022 Cancer Staging   Staging form: Breast, AJCC 8th Edition - Clinical: Stage IIB (cT2, cN0, cM0, G3, ER+, PR-, HER2-) - Signed by [name], MD on 10/08/2022 Stage prefix: Initial diagnosis Histologic grading system: 3 grade system   10/24/2022 - 11/14/2022 Chemotherapy   Patient is on Treatment Plan : BREAST Pembrolizumab (200) D1 + Carboplatin (5) D1 + Paclitaxel (80) D1,8,15 q21d X 4 cycles / Pembrolizumab (200) D1 + AC D1 q21d x 4 cycles      Genetic Testing   [facility] Common Cancer Panel+RNA was Negative. Report date is 10/16/2022.  The Common Hereditary  Cancers Panel offered by [facility] includes sequencing and/or deletion duplication testing of the following 48 genes: APC, ATM, AXIN2, BAP1, BARD1, BMPR1A, BRCA1, BRCA2, BRIP1, CDH1, CDK4, CDKN2A (p14ARF and p16INK4a only), CHEK2, CTNNA1, DICER1, EPCAM (Deletion/duplication testing only), FH, GREM1 (promoter region duplication testing only), HOXB13, KIT, MBD4, MEN1, MLH1, MSH2, MSH3, MSH6, MUTYH, NF1, NHTL1, PALB2, PDGFRA, PMS2, POLD1, POLE, PTEN, RAD51C, RAD51D, SDHA (sequencing analysis only except exon 14), SDHB, SDHC, SDHD, SMAD4, SMARCA4. STK11, TP53, TSC1, TSC2, and VHL.   11/21/2022 -  Chemotherapy   Patient is on Treatment Plan : BREAST Pembrolizumab (200) D1 + Carboplatin (1.5) D1,8,15 + Paclitaxel (80) D1,8,15 q21d X 4 cycles / Pembrolizumab (200) D1 + AC D1 q21d x 4 cycles       CURRENT THERAPY: Pembrolizumab, Paclitaxel, Carboplatin  INTERVAL HISTORY: Maria Carter 52 y.o. female returns for Week 7 of her Pembrolizumab, given once every 3 weeks, and Paclitaxel/Carbo--given weekly.  Peripheral neuropathy extending down entire foot, worse last week than now.  She notes it began with week 3 or 4.  She was dose reduced with her last cycle of treatment.  She has numbness in the toes, intermittent numbness.  Pain down entire foot.  It is impacting her ability to walk like she normally did.  She also notes that she has had neuropathy in her fingertips as tingling and burning.  She notes that it is intermittent.    She wants a disability placard for her car due to parking distance to get into her work.  Her daughter is graduating at the end of May  and she wants to make sure she can be her best and travel.  She does not have the dates yet for graduation.     Patient Active Problem List   Diagnosis Date Noted   Port-A-Cath in place 10/24/2022   Genetic testing 10/16/2022   Malignant neoplasm of upper-outer quadrant of right female breast 10/06/2022   Thrombocytopenia 03/05/2022   Leukopenia  03/05/2022   Hot flashes 02/28/2022   Hair thinning 02/28/2022   Amenorrhea 02/28/2022   Screening for colon cancer 02/28/2022   Multiple nevi 02/28/2022   PTSD (post-traumatic stress disorder) 02/08/2021   Mild episode of recurrent major depressive disorder 09/12/2013   Attention deficit disorder 04/25/2013   Migraine 02/05/2013   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  MEDICAL HISTORY: Past Medical History:  Diagnosis Date   Anxiety    Breast cancer    Depression    Hx of febrile seizure    1980    SURGICAL HISTORY: Past Surgical History:  Procedure Laterality Date   BREAST BIOPSY Right 09/30/2022   [place] RT BREAST BX W LOC DEV 1ST LESION IMG BX SPEC US GUIDE 09/30/2022 [facility] MAMMOGRAPHY   PORTACATH PLACEMENT N/A 10/23/2022   Procedure: INSERTION PORT-A-CATH;  Surgeon: [name], MD;  Location: [facility];  Service: General;  Laterality: N/A;    SOCIAL HISTORY: Social History   Socioeconomic History   Marital status: Single    Spouse name: Not on file   Number of children: Not on file   Years of education: Not on file   Highest education level: Not on file  Occupational History   Not on file  Tobacco Use   Smoking status: Never   Smokeless tobacco: Never  Vaping Use   Vaping Use: Never used  Substance and Sexual Activity   Alcohol use: Yes    Comment: social   Drug use: No   Sexual activity: Not Currently    Partners: Male  Other Topics Concern   Not on file  Social History Narrative   Not on file   Social Determinants of Health   Financial Resource Strain: High Risk (10/08/2022)   Overall Financial Resource Strain (CARDIA)    Difficulty of Paying Living Expenses: Hard  Food Insecurity: Food Insecurity Present (10/08/2022)   Hunger Vital Sign    Worried About Running Out of Food in  the Last Year: Sometimes true    Ran Out of Food in the Last Year: Sometimes true  Transportation Needs: No Transportation Needs (10/08/2022)   PRAPARE - Administrator, Civil Service (Medical): No    Lack of Transportation (Non-Medical): No  Physical Activity: Not on file  Stress: Not on file  Social Connections: Not on file  Intimate Partner Violence: Not on file    FAMILY HISTORY: Family History  Problem Relation Age of Onset   Breast cancer Sister 79 - 9   Cancer Paternal Grandmother        unknown type    Review of Systems  Constitutional:  Negative for appetite change, chills, fatigue, fever and unexpected weight change.  HENT:   Negative for hearing loss, lump/mass and trouble swallowing.   Eyes:  Negative for eye problems and icterus.  Respiratory:  Negative for chest tightness, cough and shortness of breath.   Cardiovascular:  Negative for chest pain,  leg swelling and palpitations.  Gastrointestinal:  Negative for abdominal distention, abdominal pain, constipation, diarrhea, nausea and vomiting.  Endocrine: Negative for hot flashes.  Genitourinary:  Negative for difficulty urinating.   Musculoskeletal:  Negative for arthralgias.  Skin:  Negative for itching and rash.  Neurological:  Positive for numbness. Negative for dizziness, extremity weakness and headaches.  Hematological:  Negative for adenopathy. Does not bruise/bleed easily.  Psychiatric/Behavioral:  Negative for depression. The patient is not nervous/anxious.       PHYSICAL EXAMINATION   Onc Performance Status - 12/12/22 1032       ECOG Perf Status   ECOG Perf Status Fully active, able to carry on all pre-disease performance without restriction      KPS SCALE   KPS % SCORE Normal, no compliants, no evidence of disease             Vitals:   12/12/22 1017  BP: (!) 105/52  Pulse: 72  Resp: 16  Temp: 97.7 F (36.5 C)  SpO2: 100%    Physical Exam Constitutional:      General:  She is not in acute distress.    Appearance: Normal appearance. She is not toxic-appearing.  HENT:     Head: Normocephalic and atraumatic.  Eyes:     General: No scleral icterus. Cardiovascular:     Rate and Rhythm: Normal rate and regular rhythm.     Pulses: Normal pulses.     Heart sounds: Normal heart sounds.  Pulmonary:     Effort: Pulmonary effort is normal.     Breath sounds: Normal breath sounds.  Abdominal:     General: Abdomen is flat. Bowel sounds are normal. There is no distension.     Palpations: Abdomen is soft.     Tenderness: There is no abdominal tenderness.  Musculoskeletal:        General: No swelling.     Cervical back: Neck supple.  Lymphadenopathy:     Cervical: No cervical adenopathy.  Skin:    General: Skin is warm and dry.     Findings: No rash.  Neurological:     General: No focal deficit present.     Mental Status: She is alert.  Psychiatric:        Mood and Affect: Mood normal.        Behavior: Behavior normal.     LABORATORY DATA:  CBC    Component Value Date/Time   WBC 9.8 12/12/2022 0952   WBC 2.9 (L) 02/28/2022 1049   RBC 3.65 (L) 12/12/2022 0952   HGB 10.7 (L) 12/12/2022 0952   HCT 32.0 (L) 12/12/2022 0952   PLT 117 (L) 12/12/2022 0952   MCV 87.7 12/12/2022 0952   MCH 29.3 12/12/2022 0952   MCHC 33.4 12/12/2022 0952   RDW 14.4 12/12/2022 0952   LYMPHSABS 1.2 12/12/2022 0952   MONOABS 1.0 12/12/2022 0952   EOSABS 0.1 12/12/2022 0952   BASOSABS 0.1 12/12/2022 0952    CMP     Component Value Date/Time   NA 139 12/12/2022 0952   K 4.0 12/12/2022 0952   CL 103 12/12/2022 0952   CO2 32 12/12/2022 0952   GLUCOSE 127 (H) 12/12/2022 0952   BUN 13 12/12/2022 0952   CREATININE 0.68 12/12/2022 0952   CALCIUM 9.7 12/12/2022 0952   PROT 7.5 12/12/2022 0952   ALBUMIN 4.1 12/12/2022 0952   AST 18 12/12/2022 0952   ALT 17 12/12/2022 0952   ALKPHOS 110 12/12/2022 0952   BILITOT 0.2 (L)  12/12/2022 0952   GFRNONAA >60 12/12/2022 0952    GFRAA >60 12/23/2015 1151       ASSESSMENT and THERAPY PLAN:   Malignant neoplasm of upper-outer quadrant of right female breast (HCC) Maria Carter is a 52 year old woman with 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  She is here for week 7 of her therapy.  She is due for Taxol [place].  Due to her worsening neuropathy she will not receive any more paclitaxel.  I reviewed this in detail with Dr. Al Pimple who remove this from her treatment plan.  She also changed her carboplatin to AUC of 5 for her to receive every 3 weeks.  Maria Carter will receive Keytruda and carboplatin today and again in 3 weeks with labs and follow-up prior.  I reviewed this change with Maria Carter in detail and she verbalized understanding of this.  We will see her back in 3 weeks for labs and follow-up.   All questions were answered. The patient knows to call the clinic with any problems, questions or concerns. We can certainly see the patient much sooner if necessary.  Total encounter time:30 minutes*in face-to-face visit time, chart review, lab review, care coordination, order entry, and documentation of the encounter time.    Lillard Anes, NP 12/12/22 2:41 PM Medical Oncology and Hematology [facility] [address] [place], [place] [address] Tel. [phone]    Fax. [phone]  *Total Encounter Time as defined by the Centers for Medicare and Medicaid Services includes, in addition to the face-to-face time of a patient visit (documented in the note above) non-face-to-face time: obtaining and reviewing outside history, ordering and reviewing medications, tests or procedures, care coordination (communications with other health care professionals or caregivers) and documentation in the medical record.

## 2022-12-13 LAB — T4: T4, Total: 10 ug/dL (ref 4.5–12.0)

## 2022-12-14 ENCOUNTER — Other Ambulatory Visit: Payer: Self-pay

## 2022-12-16 ENCOUNTER — Other Ambulatory Visit: Payer: Self-pay

## 2022-12-19 ENCOUNTER — Inpatient Hospital Stay: Payer: Medicare HMO

## 2022-12-19 ENCOUNTER — Inpatient Hospital Stay: Payer: Medicare HMO | Admitting: Adult Health

## 2022-12-22 ENCOUNTER — Ambulatory Visit: Payer: Medicare HMO

## 2022-12-23 ENCOUNTER — Ambulatory Visit: Payer: Medicare HMO

## 2022-12-24 ENCOUNTER — Ambulatory Visit: Payer: Medicare HMO

## 2022-12-25 ENCOUNTER — Encounter: Payer: Self-pay | Admitting: *Deleted

## 2022-12-26 ENCOUNTER — Ambulatory Visit: Payer: Medicare HMO

## 2022-12-26 ENCOUNTER — Other Ambulatory Visit: Payer: Medicare HMO

## 2022-12-26 ENCOUNTER — Ambulatory Visit: Payer: Medicare HMO | Admitting: Hematology and Oncology

## 2022-12-27 ENCOUNTER — Ambulatory Visit: Payer: Medicare HMO

## 2022-12-29 ENCOUNTER — Ambulatory Visit: Payer: Medicare HMO

## 2022-12-30 ENCOUNTER — Ambulatory Visit: Payer: Medicare HMO

## 2023-01-02 MED FILL — Dexamethasone Sodium Phosphate Inj 100 MG/10ML: INTRAMUSCULAR | Qty: 1 | Status: AC

## 2023-01-05 ENCOUNTER — Encounter: Payer: Self-pay | Admitting: Adult Health

## 2023-01-05 ENCOUNTER — Inpatient Hospital Stay: Payer: Medicare HMO

## 2023-01-05 ENCOUNTER — Inpatient Hospital Stay: Payer: Medicare HMO | Attending: Physician Assistant

## 2023-01-05 ENCOUNTER — Encounter: Payer: Self-pay | Admitting: Hematology and Oncology

## 2023-01-05 ENCOUNTER — Inpatient Hospital Stay (HOSPITAL_BASED_OUTPATIENT_CLINIC_OR_DEPARTMENT_OTHER): Payer: Medicare HMO | Admitting: Adult Health

## 2023-01-05 VITALS — BP 118/72 | HR 71

## 2023-01-05 VITALS — BP 104/62 | HR 74 | Temp 97.9°F | Resp 18 | Ht 61.0 in | Wt 156.4 lb

## 2023-01-05 DIAGNOSIS — Z170 Estrogen receptor positive status [ER+]: Secondary | ICD-10-CM | POA: Insufficient documentation

## 2023-01-05 DIAGNOSIS — Z95828 Presence of other vascular implants and grafts: Secondary | ICD-10-CM

## 2023-01-05 DIAGNOSIS — C50411 Malignant neoplasm of upper-outer quadrant of right female breast: Secondary | ICD-10-CM | POA: Insufficient documentation

## 2023-01-05 DIAGNOSIS — Z79899 Other long term (current) drug therapy: Secondary | ICD-10-CM | POA: Diagnosis not present

## 2023-01-05 DIAGNOSIS — Z5111 Encounter for antineoplastic chemotherapy: Secondary | ICD-10-CM | POA: Diagnosis not present

## 2023-01-05 LAB — CBC WITH DIFFERENTIAL (CANCER CENTER ONLY)
Abs Immature Granulocytes: 0 10*3/uL (ref 0.00–0.07)
Basophils Absolute: 0 10*3/uL (ref 0.0–0.1)
Basophils Relative: 1 %
Eosinophils Absolute: 0 10*3/uL (ref 0.0–0.5)
Eosinophils Relative: 2 %
HCT: 27.8 % — ABNORMAL LOW (ref 36.0–46.0)
Hemoglobin: 9.3 g/dL — ABNORMAL LOW (ref 12.0–15.0)
Immature Granulocytes: 0 %
Lymphocytes Relative: 30 %
Lymphs Abs: 0.6 10*3/uL — ABNORMAL LOW (ref 0.7–4.0)
MCH: 30.3 pg (ref 26.0–34.0)
MCHC: 33.5 g/dL (ref 30.0–36.0)
MCV: 90.6 fL (ref 80.0–100.0)
Monocytes Absolute: 0.3 10*3/uL (ref 0.1–1.0)
Monocytes Relative: 13 %
Neutro Abs: 1.1 10*3/uL — ABNORMAL LOW (ref 1.7–7.7)
Neutrophils Relative %: 54 %
Platelet Count: 130 10*3/uL — ABNORMAL LOW (ref 150–400)
RBC: 3.07 MIL/uL — ABNORMAL LOW (ref 3.87–5.11)
RDW: 15.9 % — ABNORMAL HIGH (ref 11.5–15.5)
Smear Review: NORMAL
WBC Count: 2.1 10*3/uL — ABNORMAL LOW (ref 4.0–10.5)
nRBC: 0 % (ref 0.0–0.2)

## 2023-01-05 LAB — CMP (CANCER CENTER ONLY)
ALT: 12 U/L (ref 0–44)
AST: 15 U/L (ref 15–41)
Albumin: 3.9 g/dL (ref 3.5–5.0)
Alkaline Phosphatase: 67 U/L (ref 38–126)
Anion gap: 4 — ABNORMAL LOW (ref 5–15)
BUN: 16 mg/dL (ref 6–20)
CO2: 30 mmol/L (ref 22–32)
Calcium: 9.2 mg/dL (ref 8.9–10.3)
Chloride: 105 mmol/L (ref 98–111)
Creatinine: 0.65 mg/dL (ref 0.44–1.00)
GFR, Estimated: >60 mL/min (ref >60)
Glucose, Bld: 102 mg/dL — ABNORMAL HIGH (ref 70–99)
Potassium: 4.1 mmol/L (ref 3.5–5.1)
Sodium: 139 mmol/L (ref 135–145)
Total Bilirubin: 0.3 mg/dL (ref 0.3–1.2)
Total Protein: 7.1 g/dL (ref 6.5–8.1)

## 2023-01-05 LAB — PREGNANCY, URINE: Preg Test, Ur: NEGATIVE

## 2023-01-05 MED ORDER — HEPARIN SOD (PORK) LOCK FLUSH 100 UNIT/ML IV SOLN
500.0000 [IU] | Freq: Once | INTRAVENOUS | Status: AC | PRN
  Administered 2023-01-05: 500 [IU]

## 2023-01-05 MED ORDER — SODIUM CHLORIDE 0.9 % IV SOLN: Freq: Once | INTRAVENOUS | Status: AC

## 2023-01-05 MED ORDER — SODIUM CHLORIDE 0.9% FLUSH
10.0000 mL | Freq: Once | INTRAVENOUS | Status: AC
  Administered 2023-01-05: 10 mL

## 2023-01-05 MED ORDER — SODIUM CHLORIDE 0.9 % IV SOLN
200.0000 mg | Freq: Once | INTRAVENOUS | Status: AC
  Administered 2023-01-05: 200 mg via INTRAVENOUS
  Filled 2023-01-05: qty 200

## 2023-01-05 MED ORDER — SODIUM CHLORIDE 0.9% FLUSH
10.0000 mL | INTRAVENOUS | Status: DC | PRN
  Administered 2023-01-05: 10 mL

## 2023-01-05 MED ORDER — SODIUM CHLORIDE 0.9 % IV SOLN
610.5000 mg | Freq: Once | INTRAVENOUS | Status: AC
  Administered 2023-01-05: 610.5 mg via INTRAVENOUS
  Filled 2023-01-05: qty 61.05

## 2023-01-05 MED ORDER — PALONOSETRON HCL INJECTION 0.25 MG/5ML
0.2500 mg | Freq: Once | INTRAVENOUS | Status: AC
  Administered 2023-01-05: 0.25 mg via INTRAVENOUS
  Filled 2023-01-05: qty 5

## 2023-01-05 MED ORDER — SODIUM CHLORIDE 0.9 % IV SOLN
10.0000 mg | Freq: Once | INTRAVENOUS | Status: AC
  Administered 2023-01-05: 10 mg via INTRAVENOUS
  Filled 2023-01-05: qty 10

## 2023-01-05 NOTE — Progress Notes (Signed)
[facility] Cancer Follow up:    Maria Shackleton, NP-C [address] [place] [address]   DIAGNOSIS:  Cancer Staging  Malignant neoplasm of upper-outer quadrant of right female breast [facility]) Staging form: Breast, AJCC 8th Edition - Clinical: Stage IIB (cT2, cN0, cM0, G3, ER+, PR-, HER2-) - Signed by [name], MD on 10/08/2022 Stage prefix: Initial diagnosis Histologic grading system: 3 grade system - Clinical: No stage assigned - Unsigned   SUMMARY OF ONCOLOGIC HISTORY: Oncology History  Malignant neoplasm of upper-outer quadrant of right female breast (HCC)  09/26/2022 Mammogram   Patient had a palpable right breast mass and hence underwent bilateral diagnostic mammogram.  This confirmed a suspicious 3 cm right upper outer quadrant mass and an abnormal right axillary lymph node with cortical thickening.  Tissue sampling of both the breast mass and axillary lymph node were recommended.  Left breast was normal.   09/26/2022 Breast US   Ultrasound breast confirmed the above-mentioned findings.   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  Prognostic showed ER 50% positive weak staining PR 0% negative HER2 1+ by IHC and Ki-67 of 70%   10/06/2022 Initial Diagnosis   Malignant neoplasm of upper-outer quadrant of right female breast (HCC)   10/08/2022 Cancer Staging   Staging form: Breast, AJCC 8th Edition - Clinical: Stage IIB (cT2, cN0, cM0, G3, ER+, PR-, HER2-) - Signed by [name], MD on 10/08/2022 Stage prefix: Initial diagnosis Histologic grading system: 3 grade system   10/24/2022 - 11/14/2022 Chemotherapy   Patient is on Treatment Plan : BREAST Pembrolizumab (200) D1 + Carboplatin (5) D1 + Paclitaxel (80) D1,8,15 q21d X 4 cycles / Pembrolizumab (200) D1 + AC D1 q21d x 4 cycles      Genetic Testing   [facility] Common Cancer Panel+RNA was Negative. Report date is 10/16/2022.  The Common  Hereditary Cancers Panel offered by [facility] includes sequencing and/or deletion duplication testing of the following 48 genes: APC, ATM, AXIN2, BAP1, BARD1, BMPR1A, BRCA1, BRCA2, BRIP1, CDH1, CDK4, CDKN2A (p14ARF and p16INK4a only), CHEK2, CTNNA1, DICER1, EPCAM (Deletion/duplication testing only), FH, GREM1 (promoter region duplication testing only), HOXB13, KIT, MBD4, MEN1, MLH1, MSH2, MSH3, MSH6, MUTYH, NF1, NHTL1, PALB2, PDGFRA, PMS2, POLD1, POLE, PTEN, RAD51C, RAD51D, SDHA (sequencing analysis only except exon 14), SDHB, SDHC, SDHD, SMAD4, SMARCA4. STK11, TP53, TSC1, TSC2, and VHL.   11/21/2022 -  Chemotherapy   Patient is on Treatment Plan : BREAST Pembrolizumab (200) D1 + Carboplatin (1.5) D1,8,15 + Paclitaxel (80) D1,8,15 q21d X 4 cycles / Pembrolizumab (200) D1 + AC D1 q21d x 4 cycles       CURRENT THERAPY: Carbo/Keytruda  INTERVAL HISTORY: Maria Carter 52 y.o. female returns for follow-up and evaluation prior to receiving Maria Carter.  She is tolerating this moderately well.  Her neutrophil count is slightly decreased today and is 1.1.  She denies any new issues and has no significant side effects other than some mild fatigue.  Despite her fatigue she is still getting up and taking her 2 dogs to the dog park and exercising with them.  She feels like her breast tumor is shrinking and she notes that her daughter is graduating from high school in [place] on May 30.  She is going to travel to [place] from May 29 through June 1 and would like to wait until after she returns to begin her next cycle of chemotherapy.   Patient Active  Problem List   Diagnosis Date Noted   Port-A-Cath in place 10/24/2022   Genetic testing 10/16/2022   Malignant neoplasm of upper-outer quadrant of right female breast (HCC) 10/06/2022   Thrombocytopenia (HCC) 03/05/2022   Leukopenia 03/05/2022   Hot flashes 02/28/2022   Hair thinning 02/28/2022   Amenorrhea 02/28/2022   Screening for colon cancer  02/28/2022   Multiple nevi 02/28/2022   PTSD (post-traumatic stress disorder) 02/08/2021   Mild episode of recurrent major depressive disorder (HCC) 09/12/2013   Attention deficit disorder 04/25/2013   Migraine 02/05/2013   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  MEDICAL HISTORY: Past Medical History:  Diagnosis Date   Anxiety    Breast cancer (HCC)    Depression    Hx of febrile seizure    1980    SURGICAL HISTORY: Past Surgical History:  Procedure Laterality Date   BREAST BIOPSY Right 09/30/2022   [place] RT BREAST BX W LOC DEV 1ST LESION IMG BX SPEC US GUIDE 09/30/2022 [facility] MAMMOGRAPHY   PORTACATH PLACEMENT N/A 10/23/2022   Procedure: INSERTION PORT-A-CATH;  Surgeon: [name], MD;  Location: [facility];  Service: General;  Laterality: N/A;    SOCIAL HISTORY: Social History   Socioeconomic History   Marital status: Single    Spouse name: Not on file   Number of children: Not on file   Years of education: Not on file   Highest education level: Not on file  Occupational History   Not on file  Tobacco Use   Smoking status: Never   Smokeless tobacco: Never  Vaping Use   Vaping Use: Never used  Substance and Sexual Activity   Alcohol use: Yes    Comment: social   Drug use: No   Sexual activity: Not Currently    Partners: Male  Other Topics Concern   Not on file  Social History Narrative   Not on file   Social Determinants of Health   Financial Resource Strain: High Risk (10/08/2022)   Overall Financial Resource Strain (CARDIA)    Difficulty of Paying Living Expenses: Hard  Food Insecurity: Food Insecurity Present (10/08/2022)   Hunger Vital Sign    Worried About Running Out of Food in the Last Year: Sometimes true    Ran Out of Food in the Last Year: Sometimes true  Transportation Needs:  No Transportation Needs (10/08/2022)   PRAPARE - Administrator, Civil Service (Medical): No    Lack of Transportation (Non-Medical): No  Physical Activity: Not on file  Stress: Not on file  Social Connections: Not on file  Intimate Partner Violence: Not on file    FAMILY HISTORY: Family History  Problem Relation Age of Onset   Breast cancer Sister 21 - 74   Cancer Paternal Grandmother        unknown type    Review of Systems  Constitutional:  Positive for fatigue. Negative for appetite change, chills, fever and unexpected weight change.  HENT:   Negative for hearing loss, lump/mass and trouble swallowing.   Eyes:  Negative for eye problems and icterus.  Respiratory:  Negative for chest tightness, cough and shortness of breath.   Cardiovascular:  Negative for chest pain, leg swelling and palpitations.  Gastrointestinal:  Negative for abdominal distention, abdominal pain, constipation, diarrhea, nausea and vomiting.  Endocrine: Negative for hot  flashes.  Genitourinary:  Negative for difficulty urinating.   Musculoskeletal:  Negative for arthralgias.  Skin:  Negative for itching and rash.  Neurological:  Negative for dizziness, extremity weakness, headaches and numbness.  Hematological:  Negative for adenopathy. Does not bruise/bleed easily.  Psychiatric/Behavioral:  Negative for depression. The patient is not nervous/anxious.       PHYSICAL EXAMINATION    Vitals:   01/05/23 1112  BP: 104/62  Pulse: 74  Resp: 18  Temp: 97.9 F (36.6 C)  SpO2: 100%    Physical Exam Constitutional:      General: She is not in acute distress.    Appearance: Normal appearance. She is not toxic-appearing.  HENT:     Head: Normocephalic and atraumatic.  Eyes:     General: No scleral icterus. Cardiovascular:     Rate and Rhythm: Normal rate and regular rhythm.     Pulses: Normal pulses.     Heart sounds: Normal heart sounds.  Pulmonary:     Effort: Pulmonary effort is  normal.     Breath sounds: Normal breath sounds.  Abdominal:     General: Abdomen is flat. Bowel sounds are normal. There is no distension.     Palpations: Abdomen is soft.     Tenderness: There is no abdominal tenderness.  Musculoskeletal:        General: No swelling.     Cervical back: Neck supple.  Lymphadenopathy:     Cervical: No cervical adenopathy.  Skin:    General: Skin is warm and dry.     Findings: No rash.  Neurological:     General: No focal deficit present.     Mental Status: She is alert.  Psychiatric:        Mood and Affect: Mood normal.        Behavior: Behavior normal.     LABORATORY DATA:  CBC    Component Value Date/Time   WBC 2.1 (L) 01/05/2023 1033   WBC 2.9 (L) 02/28/2022 1049   RBC 3.07 (L) 01/05/2023 1033   HGB 9.3 (L) 01/05/2023 1033   HCT 27.8 (L) 01/05/2023 1033   PLT 130 (L) 01/05/2023 1033   MCV 90.6 01/05/2023 1033   MCH 30.3 01/05/2023 1033   MCHC 33.5 01/05/2023 1033   RDW 15.9 (H) 01/05/2023 1033   LYMPHSABS 0.6 (L) 01/05/2023 1033   MONOABS 0.3 01/05/2023 1033   EOSABS 0.0 01/05/2023 1033   BASOSABS 0.0 01/05/2023 1033    CMP     Component Value Date/Time   NA 139 01/05/2023 1033   K 4.1 01/05/2023 1033   CL 105 01/05/2023 1033   CO2 30 01/05/2023 1033   GLUCOSE 102 (H) 01/05/2023 1033   BUN 16 01/05/2023 1033   CREATININE 0.65 01/05/2023 1033   CALCIUM 9.2 01/05/2023 1033   PROT 7.1 01/05/2023 1033   ALBUMIN 3.9 01/05/2023 1033   AST 15 01/05/2023 1033   ALT 12 01/05/2023 1033   ALKPHOS 67 01/05/2023 1033   BILITOT 0.3 01/05/2023 1033   GFRNONAA >60 01/05/2023 1033   GFRAA >60 12/23/2015 1151      ASSESSMENT and THERAPY PLAN:   Malignant neoplasm of upper-outer quadrant of right female breast (HCC) Maria Carter is a 52 year old woman with 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  She is not receiving Taxol because of  peripheral neuropathy.  Treatment plan: Neoadjuvant chemotherapy with pembrolizumab, Taxol, carbo followed by pembrolizumab, Adriamycin, Cytoxan. Surgery with sentinel node  biopsy Adjuvant radiation Antiestrogen therapy  Chemo adverse effects: Peripheral neuropathy: This is improving.  The Taxol and her chemo regimen has been discontinued Fatigue: She is managing this with energy conservation and continues to exercise. Neutropenia: Her ANC is 1.1 today.  She will proceed with treatment.  She will receive the Udenyca which I have ordered on May 8 as an injection to prevent treatment delayed neutropenia, and to prevent febrile neutropenia which can lead to hospitalization.  Maria Carter will return on June 3 for labs, follow-up, and Keytruda, Adriamycin, Cytoxan.   All questions were answered. The patient knows to call the clinic with any problems, questions or concerns. We can certainly see the patient much sooner if necessary.  Total encounter time:30 minutes*in face-to-face visit time, chart review, lab review, care coordination, order entry, and documentation of the encounter time.    Lillard Anes, NP 01/05/23 12:10 PM Medical Oncology and Hematology [facility] [address] [place], [place] [address] Tel. [phone]    Fax. [phone]  *Total Encounter Time as defined by the Centers for Medicare and Medicaid Services includes, in addition to the face-to-face time of a patient visit (documented in the note above) non-face-to-face time: obtaining and reviewing outside history, ordering and reviewing medications, tests or procedures, care coordination (communications with other health care professionals or caregivers) and documentation in the medical record.

## 2023-01-05 NOTE — Patient Instructions (Signed)
[facility]  Discharge Instructions: Thank you for choosing [facility] to provide your oncology and hematology care.   If you have a lab appointment with the Cancer Center, please go directly to the Cancer Center and check in at the registration area.   Wear comfortable clothing and clothing appropriate for easy access to any Portacath or PICC line.   We strive to give you quality time with your provider. You may need to reschedule your appointment if you arrive late (15 or more minutes).  Arriving late affects you and other patients whose appointments are after yours.  Also, if you miss three or more appointments without notifying the office, you may be dismissed from the clinic at the provider's discretion.      For prescription refill requests, have your pharmacy contact our office and allow 72 hours for refills to be completed.    Today you received the following chemotherapy and/or immunotherapy agents: Keytruda, Carboplatin      To help prevent nausea and vomiting after your treatment, we encourage you to take your nausea medication as directed.  BELOW ARE SYMPTOMS THAT SHOULD BE REPORTED IMMEDIATELY: *FEVER GREATER THAN 100.4 F (38 C) OR HIGHER *CHILLS OR SWEATING *NAUSEA AND VOMITING THAT IS NOT CONTROLLED WITH YOUR NAUSEA MEDICATION *UNUSUAL SHORTNESS OF BREATH *UNUSUAL BRUISING OR BLEEDING *URINARY PROBLEMS (pain or burning when urinating, or frequent urination) *BOWEL PROBLEMS (unusual diarrhea, constipation, pain near the anus) TENDERNESS IN MOUTH AND THROAT WITH OR WITHOUT PRESENCE OF ULCERS (sore throat, sores in mouth, or a toothache) UNUSUAL RASH, SWELLING OR PAIN  UNUSUAL VAGINAL DISCHARGE OR ITCHING   Items with * indicate a potential emergency and should be followed up as soon as possible or go to the Emergency Department if any problems should occur.  Please show the CHEMOTHERAPY ALERT CARD or IMMUNOTHERAPY ALERT  CARD at check-in to the Emergency Department and triage nurse.  Should you have questions after your visit or need to cancel or reschedule your appointment, please contact  CANC[facility] CENTER AT [facility]  Dept: [phone]  and follow the prompts.  Office hours are 8:00 a.m. to 4:30 p.m. Monday - Friday. Please note that voicemails left after 4:00 p.m. may not be returned until the following business day.  We are closed weekends and major holidays. You have access to a nurse at all times for urgent questions. Please call the main number to the clinic Dept: [phone] and follow the prompts.   For any non-urgent questions, you may also contact your provider using MyChart. We now offer e-Visits for anyone 64 and older to request care online for non-urgent symptoms. For details visit [email or web address].   Also download the MyChart app! Go to the app store, search "MyChart", open the app, select , and log in with your MyChart username and password.

## 2023-01-05 NOTE — Assessment & Plan Note (Signed)
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  She is not receiving Taxol because of peripheral neuropathy.  Treatment plan: Neoadjuvant chemotherapy with pembrolizumab, Taxol, carbo followed by pembrolizumab, Adriamycin, Cytoxan. Surgery with sentinel node biopsy Adjuvant radiation Antiestrogen therapy  Chemo adverse effects: Peripheral neuropathy: This is improving.  The Taxol and her chemo regimen has been discontinued Fatigue: She is managing this with energy conservation and continues to exercise. Neutropenia: Her ANC is 1.1 today.  She will proceed with treatment.  She will receive the Udenyca which I have ordered on May 8 as an injection to prevent treatment delayed neutropenia, and to prevent febrile neutropenia which can lead to hospitalization.  Maria Carter will return on June 3 for labs, follow-up, and Keytruda, Adriamycin, Cytoxan.

## 2023-01-06 ENCOUNTER — Other Ambulatory Visit: Payer: Self-pay

## 2023-01-07 ENCOUNTER — Inpatient Hospital Stay: Payer: Medicare HMO

## 2023-01-07 VITALS — BP 117/67 | HR 66 | Temp 99.0°F | Resp 18

## 2023-01-07 DIAGNOSIS — Z170 Estrogen receptor positive status [ER+]: Secondary | ICD-10-CM | POA: Diagnosis not present

## 2023-01-07 DIAGNOSIS — C50411 Malignant neoplasm of upper-outer quadrant of right female breast: Secondary | ICD-10-CM | POA: Diagnosis not present

## 2023-01-07 DIAGNOSIS — Z5111 Encounter for antineoplastic chemotherapy: Secondary | ICD-10-CM | POA: Diagnosis not present

## 2023-01-07 DIAGNOSIS — Z79899 Other long term (current) drug therapy: Secondary | ICD-10-CM | POA: Diagnosis not present

## 2023-01-07 MED ORDER — PEGFILGRASTIM-CBQV 6 MG/0.6ML ~~LOC~~ SOSY
6.0000 mg | PREFILLED_SYRINGE | Freq: Once | SUBCUTANEOUS | Status: AC
  Administered 2023-01-07: 6 mg via SUBCUTANEOUS
  Filled 2023-01-07: qty 0.6

## 2023-01-20 ENCOUNTER — Other Ambulatory Visit: Payer: Self-pay | Admitting: Hematology and Oncology

## 2023-01-20 DIAGNOSIS — C50411 Malignant neoplasm of upper-outer quadrant of right female breast: Secondary | ICD-10-CM

## 2023-01-21 ENCOUNTER — Encounter: Payer: Self-pay | Admitting: *Deleted

## 2023-01-29 ENCOUNTER — Other Ambulatory Visit: Payer: Self-pay

## 2023-01-30 MED FILL — Fosaprepitant Dimeglumine For IV Infusion 150 MG (Base Eq): INTRAVENOUS | Qty: 5 | Status: AC

## 2023-01-30 MED FILL — Dexamethasone Sodium Phosphate Inj 100 MG/10ML: INTRAMUSCULAR | Qty: 1 | Status: AC

## 2023-02-02 ENCOUNTER — Inpatient Hospital Stay (HOSPITAL_BASED_OUTPATIENT_CLINIC_OR_DEPARTMENT_OTHER): Payer: Medicare HMO | Admitting: Adult Health

## 2023-02-02 ENCOUNTER — Encounter: Payer: Self-pay | Admitting: Adult Health

## 2023-02-02 ENCOUNTER — Other Ambulatory Visit: Payer: Self-pay

## 2023-02-02 ENCOUNTER — Inpatient Hospital Stay: Payer: Medicare HMO | Attending: Physician Assistant

## 2023-02-02 ENCOUNTER — Inpatient Hospital Stay: Payer: Medicare HMO

## 2023-02-02 DIAGNOSIS — Z79899 Other long term (current) drug therapy: Secondary | ICD-10-CM | POA: Insufficient documentation

## 2023-02-02 DIAGNOSIS — C50411 Malignant neoplasm of upper-outer quadrant of right female breast: Secondary | ICD-10-CM | POA: Diagnosis not present

## 2023-02-02 DIAGNOSIS — D696 Thrombocytopenia, unspecified: Secondary | ICD-10-CM | POA: Insufficient documentation

## 2023-02-02 DIAGNOSIS — Z170 Estrogen receptor positive status [ER+]: Secondary | ICD-10-CM | POA: Diagnosis not present

## 2023-02-02 DIAGNOSIS — Z5111 Encounter for antineoplastic chemotherapy: Secondary | ICD-10-CM | POA: Insufficient documentation

## 2023-02-02 DIAGNOSIS — Z95828 Presence of other vascular implants and grafts: Secondary | ICD-10-CM

## 2023-02-02 LAB — CBC WITH DIFFERENTIAL (CANCER CENTER ONLY)
Abs Immature Granulocytes: 0 10*3/uL (ref 0.00–0.07)
Basophils Absolute: 0 10*3/uL (ref 0.0–0.1)
Basophils Relative: 1 %
Eosinophils Absolute: 0.2 10*3/uL (ref 0.0–0.5)
Eosinophils Relative: 8 %
HCT: 30.6 % — ABNORMAL LOW (ref 36.0–46.0)
Hemoglobin: 9.9 g/dL — ABNORMAL LOW (ref 12.0–15.0)
Immature Granulocytes: 0 %
Lymphocytes Relative: 25 %
Lymphs Abs: 0.7 10*3/uL (ref 0.7–4.0)
MCH: 30.5 pg (ref 26.0–34.0)
MCHC: 32.4 g/dL (ref 30.0–36.0)
MCV: 94.2 fL (ref 80.0–100.0)
Monocytes Absolute: 0.3 10*3/uL (ref 0.1–1.0)
Monocytes Relative: 9 %
Neutro Abs: 1.6 10*3/uL — ABNORMAL LOW (ref 1.7–7.7)
Neutrophils Relative %: 57 %
Platelet Count: 160 10*3/uL (ref 150–400)
RBC: 3.25 MIL/uL — ABNORMAL LOW (ref 3.87–5.11)
RDW: 17.5 % — ABNORMAL HIGH (ref 11.5–15.5)
WBC Count: 2.8 10*3/uL — ABNORMAL LOW (ref 4.0–10.5)
nRBC: 0 % (ref 0.0–0.2)

## 2023-02-02 LAB — CMP (CANCER CENTER ONLY)
ALT: 13 U/L (ref 0–44)
AST: 19 U/L (ref 15–41)
Albumin: 4 g/dL (ref 3.5–5.0)
Alkaline Phosphatase: 81 U/L (ref 38–126)
Anion gap: 4 — ABNORMAL LOW (ref 5–15)
BUN: 13 mg/dL (ref 6–20)
CO2: 30 mmol/L (ref 22–32)
Calcium: 9 mg/dL (ref 8.9–10.3)
Chloride: 105 mmol/L (ref 98–111)
Creatinine: 0.81 mg/dL (ref 0.44–1.00)
GFR, Estimated: >60 mL/min (ref >60)
Glucose, Bld: 118 mg/dL — ABNORMAL HIGH (ref 70–99)
Potassium: 3.8 mmol/L (ref 3.5–5.1)
Sodium: 139 mmol/L (ref 135–145)
Total Bilirubin: 0.3 mg/dL (ref 0.3–1.2)
Total Protein: 7.1 g/dL (ref 6.5–8.1)

## 2023-02-02 LAB — PREGNANCY, URINE: Preg Test, Ur: NEGATIVE

## 2023-02-02 MED ORDER — SODIUM CHLORIDE 0.9 % IV SOLN: Freq: Once | INTRAVENOUS | Status: AC

## 2023-02-02 MED ORDER — PALONOSETRON HCL INJECTION 0.25 MG/5ML
0.2500 mg | Freq: Once | INTRAVENOUS | Status: AC
  Administered 2023-02-02: 0.25 mg via INTRAVENOUS
  Filled 2023-02-02: qty 5

## 2023-02-02 MED ORDER — SODIUM CHLORIDE 0.9% FLUSH
10.0000 mL | INTRAVENOUS | Status: DC | PRN
  Administered 2023-02-02: 10 mL

## 2023-02-02 MED ORDER — DEXAMETHASONE 4 MG PO TABS: ORAL_TABLET | ORAL | 0 refills | Status: DC

## 2023-02-02 MED ORDER — SODIUM CHLORIDE 0.9 % IV SOLN
150.0000 mg | Freq: Once | INTRAVENOUS | Status: AC
  Administered 2023-02-02: 150 mg via INTRAVENOUS
  Filled 2023-02-02: qty 150

## 2023-02-02 MED ORDER — SODIUM CHLORIDE 0.9 % IV SOLN
600.0000 mg/m2 | Freq: Once | INTRAVENOUS | Status: AC
  Administered 2023-02-02: 1000 mg via INTRAVENOUS
  Filled 2023-02-02: qty 50

## 2023-02-02 MED ORDER — PROCHLORPERAZINE MALEATE 10 MG PO TABS: 10.0000 mg | ORAL_TABLET | Freq: Four times a day (QID) | ORAL | 0 refills | Status: DC | PRN

## 2023-02-02 MED ORDER — SODIUM CHLORIDE 0.9% FLUSH
10.0000 mL | Freq: Once | INTRAVENOUS | Status: AC
  Administered 2023-02-02: 10 mL

## 2023-02-02 MED ORDER — DOXORUBICIN HCL CHEMO IV INJECTION 2 MG/ML
60.0000 mg/m2 | Freq: Once | INTRAVENOUS | Status: AC
  Administered 2023-02-02: 106 mg via INTRAVENOUS
  Filled 2023-02-02: qty 53

## 2023-02-02 MED ORDER — HEPARIN SOD (PORK) LOCK FLUSH 100 UNIT/ML IV SOLN
500.0000 [IU] | Freq: Once | INTRAVENOUS | Status: AC | PRN
  Administered 2023-02-02: 500 [IU]

## 2023-02-02 MED ORDER — SODIUM CHLORIDE 0.9 % IV SOLN
200.0000 mg | Freq: Once | INTRAVENOUS | Status: AC
  Administered 2023-02-02: 200 mg via INTRAVENOUS
  Filled 2023-02-02: qty 200

## 2023-02-02 MED ORDER — SODIUM CHLORIDE 0.9 % IV SOLN
10.0000 mg | Freq: Once | INTRAVENOUS | Status: AC
  Administered 2023-02-02: 10 mg via INTRAVENOUS
  Filled 2023-02-02: qty 10

## 2023-02-02 NOTE — Progress Notes (Signed)
[facility] Cancer Follow up:    Maria Shackleton, NP-C [address] [place] [address]   DIAGNOSIS:  Cancer Staging  Malignant neoplasm of upper-outer quadrant of right female breast [facility]) Staging form: Breast, AJCC 8th Edition - Clinical: Stage IIB (cT2, cN0, cM0, G3, ER+, PR-, HER2-) - Signed by [name], MD on 10/08/2022 Stage prefix: Initial diagnosis Histologic grading system: 3 grade system   SUMMARY OF ONCOLOGIC HISTORY: Oncology History  Malignant neoplasm of upper-outer quadrant of right female breast (HCC)  09/26/2022 Mammogram   Patient had a palpable right breast mass and hence underwent bilateral diagnostic mammogram.  This confirmed a suspicious 3 cm right upper outer quadrant mass and an abnormal right axillary lymph node with cortical thickening.  Tissue sampling of both the breast mass and axillary lymph node were recommended.  Left breast was normal.   09/26/2022 Breast US   Ultrasound breast confirmed the above-mentioned findings.   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  Prognostic showed ER 50% positive weak staining PR 0% negative HER2 1+ by IHC and Ki-67 of 70%   10/06/2022 Initial Diagnosis   Malignant neoplasm of upper-outer quadrant of right female breast (HCC)   10/08/2022 Cancer Staging   Staging form: Breast, AJCC 8th Edition - Clinical: Stage IIB (cT2, cN0, cM0, G3, ER+, PR-, HER2-) - Signed by [name], MD on 10/08/2022 Stage prefix: Initial diagnosis Histologic grading system: 3 grade system   10/24/2022 - 11/14/2022 Chemotherapy   Patient is on Treatment Plan : BREAST Pembrolizumab (200) D1 + Carboplatin (5) D1 + Paclitaxel (80) D1,8,15 q21d X 4 cycles / Pembrolizumab (200) D1 + AC D1 q21d x 4 cycles      Genetic Testing   [facility] Common Cancer Panel+RNA was Negative. Report date is 10/16/2022.  The Common Hereditary Cancers Panel offered by [facility]  includes sequencing and/or deletion duplication testing of the following 48 genes: APC, ATM, AXIN2, BAP1, BARD1, BMPR1A, BRCA1, BRCA2, BRIP1, CDH1, CDK4, CDKN2A (p14ARF and p16INK4a only), CHEK2, CTNNA1, DICER1, EPCAM (Deletion/duplication testing only), FH, GREM1 (promoter region duplication testing only), HOXB13, KIT, MBD4, MEN1, MLH1, MSH2, MSH3, MSH6, MUTYH, NF1, NHTL1, PALB2, PDGFRA, PMS2, POLD1, POLE, PTEN, RAD51C, RAD51D, SDHA (sequencing analysis only except exon 14), SDHB, SDHC, SDHD, SMAD4, SMARCA4. STK11, TP53, TSC1, TSC2, and VHL.   11/21/2022 -  Chemotherapy   Patient is on Treatment Plan : BREAST Pembrolizumab (200) D1 + Carboplatin (1.5) D1,8,15 + Paclitaxel (80) D1,8,15 q21d X 4 cycles / Pembrolizumab (200) D1 + AC D1 q21d x 4 cycles       CURRENT THERAPY: Pembrolizumab, Adriamycin, Cytoxan  INTERVAL HISTORY: Maria Carter 52 y.o. female returns for follow-up prior to receiving pembrolizumab, Adriamycin, Cytoxan.  Her prechemotherapy echocardiogram was completed on October 15, 2022 demonstrating a left ventricular ejection fraction of 60 to 65%.  She is feeling well today.  She denies any significant issues fatigue.  She has not been able to palpate her breast mass.   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  MEDICAL HISTORY: Past Medical History:  Diagnosis Date   Anxiety    Breast cancer (HCC)    Depression    Hx of febrile seizure    1980    SURGICAL HISTORY: Past Surgical History:  Procedure Laterality Date   BREAST BIOPSY Right 09/30/2022   [place] RT BREAST BX W LOC DEV 1ST LESION IMG BX SPEC US GUIDE 09/30/2022 [facility] MAMMOGRAPHY   PORTACATH PLACEMENT N/A 10/23/2022   Procedure: INSERTION PORT-A-CATH;  Surgeon: [name], MD;  Location: [facility];  Service: General;  Laterality: N/A;    SOCIAL HISTORY: Social History   Socioeconomic History   Marital status: Single    Spouse name: Not on file   Number of children: Not on file   Years of education: Not on file   Highest education level: Not on file  Occupational History   Not on file  Tobacco Use   Smoking status: Never   Smokeless tobacco: Never  Vaping Use   Vaping Use: Never used  Substance and Sexual Activity   Alcohol use: Yes    Comment: social   Drug use: No   Sexual activity: Not Currently    Partners: Male  Other Topics Concern   Not on file  Social History Narrative   Not on file   Social Determinants of Health   Financial Resource Strain: High Risk (10/08/2022)   Overall Financial Resource Strain (CARDIA)    Difficulty of Paying Living Expenses: Hard  Food Insecurity: Food Insecurity Present (10/08/2022)   Hunger Vital Sign    Worried About Running Out of Food in the Last Year: Sometimes true    Ran Out of Food in the Last Year: Sometimes true  Transportation Needs: No Transportation Needs (10/08/2022)   PRAPARE - Administrator, Civil Service (Medical): No    Lack of Transportation (Non-Medical): No  Physical Activity: Not on file  Stress: Not on file  Social Connections: Not on file  Intimate Partner Violence: Not on file    FAMILY HISTORY: Family History  Problem Relation Age of Onset    Breast cancer Sister 68 - 1   Cancer Paternal Grandmother        unknown type    Review of Systems  Constitutional:  Positive for fatigue. Negative for appetite change, chills, fever and unexpected weight change.  HENT:   Negative for hearing loss, lump/mass and trouble swallowing.   Eyes:  Negative for eye problems and icterus.  Respiratory:  Negative for chest tightness, cough and shortness of breath.   Cardiovascular:  Negative for chest pain, leg swelling and palpitations.  Gastrointestinal:  Negative for abdominal distention, abdominal pain, constipation, diarrhea, nausea and vomiting.  Endocrine: Negative for hot flashes.  Genitourinary:  Negative for difficulty urinating.   Musculoskeletal:  Negative for arthralgias.  Skin:  Negative for itching and rash.  Neurological:  Negative for dizziness, extremity weakness, headaches and numbness.  Hematological:  Negative for adenopathy. Does not bruise/bleed easily.  Psychiatric/Behavioral:  Negative for depression. The patient is not nervous/anxious.       PHYSICAL EXAMINATION   Onc Performance Status - 02/02/23 1115  ECOG Perf Status   ECOG Perf Status Fully active, able to carry on all pre-disease performance without restriction      KPS SCALE   KPS % SCORE Normal, no compliants, no evidence of disease             Vitals:   02/02/23 1114  BP: (!) 119/56  Pulse: 70  Resp: 17  Temp: 98.1 F (36.7 C)  SpO2: 100%    Physical Exam Constitutional:      General: She is not in acute distress.    Appearance: Normal appearance. She is not toxic-appearing.  HENT:     Head: Normocephalic and atraumatic.     Mouth/Throat:     Mouth: Mucous membranes are moist.     Pharynx: Oropharynx is clear. No oropharyngeal exudate or posterior oropharyngeal erythema.  Eyes:     General: No scleral icterus. Cardiovascular:     Rate and Rhythm: Normal rate and regular rhythm.     Pulses: Normal pulses.     Heart sounds:  Normal heart sounds.  Pulmonary:     Effort: Pulmonary effort is normal.     Breath sounds: Normal breath sounds.  Chest:     Comments: Difficulty palpating right upper outer breast mass-no progression noted Abdominal:     General: Abdomen is flat. Bowel sounds are normal. There is no distension.     Palpations: Abdomen is soft.     Tenderness: There is no abdominal tenderness.  Musculoskeletal:        General: No swelling.     Cervical back: Neck supple.  Lymphadenopathy:     Cervical: No cervical adenopathy.  Skin:    General: Skin is warm and dry.     Findings: No rash.  Neurological:     General: No focal deficit present.     Mental Status: She is alert.  Psychiatric:        Mood and Affect: Mood normal.        Behavior: Behavior normal.     LABORATORY DATA:  CBC    Component Value Date/Time   WBC 2.8 (L) 02/02/2023 1053   WBC 2.9 (L) 02/28/2022 1049   RBC 3.25 (L) 02/02/2023 1053   HGB 9.9 (L) 02/02/2023 1053   HCT 30.6 (L) 02/02/2023 1053   PLT 160 02/02/2023 1053   MCV 94.2 02/02/2023 1053   MCH 30.5 02/02/2023 1053   MCHC 32.4 02/02/2023 1053   RDW 17.5 (H) 02/02/2023 1053   LYMPHSABS 0.7 02/02/2023 1053   MONOABS 0.3 02/02/2023 1053   EOSABS 0.2 02/02/2023 1053   BASOSABS 0.0 02/02/2023 1053    CMP     Component Value Date/Time   NA 139 01/05/2023 1033   K 4.1 01/05/2023 1033   CL 105 01/05/2023 1033   CO2 30 01/05/2023 1033   GLUCOSE 102 (H) 01/05/2023 1033   BUN 16 01/05/2023 1033   CREATININE 0.65 01/05/2023 1033   CALCIUM 9.2 01/05/2023 1033   PROT 7.1 01/05/2023 1033   ALBUMIN 3.9 01/05/2023 1033   AST 15 01/05/2023 1033   ALT 12 01/05/2023 1033   ALKPHOS 67 01/05/2023 1033   BILITOT 0.3 01/05/2023 1033   GFRNONAA >60 01/05/2023 1033   GFRAA >60 12/23/2015 1151     ASSESSMENT and THERAPY PLAN:   Malignant neoplasm of upper-outer quadrant of right female breast (HCC) Maria Carter is a 52 year old woman with 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  Current treatment: Keytruda, Adriamycin, Cytoxan  Fatigue: This is managed with energy conservation Nausea prevention: Her previous antiemetics were discontinued from her medication list.  I reentered preventative nausea medication for her including dexamethasone and prochlorperazine.  I reviewed these with her in detail and reviewed where to find them on her after visit summary with the instructions on how to take them. At risk for heart failure: Her most recent echo pleated before chemotherapy was normal.  Maria Carter will return in 2 days for an injection and in 1 week for labs and follow-up to evaluate how she did with her treatment.  All questions were answered. The patient knows to call the clinic with any problems, questions or concerns. We can certainly see the patient much sooner if necessary.  Total encounter time:30 minutes*in face-to-face visit time, chart review, lab review, care coordination, order entry, and documentation of the encounter time.    Lillard Anes, NP 02/02/23 11:47 AM Medical Oncology and Hematology [facility] [address] [place], [place] [address] Tel. [phone]    Fax. [phone]  *Total Encounter Time as defined by the Centers for Medicare and Medicaid Services includes, in addition to the face-to-face time of a patient visit (documented in the note above) non-face-to-face time: obtaining and reviewing outside history, ordering and reviewing medications, tests or procedures, care coordination (communications with other health care professionals or caregivers) and documentation in the medical record.

## 2023-02-02 NOTE — Patient Instructions (Signed)
[facility]  Discharge Instructions: Thank you for choosing [facility] to provide your oncology and hematology care.   If you have a lab appointment with the Cancer Center, please go directly to the Cancer Center and check in at the registration area.   Wear comfortable clothing and clothing appropriate for easy access to any Portacath or PICC line.   We strive to give you quality time with your provider. You may need to reschedule your appointment if you arrive late (15 or more minutes).  Arriving late affects you and other patients whose appointments are after yours.  Also, if you miss three or more appointments without notifying the office, you may be dismissed from the clinic at the provider's discretion.      For prescription refill requests, have your pharmacy contact our office and allow 72 hours for refills to be completed.    Today you received the following chemotherapy and/or immunotherapy agents  Doxorubicin and  Cyclophosphamide    To help prevent nausea and vomiting after your treatment, we encourage you to take your nausea medication as directed.  BELOW ARE SYMPTOMS THAT SHOULD BE REPORTED IMMEDIATELY: *FEVER GREATER THAN 100.4 F (38 C) OR HIGHER *CHILLS OR SWEATING *NAUSEA AND VOMITING THAT IS NOT CONTROLLED WITH YOUR NAUSEA MEDICATION *UNUSUAL SHORTNESS OF BREATH *UNUSUAL BRUISING OR BLEEDING *URINARY PROBLEMS (pain or burning when urinating, or frequent urination) *BOWEL PROBLEMS (unusual diarrhea, constipation, pain near the anus) TENDERNESS IN MOUTH AND THROAT WITH OR WITHOUT PRESENCE OF ULCERS (sore throat, sores in mouth, or a toothache) UNUSUAL RASH, SWELLING OR PAIN  UNUSUAL VAGINAL DISCHARGE OR ITCHING   Items with * indicate a potential emergency and should be followed up as soon as possible or go to the Emergency Department if any problems should occur.  Please show the CHEMOTHERAPY ALERT CARD or  IMMUNOTHERAPY ALERT CARD at check-in to the Emergency Department and triage nurse.  Should you have questions after your visit or need to cancel or reschedule your appointment, please contact [facility]  Dept: [phone]  and follow the prompts.  Office hours are 8:00 a.m. to 4:30 p.m. Monday - Friday. Please note that voicemails left after 4:00 p.m. may not be returned until the following business day.  We are closed weekends and major holidays. You have access to a nurse at all times for urgent questions. Please call the main number to the clinic Dept: [phone] and follow the prompts.   For any non-urgent questions, you may also contact your provider using MyChart. We now offer e-Visits for anyone 51 and older to request care online for non-urgent symptoms. For details visit [email or web address].   Also download the MyChart app! Go to the app store, search "MyChart", open the app, select [facility], and log in with your MyChart username and password.  Doxorubicin Injection What is this medication? DOXORUBICIN (dox oh ROO bi sin) treats some types of cancer. It works by slowing down the growth of cancer cells. This medicine may be used for other purposes; ask your health care provider or pharmacist if you have questions. COMMON BRAND NAME(S): Adriamycin, Adriamycin PFS, Adriamycin RDF, Rubex What should I tell my care team before I take this medication? They need to know if you have any of these conditions: Heart disease History of low blood cell levels caused by a medication Liver disease Recent or ongoing radiation An unusual or allergic reaction to doxorubicin, other  medications, foods, dyes, or preservatives If you or your partner are pregnant or trying to get pregnant Breast-feeding How should I use this medication? This medication is injected into a vein. It is given by your care team in a hospital or clinic setting. Talk to your care  team about the use of this medication in children. Special care may be needed. Overdosage: If you think you have taken too much of this medicine contact a poison control center or emergency room at once. NOTE: This medicine is only for you. Do not share this medicine with others. What if I miss a dose? Keep appointments for follow-up doses. It is important not to miss your dose. Call your care team if you are unable to keep an appointment. What may interact with this medication? 6-mercaptopurine Paclitaxel Phenytoin St. John's wort Trastuzumab Verapamil This list may not describe all possible interactions. Give your health care provider a list of all the medicines, herbs, non-prescription drugs, or dietary supplements you use. Also tell them if you smoke, drink alcohol, or use illegal drugs. Some items may interact with your medicine. What should I watch for while using this medication? Your condition will be monitored carefully while you are receiving this medication. You may need blood work while taking this medication. This medication may make you feel generally unwell. This is not uncommon as chemotherapy can affect healthy cells as well as cancer cells. Report any side effects. Continue your course of treatment even though you feel ill unless your care team tells you to stop. There is a maximum amount of this medication you should receive throughout your life. The amount depends on the medical condition being treated and your overall health. Your care team will watch how much of this medication you receive. Tell your care team if you have taken this medication before. Your urine may turn red for a few days after your dose. This is not blood. If your urine is dark or brown, call your care team. In some cases, you may be given additional medications to help with side effects. Follow all directions for their use. This medication may increase your risk of getting an infection. Call your care team  for advice if you get a fever, chills, sore throat, or other symptoms of a cold or flu. Do not treat yourself. Try to avoid being around people who are sick. This medication may increase your risk to bruise or bleed. Call your care team if you notice any unusual bleeding. Talk to your care team about your risk of cancer. You may be more at risk for certain types of cancers if you take this medication. You should make sure that you get enough Coenzyme Q10 while you are taking this medication. Discuss the foods you eat and the vitamins you take with your care team. Talk to your care team if you or your partner may be pregnant. Serious birth defects can occur if you take this medication during pregnancy and for 6 months after the last dose. Contraception is recommended while taking this medication and for 6 months after the last dose. Your care team can help you find the option that works for you. If your partner can get pregnant, use a condom while taking this medication and for 6 months after the last dose. Do not breastfeed while taking this medication. This medication may cause infertility. Talk to your care team if you are concerned about your fertility. What side effects may I notice from receiving this medication?  Side effects that you should report to your care team as soon as possible: Allergic reactions--skin rash, itching, hives, swelling of the face, lips, tongue, or throat Heart failure--shortness of breath, swelling of the ankles, feet, or hands, sudden weight gain, unusual weakness or fatigue Heart rhythm changes--fast or irregular heartbeat, dizziness, feeling faint or lightheaded, chest pain, trouble breathing Infection--fever, chills, cough, sore throat, wounds that don't heal, pain or trouble when passing urine, general feeling of discomfort or being unwell Low red blood cell level--unusual weakness or fatigue, dizziness, headache, trouble breathing Painful swelling, warmth, or redness  of the skin, blisters or sores at the infusion site Unusual bruising or bleeding Side effects that usually do not require medical attention (report to your care team if they continue or are bothersome): Diarrhea Hair loss Nausea Pain, redness, or swelling with sores inside the mouth or throat Red urine This list may not describe all possible side effects. Call your doctor for medical advice about side effects. You may report side effects to FDA at 1-800-FDA-1088. Where should I keep my medication? This medication is given in a hospital or clinic. It will not be stored at home. NOTE: This sheet is a summary. It may not cover all possible information. If you have questions about this medicine, talk to your doctor, pharmacist, or health care provider.  2024 Elsevier/Gold Standard (2021-12-26 00:00:00)  Cyclophosphamide Injection What is this medication? CYCLOPHOSPHAMIDE (sye kloe FOSS fa mide) treats some types of cancer. It works by slowing down the growth of cancer cells. This medicine may be used for other purposes; ask your health care provider or pharmacist if you have questions. COMMON BRAND NAME(S): Cyclophosphamide, Cytoxan, Neosar What should I tell my care team before I take this medication? They need to know if you have any of these conditions: Heart disease Irregular heartbeat or rhythm Infection Kidney problems Liver disease Low blood cell levels (white cells, platelets, or red blood cells) Lung disease Previous radiation Trouble passing urine An unusual or allergic reaction to cyclophosphamide, other medications, foods, dyes, or preservatives Pregnant or trying to get pregnant Breast-feeding How should I use this medication? This medication is injected into a vein. It is given by your care team in a hospital or clinic setting. Talk to your care team about the use of this medication in children. Special care may be needed. Overdosage: If you think you have taken too  much of this medicine contact a poison control center or emergency room at once. NOTE: This medicine is only for you. Do not share this medicine with others. What if I miss a dose? Keep appointments for follow-up doses. It is important not to miss your dose. Call your care team if you are unable to keep an appointment. What may interact with this medication? Amphotericin B Amiodarone Azathioprine Certain antivirals for HIV or hepatitis Certain medications for blood pressure, such as enalapril, lisinopril, quinapril Cyclosporine Diuretics Etanercept Indomethacin Medications that relax muscles Metronidazole Natalizumab Tamoxifen Warfarin This list may not describe all possible interactions. Give your health care provider a list of all the medicines, herbs, non-prescription drugs, or dietary supplements you use. Also tell them if you smoke, drink alcohol, or use illegal drugs. Some items may interact with your medicine. What should I watch for while using this medication? This medication may make you feel generally unwell. This is not uncommon as chemotherapy can affect healthy cells as well as cancer cells. Report any side effects. Continue your course of treatment even though you  feel ill unless your care team tells you to stop. You may need blood work while you are taking this medication. This medication may increase your risk of getting an infection. Call your care team for advice if you get a fever, chills, sore throat, or other symptoms of a cold or flu. Do not treat yourself. Try to avoid being around people who are sick. Avoid taking medications that contain aspirin, acetaminophen, ibuprofen, naproxen, or ketoprofen unless instructed by your care team. These medications may hide a fever. Be careful brushing or flossing your teeth or using a toothpick because you may get an infection or bleed more easily. If you have any dental work done, tell your dentist you are receiving this  medication. Drink water or other fluids as directed. Urinate often, even at night. Some products may contain alcohol. Ask your care team if this medication contains alcohol. Be sure to tell all care teams you are taking this medicine. Certain medicines, like metronidazole and disulfiram, can cause an unpleasant reaction when taken with alcohol. The reaction includes flushing, headache, nausea, vomiting, sweating, and increased thirst. The reaction can last from 30 minutes to several hours. Talk to your care team if you wish to become pregnant or think you might be pregnant. This medication can cause serious birth defects if taken during pregnancy and for 1 year after the last dose. A negative pregnancy test is required before starting this medication. A reliable form of contraception is recommended while taking this medication and for 1 year after the last dose. Talk to your care team about reliable forms of contraception. Do not father a child while taking this medication and for 4 months after the last dose. Use a condom during this time period. Do not breast-feed while taking this medication or for 1 week after the last dose. This medication may cause infertility. Talk to your care team if you are concerned about your fertility. Talk to your care team about your risk of cancer. You may be more at risk for certain types of cancer if you take this medication. What side effects may I notice from receiving this medication? Side effects that you should report to your care team as soon as possible: Allergic reactions--skin rash, itching, hives, swelling of the face, lips, tongue, or throat Dry cough, shortness of breath or trouble breathing Heart failure--shortness of breath, swelling of the ankles, feet, or hands, sudden weight gain, unusual weakness or fatigue Heart muscle inflammation--unusual weakness or fatigue, shortness of breath, chest pain, fast or irregular heartbeat, dizziness, swelling of the  ankles, feet, or hands Heart rhythm changes--fast or irregular heartbeat, dizziness, feeling faint or lightheaded, chest pain, trouble breathing Infection--fever, chills, cough, sore throat, wounds that don't heal, pain or trouble when passing urine, general feeling of discomfort or being unwell Kidney injury--decrease in the amount of urine, swelling of the ankles, hands, or feet Liver injury--right upper belly pain, loss of appetite, nausea, light-colored stool, dark yellow or brown urine, yellowing skin or eyes, unusual weakness or fatigue Low red blood cell level--unusual weakness or fatigue, dizziness, headache, trouble breathing Low sodium level--muscle weakness, fatigue, dizziness, headache, confusion Red or dark brown urine Unusual bruising or bleeding Side effects that usually do not require medical attention (report to your care team if they continue or are bothersome): Hair loss Irregular menstrual cycles or spotting Loss of appetite Nausea Pain, redness, or swelling with sores inside the mouth or throat Vomiting This list may not describe all possible side effects.  Call your doctor for medical advice about side effects. You may report side effects to FDA at 1-800-FDA-1088. Where should I keep my medication? This medication is given in a hospital or clinic. It will not be stored at home. NOTE: This sheet is a summary. It may not cover all possible information. If you have questions about this medicine, talk to your doctor, pharmacist, or health care provider.  2024 Elsevier/Gold Standard (2022-01-03 00:00:00)

## 2023-02-02 NOTE — Assessment & Plan Note (Signed)
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  Current treatment: Keytruda, Adriamycin, Cytoxan  Fatigue: This is managed with energy conservation Nausea prevention: Her previous antiemetics were discontinued from her medication list.  I reentered preventative nausea medication for her including dexamethasone and prochlorperazine.  I reviewed these with her in detail and reviewed where to find them on her after visit summary with the instructions on how to take them. At risk for heart failure: Her most recent echo pleated before chemotherapy was normal.  [name] will return in 2 days for an injection and in 1 week for labs and follow-up to evaluate how she did with her treatment.

## 2023-02-04 ENCOUNTER — Telehealth: Payer: Self-pay

## 2023-02-04 ENCOUNTER — Inpatient Hospital Stay: Payer: Medicare HMO

## 2023-02-04 ENCOUNTER — Telehealth: Payer: Self-pay | Admitting: Adult Health

## 2023-02-04 VITALS — BP 132/73 | HR 66 | Temp 98.4°F | Resp 16

## 2023-02-04 DIAGNOSIS — Z170 Estrogen receptor positive status [ER+]: Secondary | ICD-10-CM | POA: Diagnosis not present

## 2023-02-04 DIAGNOSIS — D696 Thrombocytopenia, unspecified: Secondary | ICD-10-CM | POA: Diagnosis not present

## 2023-02-04 DIAGNOSIS — Z5111 Encounter for antineoplastic chemotherapy: Secondary | ICD-10-CM | POA: Diagnosis not present

## 2023-02-04 DIAGNOSIS — C50411 Malignant neoplasm of upper-outer quadrant of right female breast: Secondary | ICD-10-CM | POA: Diagnosis not present

## 2023-02-04 DIAGNOSIS — Z79899 Other long term (current) drug therapy: Secondary | ICD-10-CM | POA: Diagnosis not present

## 2023-02-04 MED ORDER — PEGFILGRASTIM-CBQV 6 MG/0.6ML ~~LOC~~ SOSY
6.0000 mg | PREFILLED_SYRINGE | Freq: Once | SUBCUTANEOUS | Status: AC
  Administered 2023-02-04: 6 mg via SUBCUTANEOUS
  Filled 2023-02-04: qty 0.6

## 2023-02-04 NOTE — Telephone Encounter (Signed)
Scheduled appointments per los. Patient is aware of the made appointments. 

## 2023-02-04 NOTE — Telephone Encounter (Signed)
Maria Carter states that she has been feeling nauseous and vomited a couple of times.  She said she had to wait 2 days to begin her nausea medications. Reviewed chart and instructed pt that she was to take the dexamethasone 4 mg tab daily with food beginning the day after chemotherapy. She has prochlorperazine to use as needed for nausea. She has not used it.  Told her to take the dexamethasone today and tomorrow am with food. If she is nauseous when she gets home after appointment at [facility] for pegfilgrastim injection, she should take the prochlorperazine. She should not drive when taking prochlorperazine as it can make her sleepy.  Told her to call the office at [phone] if the above is not controlling her nausea and vomiting. Pt verbalized understanding. Pt is able to drink fluids.

## 2023-02-04 NOTE — Telephone Encounter (Signed)
-----   Message from Curtis Sites, RN sent at 02/02/2023  2:22 PM EDT ----- Regarding: Dr. Al Pimple 1st tx f/u call Dr. Al Pimple 1st tx f/u call. A/C + Keytruda. Tolerated well. Had some nasal burning towards  end of infusion. Had pharmacy change rate to run oveer 45 minutes next time

## 2023-02-07 ENCOUNTER — Other Ambulatory Visit: Payer: Self-pay

## 2023-02-09 ENCOUNTER — Inpatient Hospital Stay (HOSPITAL_BASED_OUTPATIENT_CLINIC_OR_DEPARTMENT_OTHER): Payer: Medicare HMO | Admitting: Adult Health

## 2023-02-09 ENCOUNTER — Other Ambulatory Visit: Payer: Self-pay

## 2023-02-09 ENCOUNTER — Encounter: Payer: Self-pay | Admitting: Adult Health

## 2023-02-09 ENCOUNTER — Inpatient Hospital Stay: Payer: Medicare HMO

## 2023-02-09 VITALS — BP 117/58 | HR 66 | Temp 97.7°F | Resp 18 | Ht 61.0 in | Wt 159.4 lb

## 2023-02-09 DIAGNOSIS — C50411 Malignant neoplasm of upper-outer quadrant of right female breast: Secondary | ICD-10-CM

## 2023-02-09 DIAGNOSIS — Z5111 Encounter for antineoplastic chemotherapy: Secondary | ICD-10-CM | POA: Diagnosis not present

## 2023-02-09 DIAGNOSIS — D696 Thrombocytopenia, unspecified: Secondary | ICD-10-CM | POA: Diagnosis not present

## 2023-02-09 DIAGNOSIS — Z79899 Other long term (current) drug therapy: Secondary | ICD-10-CM | POA: Diagnosis not present

## 2023-02-09 DIAGNOSIS — Z95828 Presence of other vascular implants and grafts: Secondary | ICD-10-CM

## 2023-02-09 DIAGNOSIS — Z170 Estrogen receptor positive status [ER+]: Secondary | ICD-10-CM | POA: Diagnosis not present

## 2023-02-09 LAB — CBC WITH DIFFERENTIAL (CANCER CENTER ONLY)
Abs Immature Granulocytes: 0.02 10*3/uL (ref 0.00–0.07)
Basophils Absolute: 0 10*3/uL (ref 0.0–0.1)
Basophils Relative: 1 %
Eosinophils Absolute: 0.1 10*3/uL (ref 0.0–0.5)
Eosinophils Relative: 4 %
HCT: 30 % — ABNORMAL LOW (ref 36.0–46.0)
Hemoglobin: 10 g/dL — ABNORMAL LOW (ref 12.0–15.0)
Immature Granulocytes: 1 %
Lymphocytes Relative: 43 %
Lymphs Abs: 0.6 10*3/uL — ABNORMAL LOW (ref 0.7–4.0)
MCH: 31.4 pg (ref 26.0–34.0)
MCHC: 33.3 g/dL (ref 30.0–36.0)
MCV: 94.3 fL (ref 80.0–100.0)
Monocytes Absolute: 0.1 10*3/uL (ref 0.1–1.0)
Monocytes Relative: 5 %
Neutro Abs: 0.7 10*3/uL — ABNORMAL LOW (ref 1.7–7.7)
Neutrophils Relative %: 46 %
Platelet Count: 80 10*3/uL — ABNORMAL LOW (ref 150–400)
RBC: 3.18 MIL/uL — ABNORMAL LOW (ref 3.87–5.11)
RDW: 15.4 % (ref 11.5–15.5)
WBC Count: 1.4 10*3/uL — ABNORMAL LOW (ref 4.0–10.5)
nRBC: 0 % (ref 0.0–0.2)

## 2023-02-09 LAB — CMP (CANCER CENTER ONLY)
ALT: 78 U/L — ABNORMAL HIGH (ref 0–44)
AST: 55 U/L — ABNORMAL HIGH (ref 15–41)
Albumin: 4.3 g/dL (ref 3.5–5.0)
Alkaline Phosphatase: 114 U/L (ref 38–126)
Anion gap: 5 (ref 5–15)
BUN: 13 mg/dL (ref 6–20)
CO2: 28 mmol/L (ref 22–32)
Calcium: 9.2 mg/dL (ref 8.9–10.3)
Chloride: 102 mmol/L (ref 98–111)
Creatinine: 0.71 mg/dL (ref 0.44–1.00)
GFR, Estimated: >60 mL/min (ref >60)
Glucose, Bld: 118 mg/dL — ABNORMAL HIGH (ref 70–99)
Potassium: 4 mmol/L (ref 3.5–5.1)
Sodium: 135 mmol/L (ref 135–145)
Total Bilirubin: 0.4 mg/dL (ref 0.3–1.2)
Total Protein: 7.7 g/dL (ref 6.5–8.1)

## 2023-02-09 MED ORDER — HEPARIN SOD (PORK) LOCK FLUSH 100 UNIT/ML IV SOLN
500.0000 [IU] | Freq: Once | INTRAVENOUS | Status: AC
  Administered 2023-02-09: 500 [IU]

## 2023-02-09 MED ORDER — SODIUM CHLORIDE 0.9% FLUSH
10.0000 mL | Freq: Once | INTRAVENOUS | Status: AC
  Administered 2023-02-09: 10 mL

## 2023-02-09 NOTE — Progress Notes (Unsigned)
[facility] Cancer Follow up:    Maria Shackleton, NP-C [address] [place] [address]   DIAGNOSIS: Cancer Staging  Malignant neoplasm of upper-outer quadrant of right female breast [facility]) Staging form: Breast, AJCC 8th Edition - Clinical: Stage IIB (cT2, cN0, cM0, G3, ER+, PR-, HER2-) - Signed by [name], MD on 10/08/2022 Stage prefix: Initial diagnosis Histologic grading system: 3 grade system   SUMMARY OF ONCOLOGIC HISTORY: Oncology History  Malignant neoplasm of upper-outer quadrant of right female breast (HCC)  09/26/2022 Mammogram   Patient had a palpable right breast mass and hence underwent bilateral diagnostic mammogram.  This confirmed a suspicious 3 cm right upper outer quadrant mass and an abnormal right axillary lymph node with cortical thickening.  Tissue sampling of both the breast mass and axillary lymph node were recommended.  Left breast was normal.   09/26/2022 Breast US   Ultrasound breast confirmed the above-mentioned findings.   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  Prognostic showed ER 50% positive weak staining PR 0% negative HER2 1+ by IHC and Ki-67 of 70%   10/06/2022 Initial Diagnosis   Malignant neoplasm of upper-outer quadrant of right female breast (HCC)   10/08/2022 Cancer Staging   Staging form: Breast, AJCC 8th Edition - Clinical: Stage IIB (cT2, cN0, cM0, G3, ER+, PR-, HER2-) - Signed by [name], MD on 10/08/2022 Stage prefix: Initial diagnosis Histologic grading system: 3 grade system   10/24/2022 - 11/14/2022 Chemotherapy   Patient is on Treatment Plan : BREAST Pembrolizumab (200) D1 + Carboplatin (5) D1 + Paclitaxel (80) D1,8,15 q21d X 4 cycles / Pembrolizumab (200) D1 + AC D1 q21d x 4 cycles      Genetic Testing   [facility] Common Cancer Panel+RNA was Negative. Report date is 10/16/2022.  The Common Hereditary Cancers Panel offered by [facility]  includes sequencing and/or deletion duplication testing of the following 48 genes: APC, ATM, AXIN2, BAP1, BARD1, BMPR1A, BRCA1, BRCA2, BRIP1, CDH1, CDK4, CDKN2A (p14ARF and p16INK4a only), CHEK2, CTNNA1, DICER1, EPCAM (Deletion/duplication testing only), FH, GREM1 (promoter region duplication testing only), HOXB13, KIT, MBD4, MEN1, MLH1, MSH2, MSH3, MSH6, MUTYH, NF1, NHTL1, PALB2, PDGFRA, PMS2, POLD1, POLE, PTEN, RAD51C, RAD51D, SDHA (sequencing analysis only except exon 14), SDHB, SDHC, SDHD, SMAD4, SMARCA4. STK11, TP53, TSC1, TSC2, and VHL.   11/21/2022 -  Chemotherapy   Patient is on Treatment Plan : BREAST Pembrolizumab (200) D1 + Carboplatin (1.5) D1,8,15 + Paclitaxel (80) D1,8,15 q21d X 4 cycles / Pembrolizumab (200) D1 + AC D1 q21d x 4 cycles       CURRENT THERAPY:cycle 1 day 8 adriamycin/Cytoxan/Keytruda  INTERVAL HISTORY: Maria Carter 52 y.o. female returns for    Patient Active Problem List   Diagnosis Date Noted   Port-A-Cath in place 10/24/2022   Genetic testing 10/16/2022   Malignant neoplasm of upper-outer quadrant of right female breast (HCC) 10/06/2022   Thrombocytopenia (HCC) 03/05/2022   Leukopenia 03/05/2022   Hot flashes 02/28/2022   Hair thinning 02/28/2022   Amenorrhea 02/28/2022   Screening for colon cancer 02/28/2022   Multiple nevi 02/28/2022   PTSD (post-traumatic stress disorder) 02/08/2021   Mild episode of recurrent major depressive disorder (HCC) 09/12/2013   Attention deficit disorder 04/25/2013   Migraine 02/05/2013   KELOID SCAR 05/09/2010   URINALYSIS, ABNORMAL 05/09/2010   ANXIETY 03/01/2010   DEPRESSION 03/01/2010   HEADACHE 03/01/2010   Intermittent chest pain 03/01/2010  has No Known Allergies.  MEDICAL HISTORY: Past Medical History:  Diagnosis Date   Anxiety    Breast cancer (HCC)    Depression    Hx of febrile seizure    1980    SURGICAL HISTORY: Past Surgical History:  Procedure Laterality Date   BREAST BIOPSY Right  09/30/2022   [place] RT BREAST BX W LOC DEV 1ST LESION IMG BX SPEC US GUIDE 09/30/2022 [facility] MAMMOGRAPHY   PORTACATH PLACEMENT N/A 10/23/2022   Procedure: INSERTION PORT-A-CATH;  Surgeon: [name], MD;  Location: [facility];  Service: General;  Laterality: N/A;    SOCIAL HISTORY: Social History   Socioeconomic History   Marital status: Single    Spouse name: Not on file   Number of children: Not on file   Years of education: Not on file   Highest education level: Not on file  Occupational History   Not on file  Tobacco Use   Smoking status: Never   Smokeless tobacco: Never  Vaping Use   Vaping Use: Never used  Substance and Sexual Activity   Alcohol use: Yes    Comment: social   Drug use: No   Sexual activity: Not Currently    Partners: Male  Other Topics Concern   Not on file  Social History Narrative   Not on file   Social Determinants of Health   Financial Resource Strain: High Risk (10/08/2022)   Overall Financial Resource Strain (CARDIA)    Difficulty of Paying Living Expenses: Hard  Food Insecurity: Food Insecurity Present (10/08/2022)   Hunger Vital Sign    Worried About Running Out of Food in the Last Year: Sometimes true    Ran Out of Food in the Last Year: Sometimes true  Transportation Needs: No Transportation Needs (10/08/2022)   PRAPARE - Administrator, Civil Service (Medical): No    Lack of Transportation (Non-Medical): No  Physical Activity: Not on file  Stress: Not on file  Social Connections: Not on file  Intimate Partner Violence: Not on file    FAMILY HISTORY: Family History  Problem Relation Age of Onset   Breast cancer Sister 64 - 10   Cancer Paternal Grandmother        unknown type    Review of Systems - Oncology    PHYSICAL EXAMINATION   Onc Performance Status - 02/09/23 1500       ECOG Perf Status   ECOG Perf Status Fully active, able to carry on all pre-disease performance without restriction       KPS SCALE   KPS % SCORE Normal, no compliants, no evidence of disease             Vitals:   02/09/23 1553  BP: (!) 117/58  Pulse: 66  Resp: 18  Temp: 97.7 F (36.5 C)  SpO2: 100%    Physical Exam  LABORATORY DATA:  CBC    Component Value Date/Time   WBC 1.4 (L) 02/09/2023 1539   WBC 2.9 (L) 02/28/2022 1049   RBC 3.18 (L) 02/09/2023 1539   HGB 10.0 (L) 02/09/2023 1539   HCT 30.0 (L) 02/09/2023 1539   PLT 80 (L) 02/09/2023 1539   MCV 94.3 02/09/2023 1539   MCH 31.4 02/09/2023 1539   MCHC 33.3 02/09/2023 1539   RDW 15.4 02/09/2023 1539   LYMPHSABS PENDING 02/09/2023 1539   MONOABS PENDING 02/09/2023 1539   EOSABS PENDING 02/09/2023 1539   BASOSABS PENDING 02/09/2023 1539    CMP  Component Value Date/Time   NA 139 02/02/2023 1053   K 3.8 02/02/2023 1053   CL 105 02/02/2023 1053   CO2 30 02/02/2023 1053   GLUCOSE 118 (H) 02/02/2023 1053   BUN 13 02/02/2023 1053   CREATININE 0.81 02/02/2023 1053   CALCIUM 9.0 02/02/2023 1053   PROT 7.1 02/02/2023 1053   ALBUMIN 4.0 02/02/2023 1053   AST 19 02/02/2023 1053   ALT 13 02/02/2023 1053   ALKPHOS 81 02/02/2023 1053   BILITOT 0.3 02/02/2023 1053   GFRNONAA >60 02/02/2023 1053   GFRAA >60 12/23/2015 1151       PENDING LABS:   RADIOGRAPHIC STUDIES:  No results found.   PATHOLOGY:     ASSESSMENT and THERAPY PLAN:   No problem-specific Assessment & Plan notes found for this encounter.   No orders of the defined types were placed in this encounter.   All questions were answered. The patient knows to call the clinic with any problems, questions or concerns. We can certainly see the patient much sooner if necessary. This note was electronically signed. Noreene Filbert, NP 02/09/2023

## 2023-02-10 ENCOUNTER — Encounter: Payer: Self-pay | Admitting: Hematology and Oncology

## 2023-02-10 ENCOUNTER — Other Ambulatory Visit: Payer: Self-pay | Admitting: *Deleted

## 2023-02-10 ENCOUNTER — Telehealth: Payer: Self-pay | Admitting: *Deleted

## 2023-02-10 DIAGNOSIS — R7989 Other specified abnormal findings of blood chemistry: Secondary | ICD-10-CM

## 2023-02-10 DIAGNOSIS — C50411 Malignant neoplasm of upper-outer quadrant of right female breast: Secondary | ICD-10-CM

## 2023-02-10 LAB — TSH: TSH: 73.063 u[IU]/mL — ABNORMAL HIGH (ref 0.350–4.500)

## 2023-02-10 MED ORDER — LEVOTHYROXINE SODIUM 25 MCG PO TABS
25.0000 ug | ORAL_TABLET | Freq: Every day | ORAL | 0 refills | Status: DC
Start: 2023-02-10 — End: 2023-02-25

## 2023-02-10 NOTE — Assessment & Plan Note (Signed)
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  Current treatment: Keytruda, Adriamycin, Cytoxan  Fatigue: Recommended energy conservation.  This is worse from prior. Chemotherapy-induced neutropenia.  I will send her visit note with labs to Dr. Al Pimple so she can consider dose reduction. Nausea: She had increased difficulty with nausea with this chemotherapy regimen.  This is improving and she declined that I sent in ondansetron to start the third day after treatment. At risk for heart failure: Echo pre-chemo was normal  Maria Carter will return in 2 weeks for labs, f/u with Dr. Al Pimple, and her next cycle of chemotherapy.

## 2023-02-10 NOTE — Telephone Encounter (Addendum)
-----   Message from Loa Socks, NP sent at 02/10/2023  1:25 PM EDT ----- Regarding:  TSH elevated, please send in synthroid daily  Contacted patient per NP request with message above. Patient verbalized understanding and requested RX be sent to [facility] on [address]. RX sent as directed by provider in note above

## 2023-02-11 LAB — T4: T4, Total: 1.3 ug/dL — ABNORMAL LOW (ref 4.5–12.0)

## 2023-02-19 ENCOUNTER — Other Ambulatory Visit: Payer: Self-pay

## 2023-02-20 MED FILL — Fosaprepitant Dimeglumine For IV Infusion 150 MG (Base Eq): INTRAVENOUS | Qty: 5 | Status: AC

## 2023-02-20 MED FILL — Dexamethasone Sodium Phosphate Inj 100 MG/10ML: INTRAMUSCULAR | Qty: 1 | Status: AC

## 2023-02-23 ENCOUNTER — Other Ambulatory Visit: Payer: Self-pay

## 2023-02-23 ENCOUNTER — Inpatient Hospital Stay: Payer: Medicare HMO

## 2023-02-23 ENCOUNTER — Inpatient Hospital Stay (HOSPITAL_BASED_OUTPATIENT_CLINIC_OR_DEPARTMENT_OTHER): Payer: Medicare HMO | Admitting: Hematology and Oncology

## 2023-02-23 VITALS — BP 110/66 | HR 63 | Temp 98.1°F | Resp 18

## 2023-02-23 DIAGNOSIS — D696 Thrombocytopenia, unspecified: Secondary | ICD-10-CM | POA: Diagnosis not present

## 2023-02-23 DIAGNOSIS — Z79899 Other long term (current) drug therapy: Secondary | ICD-10-CM | POA: Diagnosis not present

## 2023-02-23 DIAGNOSIS — C50411 Malignant neoplasm of upper-outer quadrant of right female breast: Secondary | ICD-10-CM

## 2023-02-23 DIAGNOSIS — Z5111 Encounter for antineoplastic chemotherapy: Secondary | ICD-10-CM | POA: Diagnosis not present

## 2023-02-23 DIAGNOSIS — Z170 Estrogen receptor positive status [ER+]: Secondary | ICD-10-CM | POA: Diagnosis not present

## 2023-02-23 DIAGNOSIS — Z95828 Presence of other vascular implants and grafts: Secondary | ICD-10-CM

## 2023-02-23 LAB — CMP (CANCER CENTER ONLY)
ALT: 15 U/L (ref 0–44)
AST: 23 U/L (ref 15–41)
Albumin: 3.9 g/dL (ref 3.5–5.0)
Alkaline Phosphatase: 84 U/L (ref 38–126)
Anion gap: 5 (ref 5–15)
BUN: 14 mg/dL (ref 6–20)
CO2: 32 mmol/L (ref 22–32)
Calcium: 9.5 mg/dL (ref 8.9–10.3)
Chloride: 103 mmol/L (ref 98–111)
Creatinine: 0.86 mg/dL (ref 0.44–1.00)
GFR, Estimated: >60 mL/min (ref >60)
Glucose, Bld: 125 mg/dL — ABNORMAL HIGH (ref 70–99)
Potassium: 3.6 mmol/L (ref 3.5–5.1)
Sodium: 140 mmol/L (ref 135–145)
Total Bilirubin: 0.2 mg/dL — ABNORMAL LOW (ref 0.3–1.2)
Total Protein: 7 g/dL (ref 6.5–8.1)

## 2023-02-23 LAB — CBC WITH DIFFERENTIAL (CANCER CENTER ONLY)
Abs Immature Granulocytes: 0.26 10*3/uL — ABNORMAL HIGH (ref 0.00–0.07)
Basophils Absolute: 0 10*3/uL (ref 0.0–0.1)
Basophils Relative: 0 %
Eosinophils Absolute: 0 10*3/uL (ref 0.0–0.5)
Eosinophils Relative: 0 %
HCT: 29.5 % — ABNORMAL LOW (ref 36.0–46.0)
Hemoglobin: 9.6 g/dL — ABNORMAL LOW (ref 12.0–15.0)
Immature Granulocytes: 5 %
Lymphocytes Relative: 14 %
Lymphs Abs: 0.8 10*3/uL (ref 0.7–4.0)
MCH: 31.2 pg (ref 26.0–34.0)
MCHC: 32.5 g/dL (ref 30.0–36.0)
MCV: 95.8 fL (ref 80.0–100.0)
Monocytes Absolute: 0.6 10*3/uL (ref 0.1–1.0)
Monocytes Relative: 11 %
Neutro Abs: 4 10*3/uL (ref 1.7–7.7)
Neutrophils Relative %: 70 %
Platelet Count: 278 10*3/uL (ref 150–400)
RBC: 3.08 MIL/uL — ABNORMAL LOW (ref 3.87–5.11)
RDW: 16.7 % — ABNORMAL HIGH (ref 11.5–15.5)
WBC Count: 5.7 10*3/uL (ref 4.0–10.5)
nRBC: 0 % (ref 0.0–0.2)

## 2023-02-23 LAB — TSH: TSH: 92.54 u[IU]/mL — ABNORMAL HIGH (ref 0.350–4.500)

## 2023-02-23 LAB — PREGNANCY, URINE: Preg Test, Ur: NEGATIVE

## 2023-02-23 MED ORDER — SODIUM CHLORIDE 0.9 % IV SOLN
150.0000 mg | Freq: Once | INTRAVENOUS | Status: AC
  Administered 2023-02-23: 150 mg via INTRAVENOUS
  Filled 2023-02-23: qty 150

## 2023-02-23 MED ORDER — SODIUM CHLORIDE 0.9 % IV SOLN
500.0000 mg/m2 | Freq: Once | INTRAVENOUS | Status: AC
  Administered 2023-02-23: 880 mg via INTRAVENOUS
  Filled 2023-02-23: qty 44

## 2023-02-23 MED ORDER — SODIUM CHLORIDE 0.9% FLUSH
10.0000 mL | INTRAVENOUS | Status: DC | PRN
  Administered 2023-02-23: 10 mL

## 2023-02-23 MED ORDER — DOXORUBICIN HCL CHEMO IV INJECTION 2 MG/ML
50.0000 mg/m2 | Freq: Once | INTRAVENOUS | Status: AC
  Administered 2023-02-23: 88 mg via INTRAVENOUS
  Filled 2023-02-23: qty 44

## 2023-02-23 MED ORDER — SODIUM CHLORIDE 0.9 % IV SOLN
10.0000 mg | Freq: Once | INTRAVENOUS | Status: AC
  Administered 2023-02-23: 10 mg via INTRAVENOUS
  Filled 2023-02-23: qty 10

## 2023-02-23 MED ORDER — SODIUM CHLORIDE 0.9 % IV SOLN: Freq: Once | INTRAVENOUS | Status: AC

## 2023-02-23 MED ORDER — SODIUM CHLORIDE 0.9 % IV SOLN
200.0000 mg | Freq: Once | INTRAVENOUS | Status: AC
  Administered 2023-02-23: 200 mg via INTRAVENOUS
  Filled 2023-02-23: qty 200

## 2023-02-23 MED ORDER — PALONOSETRON HCL INJECTION 0.25 MG/5ML
0.2500 mg | Freq: Once | INTRAVENOUS | Status: AC
  Administered 2023-02-23: 0.25 mg via INTRAVENOUS
  Filled 2023-02-23: qty 5

## 2023-02-23 MED ORDER — HEPARIN SOD (PORK) LOCK FLUSH 100 UNIT/ML IV SOLN
500.0000 [IU] | Freq: Once | INTRAVENOUS | Status: AC | PRN
  Administered 2023-02-23: 500 [IU]

## 2023-02-23 MED ORDER — SODIUM CHLORIDE 0.9% FLUSH
10.0000 mL | Freq: Once | INTRAVENOUS | Status: AC
  Administered 2023-02-23: 10 mL

## 2023-02-23 NOTE — Progress Notes (Signed)
[facility] Cancer Follow up:    [name], NP-C [address] [place] [address]   DIAGNOSIS:  Cancer Staging  Malignant neoplasm of upper-outer quadrant of right female breast [facility]) Staging form: Breast, AJCC 8th Edition - Clinical: Stage IIB (cT2, cN0, cM0, G3, ER+, PR-, HER2-) - Signed by [name], MD on 10/08/2022 Stage prefix: Initial diagnosis Histologic grading system: 3 grade system   SUMMARY OF ONCOLOGIC HISTORY: Oncology History  Malignant neoplasm of upper-outer quadrant of right female breast (HCC)  09/26/2022 Mammogram   Patient had a palpable right breast mass and hence underwent bilateral diagnostic mammogram.  This confirmed a suspicious 3 cm right upper outer quadrant mass and an abnormal right axillary lymph node with cortical thickening.  Tissue sampling of both the breast mass and axillary lymph node were recommended.  Left breast was normal.   09/26/2022 Breast US   Ultrasound breast confirmed the above-mentioned findings.   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  Prognostic showed ER 50% positive weak staining PR 0% negative HER2 1+ by IHC and Ki-67 of 70%   10/06/2022 Initial Diagnosis   Malignant neoplasm of upper-outer quadrant of right female breast (HCC)   10/08/2022 Cancer Staging   Staging form: Breast, AJCC 8th Edition - Clinical: Stage IIB (cT2, cN0, cM0, G3, ER+, PR-, HER2-) - Signed by [name], MD on 10/08/2022 Stage prefix: Initial diagnosis Histologic grading system: 3 grade system   10/24/2022 - 11/14/2022 Chemotherapy   Patient is on Treatment Plan : BREAST Pembrolizumab (200) D1 + Carboplatin (5) D1 + Paclitaxel (80) D1,8,15 q21d X 4 cycles / Pembrolizumab (200) D1 + AC D1 q21d x 4 cycles      Genetic Testing   [facility] Common Cancer Panel+RNA was Negative. Report date is 10/16/2022.  The Common Hereditary Cancers Panel offered by [facility]  includes sequencing and/or deletion duplication testing of the following 48 genes: APC, ATM, AXIN2, BAP1, BARD1, BMPR1A, BRCA1, BRCA2, BRIP1, CDH1, CDK4, CDKN2A (p14ARF and p16INK4a only), CHEK2, CTNNA1, DICER1, EPCAM (Deletion/duplication testing only), FH, GREM1 (promoter region duplication testing only), HOXB13, KIT, MBD4, MEN1, MLH1, MSH2, MSH3, MSH6, MUTYH, NF1, NHTL1, PALB2, PDGFRA, PMS2, POLD1, POLE, PTEN, RAD51C, RAD51D, SDHA (sequencing analysis only except exon 14), SDHB, SDHC, SDHD, SMAD4, SMARCA4. STK11, TP53, TSC1, TSC2, and VHL.   11/21/2022 -  Chemotherapy   Patient is on Treatment Plan : BREAST Pembrolizumab (200) D1 + Carboplatin (1.5) D1,8,15 + Paclitaxel (80) D1,8,15 q21d X 4 cycles / Pembrolizumab (200) D1 + AC D1 q21d x 4 cycles       CURRENT THERAPY:cycle 1 day 8 adriamycin/Cytoxan/Keytruda  INTERVAL HISTORY: Maria Carter 52 y.o. female returns for follow-up after receiving her first cycle of Adriamycin, Cytoxan, and Keytruda.   She says it was very rough for her after the last chemo She had a lot of nausea and vomiting, could not eat very much for the past couple weeks, she has just started recovering.  She was very emotional today because she is not able to stay active, play with her dogs or visit her dad who is also going through some cancer and chemotherapy.  He has not been checking on the breast mass. She otherwise denies any diarrhea.  No neuropathy currently, the burning sensation in her feet has significantly improved since we stopped Taxol.  Rest of the pertinent 10 point ROS reviewed and negative  Patient Active Problem List  Diagnosis Date Noted   Port-A-Cath in place 10/24/2022   Genetic testing 10/16/2022   Malignant neoplasm of upper-outer quadrant of right female breast (HCC) 10/06/2022   Thrombocytopenia (HCC) 03/05/2022   Leukopenia 03/05/2022   Hot flashes 02/28/2022   Hair thinning 02/28/2022   Amenorrhea 02/28/2022   Screening for colon cancer  02/28/2022   Multiple nevi 02/28/2022   PTSD (post-traumatic stress disorder) 02/08/2021   Mild episode of recurrent major depressive disorder (HCC) 09/12/2013   Attention deficit disorder 04/25/2013   Migraine 02/05/2013   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  MEDICAL HISTORY: Past Medical History:  Diagnosis Date   Anxiety    Breast cancer (HCC)    Depression    Hx of febrile seizure    1980    SURGICAL HISTORY: Past Surgical History:  Procedure Laterality Date   BREAST BIOPSY Right 09/30/2022   [place] RT BREAST BX W LOC DEV 1ST LESION IMG BX SPEC US GUIDE 09/30/2022 [facility] MAMMOGRAPHY   PORTACATH PLACEMENT N/A 10/23/2022   Procedure: INSERTION PORT-A-CATH;  Surgeon: [name], MD;  Location: [facility];  Service: General;  Laterality: N/A;    SOCIAL HISTORY: Social History   Socioeconomic History   Marital status: Single    Spouse name: Not on file   Number of children: Not on file   Years of education: Not on file   Highest education level: Not on file  Occupational History   Not on file  Tobacco Use   Smoking status: Never   Smokeless tobacco: Never  Vaping Use   Vaping Use: Never used  Substance and Sexual Activity   Alcohol use: Yes    Comment: social   Drug use: No   Sexual activity: Not Currently    Partners: Male  Other Topics Concern   Not on file  Social History Narrative   Not on file   Social Determinants of Health   Financial Resource Strain: High Risk (10/08/2022)   Overall Financial Resource Strain (CARDIA)    Difficulty of Paying Living Expenses: Hard  Food Insecurity: Food Insecurity Present (10/08/2022)   Hunger Vital Sign    Worried About Running Out of Food in the Last Year: Sometimes true    Ran Out of Food in the Last Year: Sometimes true  Transportation Needs:  No Transportation Needs (10/08/2022)   PRAPARE - Administrator, Civil Service (Medical): No    Lack of Transportation (Non-Medical): No  Physical Activity: Not on file  Stress: Not on file  Social Connections: Not on file  Intimate Partner Violence: Not on file    FAMILY HISTORY: Family History  Problem Relation Age of Onset   Breast cancer Sister 78 - 69   Cancer Paternal Grandmother        unknown type    Review of Systems  Constitutional:  Positive for appetite change and fatigue. Negative for chills, fever and unexpected weight change.  HENT:   Negative for hearing loss, lump/mass, mouth sores and trouble swallowing.   Eyes:  Negative for eye problems and icterus.  Respiratory:  Negative for chest tightness, cough and shortness of breath.   Cardiovascular:  Negative for chest pain, leg swelling and palpitations.  Gastrointestinal:  Positive for nausea. Negative for abdominal distention, abdominal pain, constipation, diarrhea and vomiting.  Endocrine: Positive for  hot flashes.  Genitourinary:  Negative for difficulty urinating.   Musculoskeletal:  Negative for arthralgias.  Skin:  Negative for itching and rash.  Neurological:  Negative for dizziness, extremity weakness, headaches and numbness.  Hematological:  Negative for adenopathy. Does not bruise/bleed easily.  Psychiatric/Behavioral:  Negative for depression. The patient is not nervous/anxious.       PHYSICAL EXAMINATION     Vitals:   02/23/23 1132  BP: (!) 118/56  Pulse: 73  Resp: 16  Temp: (!) 97.5 F (36.4 C)  SpO2: 99%    Physical Exam Constitutional:      General: She is not in acute distress.    Appearance: Normal appearance. She is not ill-appearing or toxic-appearing.     Comments: Patient appears tired today.  HENT:     Head: Normocephalic and atraumatic.     Mouth/Throat:     Mouth: Mucous membranes are moist.     Pharynx: Oropharynx is clear. No oropharyngeal exudate or posterior  oropharyngeal erythema.  Eyes:     General: No scleral icterus. Cardiovascular:     Rate and Rhythm: Normal rate and regular rhythm.     Pulses: Normal pulses.     Heart sounds: Normal heart sounds.  Pulmonary:     Effort: Pulmonary effort is normal.     Breath sounds: Normal breath sounds.  Abdominal:     General: Abdomen is flat. Bowel sounds are normal. There is no distension.     Palpations: Abdomen is soft.     Tenderness: There is no abdominal tenderness.  Musculoskeletal:        General: No swelling.     Cervical back: Neck supple.  Lymphadenopathy:     Cervical: No cervical adenopathy.  Skin:    General: Skin is warm and dry.     Findings: No rash.  Neurological:     General: No focal deficit present.     Mental Status: She is alert.  Psychiatric:        Mood and Affect: Mood normal.        Behavior: Behavior normal.     LABORATORY DATA:  CBC    Component Value Date/Time   WBC 5.7 02/23/2023 1111   WBC 2.9 (L) 02/28/2022 1049   RBC 3.08 (L) 02/23/2023 1111   HGB 9.6 (L) 02/23/2023 1111   HCT 29.5 (L) 02/23/2023 1111   PLT 278 02/23/2023 1111   MCV 95.8 02/23/2023 1111   MCH 31.2 02/23/2023 1111   MCHC 32.5 02/23/2023 1111   RDW 16.7 (H) 02/23/2023 1111   LYMPHSABS 0.8 02/23/2023 1111   MONOABS 0.6 02/23/2023 1111   EOSABS 0.0 02/23/2023 1111   BASOSABS 0.0 02/23/2023 1111    CMP     Component Value Date/Time   NA 140 02/23/2023 1111   K 3.6 02/23/2023 1111   CL 103 02/23/2023 1111   CO2 32 02/23/2023 1111   GLUCOSE 125 (H) 02/23/2023 1111   BUN 14 02/23/2023 1111   CREATININE 0.86 02/23/2023 1111   CALCIUM 9.5 02/23/2023 1111   PROT 7.0 02/23/2023 1111   ALBUMIN 3.9 02/23/2023 1111   AST 23 02/23/2023 1111   ALT 15 02/23/2023 1111   ALKPHOS 84 02/23/2023 1111   BILITOT 0.2 (L) 02/23/2023 1111   GFRNONAA >60 02/23/2023 1111   GFRAA >60 12/23/2015 1151   ASSESSMENT and THERAPY PLAN:   Malignant neoplasm of upper-outer quadrant of right  female breast [facility]) This is a very pleasant 52 year old female patient with no  significant past medical history, significant family history of breast cancer in sister who died at the age of 5 from metastatic breast cancer referred to breast [facility] with a new palpable lump biopsy-proven invasive carcinoma, high-grade, ER staining, PR negative and HER2 negative with a high proliferation index.  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  We have discussed about considering keynote 522 which consists of combination chemotherapy/immunotherapy.   Although lymph node was thought to be suspicious on the initial imaging, this was not seen on the ultrasound to consider biopsy hence it was not biopsied.  She is now on chemotherapy per KEYNOTE 522.  She is now on Adriamycin and cyclophosphamide with Keytruda.  There has been a significant change in the breast mass, no definitive palpable mass in the right breast upper outer quadrant.  She is understandably very nauseated, had vomiting, could not eat well and felt terrible after the first cycle of Adriamycin and cyclophosphamide.  We have discussed about dose reduction by 20% for better tolerance.  She was very emotional, reassured her that she is having a great clinical response so far.  She will complete chemotherapy as scheduled and proceed with surgery about 4 weeks after completion of chemotherapy for nausea, she can use Zofran alternating with Compazine.  No neuropathy reported today.  She will return to clinic before planned cycle 3 of Adriamycin cyclophosphamide  All questions were answered. The patient knows to call the clinic with any problems, questions or concerns. We can certainly see the patient much sooner if necessary.  Total encounter time:30 minutes*in face-to-face visit time, chart review, lab review, care coordination, order entry, and documentation of the  encounter time.  *Total Encounter Time as defined by the Centers for Medicare and Medicaid Services includes, in addition to the face-to-face time of a patient visit (documented in the note above) non-face-to-face time: obtaining and reviewing outside history, ordering and reviewing medications, tests or procedures, care coordination (communications with other health care professionals or caregivers) and documentation in the medical record.

## 2023-02-23 NOTE — Assessment & Plan Note (Signed)
This is a very pleasant 52 year old female patient with no significant past medical history, significant family history of breast cancer in sister who died at the age of 90 from metastatic breast cancer referred to [facility] with a new palpable lump biopsy-proven invasive carcinoma, high-grade, ER staining, PR negative and HER2 negative with a high proliferation index.  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  We have discussed about considering keynote 522 which consists of combination chemotherapy/immunotherapy.   Although lymph node was thought to be suspicious on the initial imaging, this was not seen on the ultrasound to consider biopsy hence it was not biopsied.  She is now on chemotherapy per KEYNOTE 522.  She is now on Adriamycin and cyclophosphamide with Keytruda.  There has been a significant change in the breast mass, no definitive palpable mass in the right breast upper outer quadrant.  She is understandably very nauseated, had vomiting, could not eat well and felt terrible after the first cycle of Adriamycin and cyclophosphamide.  We have discussed about dose reduction by 20% for better tolerance.  She was very emotional, reassured her that she is having a great clinical response so far.  She will complete chemotherapy as scheduled and proceed with surgery about 4 weeks after completion of chemotherapy for nausea, she can use Zofran alternating with Compazine.  No neuropathy reported today.  She will return to clinic before planned cycle 3 of Adriamycin cyclophosphamide

## 2023-02-24 ENCOUNTER — Other Ambulatory Visit: Payer: Self-pay | Admitting: *Deleted

## 2023-02-24 DIAGNOSIS — R7989 Other specified abnormal findings of blood chemistry: Secondary | ICD-10-CM

## 2023-02-24 LAB — T4: T4, Total: 1.3 ug/dL — ABNORMAL LOW (ref 4.5–12.0)

## 2023-02-25 ENCOUNTER — Other Ambulatory Visit: Payer: Self-pay

## 2023-02-25 ENCOUNTER — Inpatient Hospital Stay: Payer: Medicare HMO

## 2023-02-25 ENCOUNTER — Other Ambulatory Visit: Payer: Self-pay | Admitting: *Deleted

## 2023-02-25 VITALS — BP 103/71 | HR 66 | Temp 98.5°F | Resp 18

## 2023-02-25 DIAGNOSIS — C50411 Malignant neoplasm of upper-outer quadrant of right female breast: Secondary | ICD-10-CM | POA: Diagnosis not present

## 2023-02-25 DIAGNOSIS — Z79899 Other long term (current) drug therapy: Secondary | ICD-10-CM | POA: Diagnosis not present

## 2023-02-25 DIAGNOSIS — Z5111 Encounter for antineoplastic chemotherapy: Secondary | ICD-10-CM | POA: Diagnosis not present

## 2023-02-25 DIAGNOSIS — R7989 Other specified abnormal findings of blood chemistry: Secondary | ICD-10-CM

## 2023-02-25 DIAGNOSIS — Z170 Estrogen receptor positive status [ER+]: Secondary | ICD-10-CM | POA: Diagnosis not present

## 2023-02-25 DIAGNOSIS — D696 Thrombocytopenia, unspecified: Secondary | ICD-10-CM | POA: Diagnosis not present

## 2023-02-25 MED ORDER — LEVOTHYROXINE SODIUM 25 MCG PO TABS
50.0000 ug | ORAL_TABLET | Freq: Every day | ORAL | 3 refills | Status: DC
Start: 2023-02-25 — End: 2023-04-28

## 2023-02-25 MED ORDER — PEGFILGRASTIM-CBQV 6 MG/0.6ML ~~LOC~~ SOSY
6.0000 mg | PREFILLED_SYRINGE | Freq: Once | SUBCUTANEOUS | Status: AC
  Administered 2023-02-25: 6 mg via SUBCUTANEOUS
  Filled 2023-02-25: qty 0.6

## 2023-02-26 ENCOUNTER — Encounter: Payer: Self-pay | Admitting: *Deleted

## 2023-03-11 ENCOUNTER — Other Ambulatory Visit: Payer: Self-pay | Admitting: Hematology and Oncology

## 2023-03-13 MED FILL — Dexamethasone Sodium Phosphate Inj 100 MG/10ML: INTRAMUSCULAR | Qty: 1 | Status: AC

## 2023-03-13 MED FILL — Fosaprepitant Dimeglumine For IV Infusion 150 MG (Base Eq): INTRAVENOUS | Qty: 5 | Status: AC

## 2023-03-16 ENCOUNTER — Inpatient Hospital Stay: Payer: Medicare HMO | Attending: Physician Assistant

## 2023-03-16 ENCOUNTER — Inpatient Hospital Stay: Payer: Medicare HMO

## 2023-03-16 ENCOUNTER — Inpatient Hospital Stay: Payer: Medicare HMO | Admitting: Hematology and Oncology

## 2023-03-16 ENCOUNTER — Encounter: Payer: Self-pay | Admitting: Hematology and Oncology

## 2023-03-16 DIAGNOSIS — Z79899 Other long term (current) drug therapy: Secondary | ICD-10-CM | POA: Insufficient documentation

## 2023-03-16 DIAGNOSIS — C50411 Malignant neoplasm of upper-outer quadrant of right female breast: Secondary | ICD-10-CM | POA: Diagnosis not present

## 2023-03-16 DIAGNOSIS — Z170 Estrogen receptor positive status [ER+]: Secondary | ICD-10-CM | POA: Diagnosis not present

## 2023-03-16 DIAGNOSIS — D696 Thrombocytopenia, unspecified: Secondary | ICD-10-CM | POA: Diagnosis not present

## 2023-03-16 DIAGNOSIS — Z5111 Encounter for antineoplastic chemotherapy: Secondary | ICD-10-CM | POA: Diagnosis not present

## 2023-03-16 DIAGNOSIS — R7989 Other specified abnormal findings of blood chemistry: Secondary | ICD-10-CM

## 2023-03-16 DIAGNOSIS — Z95828 Presence of other vascular implants and grafts: Secondary | ICD-10-CM

## 2023-03-16 LAB — CMP (CANCER CENTER ONLY)
ALT: 13 U/L (ref 0–44)
AST: 18 U/L (ref 15–41)
Albumin: 4.1 g/dL (ref 3.5–5.0)
Alkaline Phosphatase: 102 U/L (ref 38–126)
Anion gap: 4 — ABNORMAL LOW (ref 5–15)
BUN: 13 mg/dL (ref 6–20)
CO2: 31 mmol/L (ref 22–32)
Calcium: 9.3 mg/dL (ref 8.9–10.3)
Chloride: 103 mmol/L (ref 98–111)
Creatinine: 0.76 mg/dL (ref 0.44–1.00)
GFR, Estimated: >60 mL/min (ref >60)
Glucose, Bld: 92 mg/dL (ref 70–99)
Potassium: 3.6 mmol/L (ref 3.5–5.1)
Sodium: 138 mmol/L (ref 135–145)
Total Bilirubin: 0.2 mg/dL — ABNORMAL LOW (ref 0.3–1.2)
Total Protein: 7.2 g/dL (ref 6.5–8.1)

## 2023-03-16 LAB — CBC WITH DIFFERENTIAL (CANCER CENTER ONLY)
Abs Immature Granulocytes: 0.06 10*3/uL (ref 0.00–0.07)
Basophils Absolute: 0 10*3/uL (ref 0.0–0.1)
Basophils Relative: 0 %
Eosinophils Absolute: 0.1 10*3/uL (ref 0.0–0.5)
Eosinophils Relative: 1 %
HCT: 28 % — ABNORMAL LOW (ref 36.0–46.0)
Hemoglobin: 9.2 g/dL — ABNORMAL LOW (ref 12.0–15.0)
Immature Granulocytes: 1 %
Lymphocytes Relative: 12 %
Lymphs Abs: 0.7 10*3/uL (ref 0.7–4.0)
MCH: 31 pg (ref 26.0–34.0)
MCHC: 32.9 g/dL (ref 30.0–36.0)
MCV: 94.3 fL (ref 80.0–100.0)
Monocytes Absolute: 0.7 10*3/uL (ref 0.1–1.0)
Monocytes Relative: 12 %
Neutro Abs: 4.2 10*3/uL (ref 1.7–7.7)
Neutrophils Relative %: 74 %
Platelet Count: 225 10*3/uL (ref 150–400)
RBC: 2.97 MIL/uL — ABNORMAL LOW (ref 3.87–5.11)
RDW: 16.5 % — ABNORMAL HIGH (ref 11.5–15.5)
WBC Count: 5.7 10*3/uL (ref 4.0–10.5)
nRBC: 0 % (ref 0.0–0.2)

## 2023-03-16 LAB — TSH: TSH: 53.35 u[IU]/mL — ABNORMAL HIGH (ref 0.350–4.500)

## 2023-03-16 LAB — T4, FREE: Free T4: 0.39 ng/dL — ABNORMAL LOW (ref 0.61–1.12)

## 2023-03-16 LAB — PREGNANCY, URINE: Preg Test, Ur: NEGATIVE

## 2023-03-16 MED ORDER — SODIUM CHLORIDE 0.9 % IV SOLN: Freq: Once | INTRAVENOUS | Status: AC

## 2023-03-16 MED ORDER — SODIUM CHLORIDE 0.9 % IV SOLN
150.0000 mg | Freq: Once | INTRAVENOUS | Status: AC
  Administered 2023-03-16: 150 mg via INTRAVENOUS
  Filled 2023-03-16: qty 150

## 2023-03-16 MED ORDER — SODIUM CHLORIDE 0.9 % IV SOLN
10.0000 mg | Freq: Once | INTRAVENOUS | Status: AC
  Administered 2023-03-16: 10 mg via INTRAVENOUS
  Filled 2023-03-16: qty 10

## 2023-03-16 MED ORDER — SODIUM CHLORIDE 0.9 % IV SOLN
200.0000 mg | Freq: Once | INTRAVENOUS | Status: AC
  Administered 2023-03-16: 200 mg via INTRAVENOUS
  Filled 2023-03-16: qty 200

## 2023-03-16 MED ORDER — SODIUM CHLORIDE 0.9% FLUSH
10.0000 mL | Freq: Once | INTRAVENOUS | Status: AC
  Administered 2023-03-16: 10 mL

## 2023-03-16 MED ORDER — SODIUM CHLORIDE 0.9% FLUSH
10.0000 mL | INTRAVENOUS | Status: DC | PRN
  Administered 2023-03-16: 10 mL

## 2023-03-16 MED ORDER — PALONOSETRON HCL INJECTION 0.25 MG/5ML
0.2500 mg | Freq: Once | INTRAVENOUS | Status: AC
  Administered 2023-03-16: 0.25 mg via INTRAVENOUS
  Filled 2023-03-16: qty 5

## 2023-03-16 MED ORDER — HEPARIN SOD (PORK) LOCK FLUSH 100 UNIT/ML IV SOLN
500.0000 [IU] | Freq: Once | INTRAVENOUS | Status: AC | PRN
  Administered 2023-03-16: 500 [IU]

## 2023-03-16 MED ORDER — SODIUM CHLORIDE 0.9 % IV SOLN
400.0000 mg/m2 | Freq: Once | INTRAVENOUS | Status: AC
  Administered 2023-03-16: 700 mg via INTRAVENOUS
  Filled 2023-03-16: qty 35

## 2023-03-16 MED ORDER — DOXORUBICIN HCL CHEMO IV INJECTION 2 MG/ML
40.0000 mg/m2 | Freq: Once | INTRAVENOUS | Status: AC
  Administered 2023-03-16: 70 mg via INTRAVENOUS
  Filled 2023-03-16: qty 35

## 2023-03-16 NOTE — Assessment & Plan Note (Signed)
This is a very pleasant 52 year old female patient with no significant past medical history, significant family history of breast cancer in sister who died at the age of 53 from metastatic breast cancer referred to [facility] with a new palpable lump biopsy-proven invasive carcinoma, high-grade, ER staining, PR negative and HER2 negative with a high proliferation index.  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  She has completed 2 cycles of AC with Keytruda.  She complains of severe nausea and inability to eat hence we have dose reduced it by 20%.  She otherwise had a great response, complete clinical response with no palpable mass.  She will proceed with chemotherapy as scheduled and follow-up with Dr. Magnus Ivan after completion of chemotherapy with post neoadjuvant MRI.  She is very thrilled by the response so far. She will return to clinic to see me back after surgery to review final pathology report and to discuss adjuvant recommendations.

## 2023-03-16 NOTE — Patient Instructions (Signed)
[facility]  Discharge Instructions: Thank you for choosing [facility] to provide your oncology and hematology care.   If you have a lab appointment with the Cancer Center, please go directly to the Cancer Center and check in at the registration area.   Wear comfortable clothing and clothing appropriate for easy access to any Portacath or PICC line.   We strive to give you quality time with your provider. You may need to reschedule your appointment if you arrive late (15 or more minutes).  Arriving late affects you and other patients whose appointments are after yours.  Also, if you miss three or more appointments without notifying the office, you may be dismissed from the clinic at the provider's discretion.      For prescription refill requests, have your pharmacy contact our office and allow 72 hours for refills to be completed.    Today you received the following chemotherapy and/or immunotherapy agents Keytruda. Adriamycin, Cytoxan.      To help prevent nausea and vomiting after your treatment, we encourage you to take your nausea medication as directed.  BELOW ARE SYMPTOMS THAT SHOULD BE REPORTED IMMEDIATELY: *FEVER GREATER THAN 100.4 F (38 C) OR HIGHER *CHILLS OR SWEATING *NAUSEA AND VOMITING THAT IS NOT CONTROLLED WITH YOUR NAUSEA MEDICATION *UNUSUAL SHORTNESS OF BREATH *UNUSUAL BRUISING OR BLEEDING *URINARY PROBLEMS (pain or burning when urinating, or frequent urination) *BOWEL PROBLEMS (unusual diarrhea, constipation, pain near the anus) TENDERNESS IN MOUTH AND THROAT WITH OR WITHOUT PRESENCE OF ULCERS (sore throat, sores in mouth, or a toothache) UNUSUAL RASH, SWELLING OR PAIN  UNUSUAL VAGINAL DISCHARGE OR ITCHING   Items with * indicate a potential emergency and should be followed up as soon as possible or go to the Emergency Department if any problems should occur.  Please show the CHEMOTHERAPY ALERT CARD or IMMUNOTHERAPY  ALERT CARD at check-in to the Emergency Department and triage nurse.  Should you have questions after your visit or need to cancel or reschedule your appointment, please contact [facility]  Dept: [phone]  and follow the prompts.  Office hours are 8:00 a.m. to 4:30 p.m. Monday - Friday. Please note that voicemails left after 4:00 p.m. may not be returned until the following business day.  We are closed weekends and major holidays. You have access to a nurse at all times for urgent questions. Please call the main number to the clinic Dept: [phone] and follow the prompts.   For any non-urgent questions, you may also contact your provider using MyChart. We now offer e-Visits for anyone 6 and older to request care online for non-urgent symptoms. For details visit [email or web address].   Also download the MyChart app! Go to the app store, search "MyChart", open the app, select [facility], and log in with your MyChart username and password.

## 2023-03-16 NOTE — Progress Notes (Signed)
[facility] Cancer Follow up:    Maria Shackleton, NP-C [address] [place] [address]   DIAGNOSIS:  Cancer Staging  Malignant neoplasm of upper-outer quadrant of right female breast [facility]) Staging form: Breast, AJCC 8th Edition - Clinical: Stage IIB (cT2, cN0, cM0, G3, ER+, PR-, HER2-) - Signed by [name], MD on 10/08/2022 Stage prefix: Initial diagnosis Histologic grading system: 3 grade system   SUMMARY OF ONCOLOGIC HISTORY: Oncology History  Malignant neoplasm of upper-outer quadrant of right female breast (HCC)  09/26/2022 Mammogram   Patient had a palpable right breast mass and hence underwent bilateral diagnostic mammogram.  This confirmed a suspicious 3 cm right upper outer quadrant mass and an abnormal right axillary lymph node with cortical thickening.  Tissue sampling of both the breast mass and axillary lymph node were recommended.  Left breast was normal.   09/26/2022 Breast US   Ultrasound breast confirmed the above-mentioned findings.   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  Prognostic showed ER 50% positive weak staining PR 0% negative HER2 1+ by IHC and Ki-67 of 70%   10/06/2022 Initial Diagnosis   Malignant neoplasm of upper-outer quadrant of right female breast (HCC)   10/08/2022 Cancer Staging   Staging form: Breast, AJCC 8th Edition - Clinical: Stage IIB (cT2, cN0, cM0, G3, ER+, PR-, HER2-) - Signed by [name], MD on 10/08/2022 Stage prefix: Initial diagnosis Histologic grading system: 3 grade system   10/24/2022 - 11/14/2022 Chemotherapy   Patient is on Treatment Plan : BREAST Pembrolizumab (200) D1 + Carboplatin (5) D1 + Paclitaxel (80) D1,8,15 q21d X 4 cycles / Pembrolizumab (200) D1 + AC D1 q21d x 4 cycles      Genetic Testing   [facility] Common Cancer Panel+RNA was Negative. Report date is 10/16/2022.  The Common Hereditary Cancers Panel offered by [facility]  includes sequencing and/or deletion duplication testing of the following 48 genes: APC, ATM, AXIN2, BAP1, BARD1, BMPR1A, BRCA1, BRCA2, BRIP1, CDH1, CDK4, CDKN2A (p14ARF and p16INK4a only), CHEK2, CTNNA1, DICER1, EPCAM (Deletion/duplication testing only), FH, GREM1 (promoter region duplication testing only), HOXB13, KIT, MBD4, MEN1, MLH1, MSH2, MSH3, MSH6, MUTYH, NF1, NHTL1, PALB2, PDGFRA, PMS2, POLD1, POLE, PTEN, RAD51C, RAD51D, SDHA (sequencing analysis only except exon 14), SDHB, SDHC, SDHD, SMAD4, SMARCA4. STK11, TP53, TSC1, TSC2, and VHL.   11/21/2022 -  Chemotherapy   Patient is on Treatment Plan : BREAST Pembrolizumab (200) D1 + Carboplatin (1.5) D1,8,15 + Paclitaxel (80) D1,8,15 q21d X 4 cycles / Pembrolizumab (200) D1 + AC D1 q21d x 4 cycles       CURRENT THERAPY:  INTERVAL HISTORY:  Maria Carter 52 y.o. female returns for follow-up after receiving her secpnd cycle of Adriamycin, Cytoxan, and Keytruda.  After the last chemotherapy, she had lot of nausea, a couple episodes of vomiting. She coudn't eat much for a couple weeks. She just started feeling better. She has noticed mild constipation, not using a stool softener or laxative. No fevers, has some cold sweats like hot and cold alternately. No new neurological complaints ROS reviewed and neg.  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  MEDICAL HISTORY: Past Medical History:  Diagnosis Date   Anxiety    Breast cancer (HCC)    Depression    Hx of febrile seizure    1980    SURGICAL HISTORY: Past Surgical History:  Procedure Laterality Date   BREAST BIOPSY Right 09/30/2022   [place] RT BREAST BX W LOC DEV 1ST LESION IMG BX SPEC US GUIDE 09/30/2022 [facility] MAMMOGRAPHY   PORTACATH PLACEMENT N/A 10/23/2022   Procedure: INSERTION PORT-A-CATH;  Surgeon: [name], MD;  Location: [facility];  Service: General;  Laterality: N/A;    SOCIAL HISTORY: Social History   Socioeconomic History   Marital status: Single    Spouse name: Not on file   Number of children: Not on file   Years of education: Not on file   Highest education level: Not on file  Occupational History   Not on file  Tobacco Use   Smoking status: Never   Smokeless tobacco: Never  Vaping Use   Vaping status: Never Used  Substance and Sexual Activity   Alcohol use: Yes    Comment: social   Drug use: No   Sexual activity: Not Currently    Partners: Male  Other Topics Concern   Not on file  Social History Narrative   Not on file   Social Determinants of Health   Financial Resource Strain: High Risk (10/08/2022)   Overall Financial Resource Strain (CARDIA)    Difficulty of Paying Living Expenses: Hard  Food Insecurity: Food Insecurity Present (10/08/2022)   Hunger Vital Sign    Worried About Running Out of Food in the Last Year: Sometimes true    Ran Out of Food in the Last Year: Sometimes true  Transportation Needs: No Transportation Needs (10/08/2022)   PRAPARE - Administrator, Civil Service (Medical): No    Lack of Transportation (Non-Medical): No  Physical Activity: Not on file  Stress: Not on file  Social Connections: Not on file  Intimate Partner Violence: Not on  file    FAMILY HISTORY: Family History  Problem Relation Age of Onset   Breast cancer Sister 103 - 16   Cancer Paternal Grandmother        unknown type    Review of Systems  Constitutional:  Positive for appetite change and fatigue. Negative for chills, fever and unexpected weight change.  HENT:   Negative for hearing loss, lump/mass, mouth sores and trouble swallowing.   Eyes:  Negative for eye problems and icterus.  Respiratory:  Negative for chest tightness, cough and shortness of breath.   Cardiovascular:  Negative for chest pain, leg swelling and palpitations.  Gastrointestinal:  Positive for nausea. Negative for abdominal distention, abdominal pain, constipation, diarrhea and vomiting.  Endocrine: Positive for hot flashes.  Genitourinary:  Negative for difficulty urinating.   Musculoskeletal:  Negative for arthralgias.  Skin:  Negative for itching and rash.  Neurological:  Negative for dizziness, extremity weakness, headaches and numbness.  Hematological:  Negative for adenopathy. Does not bruise/bleed easily.  Psychiatric/Behavioral:  Negative for depression. The patient is not  nervous/anxious.       PHYSICAL EXAMINATION     Vitals:   03/16/23 1305  BP: 110/82  Pulse: 67  Resp: 20  Temp: 97.9 F (36.6 C)  SpO2: 100%    Physical Exam Constitutional:      General: She is not in acute distress.    Appearance: Normal appearance. She is not ill-appearing or toxic-appearing.     Comments: Patient appears tired today.  HENT:     Head: Normocephalic and atraumatic.     Mouth/Throat:     Mouth: Mucous membranes are moist.     Pharynx: Oropharynx is clear. No oropharyngeal exudate or posterior oropharyngeal erythema.  Eyes:     General: No scleral icterus. Cardiovascular:     Rate and Rhythm: Normal rate and regular rhythm.     Pulses: Normal pulses.     Heart sounds: Normal heart sounds.  Pulmonary:     Effort: Pulmonary effort is normal.     Breath sounds:  Normal breath sounds.  Abdominal:     General: Abdomen is flat. Bowel sounds are normal. There is no distension.     Palpations: Abdomen is soft.     Tenderness: There is no abdominal tenderness.  Musculoskeletal:        General: No swelling.     Cervical back: Neck supple.  Lymphadenopathy:     Cervical: No cervical adenopathy.  Skin:    General: Skin is warm and dry.     Findings: No rash.  Neurological:     General: No focal deficit present.     Mental Status: She is alert.  Psychiatric:        Mood and Affect: Mood normal.        Behavior: Behavior normal.     LABORATORY DATA:  CBC    Component Value Date/Time   WBC 5.7 03/16/2023 1229   WBC 2.9 (L) 02/28/2022 1049   RBC 2.97 (L) 03/16/2023 1229   HGB 9.2 (L) 03/16/2023 1229   HCT 28.0 (L) 03/16/2023 1229   PLT 225 03/16/2023 1229   MCV 94.3 03/16/2023 1229   MCH 31.0 03/16/2023 1229   MCHC 32.9 03/16/2023 1229   RDW 16.5 (H) 03/16/2023 1229   LYMPHSABS 0.7 03/16/2023 1229   MONOABS 0.7 03/16/2023 1229   EOSABS 0.1 03/16/2023 1229   BASOSABS 0.0 03/16/2023 1229    CMP     Component Value Date/Time   NA 138 03/16/2023 1229   K 3.6 03/16/2023 1229   CL 103 03/16/2023 1229   CO2 31 03/16/2023 1229   GLUCOSE 92 03/16/2023 1229   BUN 13 03/16/2023 1229   CREATININE 0.76 03/16/2023 1229   CALCIUM 9.3 03/16/2023 1229   PROT 7.2 03/16/2023 1229   ALBUMIN 4.1 03/16/2023 1229   AST 18 03/16/2023 1229   ALT 13 03/16/2023 1229   ALKPHOS 102 03/16/2023 1229   BILITOT 0.2 (L) 03/16/2023 1229   GFRNONAA >60 03/16/2023 1229   GFRAA >60 12/23/2015 1151   ASSESSMENT and THERAPY PLAN:   Malignant neoplasm of upper-outer quadrant of right female breast (HCC) This is a very pleasant 52 year old female patient with no significant past medical history, significant family history of breast cancer in sister who died at the age of 72 from metastatic breast cancer referred to breast [facility] with a new palpable lump  biopsy-proven invasive carcinoma, high-grade, ER staining, PR negative and HER2 negative with a high proliferation index.  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  She has completed 2 cycles of AC with Keytruda.  She complains of severe nausea and inability to eat hence we have dose reduced it by 20%.  She otherwise had a great response, complete clinical response with no palpable mass.  She will proceed with chemotherapy as scheduled and follow-up with Dr. Magnus Ivan after completion of chemotherapy with post neoadjuvant MRI.  She is very thrilled by the response so far. She will return to clinic to see me back after surgery to review final pathology report and to discuss adjuvant recommendations.   All questions were answered. The patient knows to call the clinic with any problems, questions or concerns. We can certainly see the patient much sooner if necessary.  Total encounter time:30 minutes*in face-to-face visit time, chart review, lab review, care coordination, order entry, and documentation of the encounter time.  *Total Encounter Time as defined by the Centers for Medicare and Medicaid Services includes, in addition to the face-to-face time of a patient visit (documented in the note above) non-face-to-face time: obtaining and reviewing outside history, ordering and reviewing medications, tests or procedures, care coordination (communications with other health care professionals or caregivers) and documentation in the medical record.

## 2023-03-17 ENCOUNTER — Encounter: Payer: Self-pay | Admitting: *Deleted

## 2023-03-17 ENCOUNTER — Other Ambulatory Visit: Payer: Self-pay | Admitting: *Deleted

## 2023-03-17 DIAGNOSIS — C50411 Malignant neoplasm of upper-outer quadrant of right female breast: Secondary | ICD-10-CM

## 2023-03-18 ENCOUNTER — Other Ambulatory Visit: Payer: Self-pay

## 2023-03-18 ENCOUNTER — Inpatient Hospital Stay: Payer: Medicare HMO

## 2023-03-18 VITALS — BP 112/50 | HR 64 | Temp 98.0°F | Resp 20

## 2023-03-18 DIAGNOSIS — C50411 Malignant neoplasm of upper-outer quadrant of right female breast: Secondary | ICD-10-CM

## 2023-03-18 MED ORDER — PEGFILGRASTIM-CBQV 6 MG/0.6ML ~~LOC~~ SOSY
6.0000 mg | PREFILLED_SYRINGE | Freq: Once | SUBCUTANEOUS | Status: DC
  Filled 2023-03-18: qty 0.6

## 2023-03-25 ENCOUNTER — Encounter: Payer: Self-pay | Admitting: *Deleted

## 2023-03-25 ENCOUNTER — Telehealth: Payer: Self-pay

## 2023-03-25 NOTE — Telephone Encounter (Signed)
-----   Message from Nurse Vikki Ports D sent at 03/24/2023  5:29 PM EDT -----  ----- Message ----- From: [name], MD Sent: 03/17/2023   8:42 AM EDT To: Billey Co, RN  Please find out when she switched to 50 mcg levothyroxine daily. If this is less than 2 weeks, then ok to continue this dose for another 2 weeks and then do labs. If she has already been taking this dose for 2 weeks, then have to uptitrate to 75 mcg.  Thanks,

## 2023-03-25 NOTE — Telephone Encounter (Signed)
Called pt per MD recommendation from 03/16/23. Pt states she has been taking 25 mcg of Levothyroxine and she never did increase to 50 mcg. Advised pt to increase to 50 mcg starting tomorrow and we will r/c her labs 8/5 and titrate per MD from there. She verbalized understanding and agreement.

## 2023-04-03 MED FILL — Fosaprepitant Dimeglumine For IV Infusion 150 MG (Base Eq): INTRAVENOUS | Qty: 5 | Status: AC

## 2023-04-03 MED FILL — Dexamethasone Sodium Phosphate Inj 100 MG/10ML: INTRAMUSCULAR | Qty: 1 | Status: AC

## 2023-04-06 ENCOUNTER — Other Ambulatory Visit: Payer: Self-pay | Admitting: Hematology and Oncology

## 2023-04-06 ENCOUNTER — Other Ambulatory Visit: Payer: Self-pay

## 2023-04-06 ENCOUNTER — Encounter: Payer: Self-pay | Admitting: Adult Health

## 2023-04-06 ENCOUNTER — Inpatient Hospital Stay (HOSPITAL_BASED_OUTPATIENT_CLINIC_OR_DEPARTMENT_OTHER): Payer: Medicare HMO | Admitting: Adult Health

## 2023-04-06 ENCOUNTER — Encounter: Payer: Self-pay | Admitting: Hematology and Oncology

## 2023-04-06 ENCOUNTER — Inpatient Hospital Stay: Payer: Medicare HMO

## 2023-04-06 ENCOUNTER — Other Ambulatory Visit: Payer: Self-pay | Admitting: Pharmacist

## 2023-04-06 ENCOUNTER — Inpatient Hospital Stay: Payer: Medicare HMO | Attending: Physician Assistant

## 2023-04-06 DIAGNOSIS — R21 Rash and other nonspecific skin eruption: Secondary | ICD-10-CM | POA: Insufficient documentation

## 2023-04-06 DIAGNOSIS — Z5111 Encounter for antineoplastic chemotherapy: Secondary | ICD-10-CM | POA: Insufficient documentation

## 2023-04-06 DIAGNOSIS — C50411 Malignant neoplasm of upper-outer quadrant of right female breast: Secondary | ICD-10-CM | POA: Diagnosis not present

## 2023-04-06 DIAGNOSIS — Z79899 Other long term (current) drug therapy: Secondary | ICD-10-CM | POA: Diagnosis not present

## 2023-04-06 DIAGNOSIS — Z95828 Presence of other vascular implants and grafts: Secondary | ICD-10-CM

## 2023-04-06 DIAGNOSIS — Z170 Estrogen receptor positive status [ER+]: Secondary | ICD-10-CM | POA: Insufficient documentation

## 2023-04-06 DIAGNOSIS — Z803 Family history of malignant neoplasm of breast: Secondary | ICD-10-CM | POA: Insufficient documentation

## 2023-04-06 LAB — CBC WITH DIFFERENTIAL (CANCER CENTER ONLY)
Abs Immature Granulocytes: 0.01 10*3/uL (ref 0.00–0.07)
Basophils Absolute: 0 10*3/uL (ref 0.0–0.1)
Basophils Relative: 1 %
Eosinophils Absolute: 0.1 10*3/uL (ref 0.0–0.5)
Eosinophils Relative: 2 %
HCT: 30.5 % — ABNORMAL LOW (ref 36.0–46.0)
Hemoglobin: 10.2 g/dL — ABNORMAL LOW (ref 12.0–15.0)
Immature Granulocytes: 0 %
Lymphocytes Relative: 11 %
Lymphs Abs: 0.4 10*3/uL — ABNORMAL LOW (ref 0.7–4.0)
MCH: 31.8 pg (ref 26.0–34.0)
MCHC: 33.4 g/dL (ref 30.0–36.0)
MCV: 95 fL (ref 80.0–100.0)
Monocytes Absolute: 0.6 10*3/uL (ref 0.1–1.0)
Monocytes Relative: 16 %
Neutro Abs: 2.5 10*3/uL (ref 1.7–7.7)
Neutrophils Relative %: 70 %
Platelet Count: 166 10*3/uL (ref 150–400)
RBC: 3.21 MIL/uL — ABNORMAL LOW (ref 3.87–5.11)
RDW: 15 % (ref 11.5–15.5)
WBC Count: 3.6 10*3/uL — ABNORMAL LOW (ref 4.0–10.5)
nRBC: 0 % (ref 0.0–0.2)

## 2023-04-06 LAB — CMP (CANCER CENTER ONLY)
ALT: 10 U/L (ref 0–44)
AST: 17 U/L (ref 15–41)
Albumin: 4.4 g/dL (ref 3.5–5.0)
Alkaline Phosphatase: 99 U/L (ref 38–126)
Anion gap: 5 (ref 5–15)
BUN: 18 mg/dL (ref 6–20)
CO2: 28 mmol/L (ref 22–32)
Calcium: 9.1 mg/dL (ref 8.9–10.3)
Chloride: 105 mmol/L (ref 98–111)
Creatinine: 0.76 mg/dL (ref 0.44–1.00)
GFR, Estimated: >60 mL/min (ref >60)
Glucose, Bld: 94 mg/dL (ref 70–99)
Potassium: 3.9 mmol/L (ref 3.5–5.1)
Sodium: 138 mmol/L (ref 135–145)
Total Bilirubin: 0.2 mg/dL — ABNORMAL LOW (ref 0.3–1.2)
Total Protein: 7.4 g/dL (ref 6.5–8.1)

## 2023-04-06 LAB — PREGNANCY, URINE: Preg Test, Ur: NEGATIVE

## 2023-04-06 MED ORDER — PALONOSETRON HCL INJECTION 0.25 MG/5ML
0.2500 mg | Freq: Once | INTRAVENOUS | Status: AC
  Administered 2023-04-06: 0.25 mg via INTRAVENOUS
  Filled 2023-04-06: qty 5

## 2023-04-06 MED ORDER — HEPARIN SOD (PORK) LOCK FLUSH 100 UNIT/ML IV SOLN
500.0000 [IU] | Freq: Once | INTRAVENOUS | Status: AC | PRN
  Administered 2023-04-06: 500 [IU]

## 2023-04-06 MED ORDER — DOXORUBICIN HCL CHEMO IV INJECTION 2 MG/ML
40.0000 mg/m2 | Freq: Once | INTRAVENOUS | Status: AC
  Administered 2023-04-06: 70 mg via INTRAVENOUS
  Filled 2023-04-06: qty 35

## 2023-04-06 MED ORDER — SODIUM CHLORIDE 0.9% FLUSH
10.0000 mL | Freq: Once | INTRAVENOUS | Status: AC
  Administered 2023-04-06: 10 mL

## 2023-04-06 MED ORDER — SODIUM CHLORIDE 0.9 % IV SOLN
200.0000 mg | Freq: Once | INTRAVENOUS | Status: AC
  Administered 2023-04-06: 200 mg via INTRAVENOUS
  Filled 2023-04-06: qty 200

## 2023-04-06 MED ORDER — SODIUM CHLORIDE 0.9% FLUSH
10.0000 mL | INTRAVENOUS | Status: DC | PRN
  Administered 2023-04-06: 10 mL

## 2023-04-06 MED ORDER — SODIUM CHLORIDE 0.9 % IV SOLN
400.0000 mg/m2 | Freq: Once | INTRAVENOUS | Status: AC
  Administered 2023-04-06: 700 mg via INTRAVENOUS
  Filled 2023-04-06: qty 35

## 2023-04-06 MED ORDER — SODIUM CHLORIDE 0.9 % IV SOLN: Freq: Once | INTRAVENOUS | Status: AC

## 2023-04-06 MED ORDER — SODIUM CHLORIDE 0.9 % IV SOLN
150.0000 mg | Freq: Once | INTRAVENOUS | Status: AC
  Administered 2023-04-06: 150 mg via INTRAVENOUS
  Filled 2023-04-06: qty 150

## 2023-04-06 MED ORDER — SODIUM CHLORIDE 0.9 % IV SOLN
10.0000 mg | Freq: Once | INTRAVENOUS | Status: AC
  Administered 2023-04-06: 10 mg via INTRAVENOUS
  Filled 2023-04-06: qty 10

## 2023-04-06 NOTE — Patient Instructions (Signed)

## 2023-04-06 NOTE — Progress Notes (Signed)
Cancer Center Cancer Follow up:    Maria Shackleton, NP-C [address] 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  This confirmed a suspicious 3 cm right upper outer quadrant mass and an abnormal right axillary lymph node with cortical thickening.  Tissue sampling of both the breast mass and axillary lymph node were recommended.  Left breast was normal.   09/26/2022 Breast US   Ultrasound breast confirmed the above-mentioned findings.   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  Prognostic showed ER 50% positive weak staining PR 0% negative HER2 1+ by IHC and Ki-67 of 70%   10/06/2022 Initial Diagnosis   Malignant neoplasm of upper-outer quadrant of right female breast (HCC)   10/08/2022 Cancer Staging   Staging form: Breast, AJCC 8th Edition - Clinical: Stage IIB (cT2, cN0, cM0, G3, ER+, PR-, HER2-) - Signed by [name], MD on 10/08/2022 Stage prefix: Initial diagnosis Histologic grading system: 3 grade system   10/24/2022 - 11/14/2022 Chemotherapy   Patient is on Treatment Plan : BREAST Pembrolizumab (200) D1 + Carboplatin (5) D1 + Paclitaxel (80) D1,8,15 q21d X 4 cycles / Pembrolizumab (200) D1 + AC D1 q21d x 4 cycles      Genetic Testing   [facility] Common Cancer Panel+RNA was Negative. Report date is 10/16/2022.  The Common Hereditary Cancers Panel offered by [facility]  includes sequencing and/or deletion duplication testing of the following 48 genes: APC, ATM, AXIN2, BAP1, BARD1, BMPR1A, BRCA1, BRCA2, BRIP1, CDH1, CDK4, CDKN2A (p14ARF and p16INK4a only), CHEK2, CTNNA1, DICER1, EPCAM (Deletion/duplication testing only), FH, GREM1 (promoter region duplication testing only), HOXB13, KIT, MBD4, MEN1, MLH1, MSH2, MSH3, MSH6, MUTYH, NF1, NHTL1, PALB2, PDGFRA, PMS2, POLD1, POLE, PTEN, RAD51C, RAD51D, SDHA (sequencing analysis only except exon 14), SDHB, SDHC, SDHD, SMAD4, SMARCA4. STK11, TP53, TSC1, TSC2, and VHL.   11/21/2022 -  Chemotherapy   Patient is on Treatment Plan : BREAST Pembrolizumab (200) D1 + Carboplatin (1.5) D1,8,15 + Paclitaxel (80) D1,8,15 q21d X 4 cycles / Pembrolizumab (200) D1 + AC D1 q21d x 4 cycles     04/28/2023 -  Chemotherapy   Patient is on Treatment Plan : BREAST Pembrolizumab (200) q21d x 27 weeks       CURRENT THERAPY: Keytruda, Adriamycin, Cytoxan   INTERVAL HISTORY: Maria Carter 52 y.o. female returns for follow-up prior to receiving her final treatment of neoadjuvant chemotherapy.  She endorses fatigue. She is scheduled to undergo post-neoadjuvant MRI on 04/09/2023 and f/u with her surgeon, Dr. Magnus Ivan on 04/17/2023.       Patient Active Problem List   Diagnosis Date Noted   Port-A-Cath in place 10/24/2022   Genetic testing 10/16/2022   Malignant neoplasm of upper-outer quadrant of right female breast (HCC) 10/06/2022   Thrombocytopenia (HCC) 03/05/2022   Leukopenia 03/05/2022   Hot flashes 02/28/2022   Hair thinning 02/28/2022   Amenorrhea 02/28/2022   Screening for colon cancer 02/28/2022   Multiple nevi 02/28/2022  PTSD (post-traumatic stress disorder) 02/08/2021   Mild episode of recurrent major depressive disorder (HCC) 09/12/2013   Attention deficit disorder 04/25/2013   Migraine 02/05/2013   KELOID SCAR 05/09/2010   URINALYSIS, ABNORMAL 05/09/2010   ANXIETY 03/01/2010   DEPRESSION 03/01/2010   HEADACHE 03/01/2010    Intermittent chest pain 03/01/2010    has No Known Allergies.  MEDICAL HISTORY: Past Medical History:  Diagnosis Date   Anxiety    Breast cancer (HCC)    Depression    Hx of febrile seizure    1980    SURGICAL HISTORY: Past Surgical History:  Procedure Laterality Date   BREAST BIOPSY Right 09/30/2022   [place] RT BREAST BX W LOC DEV 1ST LESION IMG BX SPEC US GUIDE 09/30/2022 [facility] MAMMOGRAPHY   PORTACATH PLACEMENT N/A 10/23/2022   Procedure: INSERTION PORT-A-CATH;  Surgeon: [name], MD;  Location: [facility];  Service: General;  Laterality: N/A;    SOCIAL HISTORY: Social History   Socioeconomic History   Marital status: Single    Spouse name: Not on file   Number of children: Not on file   Years of education: Not on file   Highest education level: Not on file  Occupational History   Not on file  Tobacco Use   Smoking status: Never   Smokeless tobacco: Never  Vaping Use   Vaping status: Never Used  Substance and Sexual Activity   Alcohol use: Yes    Comment: social   Drug use: No   Sexual activity: Not Currently    Partners: Male  Other Topics Concern   Not on file  Social History Narrative   Not on file   Social Determinants of Health   Financial Resource Strain: High Risk (10/08/2022)   Overall Financial Resource Strain (CARDIA)    Difficulty of Paying Living Expenses: Hard  Food Insecurity: Food Insecurity Present (10/08/2022)   Hunger Vital Sign    Worried About Running Out of Food in the Last Year: Sometimes true    Ran Out of Food in the Last Year: Sometimes true  Transportation Needs: No Transportation Needs (10/08/2022)   PRAPARE - Administrator, Civil Service (Medical): No    Lack of Transportation (Non-Medical): No  Physical Activity: Not on file  Stress: Not on file  Social Connections: Not on file  Intimate Partner Violence: Not on file    FAMILY HISTORY: Family History  Problem Relation Age of Onset    Breast cancer Sister 50 - 23   Cancer Paternal Grandmother        unknown type    Review of Systems  Constitutional:  Negative for appetite change, chills, fatigue, fever and unexpected weight change.  HENT:   Negative for hearing loss, lump/mass and trouble swallowing.   Eyes:  Negative for eye problems and icterus.  Respiratory:  Negative for chest tightness, cough and shortness of breath.   Cardiovascular:  Negative for chest pain, leg swelling and palpitations.  Gastrointestinal:  Negative for abdominal distention, abdominal pain, constipation, diarrhea, nausea and vomiting.  Endocrine: Negative for hot flashes.  Genitourinary:  Negative for difficulty urinating.   Musculoskeletal:  Negative for arthralgias.  Skin:  Negative for itching and rash.  Neurological:  Negative for dizziness, extremity weakness, headaches and numbness.  Hematological:  Negative for adenopathy. Does not bruise/bleed easily.  Psychiatric/Behavioral:  Negative for depression. The patient is not nervous/anxious.       PHYSICAL EXAMINATION     Vitals:  04/06/23 1230  BP: (!) 119/45  Pulse: 70  Resp: 18  Temp: 98.1 F (36.7 C)  SpO2: 100%    Physical Exam Constitutional:      General: She is not in acute distress.    Appearance: Normal appearance. She is not toxic-appearing.  HENT:     Head: Normocephalic and atraumatic.     Mouth/Throat:     Mouth: Mucous membranes are moist.     Pharynx: Oropharynx is clear. No oropharyngeal exudate or posterior oropharyngeal erythema.  Eyes:     General: No scleral icterus. Cardiovascular:     Rate and Rhythm: Normal rate and regular rhythm.     Pulses: Normal pulses.     Heart sounds: Normal heart sounds.  Pulmonary:     Effort: Pulmonary effort is normal.     Breath sounds: Normal breath sounds.  Abdominal:     General: Abdomen is flat. Bowel sounds are normal. There is no distension.     Palpations: Abdomen is soft.     Tenderness: There is no  abdominal tenderness.  Musculoskeletal:        General: No swelling.     Cervical back: Neck supple.  Lymphadenopathy:     Cervical: No cervical adenopathy.  Skin:    General: Skin is warm and dry.     Findings: No rash.  Neurological:     General: No focal deficit present.     Mental Status: She is alert.  Psychiatric:        Mood and Affect: Mood normal.        Behavior: Behavior normal.     LABORATORY DATA:  CBC    Component Value Date/Time   WBC 3.6 (L) 04/06/2023 1208   WBC 2.9 (L) 02/28/2022 1049   RBC 3.21 (L) 04/06/2023 1208   HGB 10.2 (L) 04/06/2023 1208   HCT 30.5 (L) 04/06/2023 1208   PLT 166 04/06/2023 1208   MCV 95.0 04/06/2023 1208   MCH 31.8 04/06/2023 1208   MCHC 33.4 04/06/2023 1208   RDW 15.0 04/06/2023 1208   LYMPHSABS 0.4 (L) 04/06/2023 1208   MONOABS 0.6 04/06/2023 1208   EOSABS 0.1 04/06/2023 1208   BASOSABS 0.0 04/06/2023 1208    CMP     Component Value Date/Time   NA 138 04/06/2023 1208   K 3.9 04/06/2023 1208   CL 105 04/06/2023 1208   CO2 28 04/06/2023 1208   GLUCOSE 94 04/06/2023 1208   BUN 18 04/06/2023 1208   CREATININE 0.76 04/06/2023 1208   CALCIUM 9.1 04/06/2023 1208   PROT 7.4 04/06/2023 1208   ALBUMIN 4.4 04/06/2023 1208   AST 17 04/06/2023 1208   ALT 10 04/06/2023 1208   ALKPHOS 99 04/06/2023 1208   BILITOT 0.2 (L) 04/06/2023 1208   GFRNONAA >60 04/06/2023 1208   GFRAA >60 12/23/2015 1151          ASSESSMENT and THERAPY PLAN:   Malignant neoplasm of upper-outer quadrant of right female breast (HCC) Maria Carter is a 52 year old woman with 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  Current treatment: Keytruda, Adriamycin, Cytoxan  Fatigue: Recommended energy conservation.  This is worse from prior. Nausea: managed with  anti nausea medications At risk for heart failure: Echo pre-chemo was normal  Maria Carter will return in 3 weeks for maintenance Keytruda.  We  will see her before this visit.      All questions were answered. The patient knows to call the clinic with any problems, questions or concerns. We can certainly see the patient much sooner if necessary.  Total encounter time:30 minutes*in face-to-face visit time, chart review, lab review, care coordination, order entry, and documentation of the encounter time.    Lillard Anes, NP 04/08/23 9:52 PM Medical Oncology and Hematology [facility] [address] [place], [place] [address] Tel. [phone]    Fax. [phone]  *Total Encounter Time as defined by the Centers for Medicare and Medicaid Services includes, in addition to the face-to-face time of a patient visit (documented in the note above) non-face-to-face time: obtaining and reviewing outside history, ordering and reviewing medications, tests or procedures, care coordination (communications with other health care professionals or caregivers) and documentation in the medical record.

## 2023-04-06 NOTE — Patient Instructions (Signed)
[facility]  Discharge Instructions: Thank you for choosing [facility] to provide your oncology and hematology care.   If you have a lab appointment with the Cancer Center, please go directly to the Cancer Center and check in at the registration area.   Wear comfortable clothing and clothing appropriate for easy access to any Portacath or PICC line.   We strive to give you quality time with your provider. You may need to reschedule your appointment if you arrive late (15 or more minutes).  Arriving late affects you and other patients whose appointments are after yours.  Also, if you miss three or more appointments without notifying the office, you may be dismissed from the clinic at the provider's discretion.      For prescription refill requests, have your pharmacy contact our office and allow 72 hours for refills to be completed.    Today you received the following chemotherapy and/or immunotherapy agents: Keytruda, Adriamycin, and Cytoxan      To help prevent nausea and vomiting after your treatment, we encourage you to take your nausea medication as directed.  BELOW ARE SYMPTOMS THAT SHOULD BE REPORTED IMMEDIATELY: *FEVER GREATER THAN 100.4 F (38 C) OR HIGHER *CHILLS OR SWEATING *NAUSEA AND VOMITING THAT IS NOT CONTROLLED WITH YOUR NAUSEA MEDICATION *UNUSUAL SHORTNESS OF BREATH *UNUSUAL BRUISING OR BLEEDING *URINARY PROBLEMS (pain or burning when urinating, or frequent urination) *BOWEL PROBLEMS (unusual diarrhea, constipation, pain near the anus) TENDERNESS IN MOUTH AND THROAT WITH OR WITHOUT PRESENCE OF ULCERS (sore throat, sores in mouth, or a toothache) UNUSUAL RASH, SWELLING OR PAIN  UNUSUAL VAGINAL DISCHARGE OR ITCHING   Items with * indicate a potential emergency and should be followed up as soon as possible or go to the Emergency Department if any problems should occur.  Please show the CHEMOTHERAPY ALERT CARD or  IMMUNOTHERAPY ALERT CARD at check-in to the Emergency Department and triage nurse.  Should you have questions after your visit or need to cancel or reschedule your appointment, please contact [facility]  Dept: [phone]  and follow the prompts.  Office hours are 8:00 a.m. to 4:30 p.m. Monday - Friday. Please note that voicemails left after 4:00 p.m. may not be returned until the following business day.  We are closed weekends and major holidays. You have access to a nurse at all times for urgent questions. Please call the main number to the clinic Dept: [phone] and follow the prompts.   For any non-urgent questions, you may also contact your provider using MyChart. We now offer e-Visits for anyone 58 and older to request care online for non-urgent symptoms. For details visit [email or web address].   Also download the MyChart app! Go to the app store, search "MyChart", open the app, select , and log in with your MyChart username and password.

## 2023-04-08 ENCOUNTER — Inpatient Hospital Stay: Payer: Medicare HMO

## 2023-04-08 ENCOUNTER — Encounter: Payer: Self-pay | Admitting: Hematology and Oncology

## 2023-04-08 VITALS — BP 104/42 | HR 61 | Temp 98.9°F | Resp 18

## 2023-04-08 DIAGNOSIS — R21 Rash and other nonspecific skin eruption: Secondary | ICD-10-CM | POA: Diagnosis not present

## 2023-04-08 DIAGNOSIS — C50411 Malignant neoplasm of upper-outer quadrant of right female breast: Secondary | ICD-10-CM

## 2023-04-08 DIAGNOSIS — Z5111 Encounter for antineoplastic chemotherapy: Secondary | ICD-10-CM | POA: Diagnosis not present

## 2023-04-08 DIAGNOSIS — Z170 Estrogen receptor positive status [ER+]: Secondary | ICD-10-CM | POA: Diagnosis not present

## 2023-04-08 DIAGNOSIS — Z803 Family history of malignant neoplasm of breast: Secondary | ICD-10-CM | POA: Diagnosis not present

## 2023-04-08 DIAGNOSIS — Z79899 Other long term (current) drug therapy: Secondary | ICD-10-CM | POA: Diagnosis not present

## 2023-04-08 MED ORDER — PEGFILGRASTIM-CBQV 6 MG/0.6ML ~~LOC~~ SOSY
6.0000 mg | PREFILLED_SYRINGE | Freq: Once | SUBCUTANEOUS | Status: AC
  Administered 2023-04-08: 6 mg via SUBCUTANEOUS
  Filled 2023-04-08: qty 0.6

## 2023-04-08 NOTE — Assessment & Plan Note (Signed)
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  Current treatment: Keytruda, Adriamycin, Cytoxan  Fatigue: Recommended energy conservation.  This is worse from prior. Nausea: managed with anti nausea medications At risk for heart failure: Echo pre-chemo was normal  Chanah will return in 3 weeks for maintenance Keytruda.  We will see her before this visit.

## 2023-04-09 ENCOUNTER — Other Ambulatory Visit: Payer: Medicare HMO

## 2023-04-10 ENCOUNTER — Other Ambulatory Visit: Payer: Self-pay

## 2023-04-12 ENCOUNTER — Ambulatory Visit
Admission: RE | Admit: 2023-04-12 | Discharge: 2023-04-12 | Disposition: A | Discharge status: Home / Self Care | Payer: Medicare HMO | Source: Ambulatory Visit | Attending: Hematology and Oncology | Admitting: Hematology and Oncology

## 2023-04-12 DIAGNOSIS — C50411 Malignant neoplasm of upper-outer quadrant of right female breast: Secondary | ICD-10-CM | POA: Diagnosis not present

## 2023-04-12 MED ORDER — GADOPICLENOL 0.5 MMOL/ML IV SOLN
8.0000 mL | Freq: Once | INTRAVENOUS | Status: AC | PRN
  Administered 2023-04-12: 8 mL via INTRAVENOUS

## 2023-04-13 ENCOUNTER — Encounter: Payer: Self-pay | Admitting: *Deleted

## 2023-04-14 ENCOUNTER — Encounter: Payer: Self-pay | Admitting: *Deleted

## 2023-04-16 ENCOUNTER — Encounter: Payer: Self-pay | Admitting: Hematology and Oncology

## 2023-04-17 ENCOUNTER — Other Ambulatory Visit: Payer: Self-pay | Admitting: Surgery

## 2023-04-17 DIAGNOSIS — C50911 Malignant neoplasm of unspecified site of right female breast: Secondary | ICD-10-CM | POA: Diagnosis not present

## 2023-04-17 DIAGNOSIS — Z853 Personal history of malignant neoplasm of breast: Secondary | ICD-10-CM

## 2023-04-20 ENCOUNTER — Other Ambulatory Visit: Payer: Self-pay | Admitting: Surgery

## 2023-04-20 ENCOUNTER — Other Ambulatory Visit: Payer: Self-pay | Admitting: Hematology and Oncology

## 2023-04-20 DIAGNOSIS — Z853 Personal history of malignant neoplasm of breast: Secondary | ICD-10-CM

## 2023-04-21 ENCOUNTER — Other Ambulatory Visit: Payer: Self-pay

## 2023-04-21 ENCOUNTER — Other Ambulatory Visit: Payer: Self-pay | Admitting: Pharmacist

## 2023-04-21 ENCOUNTER — Encounter: Payer: Self-pay | Admitting: *Deleted

## 2023-04-21 ENCOUNTER — Other Ambulatory Visit: Payer: Self-pay | Admitting: Hematology and Oncology

## 2023-04-21 DIAGNOSIS — C50411 Malignant neoplasm of upper-outer quadrant of right female breast: Secondary | ICD-10-CM

## 2023-04-22 ENCOUNTER — Telehealth: Payer: Self-pay | Admitting: Radiation Oncology

## 2023-04-22 NOTE — Telephone Encounter (Signed)
8/21 @ 9:48 am Called patient to be schedule for consult, but not available at this time.  Patient would like to call back to be schedule.  Waiting on call back.

## 2023-04-23 ENCOUNTER — Other Ambulatory Visit: Payer: Self-pay | Admitting: Hematology and Oncology

## 2023-04-23 ENCOUNTER — Other Ambulatory Visit: Payer: Self-pay

## 2023-04-24 ENCOUNTER — Other Ambulatory Visit: Payer: Self-pay

## 2023-04-27 ENCOUNTER — Other Ambulatory Visit: Payer: Self-pay

## 2023-04-28 ENCOUNTER — Telehealth: Payer: Self-pay

## 2023-04-28 DIAGNOSIS — R7989 Other specified abnormal findings of blood chemistry: Secondary | ICD-10-CM

## 2023-04-28 DIAGNOSIS — C50411 Malignant neoplasm of upper-outer quadrant of right female breast: Secondary | ICD-10-CM

## 2023-04-28 MED ORDER — LEVOTHYROXINE SODIUM 25 MCG PO TABS
50.0000 ug | ORAL_TABLET | Freq: Every day | ORAL | 3 refills | Status: DC
Start: 2023-04-28 — End: 2023-09-03

## 2023-04-28 NOTE — Telephone Encounter (Signed)
Maria Carter called because she has a rash on her arms, stomach and sides that is itchy.  No open lesions are present. She has to arrange a ride and agreed to see Symptom Management on Friday at 4 pm. Appt made and patient aware.  Patient also needs synthroid refilled and prescription refill made. Lorayne Marek, RN

## 2023-04-29 ENCOUNTER — Telehealth: Payer: Self-pay

## 2023-04-29 ENCOUNTER — Other Ambulatory Visit: Payer: Self-pay

## 2023-04-29 NOTE — Telephone Encounter (Signed)
RN called patient to reschedule [facility] appointment to an earlier time. Patient verbalized understanding of new time.

## 2023-04-30 NOTE — Progress Notes (Signed)
Symptom Management Consult Note [facility]    Patient Care Team: [name], NP-C as PCP - General (Family Medicine) [name], MD as Consulting Physician (General Surgery) [name], MD as Consulting Physician (Hematology and Oncology) Dorothy Puffer, MD as Consulting Physician (Radiation Oncology) Pershing Proud, RN as Oncology Nurse Navigator Donnelly Angelica, RN as Oncology Nurse Navigator    Name / MRN / DOB: Maria Carter  [id number]  Apr 11, 1971   Date of visit: 05/01/2023   Chief Complaint/Reason for visit: rash   Current Therapy: Keytruda, Adriamycin, Cytoxan   Last treatment:  Day 1   Cycle 8 on 04/06/23   ASSESSMENT & PLAN: Patient is a 52 y.o. female with oncologic history of Malignant neoplasm of upper-outer quadrant of right female breast followed by Dr. Al Pimple.  I have viewed most recent oncology note and lab work.    #Malignant neoplasm of upper-outer quadrant of right female breast - Next appointment with oncologist is 05/19/23  #Rash -Not related to chemotherapy. No signs of secondary bacterial infection.  - Rash presented after spending time outdoors with dogs in a wooded field, could be from environmental exposure.  Prescription for Kenalog sent to pharmacy. Encouraged to take zyrtec for pruritus.  - If rash does not improve recommended PCP or dermatology follow up. Patient agreeable with plan.  Strict ED precautions discussed should symptoms worsen.    Heme/Onc History: Oncology History  Malignant neoplasm of upper-outer quadrant of right female breast (HCC)  09/26/2022 Mammogram   Patient had a palpable right breast mass and hence underwent bilateral diagnostic mammogram.  This confirmed a suspicious 3 cm right upper outer quadrant mass and an abnormal right axillary lymph node with cortical thickening.  Tissue sampling of both the breast mass and axillary lymph node were recommended.  Left breast was normal.    09/26/2022 Breast US   Ultrasound breast confirmed the above-mentioned findings.   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  Prognostic showed ER 50% positive weak staining PR 0% negative HER2 1+ by IHC and Ki-67 of 70%   10/06/2022 Initial Diagnosis   Malignant neoplasm of upper-outer quadrant of right female breast (HCC)   10/08/2022 Cancer Staging   Staging form: Breast, AJCC 8th Edition - Clinical: Stage IIB (cT2, cN0, cM0, G3, ER+, PR-, HER2-) - Signed by [name], MD on 10/08/2022 Stage prefix: Initial diagnosis Histologic grading system: 3 grade system   10/24/2022 - 11/14/2022 Chemotherapy   Patient is on Treatment Plan : BREAST Pembrolizumab (200) D1 + Carboplatin (5) D1 + Paclitaxel (80) D1,8,15 q21d X 4 cycles / Pembrolizumab (200) D1 + AC D1 q21d x 4 cycles      Genetic Testing   [employer] Common Cancer Panel+RNA was Negative. Report date is 10/16/2022.  The Common Hereditary Cancers Panel offered by [employer] includes sequencing and/or deletion duplication testing of the following 48 genes: APC, ATM, AXIN2, BAP1, BARD1, BMPR1A, BRCA1, BRCA2, BRIP1, CDH1, CDK4, CDKN2A (p14ARF and p16INK4a only), CHEK2, CTNNA1, DICER1, EPCAM (Deletion/duplication testing only), FH, GREM1 (promoter region duplication testing only), HOXB13, KIT, MBD4, MEN1, MLH1, MSH2, MSH3, MSH6, MUTYH, NF1, NHTL1, PALB2, PDGFRA, PMS2, POLD1, POLE, PTEN, RAD51C, RAD51D, SDHA (sequencing analysis only except exon 14), SDHB, SDHC, SDHD, SMAD4, SMARCA4. STK11, TP53, TSC1, TSC2, and VHL.   11/21/2022 - 04/08/2023 Chemotherapy   Patient is on Treatment Plan : BREAST Pembrolizumab (200) D1 + Carboplatin (1.5) D1,8,15 + Paclitaxel (80)  D1,8,15 q21d X 4 cycles / Pembrolizumab (200) D1 + AC D1 q21d x 4 cycles     04/28/2023 -  Chemotherapy   Patient is on Treatment Plan : BREAST Pembrolizumab (200) q21d x 27 weeks         Interval history-: Maria Carter is  a 52 y.o. female with oncologic history as above presenting to [facility] today with chief complaint of rash.  She presents unaccompanied to clinic.  Patient states the rash has been present x 1 week.  She states it started on her right forearm and then spread to her torso and left arm.  2 weeks ago her boyfriend had a rash from chiggers however this looked from the rash she currently has.  She has applied Benadryl cream and anti-itch lotion which seems to be helping.  She states the rash seems dried out more today.  She has not had any drainage or open wounds.  She denies history of similar rash.  She states it started after spending time running her dogs in wooded fields.  She denies any new medications.  Denies any difficulty breathing, wheezing, nausea or vomiting.      ROS  All other systems are reviewed and are negative for acute change except as noted in the HPI.    No Known Allergies   Past Medical History:  Diagnosis Date   Anxiety    Breast cancer (HCC)    Depression    Hx of febrile seizure    1980     Past Surgical History:  Procedure Laterality Date   BREAST BIOPSY Right 09/30/2022   [place] RT BREAST BX W LOC DEV 1ST LESION IMG BX SPEC US GUIDE 09/30/2022 [facility] MAMMOGRAP[facility]   PORTACATH PLACEMENT N/A 10/23/2022   Procedure: INSERTION PORT-A-CATH;  Surgeon: [name], MD;  Location: [facility];  Service: General;  Laterality: N/A;    Social History   Socioeconomic History   Marital status: Single    Spouse name: Not on file   Number of children: Not on file   Years of education: Not on file   Highest education level: Not on file  Occupational History   Not on file  Tobacco Use   Smoking status: Never   Smokeless tobacco: Never  Vaping Use   Vaping status: Never Used  Substance and Sexual Activity   Alcohol use: Yes    Comment: social   Drug use: No   Sexual activity: Not Currently    Partners: Male  Other Topics Concern   Not on file   Social History Narrative   Not on file   Social Determinants of Health   Financial Resource Strain: High Risk (10/08/2022)   Overall Financial Resource Strain (CARDIA)    Difficulty of Paying Living Expenses: Hard  Food Insecurity: Food Insecurity Present (10/08/2022)   Hunger Vital Sign    Worried About Running Out of Food in the Last Year: Sometimes true    Ran Out of Food in the Last Year: Sometimes true  Transportation Needs: No Transportation Needs (10/08/2022)   PRAPARE - Administrator, Civil Service (Medical): No    Lack of Transportation (Non-Medical): No  Physical Activity: Not on file  Stress: Not on file  Social Connections: Not on file  Intimate Partner Violence: Not on file    Family History  Problem Relation Age of Onset   Breast cancer Sister 42 - 16   Cancer Paternal Grandmother  unknown type     Current Outpatient Medications:    triamcinolone (KENALOG) 0.025 % ointment, Apply 1 Application topically 2 (two) times daily. Use for 7 days. Do not apply to face., Disp: 80 g, Rfl: 0   B Complex Vitamins (B COMPLEX PO), Take by mouth., Disp: , Rfl:    busPIRone (BUSPAR) 15 MG tablet, Take by mouth., Disp: , Rfl:    cholecalciferol (VITAMIN D3) 25 MCG (1000 UNIT) tablet, Take 1,000 Units by mouth daily., Disp: , Rfl:    desvenlafaxine (PRISTIQ) 100 MG 24 hr tablet, Take 100 mg by mouth daily., Disp: , Rfl:    dexamethasone (DECADRON) 4 MG tablet, Take 1 tab PO BID with food x2 days after treatment, Disp: 30 tablet, Rfl: 0   gabapentin (NEURONTIN) 300 MG capsule, Take 1 capsule (300 mg total) by mouth at bedtime., Disp: 30 capsule, Rfl: 0   levothyroxine (SYNTHROID) 25 MCG tablet, Take 2 tablets (50 mcg total) by mouth daily before breakfast. Dose being titrated, Disp: 60 tablet, Rfl: 3   Multiple Vitamins-Minerals (MULTIVITAMIN ADULTS PO), Take 1 tablet by mouth daily., Disp: , Rfl:    prochlorperazine (COMPAZINE) 10 MG tablet, Take 1 tablet (10 mg  total) by mouth every 6 (six) hours as needed for nausea or vomiting., Disp: 30 tablet, Rfl: 0   traMADol (ULTRAM) 50 MG tablet, Take 1 tablet (50 mg total) by mouth every 6 (six) hours as needed for moderate pain or severe pain., Disp: 25 tablet, Rfl: 0   vitamin B-12 (CYANOCOBALAMIN) 100 MCG tablet, Take 100 mcg by mouth daily., Disp: , Rfl:   PHYSICAL EXAM: ECOG FS:1 - Symptomatic but completely ambulatory    Vitals:   05/01/23 1147  BP: 112/82  Pulse: 75  Resp: 16  Temp: 98.4 F (36.9 C)  TempSrc: Oral  SpO2: 99%  Weight: 157 lb 4.8 oz (71.4 kg)   Physical Exam Vitals and nursing note reviewed.  Constitutional:      Appearance: She is not ill-appearing or toxic-appearing.  HENT:     Head: Normocephalic.  Eyes:     Conjunctiva/sclera: Conjunctivae normal.  Cardiovascular:     Rate and Rhythm: Normal rate.  Pulmonary:     Effort: Pulmonary effort is normal.  Abdominal:     General: There is no distension.  Musculoskeletal:     Cervical back: Normal range of motion.  Skin:    General: Skin is warm and dry.     Findings: Rash present. Rash is macular.     Comments: Rash on bilateral forearms and stomach. Mild excoriation. No rash on palms or soles.  Neurological:     Mental Status: She is alert.        LABORATORY DATA: I have reviewed the data as listed    Latest Ref Rng & Units 04/06/2023   12:08 PM 03/16/2023   12:29 PM 02/23/2023   11:11 AM  CBC  WBC 4.0 - 10.5 K/uL 3.6  5.7  5.7   Hemoglobin 12.0 - 15.0 g/dL 52.8  9.2  9.6   Hematocrit 36.0 - 46.0 % 30.5  28.0  29.5   Platelets 150 - 400 K/uL 166  225  278         Latest Ref Rng & Units 04/06/2023   12:08 PM 03/16/2023   12:29 PM 02/23/2023   11:11 AM  CMP  Glucose 70 - 99 mg/dL 94  92  413   BUN 6 - 20 mg/dL 18  13  14  Creatinine 0.44 - 1.00 mg/dL 4.09  8.11  9.14   Sodium 135 - 145 mmol/L 138  138  140   Potassium 3.5 - 5.1 mmol/L 3.9  3.6  3.6   Chloride 98 - 111 mmol/L 105  103  103   CO2 22  - 32 mmol/L 28  31  32   Calcium 8.9 - 10.3 mg/dL 9.1  9.3  9.5   Total Protein 6.5 - 8.1 g/dL 7.4  7.2  7.0   Total Bilirubin 0.3 - 1.2 mg/dL 0.2  0.2  0.2   Alkaline Phos 38 - 126 U/L 99  102  84   AST 15 - 41 U/L 17  18  23    ALT 0 - 44 U/L 10  13  15         RADIOGRAPHIC STUDIES (from last 24 hours if applicable) I have personally reviewed the radiological images as listed and agreed with the findings in the report. No results found.      Visit Diagnosis: 1. Malignant neoplasm of upper-outer quadrant of right breast in female, estrogen receptor positive (HCC)   2. Rash      No orders of the defined types were placed in this encounter.   All questions were answered. The patient knows to call the clinic with any problems, questions or concerns. No barriers to learning was detected.  A total of more than 30 minutes were spent on this encounter with face-to-face time and non-face-to-face time, including preparing to see the patient, ordering medications, counseling the patient and coordination of care as outlined above.    Thank you for allowing me to participate in the care of this patient.    Shanon Ace, PA-C Department of Hematology/Oncology [facility] Phone: [phone]  [phone]    05/01/2023 4:41 PM

## 2023-05-01 ENCOUNTER — Inpatient Hospital Stay: Payer: Medicare HMO | Admitting: Physician Assistant

## 2023-05-01 VITALS — BP 112/82 | HR 75 | Temp 98.4°F | Resp 16 | Wt 157.3 lb

## 2023-05-01 DIAGNOSIS — R21 Rash and other nonspecific skin eruption: Secondary | ICD-10-CM

## 2023-05-01 DIAGNOSIS — C50411 Malignant neoplasm of upper-outer quadrant of right female breast: Secondary | ICD-10-CM

## 2023-05-01 DIAGNOSIS — Z170 Estrogen receptor positive status [ER+]: Secondary | ICD-10-CM | POA: Diagnosis not present

## 2023-05-01 DIAGNOSIS — Z79899 Other long term (current) drug therapy: Secondary | ICD-10-CM | POA: Diagnosis not present

## 2023-05-01 DIAGNOSIS — Z5111 Encounter for antineoplastic chemotherapy: Secondary | ICD-10-CM | POA: Diagnosis not present

## 2023-05-01 DIAGNOSIS — Z803 Family history of malignant neoplasm of breast: Secondary | ICD-10-CM | POA: Diagnosis not present

## 2023-05-01 MED ORDER — TRIAMCINOLONE ACETONIDE 0.025 % EX OINT: 1.0000 | TOPICAL_OINTMENT | Freq: Two times a day (BID) | CUTANEOUS | 0 refills | Status: DC

## 2023-05-03 ENCOUNTER — Other Ambulatory Visit: Payer: Self-pay

## 2023-05-06 ENCOUNTER — Other Ambulatory Visit: Payer: Medicare HMO

## 2023-05-12 ENCOUNTER — Other Ambulatory Visit: Payer: Self-pay | Admitting: Hematology and Oncology

## 2023-05-12 ENCOUNTER — Encounter: Payer: Self-pay | Admitting: Hematology and Oncology

## 2023-05-13 ENCOUNTER — Other Ambulatory Visit: Payer: Self-pay

## 2023-05-13 ENCOUNTER — Encounter (HOSPITAL_BASED_OUTPATIENT_CLINIC_OR_DEPARTMENT_OTHER): Payer: Self-pay | Admitting: Surgery

## 2023-05-18 ENCOUNTER — Other Ambulatory Visit: Payer: Medicare HMO

## 2023-05-19 ENCOUNTER — Encounter: Payer: Self-pay | Admitting: Hematology and Oncology

## 2023-05-19 ENCOUNTER — Inpatient Hospital Stay: Payer: Medicare HMO

## 2023-05-19 ENCOUNTER — Inpatient Hospital Stay (HOSPITAL_BASED_OUTPATIENT_CLINIC_OR_DEPARTMENT_OTHER): Payer: Medicare HMO | Admitting: Adult Health

## 2023-05-19 ENCOUNTER — Inpatient Hospital Stay: Payer: Medicare HMO | Attending: Physician Assistant

## 2023-05-19 ENCOUNTER — Encounter: Payer: Self-pay | Admitting: Adult Health

## 2023-05-19 DIAGNOSIS — C50411 Malignant neoplasm of upper-outer quadrant of right female breast: Secondary | ICD-10-CM

## 2023-05-19 DIAGNOSIS — Z79899 Other long term (current) drug therapy: Secondary | ICD-10-CM | POA: Diagnosis not present

## 2023-05-19 DIAGNOSIS — Z170 Estrogen receptor positive status [ER+]: Secondary | ICD-10-CM | POA: Insufficient documentation

## 2023-05-19 DIAGNOSIS — Z5111 Encounter for antineoplastic chemotherapy: Secondary | ICD-10-CM | POA: Diagnosis not present

## 2023-05-19 DIAGNOSIS — Z95828 Presence of other vascular implants and grafts: Secondary | ICD-10-CM

## 2023-05-19 LAB — CMP (CANCER CENTER ONLY)
ALT: 10 U/L (ref 0–44)
AST: 17 U/L (ref 15–41)
Albumin: 4.1 g/dL (ref 3.5–5.0)
Alkaline Phosphatase: 71 U/L (ref 38–126)
Anion gap: 4 — ABNORMAL LOW (ref 5–15)
BUN: 14 mg/dL (ref 6–20)
CO2: 30 mmol/L (ref 22–32)
Calcium: 9.6 mg/dL (ref 8.9–10.3)
Chloride: 104 mmol/L (ref 98–111)
Creatinine: 0.73 mg/dL (ref 0.44–1.00)
GFR, Estimated: >60 mL/min (ref >60)
Glucose, Bld: 116 mg/dL — ABNORMAL HIGH (ref 70–99)
Potassium: 3.7 mmol/L (ref 3.5–5.1)
Sodium: 138 mmol/L (ref 135–145)
Total Bilirubin: 0.6 mg/dL (ref 0.3–1.2)
Total Protein: 7.3 g/dL (ref 6.5–8.1)

## 2023-05-19 LAB — CBC WITH DIFFERENTIAL (CANCER CENTER ONLY)
Abs Immature Granulocytes: 0 10*3/uL (ref 0.00–0.07)
Basophils Absolute: 0 10*3/uL (ref 0.0–0.1)
Basophils Relative: 1 %
Eosinophils Absolute: 0.4 10*3/uL (ref 0.0–0.5)
Eosinophils Relative: 16 %
HCT: 31.9 % — ABNORMAL LOW (ref 36.0–46.0)
Hemoglobin: 10.6 g/dL — ABNORMAL LOW (ref 12.0–15.0)
Immature Granulocytes: 0 %
Lymphocytes Relative: 18 %
Lymphs Abs: 0.5 10*3/uL — ABNORMAL LOW (ref 0.7–4.0)
MCH: 31.5 pg (ref 26.0–34.0)
MCHC: 33.2 g/dL (ref 30.0–36.0)
MCV: 94.9 fL (ref 80.0–100.0)
Monocytes Absolute: 0.3 10*3/uL (ref 0.1–1.0)
Monocytes Relative: 11 %
Neutro Abs: 1.4 10*3/uL — ABNORMAL LOW (ref 1.7–7.7)
Neutrophils Relative %: 54 %
Platelet Count: 142 10*3/uL — ABNORMAL LOW (ref 150–400)
RBC: 3.36 MIL/uL — ABNORMAL LOW (ref 3.87–5.11)
RDW: 11.9 % (ref 11.5–15.5)
WBC Count: 2.6 10*3/uL — ABNORMAL LOW (ref 4.0–10.5)
nRBC: 0 % (ref 0.0–0.2)

## 2023-05-19 MED ORDER — SODIUM CHLORIDE 0.9 % IV SOLN: Freq: Once | INTRAVENOUS | Status: AC

## 2023-05-19 MED ORDER — SODIUM CHLORIDE 0.9% FLUSH
10.0000 mL | INTRAVENOUS | Status: DC | PRN
  Administered 2023-05-19: 10 mL

## 2023-05-19 MED ORDER — SODIUM CHLORIDE 0.9% FLUSH
10.0000 mL | Freq: Once | INTRAVENOUS | Status: AC
  Administered 2023-05-19: 10 mL

## 2023-05-19 MED ORDER — SODIUM CHLORIDE 0.9 % IV SOLN
200.0000 mg | Freq: Once | INTRAVENOUS | Status: AC
  Administered 2023-05-19: 200 mg via INTRAVENOUS
  Filled 2023-05-19: qty 8

## 2023-05-19 MED ORDER — HEPARIN SOD (PORK) LOCK FLUSH 100 UNIT/ML IV SOLN
500.0000 [IU] | Freq: Once | INTRAVENOUS | Status: AC | PRN
  Administered 2023-05-19: 500 [IU]

## 2023-05-19 NOTE — Assessment & Plan Note (Signed)
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  Current treatment: Keytruda  Fatigue: Recommended energy conservation.  I recertified her request for temporary handicap placard for 6 months time. Rash: resolved after zyrtec and kenalog At risk for heart failure: Echo pre-chemo was normal Mild cytopenias: Her white blood cell count is 2.6 and her ANC is 1.4.  Her platelet count is 142.  I forwarded these lab results to Dr. Magnus Ivan and our nurse navigators since [place] scheduled for surgery on September 19.  Maria Carter will return every 3 weeks for labs, follow-up, and her Keytruda.  She and I reviewed her MRI result images in detail.  She will proceed with treatment today with no dose reductions.

## 2023-05-19 NOTE — Patient Instructions (Signed)
Maria Carter  Discharge Instructions: Thank you for choosing [facility] to provide your oncology and hematology care.   If you have a lab appointment with the [facility], please go directly to the [facility] and check in at the registration area.   Wear comfortable clothing and clothing appropriate for easy access to any Portacath or PICC line.   We strive to give you quality time with your provider. You may need to reschedule your appointment if you arrive late (15 or more minutes).  Arriving late affects you and other patients whose appointments are after yours.  Also, if you miss three or more appointments without notifying the office, you may be dismissed from the clinic at the provider's discretion.      For prescription refill requests, have your pharmacy contact our office and allow 72 hours for refills to be completed.    Today you received the following chemotherapy and/or immunotherapy agents: Keytruda      To help prevent nausea and vomiting after your treatment, we encourage you to take your nausea medication as directed.  BELOW ARE SYMPTOMS THAT SHOULD BE REPORTED IMMEDIATELY: *FEVER GREATER THAN 100.4 F (38 C) OR HIGHER *CHILLS OR SWEATING *NAUSEA AND VOMITING THAT IS NOT CONTROLLED WITH YOUR NAUSEA MEDICATION *UNUSUAL SHORTNESS OF BREATH *UNUSUAL BRUISING OR BLEEDING *URINARY PROBLEMS (pain or burning when urinating, or frequent urination) *BOWEL PROBLEMS (unusual diarrhea, constipation, pain near the anus) TENDERNESS IN MOUTH AND THROAT WITH OR WITHOUT PRESENCE OF ULCERS (sore throat, sores in mouth, or a toothache) UNUSUAL RASH, SWELLING OR PAIN  UNUSUAL VAGINAL DISCHARGE OR ITCHING   Items with * indicate a potential emergency and should be followed up as soon as possible or go to the Emergency Department if any problems should occur.  Please show the CHEMOTHERAPY ALERT CARD or IMMUNOTHERAPY ALERT CARD at  check-in to the Emergency Department and triage nurse.  Should you have questions after your visit or need to cancel or reschedule your appointment, please contact [facility]  Dept: [phone]  and follow the prompts.  Office hours are 8:00 a.m. to 4:30 p.m. Monday - Friday. Please note that voicemails left after 4:00 p.m. may not be returned until the following business day.  We are closed weekends and major holidays. You have access to a nurse at all times for urgent questions. Please call the main number to the clinic Dept: [phone] and follow the prompts.   For any non-urgent questions, you may also contact your provider using MyChart. We now offer e-Visits for anyone 14 and older to request care online for non-urgent symptoms. For details visit [email or web address].   Also download the MyChart app! Go to the app store, search "MyChart", open the app, select [facility], and log in with your MyChart username and password.

## 2023-05-19 NOTE — Progress Notes (Signed)
[facility] Cancer Follow up:    Maria Shackleton, NP-C [address] [place] [address]   DIAGNOSIS:  Cancer Staging  Malignant neoplasm of upper-outer quadrant of right female breast [facility]) Staging form: Breast, AJCC 8th Edition - Clinical: Stage IIB (cT2, cN0, cM0, G3, ER+, PR-, HER2-) - Signed by [name], MD on 10/08/2022 Stage prefix: Initial diagnosis Histologic grading system: 3 grade system   SUMMARY OF ONCOLOGIC HISTORY: Oncology History  Malignant neoplasm of upper-outer quadrant of right female breast (HCC)  09/26/2022 Mammogram   Patient had a palpable right breast mass and hence underwent bilateral diagnostic mammogram.  This confirmed a suspicious 3 cm right upper outer quadrant mass and an abnormal right axillary lymph node with cortical thickening.  Tissue sampling of both the breast mass and axillary lymph node were recommended.  Left breast was normal.   09/26/2022 Breast US   Ultrasound breast confirmed the above-mentioned findings.   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  Prognostic showed ER 50% positive weak staining PR 0% negative HER2 1+ by IHC and Ki-67 of 70%   10/06/2022 Initial Diagnosis   Malignant neoplasm of upper-outer quadrant of right female breast (HCC)   10/08/2022 Cancer Staging   Staging form: Breast, AJCC 8th Edition - Clinical: Stage IIB (cT2, cN0, cM0, G3, ER+, PR-, HER2-) - Signed by [name], MD on 10/08/2022 Stage prefix: Initial diagnosis Histologic grading system: 3 grade system   10/24/2022 - 11/14/2022 Chemotherapy   Patient is on Treatment Plan : BREAST Pembrolizumab (200) D1 + Carboplatin (5) D1 + Paclitaxel (80) D1,8,15 q21d X 4 cycles / Pembrolizumab (200) D1 + AC D1 q21d x 4 cycles      Genetic Testing   [facility] Common Cancer Panel+RNA was Negative. Report date is 10/16/2022.  The Common Hereditary Cancers Panel offered by [facility]  includes sequencing and/or deletion duplication testing of the following 48 genes: APC, ATM, AXIN2, BAP1, BARD1, BMPR1A, BRCA1, BRCA2, BRIP1, CDH1, CDK4, CDKN2A (p14ARF and p16INK4a only), CHEK2, CTNNA1, DICER1, EPCAM (Deletion/duplication testing only), FH, GREM1 (promoter region duplication testing only), HOXB13, KIT, MBD4, MEN1, MLH1, MSH2, MSH3, MSH6, MUTYH, NF1, NHTL1, PALB2, PDGFRA, PMS2, POLD1, POLE, PTEN, RAD51C, RAD51D, SDHA (sequencing analysis only except exon 14), SDHB, SDHC, SDHD, SMAD4, SMARCA4. STK11, TP53, TSC1, TSC2, and VHL.   11/21/2022 - 04/08/2023 Chemotherapy   Patient is on Treatment Plan : BREAST Pembrolizumab (200) D1 + Carboplatin (1.5) D1,8,15 + Paclitaxel (80) D1,8,15 q21d X 4 cycles / Pembrolizumab (200) D1 + AC D1 q21d x 4 cycles     05/19/2023 -  Chemotherapy   Patient is on Treatment Plan : BREAST Pembrolizumab (200) q21d x 27 weeks       CURRENT THERAPY:  INTERVAL HISTORY: Maria Carter 52 y.o. female returns for follow-up of her breast cancer prior to receiving treatment with pembrolizumab.    She underwent breast MRI on April 13, 2023 that demonstrated a positive chemotherapy response with a significant decrease in size of her biopsy-proven malignancy in the upper outer quadrant of the right breast that now measured 0.8 x 1.4 cm (previously 3.5 x 3.2 cm).  Her rash that she came in with on 05/01/2023 has resolved.  She has been doing well otherwise.  She is scheduled for lumpectomy on Thursday, 05/21/2023.   Patient Active Problem List   Diagnosis Date Noted   Port-A-Cath in place 10/24/2022   Genetic testing 10/16/2022  Malignant neoplasm of upper-outer quadrant of right female breast (HCC) 10/06/2022   Thrombocytopenia (HCC) 03/05/2022   Leukopenia 03/05/2022   Hot flashes 02/28/2022   Hair thinning 02/28/2022   Amenorrhea 02/28/2022   Screening for colon cancer 02/28/2022   Multiple nevi 02/28/2022   PTSD (post-traumatic stress disorder) 02/08/2021    Mild episode of recurrent major depressive disorder (HCC) 09/12/2013   Attention deficit disorder 04/25/2013   Migraine 02/05/2013   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  MEDICAL HISTORY: Past Medical History:  Diagnosis Date   Anxiety    Breast cancer (HCC)    right breast IDC   Depression    Hx of febrile seizure    1980   Hypothyroidism     SURGICAL HISTORY: Past Surgical History:  Procedure Laterality Date   BREAST BIOPSY Right 09/30/2022   [place] RT BREAST BX W LOC DEV 1ST LESION IMG BX SPEC US GUIDE 09/30/2022 [facility] MAMMOGRAPHY   PORTACATH PLACEMENT N/A 10/23/2022   Procedure: INSERTION PORT-A-CATH;  Surgeon: [name], MD;  Location: [facility];  Service: General;  Laterality: N/A;    SOCIAL HISTORY: Social History   Socioeconomic History   Marital status: Single    Spouse name: Not on file   Number of children: Not on file   Years of education: Not on file   Highest education level: Not on file  Occupational History   Not on file  Tobacco Use   Smoking status: Never   Smokeless tobacco: Never  Vaping Use   Vaping status: Never Used  Substance and Sexual Activity   Alcohol use: Not Currently    Comment: social   Drug use: No   Sexual activity: Yes    Partners: Male    Birth control/protection: None  Other Topics Concern   Not on file  Social History Narrative   Not on file   Social Determinants of Health   Financial Resource Strain: High Risk (10/08/2022)   Overall Financial Resource Strain (CARDIA)    Difficulty of Paying Living Expenses: Hard  Food Insecurity: Food Insecurity Present (10/08/2022)   Hunger Vital Sign    Worried About Running Out of Food in the Last Year: Sometimes true    Ran Out of Food in the Last Year: Sometimes true  Transportation Needs: No  Transportation Needs (10/08/2022)   PRAPARE - Administrator, Civil Service (Medical): No    Lack of Transportation (Non-Medical): No  Physical Activity: Not on file  Stress: Not on file  Social Connections: Not on file  Intimate Partner Violence: Not on file    FAMILY HISTORY: Family History  Problem Relation Age of Onset   Breast cancer Sister 62 - 28   Cancer Paternal Grandmother        unknown type    Review of Systems  Constitutional:  Positive for fatigue. Negative for appetite change, chills, fever and unexpected weight change.  HENT:   Negative for hearing loss, lump/mass and trouble swallowing.   Eyes:  Negative for eye problems and icterus.  Respiratory:  Negative for chest tightness, cough and shortness of breath.   Cardiovascular:  Negative for chest pain, leg swelling and palpitations.  Gastrointestinal:  Negative for abdominal distention, abdominal pain, constipation, diarrhea, nausea and vomiting.  Endocrine: Negative for hot flashes.  Genitourinary:  Negative  for difficulty urinating.   Musculoskeletal:  Negative for arthralgias.  Skin:  Negative for itching and rash.  Neurological:  Negative for dizziness, extremity weakness, headaches and numbness.  Hematological:  Negative for adenopathy. Does not bruise/bleed easily.  Psychiatric/Behavioral:  Negative for depression. The patient is not nervous/anxious.       PHYSICAL EXAMINATION   Onc Performance Status - 05/19/23 1029       KPS SCALE   KPS % SCORE Able to carry on normal activity, minor s/s of disease             Vitals:   05/19/23 1021  BP: 114/61  Pulse: 62  Resp: 18  Temp: (!) 97.3 F (36.3 C)  SpO2: 96%    Physical Exam Constitutional:      General: She is not in acute distress.    Appearance: Normal appearance. She is not toxic-appearing.  HENT:     Head: Normocephalic and atraumatic.     Mouth/Throat:     Mouth: Mucous membranes are moist.     Pharynx: Oropharynx  is clear. No oropharyngeal exudate or posterior oropharyngeal erythema.  Eyes:     General: No scleral icterus. Cardiovascular:     Rate and Rhythm: Normal rate and regular rhythm.     Pulses: Normal pulses.     Heart sounds: Normal heart sounds.  Pulmonary:     Effort: Pulmonary effort is normal.     Breath sounds: Normal breath sounds.  Abdominal:     General: Abdomen is flat. Bowel sounds are normal. There is no distension.     Palpations: Abdomen is soft.     Tenderness: There is no abdominal tenderness.  Musculoskeletal:        General: No swelling.     Cervical back: Neck supple.  Lymphadenopathy:     Cervical: No cervical adenopathy.  Skin:    General: Skin is warm and dry.     Findings: No rash.  Neurological:     General: No focal deficit present.     Mental Status: She is alert.  Psychiatric:        Mood and Affect: Mood normal.        Behavior: Behavior normal.     LABORATORY DATA:  CBC    Component Value Date/Time   WBC 2.6 (L) 05/19/2023 0948   WBC 2.9 (L) 02/28/2022 1049   RBC 3.36 (L) 05/19/2023 0948   HGB 10.6 (L) 05/19/2023 0948   HCT 31.9 (L) 05/19/2023 0948   PLT 142 (L) 05/19/2023 0948   MCV 94.9 05/19/2023 0948   MCH 31.5 05/19/2023 0948   MCHC 33.2 05/19/2023 0948   RDW 11.9 05/19/2023 0948   LYMPHSABS 0.5 (L) 05/19/2023 0948   MONOABS 0.3 05/19/2023 0948   EOSABS 0.4 05/19/2023 0948   BASOSABS 0.0 05/19/2023 0948    CMP     Component Value Date/Time   NA 138 05/19/2023 0948   K 3.7 05/19/2023 0948   CL 104 05/19/2023 0948   CO2 30 05/19/2023 0948   GLUCOSE 116 (H) 05/19/2023 0948   BUN 14 05/19/2023 0948   CREATININE 0.73 05/19/2023 0948   CALCIUM 9.6 05/19/2023 0948   PROT 7.3 05/19/2023 0948   ALBUMIN 4.1 05/19/2023 0948   AST 17 05/19/2023 0948   ALT 10 05/19/2023 0948   ALKPHOS 71 05/19/2023 0948   BILITOT 0.6 05/19/2023 0948   GFRNONAA >60 05/19/2023 0948   GFRAA >60 12/23/2015 1151  ASSESSMENT and  THERAPY PLAN:   Malignant neoplasm of upper-outer quadrant of right female breast (HCC) Maria Carter is a 52 year old woman with 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  Current treatment: Keytruda  Fatigue: Recommended energy conservation.  I recertified her request for temporary handicap placard for 6 months time. Rash: resolved after zyrtec and kenalog At risk for heart failure: Echo pre-chemo was normal Mild cytopenias: Her white blood cell count is 2.6 and her ANC is 1.4.  Her platelet count is 142.  I forwarded these lab results to Dr. Magnus Ivan and our nurse navigators since [place] scheduled for surgery on September 19.  Ertha will return every 3 weeks for labs, follow-up, and her Keytruda.  She and I reviewed her MRI result images in detail.  She will proceed with treatment today with no dose reductions.    All questions were answered. The patient knows to call the clinic with any problems, questions or concerns. We can certainly see the patient much sooner if necessary.  Total encounter time:20 minutes*in face-to-face visit time, chart review, lab review, care coordination, order entry, and documentation of the encounter time.    Lillard Anes, NP 05/19/23 11:16 AM Medical Oncology and Hematology [facility] [address] [place], [place] [address] Tel. [phone]    Fax. [phone]  *Total Encounter Time as defined by the Centers for Medicare and Medicaid Services includes, in addition to the face-to-face time of a patient visit (documented in the note above) non-face-to-face time: obtaining and reviewing outside history, ordering and reviewing medications, tests or procedures, care coordination (communications with other health care professionals or caregivers) and documentation in the medical record.

## 2023-05-20 ENCOUNTER — Ambulatory Visit
Admission: RE | Admit: 2023-05-20 | Discharge: 2023-05-20 | Disposition: A | Discharge status: Home / Self Care | Payer: Medicare HMO | Source: Ambulatory Visit | Attending: Surgery | Admitting: Surgery

## 2023-05-20 DIAGNOSIS — C50411 Malignant neoplasm of upper-outer quadrant of right female breast: Secondary | ICD-10-CM | POA: Diagnosis not present

## 2023-05-20 DIAGNOSIS — Z853 Personal history of malignant neoplasm of breast: Secondary | ICD-10-CM

## 2023-05-20 HISTORY — PX: BREAST BIOPSY: SHX20

## 2023-05-20 MED ORDER — CHLORHEXIDINE GLUCONATE CLOTH 2 % EX PADS: 6.0000 | MEDICATED_PAD | Freq: Once | CUTANEOUS | Status: DC

## 2023-05-20 MED ORDER — ENSURE PRE-SURGERY PO LIQD: 296.0000 mL | Freq: Once | ORAL | Status: DC

## 2023-05-20 NOTE — H&P (Signed)
PROVIDER: Wayne Both, MD  MRN: [id number] DOB: [birth year] DATE OF ENCOUNTER: 04/17/2023 Subjective   Chief Complaint: RECHECK (RT BREAST)   History of Present Illness: Maria Carter is a 52 y.o. female who is seen  for a follow-up regarding her right breast cancer. She has had a follow-up MRI after neoadjuvant chemotherapy. This shows that the right breast cancer has decreased from 3.5 x 3.2 cm to 1.4 x 0.8 cm. There are no enlarged lymph nodes in the left breast is normal. She has been doing well and tolerating treatment.    Review of Systems: A complete review of systems was obtained from the patient. I have reviewed this information and discussed as appropriate with the patient. See HPI as well for other ROS.  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 (CMS/HHS-HCC)  Migraine  Multiple nevi  Thrombocytopenia (CMS-HCC)  Amenorrhea   Past Surgical History:  Procedure Laterality Date  .breast biopsy Right 09/30/2022    No Known Allergies  Current Outpatient Medications on File Prior to Visit  Medication Sig Dispense Refill  cholecalciferol (VITAMIN D3) 1000 unit tablet Take 1,000 Units by mouth once daily  cyanocobalamin (VITAMIN B12) 100 MCG tablet Take 100 mcg by mouth once daily  desvenlafaxine succinate (PRISTIQ) 100 MG ER tablet Take 100 mg by mouth once daily  levothyroxine (SYNTHROID) 25 MCG tablet Take by mouth   No current facility-administered medications on file prior to visit.   Family History  Problem Relation Age of Onset  Breast cancer Sister    Social History   Tobacco Use  Smoking Status Never  Smokeless Tobacco Never    Social History    Socioeconomic History  Marital status: Single  Tobacco Use  Smoking status: Never  Smokeless tobacco: Never  Vaping Use  Vaping status: Never Used  Substance and Sexual Activity  Alcohol use: Defer  Drug use: Defer   Social Determinants of Health   Financial Resource Strain: High Risk (10/08/2022)  Received [facility], [place]  Overall Financial Resource Strain (CARDIA)  Difficulty of Paying Living Expenses: Hard  Food Insecurity: Food Insecurity Present (10/08/2022)  Received from [facility], [place]  Hunger Vital Sign  Worried About Running Out of Food in the Last Year: Sometimes true  Ran Out of Food in the Last Year: Sometimes true  Transportation Needs: No Transportation Needs (10/08/2022)  Received from [facility], [place]  PRAPARE - Transportation  Lack of Transportation (Medical): No  Lack of Transportation (Non-Medical): No   Objective:   Vitals:  04/17/23 1140  PainSc: 0-No pain   There is no height or weight on file to calculate BMI.  Physical Exam   She appears well on exam  There are no palpable breast masses in the right breast and no axillary adenopathy.  She has a large, tender keloid on her central chest over the sternum  Labs, Imaging and Diagnostic Testing:  I reviewed the MRI.  Assessment and Plan:   Diagnoses and all orders for this visit:  Invasive ductal carcinoma of right breast (CMS/HHS-HCC)    We have discussed her in her multidisciplinary breast cancer conference. She has had a good result with neoadjuvant chemotherapy with significant  reduction in size of the breast cancer on the right side. She is now a candidate for breast conservation. We discussed proceeding with a right breast radioactive seed guided lumpectomy and sentinel lymph node biopsy. I explained the surgical procedure in detail. We discussed the risk which includes but is not limited to bleeding, infection, injury to surrounding structures, the need  for further surgery if margins or nodes are positive, cardiopulmonary issues with anesthesia, postoperative swelling, DVT, etc. We would also plan on excising the keloid on her central chest because of the increase in size and tenderness especially in the future need for radiation therapy as well. She and her family understand agree with the plans. Surgery will be scheduled

## 2023-05-20 NOTE — Progress Notes (Signed)

## 2023-05-21 ENCOUNTER — Encounter (HOSPITAL_BASED_OUTPATIENT_CLINIC_OR_DEPARTMENT_OTHER)
Admission: RE | Disposition: A | Discharge status: Home / Self Care | Payer: Self-pay | Source: Home / Self Care | Attending: Surgery

## 2023-05-21 ENCOUNTER — Encounter (HOSPITAL_BASED_OUTPATIENT_CLINIC_OR_DEPARTMENT_OTHER): Payer: Self-pay | Admitting: Surgery

## 2023-05-21 ENCOUNTER — Ambulatory Visit (HOSPITAL_BASED_OUTPATIENT_CLINIC_OR_DEPARTMENT_OTHER): Payer: Medicare HMO | Admitting: Anesthesiology

## 2023-05-21 ENCOUNTER — Ambulatory Visit
Admission: RE | Admit: 2023-05-21 | Discharge: 2023-05-21 | Disposition: A | Discharge status: Home / Self Care | Payer: Medicare HMO | Source: Ambulatory Visit | Attending: Surgery | Admitting: Surgery

## 2023-05-21 ENCOUNTER — Other Ambulatory Visit: Payer: Self-pay

## 2023-05-21 ENCOUNTER — Ambulatory Visit (HOSPITAL_BASED_OUTPATIENT_CLINIC_OR_DEPARTMENT_OTHER)
Admission: RE | Admit: 2023-05-21 | Discharge: 2023-05-21 | Disposition: A | Discharge status: Home / Self Care | Payer: Medicare HMO | Attending: Surgery | Admitting: Surgery

## 2023-05-21 DIAGNOSIS — C50411 Malignant neoplasm of upper-outer quadrant of right female breast: Secondary | ICD-10-CM | POA: Insufficient documentation

## 2023-05-21 DIAGNOSIS — Z803 Family history of malignant neoplasm of breast: Secondary | ICD-10-CM | POA: Insufficient documentation

## 2023-05-21 DIAGNOSIS — F418 Other specified anxiety disorders: Secondary | ICD-10-CM | POA: Insufficient documentation

## 2023-05-21 DIAGNOSIS — Z01818 Encounter for other preprocedural examination: Secondary | ICD-10-CM

## 2023-05-21 DIAGNOSIS — Z170 Estrogen receptor positive status [ER+]: Secondary | ICD-10-CM | POA: Diagnosis not present

## 2023-05-21 DIAGNOSIS — E039 Hypothyroidism, unspecified: Secondary | ICD-10-CM | POA: Diagnosis not present

## 2023-05-21 DIAGNOSIS — L910 Hypertrophic scar: Secondary | ICD-10-CM | POA: Insufficient documentation

## 2023-05-21 DIAGNOSIS — Z9221 Personal history of antineoplastic chemotherapy: Secondary | ICD-10-CM | POA: Insufficient documentation

## 2023-05-21 DIAGNOSIS — C50911 Malignant neoplasm of unspecified site of right female breast: Secondary | ICD-10-CM

## 2023-05-21 DIAGNOSIS — R52 Pain, unspecified: Secondary | ICD-10-CM | POA: Diagnosis not present

## 2023-05-21 DIAGNOSIS — Z5986 Financial insecurity: Secondary | ICD-10-CM | POA: Insufficient documentation

## 2023-05-21 DIAGNOSIS — Z853 Personal history of malignant neoplasm of breast: Secondary | ICD-10-CM

## 2023-05-21 DIAGNOSIS — G8918 Other acute postprocedural pain: Secondary | ICD-10-CM | POA: Diagnosis not present

## 2023-05-21 HISTORY — DX: Hypothyroidism, unspecified: E03.9

## 2023-05-21 HISTORY — PX: BREAST LUMPECTOMY: SHX2

## 2023-05-21 HISTORY — PX: BREAST LUMPECTOMY WITH RADIOACTIVE SEED AND SENTINEL LYMPH NODE BIOPSY: SHX6550

## 2023-05-21 HISTORY — PX: EXCISION OF KELOID: SHX6267

## 2023-05-21 SURGERY — BREAST LUMPECTOMY WITH RADIOACTIVE SEED AND SENTINEL LYMPH NODE BIOPSY
Anesthesia: Regional | Site: Chest | Laterality: Right

## 2023-05-21 MED ORDER — EPHEDRINE SULFATE (PRESSORS) 50 MG/ML IJ SOLN
INTRAMUSCULAR | Status: DC | PRN
  Administered 2023-05-21 (×2): 10 mg via INTRAVENOUS

## 2023-05-21 MED ORDER — PROPOFOL 10 MG/ML IV BOLUS
INTRAVENOUS | Status: DC | PRN
  Administered 2023-05-21: 150 mg via INTRAVENOUS

## 2023-05-21 MED ORDER — FENTANYL CITRATE (PF) 100 MCG/2ML IJ SOLN
INTRAMUSCULAR | Status: AC
  Filled 2023-05-21: qty 2

## 2023-05-21 MED ORDER — HYDROMORPHONE HCL 1 MG/ML IJ SOLN
INTRAMUSCULAR | Status: AC
  Filled 2023-05-21: qty 0.5

## 2023-05-21 MED ORDER — PHENYLEPHRINE HCL (PRESSORS) 10 MG/ML IV SOLN
INTRAVENOUS | Status: DC | PRN
  Administered 2023-05-21: 80 ug via INTRAVENOUS

## 2023-05-21 MED ORDER — DEXAMETHASONE SODIUM PHOSPHATE 10 MG/ML IJ SOLN
INTRAMUSCULAR | Status: DC | PRN
  Administered 2023-05-21: 5 mg via INTRAVENOUS

## 2023-05-21 MED ORDER — PROPOFOL 10 MG/ML IV BOLUS
INTRAVENOUS | Status: AC
  Filled 2023-05-21: qty 20

## 2023-05-21 MED ORDER — CEFAZOLIN SODIUM-DEXTROSE 2-4 GM/100ML-% IV SOLN
INTRAVENOUS | Status: AC
  Filled 2023-05-21: qty 100

## 2023-05-21 MED ORDER — AMISULPRIDE (ANTIEMETIC) 5 MG/2ML IV SOLN: 10.0000 mg | Freq: Once | INTRAVENOUS | Status: DC | PRN

## 2023-05-21 MED ORDER — LACTATED RINGERS IV SOLN: INTRAVENOUS | Status: DC | PRN

## 2023-05-21 MED ORDER — MEPERIDINE HCL 25 MG/ML IJ SOLN: 6.2500 mg | INTRAMUSCULAR | Status: DC | PRN

## 2023-05-21 MED ORDER — HYDROMORPHONE HCL 1 MG/ML IJ SOLN
0.2500 mg | INTRAMUSCULAR | Status: DC | PRN
  Administered 2023-05-21 (×2): 0.25 mg via INTRAVENOUS

## 2023-05-21 MED ORDER — OXYCODONE HCL 5 MG/5ML PO SOLN: 5.0000 mg | Freq: Once | ORAL | Status: AC | PRN

## 2023-05-21 MED ORDER — MIDAZOLAM HCL 2 MG/2ML IJ SOLN
INTRAMUSCULAR | Status: AC
  Filled 2023-05-21: qty 2

## 2023-05-21 MED ORDER — BUPIVACAINE HCL (PF) 0.25 % IJ SOLN
INTRAMUSCULAR | Status: DC | PRN
Start: 2023-05-21 — End: 2023-05-21
  Administered 2023-05-21: 20 mL via PERINEURAL

## 2023-05-21 MED ORDER — MIDAZOLAM HCL 2 MG/2ML IJ SOLN
2.0000 mg | Freq: Once | INTRAMUSCULAR | Status: AC
  Administered 2023-05-21: 2 mg via INTRAVENOUS

## 2023-05-21 MED ORDER — MAGTRACE LYMPHATIC TRACER
INTRAMUSCULAR | Status: DC | PRN
  Administered 2023-05-21: 2 mL via INTRAMUSCULAR

## 2023-05-21 MED ORDER — ACETAMINOPHEN 500 MG PO TABS: 1000.0000 mg | ORAL_TABLET | ORAL | Status: DC

## 2023-05-21 MED ORDER — BUPIVACAINE-EPINEPHRINE 0.5% -1:200000 IJ SOLN
INTRAMUSCULAR | Status: DC | PRN
  Administered 2023-05-21: 20 mL

## 2023-05-21 MED ORDER — BUPIVACAINE LIPOSOME 1.3 % IJ SUSP
INTRAMUSCULAR | Status: DC | PRN
Start: 2023-05-21 — End: 2023-05-21
  Administered 2023-05-21: 10 mL via PERINEURAL

## 2023-05-21 MED ORDER — ACETAMINOPHEN 500 MG PO TABS
ORAL_TABLET | ORAL | Status: AC
  Filled 2023-05-21: qty 2

## 2023-05-21 MED ORDER — CEFAZOLIN SODIUM-DEXTROSE 2-4 GM/100ML-% IV SOLN: 2.0000 g | INTRAVENOUS | Status: DC

## 2023-05-21 MED ORDER — OXYCODONE HCL 5 MG PO TABS
ORAL_TABLET | ORAL | Status: AC
  Filled 2023-05-21: qty 1

## 2023-05-21 MED ORDER — FENTANYL CITRATE (PF) 100 MCG/2ML IJ SOLN
100.0000 ug | Freq: Once | INTRAMUSCULAR | Status: AC
  Administered 2023-05-21: 100 ug via INTRAVENOUS

## 2023-05-21 MED ORDER — KETOROLAC TROMETHAMINE 30 MG/ML IJ SOLN
INTRAMUSCULAR | Status: AC
  Filled 2023-05-21: qty 1

## 2023-05-21 MED ORDER — ONDANSETRON HCL 4 MG/2ML IJ SOLN: 4.0000 mg | Freq: Once | INTRAMUSCULAR | Status: DC | PRN

## 2023-05-21 MED ORDER — FENTANYL CITRATE (PF) 100 MCG/2ML IJ SOLN
INTRAMUSCULAR | Status: DC | PRN
  Administered 2023-05-21: 25 ug via INTRAVENOUS

## 2023-05-21 MED ORDER — OXYCODONE HCL 5 MG PO TABS
5.0000 mg | ORAL_TABLET | Freq: Four times a day (QID) | ORAL | 0 refills | Status: DC | PRN
Start: 2023-05-21 — End: 2023-09-03

## 2023-05-21 MED ORDER — BUPIVACAINE-EPINEPHRINE (PF) 0.5% -1:200000 IJ SOLN
INTRAMUSCULAR | Status: AC
  Filled 2023-05-21: qty 30

## 2023-05-21 MED ORDER — OXYCODONE HCL 5 MG PO TABS
5.0000 mg | ORAL_TABLET | Freq: Once | ORAL | Status: AC | PRN
  Administered 2023-05-21: 5 mg via ORAL

## 2023-05-21 MED ORDER — CEFAZOLIN SODIUM-DEXTROSE 2-3 GM-%(50ML) IV SOLR
INTRAVENOUS | Status: DC | PRN
  Administered 2023-05-21: 2 g via INTRAVENOUS

## 2023-05-21 MED ORDER — LACTATED RINGERS IV SOLN: INTRAVENOUS | Status: DC

## 2023-05-21 MED ORDER — LIDOCAINE 2% (20 MG/ML) 5 ML SYRINGE
INTRAMUSCULAR | Status: AC
  Filled 2023-05-21: qty 5

## 2023-05-21 MED ORDER — ACETAMINOPHEN 500 MG PO TABS
1000.0000 mg | ORAL_TABLET | Freq: Once | ORAL | Status: AC
  Administered 2023-05-21: 1000 mg via ORAL

## 2023-05-21 MED ORDER — KETOROLAC TROMETHAMINE 30 MG/ML IJ SOLN
30.0000 mg | Freq: Once | INTRAMUSCULAR | Status: AC | PRN
  Administered 2023-05-21: 30 mg via INTRAVENOUS

## 2023-05-21 MED ORDER — ONDANSETRON HCL 4 MG/2ML IJ SOLN
INTRAMUSCULAR | Status: DC | PRN
  Administered 2023-05-21: 4 mg via INTRAVENOUS

## 2023-05-21 MED ORDER — PROPOFOL 500 MG/50ML IV EMUL
INTRAVENOUS | Status: DC | PRN
Start: 2023-05-21 — End: 2023-05-21
  Administered 2023-05-21: 35 ug/kg/min via INTRAVENOUS

## 2023-05-21 MED ORDER — LIDOCAINE HCL (CARDIAC) PF 100 MG/5ML IV SOSY
PREFILLED_SYRINGE | INTRAVENOUS | Status: DC | PRN
  Administered 2023-05-21: 40 mg via INTRAVENOUS

## 2023-05-21 SURGICAL SUPPLY — 47 items
ADH SKN CLS APL DERMABOND .7 (GAUZE/BANDAGES/DRESSINGS) ×2
APL PRP STRL LF DISP 70% ISPRP (MISCELLANEOUS) ×2
APPLIER CLIP 9.375 MED OPEN (MISCELLANEOUS) ×2
APR CLP MED 9.3 20 MLT OPN (MISCELLANEOUS) ×2
BLADE SURG 15 STRL LF DISP TIS (BLADE) ×2 IMPLANT
BLADE SURG 15 STRL SS (BLADE) ×2
CANISTER SUCT 1200ML W/VALVE (MISCELLANEOUS) IMPLANT
CHLORAPREP W/TINT 26 (MISCELLANEOUS) ×2 IMPLANT
CLIP APPLIE 9.375 MED OPEN (MISCELLANEOUS) ×2 IMPLANT
COVER BACK TABLE 60X90IN (DRAPES) ×2 IMPLANT
COVER MAYO STAND STRL (DRAPES) ×2 IMPLANT
COVER PROBE CYLINDRICAL 5X96 (MISCELLANEOUS) ×2 IMPLANT
DERMABOND ADVANCED .7 DNX12 (GAUZE/BANDAGES/DRESSINGS) ×2 IMPLANT
DRAPE LAPAROSCOPIC ABDOMINAL (DRAPES) ×2 IMPLANT
DRAPE UTILITY XL STRL (DRAPES) ×2 IMPLANT
ELECT REM PT RETURN 9FT ADLT (ELECTROSURGICAL) ×2
ELECTRODE REM PT RTRN 9FT ADLT (ELECTROSURGICAL) ×2 IMPLANT
GAUZE SPONGE 4X4 12PLY STRL LF (GAUZE/BANDAGES/DRESSINGS) IMPLANT
GLOVE BIO SURGEON STRL SZ 6.5 (GLOVE) IMPLANT
GLOVE BIOGEL PI IND STRL 6.5 (GLOVE) IMPLANT
GLOVE SURG SIGNA 7.5 PF LTX (GLOVE) ×2 IMPLANT
GLOVE SURG SS PI 6.5 STRL IVOR (GLOVE) IMPLANT
GOWN STRL REUS W/ TWL LRG LVL3 (GOWN DISPOSABLE) ×2 IMPLANT
GOWN STRL REUS W/ TWL XL LVL3 (GOWN DISPOSABLE) ×2 IMPLANT
GOWN STRL REUS W/TWL LRG LVL3 (GOWN DISPOSABLE) ×4
GOWN STRL REUS W/TWL XL LVL3 (GOWN DISPOSABLE) ×2
KIT MARKER MARGIN INK (KITS) ×2 IMPLANT
NDL HYPO 25X1 1.5 SAFETY (NEEDLE) ×2 IMPLANT
NDL SAFETY ECLIP 18X1.5 (MISCELLANEOUS) ×2 IMPLANT
NEEDLE HYPO 25X1 1.5 SAFETY (NEEDLE) ×4
NS IRRIG 1000ML POUR BTL (IV SOLUTION) ×2 IMPLANT
PACK BASIN DAY SURGERY FS (CUSTOM PROCEDURE TRAY) ×2 IMPLANT
PENCIL SMOKE EVACUATOR (MISCELLANEOUS) ×2 IMPLANT
SLEEVE SCD COMPRESS KNEE MED (STOCKING) ×2 IMPLANT
SPONGE T-LAP 4X18 ~~LOC~~+RFID (SPONGE) ×2 IMPLANT
SUT MNCRL AB 4-0 PS2 18 (SUTURE) ×2 IMPLANT
SUT SILK 2 0 SH (SUTURE) IMPLANT
SUT VIC AB 2-0 SH 27 (SUTURE) ×2
SUT VIC AB 2-0 SH 27XBRD (SUTURE) IMPLANT
SUT VIC AB 3-0 SH 27 (SUTURE) ×2
SUT VIC AB 3-0 SH 27X BRD (SUTURE) ×2 IMPLANT
SYR CONTROL 10ML LL (SYRINGE) ×2 IMPLANT
TOWEL GREEN STERILE FF (TOWEL DISPOSABLE) ×2 IMPLANT
TRACER MAGTRACE VIAL (MISCELLANEOUS) IMPLANT
TRAY FAXITRON CT DISP (TRAY / TRAY PROCEDURE) ×2 IMPLANT
TUBE CONNECTING 20X1/4 (TUBING) IMPLANT
YANKAUER SUCT BULB TIP NO VENT (SUCTIONS) IMPLANT

## 2023-05-21 NOTE — Interval H&P Note (Signed)
History and Physical Interval Note:no change in H and P  05/21/2023 9:45 AM  Maria Carter  has presented today for surgery, with the diagnosis of RIGHT BREAST CANCER.  The various methods of treatment have been discussed with the patient and family. After consideration of risks, benefits and other options for treatment, the patient has consented to  Procedure(s) with comments: RIGHT BREAST LUMPECTOMY WITH RADIOACTIVE SEED AND SENTINEL LYMPH NODE BIOPSY (Right) - LMA PEC BLOCK EXCISION CHEST WALL SCAR (N/A) as a surgical intervention.  The patient's history has been reviewed, patient examined, no change in status, stable for surgery.  I have reviewed the patient's chart and labs.  Questions were answered to the patient's satisfaction.     [name]

## 2023-05-21 NOTE — Anesthesia Postprocedure Evaluation (Signed)
Anesthesia Post Note  Patient: ADRIANA MCKINSEY  Procedure(s) Performed: RIGHT BREAST LUMPECTOMY WITH RADIOACTIVE SEED AND SENTINEL LYMPH NODE BIOPSY (Right: Breast) EXCISION CHEST WALL SCAR (Chest)     Patient location during evaluation: PACU Anesthesia Type: Regional and General Level of consciousness: awake and alert, oriented and patient cooperative Pain management: pain level controlled Vital Signs Assessment: post-procedure vital signs reviewed and stable Respiratory status: spontaneous breathing, nonlabored ventilation and respiratory function stable Cardiovascular status: blood pressure returned to baseline and stable Postop Assessment: no apparent nausea or vomiting Anesthetic complications: no   No notable events documented.  Last Vitals:  Vitals:   05/21/23 1126 05/21/23 1130  BP:  125/70  Pulse: 69 76  Resp: 17 20  Temp:    SpO2: 100% 100%    Last Pain:  Vitals:   05/21/23 1130  TempSrc:   PainSc: 5                  Lannie Fields

## 2023-05-21 NOTE — Anesthesia Preprocedure Evaluation (Addendum)
Anesthesia Evaluation  Patient identified by MRN, date of birth, ID band Patient awake    Reviewed: Allergy & Precautions, NPO status , Patient's Chart, lab work & pertinent test results  Airway Mallampati: III  TM Distance: >3 FB Neck ROM: Full    Dental  (+) Teeth Intact, Dental Advisory Given   Pulmonary neg pulmonary ROS   Pulmonary exam normal breath sounds clear to auscultation       Cardiovascular negative cardio ROS Normal cardiovascular exam Rhythm:Regular Rate:Normal     Neuro/Psych  Headaches PSYCHIATRIC DISORDERS Anxiety Depression       GI/Hepatic negative GI ROS, Neg liver ROS,,,  Endo/Other  Hypothyroidism    Renal/GU negative Renal ROS  negative genitourinary   Musculoskeletal negative musculoskeletal ROS (+)    Abdominal   Peds  Hematology negative hematology ROS (+)   Anesthesia Other Findings   Reproductive/Obstetrics negative OB ROS                              Anesthesia Physical Anesthesia Plan  ASA: 2  Anesthesia Plan: General and Regional   Post-op Pain Management: Tylenol PO (pre-op)*, Regional block* and Toradol IV (intra-op)*   Induction: Intravenous  PONV Risk Score and Plan: 3 and Ondansetron, Dexamethasone, Midazolam and Treatment may vary due to age or medical condition  Airway Management Planned: LMA  Additional Equipment: None  Intra-op Plan:   Post-operative Plan: Extubation in OR  Informed Consent: I have reviewed the patients History and Physical, chart, labs and discussed the procedure including the risks, benefits and alternatives for the proposed anesthesia with the patient or authorized representative who has indicated his/her understanding and acceptance.     Dental advisory given  Plan Discussed with: CRNA  Anesthesia Plan Comments:        Anesthesia Quick Evaluation

## 2023-05-21 NOTE — Progress Notes (Signed)
Assisted Dr. Salvadore Farber with right, pectoralis, ultrasound guided block. Side rails up, monitors on throughout procedure. See vital signs in flow sheet. Tolerated Procedure well.

## 2023-05-21 NOTE — Discharge Instructions (Addendum)
[facility] Office Phone Number [phone]  BREAST BIOPSY/ PARTIAL MASTECTOMY: POST OP INSTRUCTIONS  Always review your discharge instruction sheet given to you by the facility where your surgery was performed.  IF YOU HAVE DISABILITY OR FAMILY LEAVE FORMS, YOU MUST BRING THEM TO THE OFFICE FOR PROCESSING.  DO NOT GIVE THEM TO YOUR DOCTOR.  A prescription for pain medication may be given to you upon discharge.  Take your pain medication as prescribed, if needed.  If narcotic pain medicine is not needed, then you may take acetaminophen (Tylenol) or ibuprofen (Advil) as needed. Take your usually prescribed medications unless otherwise directed If you need a refill on your pain medication, please contact your pharmacy.  They will contact our office to request authorization.  Prescriptions will not be filled after 5pm or on week-ends. You should eat very light the first 24 hours after surgery, such as soup, crackers, pudding, etc.  Resume your normal diet the day after surgery. Most patients will experience some swelling and bruising in the breast.  Ice packs and a good support bra will help.  Swelling and bruising can take several days to resolve.  It is common to experience some constipation if taking pain medication after surgery.  Increasing fluid intake and taking a stool softener will usually help or prevent this problem from occurring.  A mild laxative (Milk of Magnesia or Miralax) should be taken according to package directions if there are no bowel movements after 48 hours. Unless discharge instructions indicate otherwise, you may remove your bandages 24-48 hours after surgery, and you may shower at that time.  You may have steri-strips (small skin tapes) in place directly over the incision.  These strips should be left on the skin for 7-10 days.  If your surgeon used skin glue on the incision, you may shower in 24 hours.  The glue will flake off over the next 2-3 weeks.  Any  sutures or staples will be removed at the office during your follow-up visit. ACTIVITIES:  You may resume regular daily activities (gradually increasing) beginning the next day.  Wearing a good support bra or sports bra minimizes pain and swelling.  You may have sexual intercourse when it is comfortable. You may drive when you no longer are taking prescription pain medication, you can comfortably wear a seatbelt, and you can safely maneuver your car and apply brakes. RETURN TO WORK:  ______________________________________________________________________________________ Bonita Quin should see your doctor in the office for a follow-up appointment approximately two weeks after your surgery.  Your doctor's nurse will typically make your follow-up appointment when she calls you with your pathology report.  Expect your pathology report 2-3 business days after your surgery.  You may call to check if you do not hear from [place] after three days. OTHER INSTRUCTIONS: _YOU MAY REMOVE THE BINDER AND SHOWER STARTING TOMORROW THEN PLACE THE BINDER BACK ON ICE PACK, TYLENOL, AND IBUPROFEN ALSO FOR PAIN NO VIGOROUS ACTIVITY FOR ONE WEEK ______________________________________________________________________________________________ _____________________________________________________________________________________________________________________________________ _____________________________________________________________________________________________________________________________________ _____________________________________________________________________________________________________________________________________  WHEN TO CALL YOUR DOCTOR: Fever over 101.0 Nausea and/or vomiting. Extreme swelling or bruising. Continued bleeding from incision. Increased pain, redness, or drainage from the incision.  The clinic staff is available to answer your questions during regular business hours.  Please don't hesitate to call  and ask to speak to one of the nurses for clinical concerns.  If you have a medical emergency, go to the nearest emergency room or call 911.  A surgeon from [facility] is always on  call at the hospital.  For further questions, please visit [email or web address]   You may have Tylenol again after 2:40pm, if needed.   You may have Ibuprofen/NSAIDS again after 7:30 pm tonight, if needed.   Post Anesthesia Home Care Instructions  Activity: Get plenty of rest for the remainder of the day. A responsible individual must stay with you for 24 hours following the procedure.  For the next 24 hours, DO NOT: -Drive a car -Advertising copywriter -Drink alcoholic beverages -Take any medication unless instructed by your physician -Make any legal decisions or sign important papers.  Meals: Start with liquid foods such as gelatin or soup. Progress to regular foods as tolerated. Avoid greasy, spicy, heavy foods. If nausea and/or vomiting occur, drink only clear liquids until the nausea and/or vomiting subsides. Call your physician if vomiting continues.  Special Instructions/Symptoms: Your throat may feel dry or sore from the anesthesia or the breathing tube placed in your throat during surgery. If this causes discomfort, gargle with warm salt water. The discomfort should disappear within 24 hours.  If you had a scopolamine patch placed behind your ear for the management of post- operative nausea and/or vomiting:  1. The medication in the patch is effective for 72 hours, after which it should be removed.  Wrap patch in a tissue and discard in the trash. Wash hands thoroughly with soap and water. 2. You may remove the patch earlier than 72 hours if you experience unpleasant side effects which may include dry mouth, dizziness or visual disturbances. 3. Avoid touching the patch. Wash your hands with soap and water after contact with the patch.   Information for Discharge Teaching: EXPAREL  (bupivacaine liposome injectable suspension)   Pain relief is important to your recovery. The goal is to control your pain so you can move easier and return to your normal activities as soon as possible after your procedure. Your physician may use several types of medicines to manage pain, swelling, and more.  Your surgeon or anesthesiologist gave you EXPAREL(bupivacaine) to help control your pain after surgery.  EXPAREL is a local anesthetic designed to release slowly over an extended period of time to provide pain relief by numbing the tissue around the surgical site. EXPAREL is designed to release pain medication over time and can control pain for up to 72 hours. Depending on how you respond to EXPAREL, you may require less pain medication during your recovery. EXPAREL can help reduce or eliminate the need for opioids during the first few days after surgery when pain relief is needed the most. EXPAREL is not an opioid and is not addictive. It does not cause sleepiness or sedation.   Important! A teal colored band has been placed on your arm with the date, time and amount of EXPAREL you have received. Please leave this armband in place for the full 96 hours following administration, and then you may remove the band. If you return to the hospital for any reason within 96 hours following the administration of EXPAREL, the armband provides important information that your health care providers to know, and alerts them that you have received this anesthetic.    Possible side effects of EXPAREL: Temporary loss of sensation or ability to move in the area where medication was injected. Nausea, vomiting, constipation Rarely, numbness and tingling in your mouth or lips, lightheadedness, or anxiety may occur. Call your doctor right away if you think you may be experiencing any of these sensations, or if you have other questions  regarding possible side effects.  Follow all other discharge instructions  given to you by your surgeon or nurse. Eat a healthy diet and drink plenty of water or other fluids.

## 2023-05-21 NOTE — Op Note (Signed)
EZZIE SLACK 05/21/2023   Pre-op Diagnosis: RIGHT BREAST CANCER     Post-op Diagnosis: SAME  Procedure(s): RIGHT BREAST RADIOACTIVE SEED GUIDED LUMPECTOMY DEEP RIGHT AXILLARY SENTINEL LYMPH NODE BIOPSY EXCISION CHEST WALL SCAR (6 CM) INJECTION OF MAG TRACE FOR LYMPH NODE MAPPING  Surgeon(s): [name], MD  Anesthesia: General  Staff:  Circulator: Maryan Rued, RN Scrub Person: Kathryne Gin, Dawn M, CST  Estimated Blood Loss: Minimal               Specimens: sent to path  Indications: This is a 52 year old female who has completed neoadjuvant chemotherapy for right-sided breast cancer.  She now presents for a radioactive seed guided right breast lumpectomy and sentinel lymph node biopsy.  She also has an uncomfortable keloid in her central chest which she is requesting removal of his well..  Her MRI post neoadjuvant therapy shows that the breast cancer has decreased from 3 and half centimeters to about 1 and half centimeters in size.  Procedure: The patient was brought to the operating identifies the correct patient.  She is placed upon the operating room table general anesthesia was induced.  Under sterile technique I injected mag trace underneath the right nipple areolar complex and massaged the breast.  Her right breast, axilla, and chest were then prepped and draped in usual sterile fashion.  The keloid was on the central upper chest.  I anesthetized skin around this and made an elliptical incision approximately 6 cm x 1 and half centimeters in size and excised the scar completely with electrocautery.  I then created skin flap slightly and then closed the subcutaneous tissue with interrupted 2-0 Vicryl sutures and closed the skin with a running 4-0 Monocryl  With the aid of the neoprobe I then localized the radioactive seed in the right breast at approximate the 9 o'clock position laterally.  I anesthetized skin over this area with Marcaine and made a  longitudinal incision with a scalpel.  I then dissected down to the breast tissue with electrocautery.  The radioactive seed was located deep in the breast tissue.  I stayed widely around the signal in all directions going all the way down to the chest wall and then completed the lumpectomy taking the breast tissue off of the chest wall.  The posterior margin is the chest wall.  I marked all margins with paint.  An x-ray was performed confirming that the radioactive seed and previous biopsy clip were in the center of the specimen.  The lumpectomy specimen was then sent to pathology for evaluation.  I anesthetized skin in the right axilla next I made a small incision with a scalpel.  I then dissected down to the deep axillary tissue with the cautery.  With the aid of the mag trace probe I easily identified several sentinel lymph nodes which were also covered with the injected mag trace.  I elevated this with a Allis clamp and excised the nodes together in 1 specimen with the cautery and surgical clips.  The lymph nodes were sent to pathology for evaluation.  There is no other increased uptake of mag trace in the axilla and no large palpable nodes present.  I placed surgical clips around the periphery of the lumpectomy cavity for marking purposes.  I injected the cavity further with Marcaine.  Hemostasis appeared to be achieved.  I then closed the subcutaneous tissue both incisions with interrupted 3-0 Vicryl sutures and closed the skin of both incisions with running 4-0  Monocryl sutures.  Dermabond was then applied.  The patient tolerated the procedure well.  All the counts were correct at the end of the procedure.  She was placed back into a breast binder.  She was then extubated in the operating room and taken in a stable condition to the recovery room. [name]   Date: 05/21/2023  Time: 11:07 AM

## 2023-05-21 NOTE — Anesthesia Procedure Notes (Signed)
Anesthesia Regional Block: Pectoralis block   Pre-Anesthetic Checklist: , timeout performed,  Correct Patient, Correct Site, Correct Laterality,  Correct Procedure, Correct Position, site marked,  Risks and benefits discussed,  Surgical consent,  Pre-op evaluation,  At surgeon's request and post-op pain management  Laterality: Right  Prep: Maximum Sterile Barrier Precautions used, chloraprep       Needles:  Injection technique: Single-shot  Needle Type: Echogenic Stimulator Needle     Needle Length: 9cm  Needle Gauge: 22     Additional Needles:   Procedures:,,,, ultrasound used (permanent image in chart),,    Narrative:  Start time: 05/21/2023 8:55 AM End time: 05/21/2023 9:00 AM Injection made incrementally with aspirations every 5 mL.  Performed by: Personally  Anesthesiologist: Lannie Fields, DO  Additional Notes: Monitors applied. No increased pain on injection. No increased resistance to injection. Injection made in 5cc increments. Good needle visualization. Patient tolerated procedure well.

## 2023-05-21 NOTE — Transfer of Care (Signed)
Immediate Anesthesia Transfer of Care Note  Patient: Maria Carter  Procedure(s) Performed: RIGHT BREAST LUMPECTOMY WITH RADIOACTIVE SEED AND SENTINEL LYMPH NODE BIOPSY (Right: Breast) EXCISION CHEST WALL SCAR (Chest)  Patient Location: PACU  Anesthesia Type:General and regional   Level of Consciousness: awake, alert , and patient cooperative  Airway & Oxygen Therapy: Patient Spontanous Breathing and Patient connected to face mask oxygen  Post-op Assessment: Report given to RN and Post -op Vital signs reviewed and stable  Post vital signs: Reviewed and stable  Last Vitals:  Vitals Value Taken Time  BP 111/62 05/21/23 1109  Temp    Pulse 77 05/21/23 1111  Resp 19 05/21/23 1111  SpO2 100 % 05/21/23 1111  Vitals shown include unfiled device data.  Last Pain:  Vitals:   05/21/23 0837  TempSrc: Oral  PainSc: 0-No pain      Patients Stated Pain Goal: 5 (05/21/23 0837)  Complications: No notable events documented.

## 2023-05-22 ENCOUNTER — Encounter (HOSPITAL_BASED_OUTPATIENT_CLINIC_OR_DEPARTMENT_OTHER): Payer: Self-pay | Admitting: Surgery

## 2023-05-25 DIAGNOSIS — C50911 Malignant neoplasm of unspecified site of right female breast: Secondary | ICD-10-CM | POA: Diagnosis not present

## 2023-05-27 ENCOUNTER — Encounter: Payer: Self-pay | Admitting: *Deleted

## 2023-05-29 ENCOUNTER — Other Ambulatory Visit: Payer: Self-pay

## 2023-06-04 ENCOUNTER — Ambulatory Visit: Payer: Medicare HMO | Admitting: Hematology and Oncology

## 2023-06-08 ENCOUNTER — Other Ambulatory Visit: Payer: Self-pay | Admitting: Hematology and Oncology

## 2023-06-08 DIAGNOSIS — C50411 Malignant neoplasm of upper-outer quadrant of right female breast: Secondary | ICD-10-CM

## 2023-06-08 DIAGNOSIS — R7989 Other specified abnormal findings of blood chemistry: Secondary | ICD-10-CM

## 2023-06-09 ENCOUNTER — Inpatient Hospital Stay: Payer: Medicare HMO | Admitting: Hematology and Oncology

## 2023-06-09 ENCOUNTER — Inpatient Hospital Stay: Payer: Medicare HMO

## 2023-06-10 ENCOUNTER — Inpatient Hospital Stay: Payer: Medicare HMO | Attending: Physician Assistant

## 2023-06-10 ENCOUNTER — Inpatient Hospital Stay (HOSPITAL_BASED_OUTPATIENT_CLINIC_OR_DEPARTMENT_OTHER): Payer: Medicare HMO | Admitting: Hematology and Oncology

## 2023-06-10 ENCOUNTER — Inpatient Hospital Stay: Payer: Medicare HMO

## 2023-06-10 ENCOUNTER — Other Ambulatory Visit: Payer: Self-pay | Admitting: *Deleted

## 2023-06-10 ENCOUNTER — Other Ambulatory Visit: Payer: Self-pay

## 2023-06-10 VITALS — BP 118/69 | HR 68 | Temp 97.5°F | Resp 18 | Ht 61.0 in | Wt 160.3 lb

## 2023-06-10 DIAGNOSIS — F411 Generalized anxiety disorder: Secondary | ICD-10-CM | POA: Diagnosis not present

## 2023-06-10 DIAGNOSIS — Z95828 Presence of other vascular implants and grafts: Secondary | ICD-10-CM

## 2023-06-10 DIAGNOSIS — C50411 Malignant neoplasm of upper-outer quadrant of right female breast: Secondary | ICD-10-CM

## 2023-06-10 DIAGNOSIS — Z170 Estrogen receptor positive status [ER+]: Secondary | ICD-10-CM | POA: Insufficient documentation

## 2023-06-10 DIAGNOSIS — F330 Major depressive disorder, recurrent, mild: Secondary | ICD-10-CM | POA: Diagnosis not present

## 2023-06-10 DIAGNOSIS — Z79899 Other long term (current) drug therapy: Secondary | ICD-10-CM | POA: Diagnosis not present

## 2023-06-10 DIAGNOSIS — Z5111 Encounter for antineoplastic chemotherapy: Secondary | ICD-10-CM | POA: Insufficient documentation

## 2023-06-10 DIAGNOSIS — R7989 Other specified abnormal findings of blood chemistry: Secondary | ICD-10-CM

## 2023-06-10 DIAGNOSIS — F4310 Post-traumatic stress disorder, unspecified: Secondary | ICD-10-CM | POA: Diagnosis not present

## 2023-06-10 LAB — CMP (CANCER CENTER ONLY)
ALT: 10 U/L (ref 0–44)
AST: 17 U/L (ref 15–41)
Albumin: 4.3 g/dL (ref 3.5–5.0)
Alkaline Phosphatase: 69 U/L (ref 38–126)
Anion gap: 4 — ABNORMAL LOW (ref 5–15)
BUN: 15 mg/dL (ref 6–20)
CO2: 31 mmol/L (ref 22–32)
Calcium: 9.8 mg/dL (ref 8.9–10.3)
Chloride: 105 mmol/L (ref 98–111)
Creatinine: 0.78 mg/dL (ref 0.44–1.00)
GFR, Estimated: >60 mL/min (ref >60)
Glucose, Bld: 105 mg/dL — ABNORMAL HIGH (ref 70–99)
Potassium: 3.9 mmol/L (ref 3.5–5.1)
Sodium: 140 mmol/L (ref 135–145)
Total Bilirubin: 0.3 mg/dL (ref 0.3–1.2)
Total Protein: 7.6 g/dL (ref 6.5–8.1)

## 2023-06-10 LAB — TSH: TSH: 19.446 u[IU]/mL — ABNORMAL HIGH (ref 0.350–4.500)

## 2023-06-10 LAB — CBC WITH DIFFERENTIAL (CANCER CENTER ONLY)
Abs Immature Granulocytes: 0 10*3/uL (ref 0.00–0.07)
Basophils Absolute: 0 10*3/uL (ref 0.0–0.1)
Basophils Relative: 1 %
Eosinophils Absolute: 0.1 10*3/uL (ref 0.0–0.5)
Eosinophils Relative: 7 %
HCT: 33.4 % — ABNORMAL LOW (ref 36.0–46.0)
Hemoglobin: 10.8 g/dL — ABNORMAL LOW (ref 12.0–15.0)
Immature Granulocytes: 0 %
Lymphocytes Relative: 29 %
Lymphs Abs: 0.6 10*3/uL — ABNORMAL LOW (ref 0.7–4.0)
MCH: 30.4 pg (ref 26.0–34.0)
MCHC: 32.3 g/dL (ref 30.0–36.0)
MCV: 94.1 fL (ref 80.0–100.0)
Monocytes Absolute: 0.2 10*3/uL (ref 0.1–1.0)
Monocytes Relative: 11 %
Neutro Abs: 1.1 10*3/uL — ABNORMAL LOW (ref 1.7–7.7)
Neutrophils Relative %: 52 %
Platelet Count: 140 10*3/uL — ABNORMAL LOW (ref 150–400)
RBC: 3.55 MIL/uL — ABNORMAL LOW (ref 3.87–5.11)
RDW: 11.2 % — ABNORMAL LOW (ref 11.5–15.5)
WBC Count: 2.2 10*3/uL — ABNORMAL LOW (ref 4.0–10.5)
nRBC: 0 % (ref 0.0–0.2)

## 2023-06-10 LAB — PREGNANCY, URINE: Preg Test, Ur: NEGATIVE

## 2023-06-10 MED ORDER — SODIUM CHLORIDE 0.9% FLUSH
10.0000 mL | INTRAVENOUS | Status: DC | PRN
  Administered 2023-06-10: 10 mL

## 2023-06-10 MED ORDER — SODIUM CHLORIDE 0.9% FLUSH
10.0000 mL | Freq: Once | INTRAVENOUS | Status: AC
  Administered 2023-06-10: 10 mL

## 2023-06-10 MED ORDER — SODIUM CHLORIDE 0.9 % IV SOLN
200.0000 mg | Freq: Once | INTRAVENOUS | Status: AC
  Administered 2023-06-10: 200 mg via INTRAVENOUS
  Filled 2023-06-10: qty 200

## 2023-06-10 MED ORDER — SODIUM CHLORIDE 0.9 % IV SOLN: Freq: Once | INTRAVENOUS | Status: AC

## 2023-06-10 MED ORDER — HEPARIN SOD (PORK) LOCK FLUSH 100 UNIT/ML IV SOLN
500.0000 [IU] | Freq: Once | INTRAVENOUS | Status: AC | PRN
  Administered 2023-06-10: 500 [IU]

## 2023-06-10 NOTE — Assessment & Plan Note (Signed)
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  Residual cancer burden was rated at RCB II.  Repeat prognostics were not ordered.  Breast Cancer Post-lumpectomy with residual 1.8 cm tumor, previously estrogen weakly positive and progesterone negative. 8 lymph nodes removed, all clear of cancer. Healing well post-surgery. -Continue Keytruda immunotherapy. RCB II -Start radiation therapy, with simulation scheduled for tomorrow. -Consider oral chemotherapy with Xeloda after completion of radiation therapy, based on phase two studies suggesting potential benefit in patients without complete pathologic response. -Repeat prognostics on removed tumor to determine need for anti-estrogen pill.  Post-Surgical Healing Reports tenderness and tingling sensation, likely due to nerve regeneration. Some swelling noted. -Refer to physical therapy for lymphedema exercises.  Follow-up in 3 weeks for next [facility] treatment.

## 2023-06-10 NOTE — Progress Notes (Signed)
OK to treat today with ANC 1.1 per MD. Infusion RN aware.

## 2023-06-10 NOTE — Patient Instructions (Signed)

## 2023-06-10 NOTE — Progress Notes (Signed)
[facility] Cancer Follow up:    Maria Shackleton, NP-C [address] 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  This confirmed a suspicious 3 cm right upper outer quadrant mass and an abnormal right axillary lymph node with cortical thickening.  Tissue sampling of both the breast mass and axillary lymph node were recommended.  Left breast was normal.   09/26/2022 Breast US   Ultrasound breast confirmed the above-mentioned findings.   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  Prognostic showed ER 50% positive weak staining PR 0% negative HER2 1+ by IHC and Ki-67 of 70%   10/06/2022 Initial Diagnosis   Malignant neoplasm of upper-outer quadrant of right female breast (HCC)   10/08/2022 Cancer Staging   Staging form: Breast, AJCC 8th Edition - Clinical: Stage IIB (cT2, cN0, cM0, G3, ER+, PR-, HER2-) - Signed by [name], MD on 10/08/2022 Stage prefix: Initial diagnosis Histologic grading system: 3 grade system   10/24/2022 - 11/14/2022 Chemotherapy   Patient is on Treatment Plan : BREAST Pembrolizumab (200) D1 + Carboplatin (5) D1 + Paclitaxel (80) D1,8,15 q21d X 4 cycles / Pembrolizumab (200) D1 + AC D1 q21d x 4 cycles      Genetic Testing   [facility] Common Cancer Panel+RNA was Negative. Report date is 10/16/2022.  The Common Hereditary Cancers Panel offered by [facility]  includes sequencing and/or deletion duplication testing of the following 48 genes: APC, ATM, AXIN2, BAP1, BARD1, BMPR1A, BRCA1, BRCA2, BRIP1, CDH1, CDK4, CDKN2A (p14ARF and p16INK4a only), CHEK2, CTNNA1, DICER1, EPCAM (Deletion/duplication testing only), FH, GREM1 (promoter region duplication testing only), HOXB13, KIT, MBD4, MEN1, MLH1, MSH2, MSH3, MSH6, MUTYH, NF1, NHTL1, PALB2, PDGFRA, PMS2, POLD1, POLE, PTEN, RAD51C, RAD51D, SDHA (sequencing analysis only except exon 14), SDHB, SDHC, SDHD, SMAD4, SMARCA4. STK11, TP53, TSC1, TSC2, and VHL.   11/21/2022 - 04/08/2023 Chemotherapy   Patient is on Treatment Plan : BREAST Pembrolizumab (200) D1 + Carboplatin (1.5) D1,8,15 + Paclitaxel (80) D1,8,15 q21d X 4 cycles / Pembrolizumab (200) D1 + AC D1 q21d x 4 cycles     05/19/2023 -  Chemotherapy   Patient is on Treatment Plan : BREAST Pembrolizumab (200) q21d x 27 weeks       CURRENT THERAPY:  INTERVAL HISTORY:  Maria Carter 52 y.o. female returns for follow-up of her breast cancer prior to receiving treatment with pembrolizumab.   She is here for a follow-up after surgery to review final pathology.  She is scheduled to see radiation oncology soon.  She is recovering well except for swelling in the and some mild swelling at the surgical site but no overt issues.  Rest of the pertinent 10 point ROS reviewed and negative  Patient Active Problem List   Diagnosis Date Noted   Port-A-Cath in place 10/24/2022   Genetic testing 10/16/2022   Malignant neoplasm of upper-outer quadrant of right female breast (HCC) 10/06/2022   Thrombocytopenia (HCC) 03/05/2022   Leukopenia  03/05/2022   Hot flashes 02/28/2022   Hair thinning 02/28/2022   Amenorrhea 02/28/2022   Screening for colon cancer 02/28/2022   Multiple nevi 02/28/2022   PTSD (post-traumatic stress disorder) 02/08/2021   Mild episode of recurrent major depressive disorder (HCC) 09/12/2013   Attention deficit disorder 04/25/2013   Migraine  02/05/2013   KELOID SCAR 05/09/2010   URINALYSIS, ABNORMAL 05/09/2010   Anxiety state 03/01/2010   DEPRESSION 03/01/2010   Headache 03/01/2010   Intermittent chest pain 03/01/2010    has No Known Allergies.  MEDICAL HISTORY: Past Medical History:  Diagnosis Date   Anxiety    Breast cancer (HCC)    right breast IDC   Depression    Hx of febrile seizure    1980   Hypothyroidism     SURGICAL HISTORY: Past Surgical History:  Procedure Laterality Date   BREAST BIOPSY Right 09/30/2022   [place] RT BREAST BX W LOC DEV 1ST LESION IMG BX SPEC US GUIDE 09/30/2022 [facility]   BREAST BIOPSY  05/20/2023   MM RT RADIOACTIVE SEED LOC MAMMO GUIDE 05/20/2023 [facility]AMMO[facility]   BREAST LUMPECTOMY WITH RADIOACTIVE SEED AND SENTINEL LYMPH NODE BIOPSY Right 05/21/2023   Procedure: RIGHT BREAST LUMPECTOMY WITH RADIOACTIVE SEED AND SENTINEL LYMPH NODE BIOPSY;  Surgeon: [name], MD;  Location: [facility];  Service: General;  Laterality: Right;  LMA PEC BLOCK   EXCISION OF KELOID N/A 05/21/2023   Procedure: EXCISION CHEST WALL SCAR;  Surgeon: [name], MD;  Location: [facility];  Service: General;  Laterality: N/A;   PORTACATH PLACEMENT N/A 10/23/2022   Procedure: INSERTION PORT-A-CATH;  Surgeon: [name], MD;  Location: [facility];  Service: General;  Laterality: N/A;    SOCIAL HISTORY: Social History   Socioeconomic History   Marital status: Single    Spouse name: Not on file   Number of children: Not on file   Years of education: Not on file   Highest education level: Not on file  Occupational History   Not on file  Tobacco Use   Smoking status: Never   Smokeless tobacco: Never  Vaping Use   Vaping status: Never Used  Substance and Sexual Activity   Alcohol use: Not Currently    Comment: social   Drug use: No   Sexual activity: Yes    Partners: Male    Birth control/protection: None  Other Topics Concern    Not on file  Social History Narrative   Not on file   Social Determinants of Health   Financial Resource Strain: High Risk (10/08/2022)   Overall Financial Resource Strain (CARDIA)    Difficulty of Paying Living Expenses: Hard  Food Insecurity: Food Insecurity Present (10/08/2022)   Hunger Vital Sign    Worried About Running Out of Food in the Last Year: Sometimes true    Ran Out of Food in the Last Year: Sometimes true  Transportation Needs: No Transportation Needs (10/08/2022)   PRAPARE - Administrator, Civil Service (Medical): No    Lack of Transportation (Non-Medical): No  Physical Activity: Not on file  Stress: Not on file  Social Connections: Not on file  Intimate Partner Violence: Not on file    FAMILY HISTORY: Family History  Problem Relation Age of Onset   Breast cancer Sister 96 - 36   Cancer Paternal Grandmother        unknown type    Review of Systems  Constitutional:  Positive for fatigue.  Negative for appetite change, chills, fever and unexpected weight change.  HENT:   Negative for hearing loss, lump/mass and trouble swallowing.   Eyes:  Negative for eye problems and icterus.  Respiratory:  Negative for chest tightness, cough and shortness of breath.   Cardiovascular:  Negative for chest pain, leg swelling and palpitations.  Gastrointestinal:  Negative for abdominal distention, abdominal pain, constipation, diarrhea, nausea and vomiting.  Endocrine: Negative for hot flashes.  Genitourinary:  Negative for difficulty urinating.   Musculoskeletal:  Negative for arthralgias.  Skin:  Negative for itching and rash.  Neurological:  Negative for dizziness, extremity weakness, headaches and numbness.  Hematological:  Negative for adenopathy. Does not bruise/bleed easily.  Psychiatric/Behavioral:  Negative for depression. The patient is not nervous/anxious.       PHYSICAL EXAMINATION     Vitals:   06/10/23 1439  BP: 118/69  Pulse: 68  Resp: 18   Temp: (!) 97.5 F (36.4 C)  SpO2: 100%   Physical exam deferred in lieu of counseling  LABORATORY DATA:  CBC    Component Value Date/Time   WBC 2.2 (L) 06/10/2023 1343   WBC 2.9 (L) 02/28/2022 1049   RBC 3.55 (L) 06/10/2023 1343   HGB 10.8 (L) 06/10/2023 1343   HCT 33.4 (L) 06/10/2023 1343   PLT 140 (L) 06/10/2023 1343   MCV 94.1 06/10/2023 1343   MCH 30.4 06/10/2023 1343   MCHC 32.3 06/10/2023 1343   RDW 11.2 (L) 06/10/2023 1343   LYMPHSABS 0.6 (L) 06/10/2023 1343   MONOABS 0.2 06/10/2023 1343   EOSABS 0.1 06/10/2023 1343   BASOSABS 0.0 06/10/2023 1343    CMP     Component Value Date/Time   NA 140 06/10/2023 1343   K 3.9 06/10/2023 1343   CL 105 06/10/2023 1343   CO2 31 06/10/2023 1343   GLUCOSE 105 (H) 06/10/2023 1343   BUN 15 06/10/2023 1343   CREATININE 0.78 06/10/2023 1343   CALCIUM 9.8 06/10/2023 1343   PROT 7.6 06/10/2023 1343   ALBUMIN 4.3 06/10/2023 1343   AST 17 06/10/2023 1343   ALT 10 06/10/2023 1343   ALKPHOS 69 06/10/2023 1343   BILITOT 0.3 06/10/2023 1343   GFRNONAA >60 06/10/2023 1343   GFRAA >60 12/23/2015 1151         ASSESSMENT and THERAPY PLAN:   Malignant neoplasm of upper-outer quadrant of right female breast (HCC) Kimimila is a 52 year old woman with 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  Residual cancer burden was rated at RCB II.  Repeat prognostics were not ordered.  Breast Cancer Post-lumpectomy with residual 1.8 cm tumor, previously estrogen weakly positive and progesterone negative. 8 lymph nodes  removed, all clear of cancer. Healing well post-surgery. -Continue Keytruda immunotherapy. RCB II -Start radiation therapy, with simulation scheduled for tomorrow. -Consider oral chemotherapy with Xeloda after completion of radiation therapy, based on phase two studies suggesting potential benefit in patients without complete pathologic response. -Repeat prognostics on removed tumor to determine need for anti-estrogen pill.  Post-Surgical Healing Reports tenderness and tingling sensation, likely due to nerve regeneration. Some swelling noted. -Refer to physical therapy for lymphedema exercises.  Follow-up in 3 weeks for next [facility] treatment.   All questions were answered. The patient knows to call the clinic with any problems, questions or concerns. We can certainly see the patient much sooner if necessary.  Total encounter time:30 minutes*in face-to-face visit time, chart review, lab review, care coordination, order entry, and documentation of the encounter time.  *Total Encounter Time as defined by the Centers for Medicare and Medicaid Services includes, in addition to the face-to-face time of a patient visit (documented in the note above) non-face-to-face time: obtaining and reviewing outside history, ordering and reviewing medications, tests or procedures, care coordination (communications with other health care professionals or caregivers) and documentation in the medical record.

## 2023-06-11 ENCOUNTER — Encounter: Payer: Self-pay | Admitting: Radiation Oncology

## 2023-06-11 ENCOUNTER — Ambulatory Visit
Admission: RE | Admit: 2023-06-11 | Discharge: 2023-06-11 | Disposition: A | Discharge status: Home / Self Care | Payer: Medicare HMO | Source: Ambulatory Visit | Attending: Radiation Oncology | Admitting: Radiation Oncology

## 2023-06-11 VITALS — BP 108/61 | HR 71 | Temp 97.9°F | Resp 18 | Ht 61.0 in | Wt 161.6 lb

## 2023-06-11 DIAGNOSIS — Z803 Family history of malignant neoplasm of breast: Secondary | ICD-10-CM | POA: Diagnosis not present

## 2023-06-11 DIAGNOSIS — C50411 Malignant neoplasm of upper-outer quadrant of right female breast: Secondary | ICD-10-CM | POA: Diagnosis not present

## 2023-06-11 DIAGNOSIS — L910 Hypertrophic scar: Secondary | ICD-10-CM | POA: Insufficient documentation

## 2023-06-11 DIAGNOSIS — Z170 Estrogen receptor positive status [ER+]: Secondary | ICD-10-CM | POA: Diagnosis not present

## 2023-06-11 DIAGNOSIS — Z79890 Hormone replacement therapy (postmenopausal): Secondary | ICD-10-CM | POA: Insufficient documentation

## 2023-06-11 DIAGNOSIS — Z7952 Long term (current) use of systemic steroids: Secondary | ICD-10-CM | POA: Insufficient documentation

## 2023-06-11 DIAGNOSIS — E039 Hypothyroidism, unspecified: Secondary | ICD-10-CM | POA: Insufficient documentation

## 2023-06-11 DIAGNOSIS — Z79899 Other long term (current) drug therapy: Secondary | ICD-10-CM | POA: Diagnosis not present

## 2023-06-11 LAB — T4: T4, Total: 6.1 ug/dL (ref 4.5–12.0)

## 2023-06-11 NOTE — Progress Notes (Addendum)
Nursing interview for Malignant neoplasm of upper-outer quadrant of right female breast (HCC). Patient identity verified x2.  Patient reports RT breast tenderness at incision line 7/10. Patient denies chest pains or SOB. No other issues conveyed at this time.  Meaningful use complete. Perimenopausal- No chances of pregnancy. Negative pregnancy test 06/10/2023  Vitals- BP 108/61 (BP Location: Left Arm, Patient Position: Sitting, Cuff Size: Normal)   Pulse 71   Temp 97.9 F (36.6 C) (Temporal)   Resp 18   Ht 5\' 1"  (1.549 m)   Wt 161 lb 9.6 oz (73.3 kg)   SpO2 98%   BMI 30.53 kg/m   This concludes the interaction.  Ruel Favors, LPN

## 2023-06-11 NOTE — Progress Notes (Signed)
Radiation Oncology         ([phone]) [phone] ________________________________  Name: Maria Carter        MRN: [id number]  Date of Service: 06/11/2023 DOB: 1971-05-08  MW:[id number], Zorita Pang, NP-C  [name], MD     REFERRING PHYSICIAN: [name], MD   DIAGNOSIS: The encounter diagnosis was Malignant neoplasm of upper-outer quadrant of right female breast, unspecified estrogen receptor status (HCC).   HISTORY OF PRESENT ILLNESS: Maria Carter is a 52 y.o. female seen in the multidisciplinary breast clinic for a new diagnosis of right breast cancer.   The patient was noted to have a palpable mass in the right breast. She was found to have a 3 cm mass by ultrasound in the 10:00 position. Her right axillary lymph node was enlarged but was not reproducible at the time of biopsy.  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   Since the patient's last visit, pretty well overall.  No breast specific complaints are verbalized.  And given that she is felt to be functionally triple negative, she went on to complete a course of neoadjuvant chemotherapy between 10/24/2022 and 04/06/2023, she continues on consolidative Keytruda.  Her pretreatment MRI on 10/20/2022 showed her known disease in the right breast measuring 3.5 cm in greatest dimension with no evidence of additional disease in the right breast or evidence of abnormalities in the left, no evidence of adenopathy was appreciated either.  Her post chemotherapy MRI on 04/12/2023 showed a positive response to chemotherapy where the measurement overall of the tumor was 1.4 cm with no additional findings that were felt to be new.  She was offered surgical resection at that point and underwent a right lumpectomy with sentinel lymph node biopsy with Dr. Magnus Ivan on 05/21/2023.  Her scar was also surgically excised along the chest wall.  Final pathology shows underlying  scar along the chest wall negative for carcinoma, her right lumpectomy specimen showed a grade 3 metaplastic invasive ductal carcinoma measuring 1.8 cm with negative resection margins closest being the posterior margin at 3 mm, all 8 lymph nodes that were removed at the time in the axilla were negative for adenopathy.  Of note her tumor was retested and the results are pending. She is seen today to discuss treatment recommendations of her cancer.  PREVIOUS RADIATION THERAPY: No   PAST MEDICAL HISTORY:  Past Medical History:  Diagnosis Date   Anxiety    Breast cancer (HCC)    right breast IDC   Depression    Hx of febrile seizure    1980   Hypothyroidism        PAST SURGICAL HISTORY: Past Surgical History:  Procedure Laterality Date   BREAST BIOPSY Right 09/30/2022   [place] RT BREAST BX W LOC DEV 1ST LESION IMG BX SPEC US GUIDE 09/30/2022 [facility]   BREAST BIOPSY  05/20/2023   MM RT RADIOACTIVE SEED LOC MAMMO GUIDE 05/20/2023 [facility]MO[facility]   BREAST LUMPECTOMY WITH RADIOACTIVE SEED AND SENTINEL LYMPH NODE BIOPSY Right 05/21/2023   Procedure: RIGHT BREAST LUMPECTOMY WITH RADIOACTIVE SEED AND SENTINEL LYMPH NODE BIOPSY;  Surgeon: [name], MD;  Location: [facility];  Service: General;  Laterality: Right;  LMA PEC BLOCK   EXCISION OF KELOID N/A 05/21/2023   Procedure: EXCISION CHEST WALL SCAR;  Surgeon: [name], MD;  Location: [facility];  Service: General;  Laterality: N/A;   PORTACATH PLACEMENT N/A 10/23/2022   Procedure: INSERTION PORT-A-CATH;  Surgeon: [name], MD;  Location: [facility];  Service: General;  Laterality: N/A;     FAMILY HISTORY:  Family History  Problem Relation Age of Onset   Breast cancer Sister 3 - 35   Cancer Paternal Grandmother        unknown type     SOCIAL HISTORY:  reports that she has never smoked. She has never used smokeless tobacco. She reports that she does not  currently use alcohol. She reports that she does not use drugs. The patient is single and lives in [place]. She is on disability for her depression. She's accompanied by her partner Verdon Cummins. She enjoys dancing, fishing, and spending time with family.    ALLERGIES: Patient has no known allergies.   MEDICATIONS:  Current Outpatient Medications  Medication Sig Dispense Refill   B Complex Vitamins (B COMPLEX PO) Take by mouth.     busPIRone (BUSPAR) 15 MG tablet Take by mouth.     cholecalciferol (VITAMIN D3) 25 MCG (1000 UNIT) tablet Take 1,000 Units by mouth daily.     desvenlafaxine (PRISTIQ) 100 MG 24 hr tablet Take 100 mg by mouth daily.     dexamethasone (DECADRON) 4 MG tablet Take 1 tab PO BID with food x2 days after treatment 30 tablet 0   gabapentin (NEURONTIN) 300 MG capsule Take 1 capsule (300 mg total) by mouth at bedtime. 30 capsule 0   levothyroxine (SYNTHROID) 25 MCG tablet Take 2 tablets (50 mcg total) by mouth daily before breakfast. Dose being titrated 60 tablet 3   Multiple Vitamins-Minerals (MULTIVITAMIN ADULTS PO) Take 1 tablet by mouth daily.     oxyCODONE (OXY IR/ROXICODONE) 5 MG immediate release tablet Take 1 tablet (5 mg total) by mouth every 6 (six) hours as needed for moderate pain or severe pain. 25 tablet 0   prochlorperazine (COMPAZINE) 10 MG tablet Take 1 tablet (10 mg total) by mouth every 6 (six) hours as needed for nausea or vomiting. 30 tablet 0   traMADol (ULTRAM) 50 MG tablet Take 1 tablet (50 mg total) by mouth every 6 (six) hours as needed for moderate pain or severe pain. 25 tablet 0   triamcinolone (KENALOG) 0.025 % ointment Apply 1 Application topically 2 (two) times daily. Use for 7 days. Do not apply to face. 80 g 0   vitamin B-12 (CYANOCOBALAMIN) 100 MCG tablet Take 100 mcg by mouth daily.     No current facility-administered medications for this encounter.     REVIEW OF SYSTEMS: On review of systems, the patient reports that she is doing well  overall and is getting more energy back since treatment and feels like she is healing well at this time.      PHYSICAL EXAM:  Wt Readings from Last 3 Encounters:  06/11/23 161 lb 9.6 oz (73.3 kg)  06/10/23 160 lb 4.8 oz (72.7 kg)  05/21/23 159 lb 2.8 oz (72.2 kg)   Temp Readings from Last 3 Encounters:  06/11/23 97.9 F (36.6 C) (Temporal)  06/10/23 (!) 97.5 F (36.4 C) (Tympanic)  05/21/23 97.9 F (36.6 C)   BP Readings from Last 3 Encounters:  06/11/23 108/61  06/10/23 118/69  05/21/23 133/67   Pulse Readings from Last 3 Encounters:  06/11/23 71  06/10/23 68  05/21/23 67    In general this is a well appearing African American female in no acute distress. She's alert and oriented x4  and appropriate throughout the examination. Cardiopulmonary assessment is negative for acute distress and she exhibits normal effort. The right breast incision is well healed without erythema, separation or drainage, similar findings are also noted of the central incision along the chest consistent with the site of her previous keloid scar.    ECOG = 1  0 - Asymptomatic (Fully active, able to carry on all predisease activities without restriction)  1 - Symptomatic but completely ambulatory (Restricted in physically strenuous activity but ambulatory and able to carry out work of a light or sedentary nature. For example, light housework, office work)  2 - Symptomatic, <50% in bed during the day (Ambulatory and capable of all self care but unable to carry out any work activities. Up and about more than 50% of waking hours)  3 - Symptomatic, >50% in bed, but not bedbound (Capable of only limited self-care, confined to bed or chair 50% or more of waking hours)  4 - Bedbound (Completely disabled. Cannot carry on any self-care. Totally confined to bed or chair)  5 - Death   Santiago Glad MM, Creech RH, Tormey DC, et al. [id number]). "Toxicity and response criteria of the [facility]". Am.  Evlyn Clines. Oncol. 5 (6): 649-55    LABORATORY DATA:  Lab Results  Component Value Date   WBC 2.2 (L) 06/10/2023   HGB 10.8 (L) 06/10/2023   HCT 33.4 (L) 06/10/2023   MCV 94.1 06/10/2023   PLT 140 (L) 06/10/2023   Lab Results  Component Value Date   NA 140 06/10/2023   K 3.9 06/10/2023   CL 105 06/10/2023   CO2 31 06/10/2023   Lab Results  Component Value Date   ALT 10 06/10/2023   AST 17 06/10/2023   ALKPHOS 69 06/10/2023   BILITOT 0.3 06/10/2023      RADIOGRAPHY: MM Breast Surgical Specimen  Result Date: 05/21/2023 CLINICAL DATA:  Post excision of a right breast lesion following radioactive seed localization. Assess surgical specimen. EXAM: SPECIMEN RADIOGRAPH OF THE RIGHT BREAST COMPARISON:  Previous exam(s). FINDINGS: Status post excision of the right breast. The radioactive seed and biopsy marker clip are present, completely intact, and were marked for pathology. IMPRESSION: Specimen radiograph of the right breast. Electronically Signed   By: Amie Portland M.D.   On: 05/21/2023 10:42   MM RT RADIOACTIVE SEED LOC MAMMO GUIDE  Result Date: 05/20/2023 CLINICAL DATA:  52 year old female presents for radioactive seed localization of RIGHT breast cancer. EXAM: MAMMOGRAPHIC GUIDED RADIOACTIVE SEED LOCALIZATION OF THE RIGHT BREAST COMPARISON:  Previous exam(s). FINDINGS: Patient presents for radioactive seed localization prior to RIGHT lumpectomy. I met with the patient and we discussed the procedure of seed localization including benefits and alternatives. We discussed the high likelihood of a successful procedure. We discussed the risks of the procedure including infection, bleeding, tissue injury and further surgery. We discussed the low dose of radioactivity involved in the procedure. Informed, written consent was given. The usual time-out protocol was performed immediately prior to the procedure. Using mammographic guidance, sterile technique, 1% lidocaine and an I-125 radioactive  seed, the COIL biopsy clip/mass was localized using a LATERAL approach. The follow-up mammogram images confirm the seed in the expected location and were marked for Dr. Magnus Ivan. Follow-up survey of the patient confirms presence of the radioactive seed. Order number of I-125 seed:  [id number]. Total activity:  0.247 millicuries.  Reference Date: 04/23/2023. The patient tolerated the procedure well and was released from the [facility]. She was given  instructions regarding seed removal. IMPRESSION: Radioactive seed localization RIGHT breast. No apparent complications. Electronically Signed   By: Harmon Pier M.D.   On: 05/20/2023 14:18       IMPRESSION/PLAN: 1. 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 Dr. Mitzi Hansen has reviewed the pathology findings and today we discussed the nature of triple negative breast disease. She has done well since chemotherapy and since completing surgery. Dr. Mitzi Hansen recommends external radiotherapy to the breast  to reduce risks of local recurrence. Dr. Al Pimple is awaiting her surgical specimen to be retested for progostics, to determine adjuvant antiestrogen therapy if her tumor is still showing weak positivity. We discussed the risks, benefits, short, and long term effects of radiotherapy, as well as the curative intent, and the patient is interested in proceeding. We discussed the delivery and logistics of radiotherapy and that Dr. Mitzi Hansen recommends 4 weeks of radiotherapy to the right breast. Written consent is obtained and placed in the chart, a copy was provided to the patient. She will simulate today. 2.  Keloid scar.  This has been resected and the incision does not fall entirely into the treatment field. If her keloid were to recur, we could consider revising radiation for this purpose.  In a visit lasting 45 minutes, greater than 50% of the time was spent face to face reviewing her case, as well as in preparation of,  discussing, and coordinating the patient's care.      Osker Mason, [facility]    **Disclaimer: This note was dictated with voice recognition software. Similar sounding words can inadvertently be transcribed and this note may contain transcription errors which may not have been corrected upon publication of note.**

## 2023-06-12 LAB — SURGICAL PATHOLOGY

## 2023-06-13 ENCOUNTER — Other Ambulatory Visit: Payer: Self-pay

## 2023-06-16 ENCOUNTER — Other Ambulatory Visit: Payer: Self-pay

## 2023-06-18 ENCOUNTER — Encounter: Payer: Self-pay | Admitting: *Deleted

## 2023-06-20 DIAGNOSIS — C50411 Malignant neoplasm of upper-outer quadrant of right female breast: Secondary | ICD-10-CM | POA: Diagnosis not present

## 2023-06-20 DIAGNOSIS — Z170 Estrogen receptor positive status [ER+]: Secondary | ICD-10-CM | POA: Diagnosis not present

## 2023-06-23 ENCOUNTER — Telehealth: Payer: Self-pay | Admitting: Radiation Oncology

## 2023-06-23 NOTE — Telephone Encounter (Signed)
I spoke with the patient about needs to delay treatment start due to unexpected family emergency. She is open to starting on 07/06/23, knowing we may need to replan treatment depending on how she positions when she comes in for treatment.

## 2023-06-25 ENCOUNTER — Ambulatory Visit: Payer: Medicare HMO | Admitting: Radiation Oncology

## 2023-06-26 ENCOUNTER — Ambulatory Visit: Payer: Medicare HMO

## 2023-06-29 ENCOUNTER — Telehealth: Payer: Self-pay

## 2023-06-29 ENCOUNTER — Ambulatory Visit: Payer: Medicare HMO

## 2023-06-29 NOTE — Telephone Encounter (Signed)
Pt called to reschedule flush, MD, and inf appt for tomorrow 10/29. This RN sent scheduling a message to please call pt to get her rescheduled and canceled.

## 2023-06-30 ENCOUNTER — Ambulatory Visit: Payer: Medicare HMO

## 2023-06-30 ENCOUNTER — Inpatient Hospital Stay: Payer: Medicare HMO | Admitting: Hematology and Oncology

## 2023-06-30 ENCOUNTER — Inpatient Hospital Stay: Payer: Medicare HMO

## 2023-07-01 ENCOUNTER — Ambulatory Visit: Payer: Medicare HMO

## 2023-07-02 ENCOUNTER — Ambulatory Visit: Payer: Medicare HMO

## 2023-07-03 ENCOUNTER — Ambulatory Visit: Payer: Medicare HMO

## 2023-07-06 ENCOUNTER — Inpatient Hospital Stay (HOSPITAL_BASED_OUTPATIENT_CLINIC_OR_DEPARTMENT_OTHER): Payer: Medicare HMO | Admitting: Hematology and Oncology

## 2023-07-06 ENCOUNTER — Ambulatory Visit
Admission: RE | Admit: 2023-07-06 | Discharge: 2023-07-06 | Disposition: A | Discharge status: Home / Self Care | Payer: Medicare HMO | Source: Ambulatory Visit | Attending: Radiation Oncology | Admitting: Radiation Oncology

## 2023-07-06 ENCOUNTER — Inpatient Hospital Stay: Payer: Medicare HMO

## 2023-07-06 ENCOUNTER — Encounter: Payer: Self-pay | Admitting: Hematology and Oncology

## 2023-07-06 ENCOUNTER — Other Ambulatory Visit: Payer: Self-pay

## 2023-07-06 ENCOUNTER — Ambulatory Visit: Payer: Medicare HMO

## 2023-07-06 VITALS — BP 131/79 | HR 62 | Temp 97.5°F | Resp 18 | Wt 162.8 lb

## 2023-07-06 DIAGNOSIS — Z5111 Encounter for antineoplastic chemotherapy: Secondary | ICD-10-CM | POA: Insufficient documentation

## 2023-07-06 DIAGNOSIS — Z79899 Other long term (current) drug therapy: Secondary | ICD-10-CM | POA: Insufficient documentation

## 2023-07-06 DIAGNOSIS — Z170 Estrogen receptor positive status [ER+]: Secondary | ICD-10-CM | POA: Insufficient documentation

## 2023-07-06 DIAGNOSIS — C50411 Malignant neoplasm of upper-outer quadrant of right female breast: Secondary | ICD-10-CM

## 2023-07-06 DIAGNOSIS — M79602 Pain in left arm: Secondary | ICD-10-CM | POA: Insufficient documentation

## 2023-07-06 DIAGNOSIS — Z510 Encounter for antineoplastic radiation therapy: Secondary | ICD-10-CM | POA: Diagnosis not present

## 2023-07-06 DIAGNOSIS — Z95828 Presence of other vascular implants and grafts: Secondary | ICD-10-CM

## 2023-07-06 LAB — CBC WITH DIFFERENTIAL (CANCER CENTER ONLY)
Abs Immature Granulocytes: 0 10*3/uL (ref 0.00–0.07)
Basophils Absolute: 0 10*3/uL (ref 0.0–0.1)
Basophils Relative: 0 %
Eosinophils Absolute: 0.1 10*3/uL (ref 0.0–0.5)
Eosinophils Relative: 4 %
HCT: 34.2 % — ABNORMAL LOW (ref 36.0–46.0)
Hemoglobin: 11.3 g/dL — ABNORMAL LOW (ref 12.0–15.0)
Immature Granulocytes: 0 %
Lymphocytes Relative: 33 %
Lymphs Abs: 0.8 10*3/uL (ref 0.7–4.0)
MCH: 30 pg (ref 26.0–34.0)
MCHC: 33 g/dL (ref 30.0–36.0)
MCV: 90.7 fL (ref 80.0–100.0)
Monocytes Absolute: 0.3 10*3/uL (ref 0.1–1.0)
Monocytes Relative: 11 %
Neutro Abs: 1.3 10*3/uL — ABNORMAL LOW (ref 1.7–7.7)
Neutrophils Relative %: 52 %
Platelet Count: 154 10*3/uL (ref 150–400)
RBC: 3.77 MIL/uL — ABNORMAL LOW (ref 3.87–5.11)
RDW: 11.8 % (ref 11.5–15.5)
WBC Count: 2.5 10*3/uL — ABNORMAL LOW (ref 4.0–10.5)
nRBC: 0 % (ref 0.0–0.2)

## 2023-07-06 LAB — CMP (CANCER CENTER ONLY)
ALT: 11 U/L (ref 0–44)
AST: 21 U/L (ref 15–41)
Albumin: 4.3 g/dL (ref 3.5–5.0)
Alkaline Phosphatase: 85 U/L (ref 38–126)
Anion gap: 4 — ABNORMAL LOW (ref 5–15)
BUN: 13 mg/dL (ref 6–20)
CO2: 30 mmol/L (ref 22–32)
Calcium: 9.6 mg/dL (ref 8.9–10.3)
Chloride: 103 mmol/L (ref 98–111)
Creatinine: 0.95 mg/dL (ref 0.44–1.00)
GFR, Estimated: >60 mL/min (ref >60)
Glucose, Bld: 95 mg/dL (ref 70–99)
Potassium: 3.8 mmol/L (ref 3.5–5.1)
Sodium: 137 mmol/L (ref 135–145)
Total Bilirubin: 0.4 mg/dL (ref <1.2)
Total Protein: 7.7 g/dL (ref 6.5–8.1)

## 2023-07-06 LAB — PREGNANCY, URINE: Preg Test, Ur: NEGATIVE

## 2023-07-06 LAB — RAD ONC ARIA SESSION SUMMARY
Course Elapsed Days: 0
Plan Fractions Treated to Date: 1
Plan Prescribed Dose Per Fraction: 2.66 Gy
Plan Total Fractions Prescribed: 16
Plan Total Prescribed Dose: 42.56 Gy
Reference Point Dosage Given to Date: 2.66 Gy
Reference Point Session Dosage Given: 2.66 Gy
Session Number: 1

## 2023-07-06 LAB — TSH: TSH: 97.861 u[IU]/mL — ABNORMAL HIGH (ref 0.350–4.500)

## 2023-07-06 MED ORDER — SODIUM CHLORIDE 0.9% FLUSH
10.0000 mL | INTRAVENOUS | Status: DC | PRN
  Administered 2023-07-06: 10 mL

## 2023-07-06 MED ORDER — SODIUM CHLORIDE 0.9 % IV SOLN
200.0000 mg | Freq: Once | INTRAVENOUS | Status: AC
  Administered 2023-07-06: 200 mg via INTRAVENOUS
  Filled 2023-07-06: qty 200

## 2023-07-06 MED ORDER — SODIUM CHLORIDE 0.9 % IV SOLN: Freq: Once | INTRAVENOUS | Status: AC

## 2023-07-06 MED ORDER — HEPARIN SOD (PORK) LOCK FLUSH 100 UNIT/ML IV SOLN
500.0000 [IU] | Freq: Once | INTRAVENOUS | Status: AC | PRN
Start: 2023-07-06 — End: 2023-07-06
  Administered 2023-07-06: 500 [IU]

## 2023-07-06 MED ORDER — SODIUM CHLORIDE 0.9% FLUSH
10.0000 mL | Freq: Once | INTRAVENOUS | Status: AC
  Administered 2023-07-06: 10 mL

## 2023-07-06 NOTE — Assessment & Plan Note (Addendum)
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  Residual cancer burden was rated at RCB II.  Repeat prognostics showed ER 40% weak staining positive  Breast Cancer Post-lumpectomy with residual 1.8 cm tumor, previously estrogen weakly positive and progesterone negative. 8 lymph nodes removed, all clear of cancer. Healing well post-surgery. -Continue Keytruda immunotherapy. RCB II -Start radiation therapy scheduled for today. -Consider oral chemotherapy with Xeloda after completion of radiation therapy, based on phase two studies suggesting potential benefit in patients without complete pathologic response. -Repeat prognostics on removed tumor showed ER 40% weak staining.  We will consider antiestrogen therapy after she completes radiation.  Post-Surgical Healing Still has to schedule PT. Left arm pain is likely musculoskeletal since her breast cancer was on the right side.  I will continue to monitor this symptom and act accordingly.  Follow-up in 3 weeks for next [facility] treatment.

## 2023-07-06 NOTE — Progress Notes (Signed)
Per Dr. Al Pimple ok to treat today with ANC of 1.3.

## 2023-07-06 NOTE — Patient Instructions (Signed)

## 2023-07-06 NOTE — Progress Notes (Signed)
[facility] Cancer Follow up:    Maria Shackleton, NP-C [address] [place] [address]   DIAGNOSIS:  Cancer Staging  Malignant neoplasm of upper-outer quadrant of right female breast [facility]) Staging form: Breast, AJCC 8th Edition - Clinical: Stage IIB (cT2, cN0, cM0, G3, ER+, PR-, HER2-) - Signed by [name], MD on 10/08/2022 Stage prefix: Initial diagnosis Histologic grading system: 3 grade system   SUMMARY OF ONCOLOGIC HISTORY: Oncology History  Malignant neoplasm of upper-outer quadrant of right female breast (HCC)  09/26/2022 Mammogram   Patient had a palpable right breast mass and hence underwent bilateral diagnostic mammogram.  This confirmed a suspicious 3 cm right upper outer quadrant mass and an abnormal right axillary lymph node with cortical thickening.  Tissue sampling of both the breast mass and axillary lymph node were recommended.  Left breast was normal.   09/26/2022 Breast US   Ultrasound breast confirmed the above-mentioned findings.   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  Prognostic showed ER 50% positive weak staining PR 0% negative HER2 1+ by IHC and Ki-67 of 70%   10/06/2022 Initial Diagnosis   Malignant neoplasm of upper-outer quadrant of right female breast (HCC)   10/08/2022 Cancer Staging   Staging form: Breast, AJCC 8th Edition - Clinical: Stage IIB (cT2, cN0, cM0, G3, ER+, PR-, HER2-) - Signed by [name], MD on 10/08/2022 Stage prefix: Initial diagnosis Histologic grading system: 3 grade system   10/24/2022 - 11/14/2022 Chemotherapy   Patient is on Treatment Plan : BREAST Pembrolizumab (200) D1 + Carboplatin (5) D1 + Paclitaxel (80) D1,8,15 q21d X 4 cycles / Pembrolizumab (200) D1 + AC D1 q21d x 4 cycles      Genetic Testing   [facility] Common Cancer Panel+RNA was Negative. Report date is 10/16/2022.  The Common Hereditary Cancers Panel offered by [facility]  includes sequencing and/or deletion duplication testing of the following 48 genes: APC, ATM, AXIN2, BAP1, BARD1, BMPR1A, BRCA1, BRCA2, BRIP1, CDH1, CDK4, CDKN2A (p14ARF and p16INK4a only), CHEK2, CTNNA1, DICER1, EPCAM (Deletion/duplication testing only), FH, GREM1 (promoter region duplication testing only), HOXB13, KIT, MBD4, MEN1, MLH1, MSH2, MSH3, MSH6, MUTYH, NF1, NHTL1, PALB2, PDGFRA, PMS2, POLD1, POLE, PTEN, RAD51C, RAD51D, SDHA (sequencing analysis only except exon 14), SDHB, SDHC, SDHD, SMAD4, SMARCA4. STK11, TP53, TSC1, TSC2, and VHL.   11/21/2022 - 04/08/2023 Chemotherapy   Patient is on Treatment Plan : BREAST Pembrolizumab (200) D1 + Carboplatin (1.5) D1,8,15 + Paclitaxel (80) D1,8,15 q21d X 4 cycles / Pembrolizumab (200) D1 + AC D1 q21d x 4 cycles     05/19/2023 -  Chemotherapy   Patient is on Treatment Plan : BREAST Pembrolizumab (200) q21d x 27 weeks       CURRENT THERAPY:  INTERVAL HISTORY:  Maria Carter 52 y.o. female returns for follow-up of her breast cancer prior to receiving treatment with pembrolizumab.    Discussed the use of AI scribe software for clinical note transcription with the patient, who gave verbal consent to proceed.  History of Present Illness      The patient, with a history of breast cancer, here for a follow-up while she is undergoing adjuvant immunotherapy.  She reports some pain in the left arm in the past week but wonders if this is because she was doing quite a bit, traveling to [place] and helping take care of her mom and dad.  She will be starting adjuvant radiation today as  well.  The patient has not yet started physical therapy but has been doing exercises on her own. She plans to set up an appointment for physical therapy soon. She also mentions that she has been experiencing hot flashes.  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  MEDICAL HISTORY: Past Medical History:  Diagnosis Date   Anxiety    Breast cancer (HCC)    right breast IDC   Depression    Hx of febrile seizure    1980   Hypothyroidism     SURGICAL HISTORY: Past Surgical History:  Procedure Laterality Date   BREAST BIOPSY Right 09/30/2022   [place] RT BREAST BX W LOC DEV 1ST LESION IMG BX SPEC US GUIDE 09/30/2022 [facility]   BREAST BIOPSY  05/20/2023   MM RT RADIOACTIVE SEED LOC MAMMO GUIDE 05/20/2023 [facility]   BREAST LUMPECTOMY WITH RADIOACTIVE SEED AND SENTINEL LYMPH NODE BIOPSY Right 05/21/2023   Procedure: RIGHT BREAST LUMPECTOMY WITH RADIOACTIVE SEED AND SENTINEL LYMPH NODE BIOPSY;  Surgeon: [name], MD;  Location: [facility];  Service: General;  Laterality: Right;  LMA PEC BLOCK   EXCISION OF KELOID N/A 05/21/2023   Procedure: EXCISION CHEST WALL SCAR;  Surgeon: [name], MD;  Location: [facility];  Service: General;  Laterality: N/A;   PORTACATH PLACEMENT N/A 10/23/2022   Procedure: INSERTION PORT-A-CATH;  Surgeon: [name], MD;  Location: [facility];  Service: General;  Laterality: N/A;    SOCIAL HISTORY: Social History   Socioeconomic History   Marital status: Single    Spouse name: Not on file   Number of children:  Not on file   Years of education: Not on file   Highest education level: Not on file  Occupational History   Not on file  Tobacco Use   Smoking status: Never   Smokeless tobacco: Never  Vaping Use   Vaping status: Never Used  Substance and Sexual Activity   Alcohol use: Not Currently    Comment: social   Drug use: No   Sexual activity: Yes    Partners: Male    Birth control/protection: None  Other Topics Concern   Not on file  Social History Narrative   Not on file   Social Determinants of Health   Financial Resource Strain: High Risk (10/08/2022)   Overall Financial Resource Strain (CARDIA)    Difficulty of Paying Living Expenses: Hard  Food Insecurity: Food Insecurity Present (10/08/2022)   Hunger Vital Sign    Worried About Running Out of Food in the Last Year: Sometimes true    Ran Out of Food in the Last Year: Sometimes true  Transportation Needs: No Transportation Needs (10/08/2022)   PRAPARE - Transportation    Lack of Transportation (  Medical): No    Lack of Transportation (Non-Medical): No  Physical Activity: Not on file  Stress: Not on file  Social Connections: Not on file  Intimate Partner Violence: Not on file    FAMILY HISTORY: Family History  Problem Relation Age of Onset   Breast cancer Sister 22 - 57   Cancer Paternal Grandmother        unknown type    PHYSICAL EXAMINATION   Vitals:   07/06/23 1327  BP: 131/79  Pulse: 62  Resp: 18  Temp: (!) 97.5 F (36.4 C)  SpO2: 100%   Physical Exam Constitutional:      Appearance: Normal appearance.  Cardiovascular:     Rate and Rhythm: Normal rate and regular rhythm.     Pulses: Normal pulses.     Heart sounds: Normal heart sounds.  Pulmonary:     Effort: Pulmonary effort is normal.     Breath sounds: Normal breath sounds.  Musculoskeletal:        General: Normal range of motion.     Cervical back: Normal range of motion and neck supple. No rigidity.  Lymphadenopathy:     Cervical: No cervical  adenopathy.  Skin:    General: Skin is warm and dry.  Neurological:     Mental Status: She is alert.      LABORATORY DATA:  CBC    Component Value Date/Time   WBC 2.5 (L) 07/06/2023 1239   WBC 2.9 (L) 02/28/2022 1049   RBC 3.77 (L) 07/06/2023 1239   HGB 11.3 (L) 07/06/2023 1239   HCT 34.2 (L) 07/06/2023 1239   PLT 154 07/06/2023 1239   MCV 90.7 07/06/2023 1239   MCH 30.0 07/06/2023 1239   MCHC 33.0 07/06/2023 1239   RDW 11.8 07/06/2023 1239   LYMPHSABS 0.8 07/06/2023 1239   MONOABS 0.3 07/06/2023 1239   EOSABS 0.1 07/06/2023 1239   BASOSABS 0.0 07/06/2023 1239    CMP     Component Value Date/Time   NA 137 07/06/2023 1239   K 3.8 07/06/2023 1239   CL 103 07/06/2023 1239   CO2 30 07/06/2023 1239   GLUCOSE 95 07/06/2023 1239   BUN 13 07/06/2023 1239   CREATININE 0.95 07/06/2023 1239   CALCIUM 9.6 07/06/2023 1239   PROT 7.7 07/06/2023 1239   ALBUMIN 4.3 07/06/2023 1239   AST 21 07/06/2023 1239   ALT 11 07/06/2023 1239   ALKPHOS 85 07/06/2023 1239   BILITOT 0.4 07/06/2023 1239   GFRNONAA >60 07/06/2023 1239   GFRAA >60 12/23/2015 1151         ASSESSMENT and THERAPY PLAN:   Malignant neoplasm of upper-outer quadrant of right female breast (HCC) Tennie is a 52 year old woman with 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  Residual cancer burden was rated at RCB II.  Repeat prognostics showed ER 40% weak staining positive  Breast Cancer Post-lumpectomy with residual 1.8 cm tumor, previously estrogen weakly positive and progesterone  negative. 8 lymph nodes removed, all clear of cancer. Healing well post-surgery. -Continue Keytruda immunotherapy. RCB II -Start radiation therapy scheduled for today. -Consider oral chemotherapy with Xeloda after completion of radiation  therapy, based on phase two studies suggesting potential benefit in patients without complete pathologic response. -Repeat prognostics on removed tumor showed ER 40% weak staining.  We will consider antiestrogen therapy after she completes radiation.  Post-Surgical Healing Still has to schedule PT. Left arm pain is likely musculoskeletal since her breast cancer was on the right side.  I will continue to monitor this symptom and act accordingly.  Follow-up in 3 weeks for next [facility] treatment.  All questions were answered. The patient knows to call the clinic with any problems, questions or concerns. We can certainly see the patient much sooner if necessary.  Total encounter time:30 minutes*in face-to-face visit time, chart review, lab review, care coordination, order entry, and documentation of the encounter time.   *Total Encounter Time as defined by the Centers for Medicare and Medicaid Services includes, in addition to the face-to-face time of a patient visit (documented in the note above) non-face-to-face time: obtaining and reviewing outside history, ordering and reviewing medications, tests or procedures, care coordination (communications with other health care professionals or caregivers) and documentation in the medical record.

## 2023-07-07 ENCOUNTER — Telehealth: Payer: Self-pay

## 2023-07-07 ENCOUNTER — Other Ambulatory Visit: Payer: Self-pay

## 2023-07-07 ENCOUNTER — Ambulatory Visit
Admission: RE | Admit: 2023-07-07 | Discharge: 2023-07-07 | Disposition: A | Discharge status: Home / Self Care | Payer: Medicare HMO | Source: Ambulatory Visit | Attending: Radiation Oncology

## 2023-07-07 DIAGNOSIS — Z510 Encounter for antineoplastic radiation therapy: Secondary | ICD-10-CM | POA: Diagnosis not present

## 2023-07-07 DIAGNOSIS — Z170 Estrogen receptor positive status [ER+]: Secondary | ICD-10-CM | POA: Diagnosis not present

## 2023-07-07 DIAGNOSIS — C50411 Malignant neoplasm of upper-outer quadrant of right female breast: Secondary | ICD-10-CM | POA: Diagnosis not present

## 2023-07-07 DIAGNOSIS — M79602 Pain in left arm: Secondary | ICD-10-CM | POA: Diagnosis not present

## 2023-07-07 DIAGNOSIS — Z5111 Encounter for antineoplastic chemotherapy: Secondary | ICD-10-CM | POA: Diagnosis not present

## 2023-07-07 DIAGNOSIS — Z79899 Other long term (current) drug therapy: Secondary | ICD-10-CM | POA: Diagnosis not present

## 2023-07-07 LAB — T4: T4, Total: 2 ug/dL — ABNORMAL LOW (ref 4.5–12.0)

## 2023-07-07 LAB — RAD ONC ARIA SESSION SUMMARY
Course Elapsed Days: 1
Plan Fractions Treated to Date: 2
Plan Prescribed Dose Per Fraction: 2.66 Gy
Plan Total Fractions Prescribed: 16
Plan Total Prescribed Dose: 42.56 Gy
Reference Point Dosage Given to Date: 5.32 Gy
Reference Point Session Dosage Given: 2.66 Gy
Session Number: 2

## 2023-07-07 NOTE — Telephone Encounter (Signed)
This RN called pt to confirm if she was still taking the levothyroxine prescription as requested by Dr. Al Pimple. Pt states that she is not currently taking it as of appx 3 weeks ago due to concerns of side effects that she had been having. Pt describes these side effects as "feeling jittery," light-headed, anxious, and feeling her heart race. She also states that she feels she has developed brain fog.   This RN explained to pt that these can be considered normal side effects of this medication. This RN informed pt that this medication was helping her T4 levels and is most likely the cause of the drop of her T4 levels from appx 6 to 2 in 3 weeks. This RN states understanding of pt's concerns of side effects and states that she will inform Dr. Al Pimple so that she is aware of her concerns.    Pt states that she can resume taking the Levothyroxine in the morning. This RN states that she will inform Dr. Al Pimple. Pt verbalized understanding.

## 2023-07-08 ENCOUNTER — Other Ambulatory Visit: Payer: Self-pay

## 2023-07-08 ENCOUNTER — Ambulatory Visit
Admission: RE | Admit: 2023-07-08 | Discharge: 2023-07-08 | Disposition: A | Discharge status: Home / Self Care | Payer: Medicare HMO | Source: Ambulatory Visit | Attending: Radiation Oncology | Admitting: Radiation Oncology

## 2023-07-08 DIAGNOSIS — Z5111 Encounter for antineoplastic chemotherapy: Secondary | ICD-10-CM | POA: Diagnosis not present

## 2023-07-08 DIAGNOSIS — C50411 Malignant neoplasm of upper-outer quadrant of right female breast: Secondary | ICD-10-CM

## 2023-07-08 DIAGNOSIS — Z79899 Other long term (current) drug therapy: Secondary | ICD-10-CM | POA: Diagnosis not present

## 2023-07-08 DIAGNOSIS — Z510 Encounter for antineoplastic radiation therapy: Secondary | ICD-10-CM | POA: Diagnosis not present

## 2023-07-08 DIAGNOSIS — M79602 Pain in left arm: Secondary | ICD-10-CM | POA: Diagnosis not present

## 2023-07-08 DIAGNOSIS — Z170 Estrogen receptor positive status [ER+]: Secondary | ICD-10-CM | POA: Diagnosis not present

## 2023-07-08 DIAGNOSIS — R7989 Other specified abnormal findings of blood chemistry: Secondary | ICD-10-CM

## 2023-07-08 LAB — RAD ONC ARIA SESSION SUMMARY
Course Elapsed Days: 2
Plan Fractions Treated to Date: 3
Plan Prescribed Dose Per Fraction: 2.66 Gy
Plan Total Fractions Prescribed: 16
Plan Total Prescribed Dose: 42.56 Gy
Reference Point Dosage Given to Date: 7.98 Gy
Reference Point Session Dosage Given: 2.66 Gy
Session Number: 3

## 2023-07-09 ENCOUNTER — Ambulatory Visit
Admission: RE | Admit: 2023-07-09 | Discharge: 2023-07-09 | Disposition: A | Discharge status: Home / Self Care | Payer: Medicare HMO | Source: Ambulatory Visit | Attending: Radiation Oncology | Admitting: Radiation Oncology

## 2023-07-09 ENCOUNTER — Other Ambulatory Visit: Payer: Self-pay

## 2023-07-09 DIAGNOSIS — Z5111 Encounter for antineoplastic chemotherapy: Secondary | ICD-10-CM | POA: Diagnosis not present

## 2023-07-09 DIAGNOSIS — Z170 Estrogen receptor positive status [ER+]: Secondary | ICD-10-CM | POA: Diagnosis not present

## 2023-07-09 DIAGNOSIS — C50411 Malignant neoplasm of upper-outer quadrant of right female breast: Secondary | ICD-10-CM | POA: Diagnosis not present

## 2023-07-09 DIAGNOSIS — M79602 Pain in left arm: Secondary | ICD-10-CM | POA: Diagnosis not present

## 2023-07-09 DIAGNOSIS — Z510 Encounter for antineoplastic radiation therapy: Secondary | ICD-10-CM | POA: Diagnosis not present

## 2023-07-09 DIAGNOSIS — Z79899 Other long term (current) drug therapy: Secondary | ICD-10-CM | POA: Diagnosis not present

## 2023-07-09 LAB — RAD ONC ARIA SESSION SUMMARY
Course Elapsed Days: 3
Plan Fractions Treated to Date: 4
Plan Prescribed Dose Per Fraction: 2.66 Gy
Plan Total Fractions Prescribed: 16
Plan Total Prescribed Dose: 42.56 Gy
Reference Point Dosage Given to Date: 10.64 Gy
Reference Point Session Dosage Given: 2.66 Gy
Session Number: 4

## 2023-07-10 ENCOUNTER — Ambulatory Visit: Payer: Medicare HMO | Admitting: Radiation Oncology

## 2023-07-10 ENCOUNTER — Ambulatory Visit
Admission: RE | Admit: 2023-07-10 | Discharge: 2023-07-10 | Disposition: A | Discharge status: Home / Self Care | Payer: Medicare HMO | Source: Ambulatory Visit | Attending: Radiation Oncology

## 2023-07-10 ENCOUNTER — Ambulatory Visit
Admission: RE | Admit: 2023-07-10 | Discharge: 2023-07-10 | Disposition: A | Discharge status: Home / Self Care | Payer: Medicare HMO | Source: Ambulatory Visit | Attending: Radiation Oncology | Admitting: Radiation Oncology

## 2023-07-10 ENCOUNTER — Other Ambulatory Visit: Payer: Self-pay

## 2023-07-10 DIAGNOSIS — Z5111 Encounter for antineoplastic chemotherapy: Secondary | ICD-10-CM | POA: Diagnosis not present

## 2023-07-10 DIAGNOSIS — M79602 Pain in left arm: Secondary | ICD-10-CM | POA: Diagnosis not present

## 2023-07-10 DIAGNOSIS — C50411 Malignant neoplasm of upper-outer quadrant of right female breast: Secondary | ICD-10-CM | POA: Diagnosis not present

## 2023-07-10 DIAGNOSIS — Z79899 Other long term (current) drug therapy: Secondary | ICD-10-CM | POA: Diagnosis not present

## 2023-07-10 DIAGNOSIS — Z170 Estrogen receptor positive status [ER+]: Secondary | ICD-10-CM | POA: Diagnosis not present

## 2023-07-10 DIAGNOSIS — Z510 Encounter for antineoplastic radiation therapy: Secondary | ICD-10-CM | POA: Diagnosis not present

## 2023-07-10 LAB — RAD ONC ARIA SESSION SUMMARY
Course Elapsed Days: 4
Plan Fractions Treated to Date: 5
Plan Prescribed Dose Per Fraction: 2.66 Gy
Plan Total Fractions Prescribed: 16
Plan Total Prescribed Dose: 42.56 Gy
Reference Point Dosage Given to Date: 13.3 Gy
Reference Point Session Dosage Given: 2.66 Gy
Session Number: 5

## 2023-07-10 MED ORDER — RADIAPLEXRX EX GEL
Freq: Once | CUTANEOUS | Status: AC
Start: 2023-07-10 — End: 2023-07-10

## 2023-07-10 MED ORDER — ALRA NON-METALLIC DEODORANT (RAD-ONC)
1.0000 | Freq: Once | TOPICAL | Status: AC
Start: 2023-07-10 — End: 2023-07-10
  Administered 2023-07-10: 1 via TOPICAL

## 2023-07-11 DIAGNOSIS — Z5111 Encounter for antineoplastic chemotherapy: Secondary | ICD-10-CM | POA: Diagnosis not present

## 2023-07-11 DIAGNOSIS — C50411 Malignant neoplasm of upper-outer quadrant of right female breast: Secondary | ICD-10-CM | POA: Diagnosis not present

## 2023-07-11 DIAGNOSIS — Z170 Estrogen receptor positive status [ER+]: Secondary | ICD-10-CM | POA: Diagnosis not present

## 2023-07-11 DIAGNOSIS — Z79899 Other long term (current) drug therapy: Secondary | ICD-10-CM | POA: Diagnosis not present

## 2023-07-11 DIAGNOSIS — Z510 Encounter for antineoplastic radiation therapy: Secondary | ICD-10-CM | POA: Diagnosis not present

## 2023-07-11 DIAGNOSIS — M79602 Pain in left arm: Secondary | ICD-10-CM | POA: Diagnosis not present

## 2023-07-13 ENCOUNTER — Ambulatory Visit
Admission: RE | Admit: 2023-07-13 | Discharge: 2023-07-13 | Disposition: A | Discharge status: Home / Self Care | Payer: Medicare HMO | Source: Ambulatory Visit | Attending: Radiation Oncology | Admitting: Radiation Oncology

## 2023-07-13 ENCOUNTER — Other Ambulatory Visit: Payer: Self-pay

## 2023-07-13 DIAGNOSIS — C50411 Malignant neoplasm of upper-outer quadrant of right female breast: Secondary | ICD-10-CM | POA: Diagnosis not present

## 2023-07-13 DIAGNOSIS — Z510 Encounter for antineoplastic radiation therapy: Secondary | ICD-10-CM | POA: Diagnosis not present

## 2023-07-13 DIAGNOSIS — Z79899 Other long term (current) drug therapy: Secondary | ICD-10-CM | POA: Diagnosis not present

## 2023-07-13 DIAGNOSIS — Z5111 Encounter for antineoplastic chemotherapy: Secondary | ICD-10-CM | POA: Diagnosis not present

## 2023-07-13 DIAGNOSIS — Z170 Estrogen receptor positive status [ER+]: Secondary | ICD-10-CM | POA: Diagnosis not present

## 2023-07-13 DIAGNOSIS — M79602 Pain in left arm: Secondary | ICD-10-CM | POA: Diagnosis not present

## 2023-07-13 LAB — RAD ONC ARIA SESSION SUMMARY
Course Elapsed Days: 7
Plan Fractions Treated to Date: 6
Plan Prescribed Dose Per Fraction: 2.66 Gy
Plan Total Fractions Prescribed: 16
Plan Total Prescribed Dose: 42.56 Gy
Reference Point Dosage Given to Date: 15.96 Gy
Reference Point Session Dosage Given: 2.66 Gy
Session Number: 6

## 2023-07-14 ENCOUNTER — Other Ambulatory Visit: Payer: Self-pay

## 2023-07-14 ENCOUNTER — Ambulatory Visit
Admission: RE | Admit: 2023-07-14 | Discharge: 2023-07-14 | Disposition: A | Discharge status: Home / Self Care | Payer: Medicare HMO | Source: Ambulatory Visit | Attending: Radiation Oncology

## 2023-07-14 DIAGNOSIS — Z510 Encounter for antineoplastic radiation therapy: Secondary | ICD-10-CM | POA: Diagnosis not present

## 2023-07-14 DIAGNOSIS — C50411 Malignant neoplasm of upper-outer quadrant of right female breast: Secondary | ICD-10-CM | POA: Diagnosis not present

## 2023-07-14 DIAGNOSIS — Z5111 Encounter for antineoplastic chemotherapy: Secondary | ICD-10-CM | POA: Diagnosis not present

## 2023-07-14 DIAGNOSIS — M79602 Pain in left arm: Secondary | ICD-10-CM | POA: Diagnosis not present

## 2023-07-14 DIAGNOSIS — Z170 Estrogen receptor positive status [ER+]: Secondary | ICD-10-CM | POA: Diagnosis not present

## 2023-07-14 DIAGNOSIS — Z79899 Other long term (current) drug therapy: Secondary | ICD-10-CM | POA: Diagnosis not present

## 2023-07-14 LAB — RAD ONC ARIA SESSION SUMMARY
Course Elapsed Days: 8
Plan Fractions Treated to Date: 7
Plan Prescribed Dose Per Fraction: 2.66 Gy
Plan Total Fractions Prescribed: 16
Plan Total Prescribed Dose: 42.56 Gy
Reference Point Dosage Given to Date: 18.62 Gy
Reference Point Session Dosage Given: 2.66 Gy
Session Number: 7

## 2023-07-15 ENCOUNTER — Ambulatory Visit
Admission: RE | Admit: 2023-07-15 | Discharge: 2023-07-15 | Disposition: A | Discharge status: Home / Self Care | Payer: Medicare HMO | Source: Ambulatory Visit | Attending: Radiation Oncology | Admitting: Radiation Oncology

## 2023-07-15 ENCOUNTER — Other Ambulatory Visit: Payer: Self-pay

## 2023-07-15 DIAGNOSIS — Z5111 Encounter for antineoplastic chemotherapy: Secondary | ICD-10-CM | POA: Diagnosis not present

## 2023-07-15 DIAGNOSIS — M79602 Pain in left arm: Secondary | ICD-10-CM | POA: Diagnosis not present

## 2023-07-15 DIAGNOSIS — Z79899 Other long term (current) drug therapy: Secondary | ICD-10-CM | POA: Diagnosis not present

## 2023-07-15 DIAGNOSIS — Z170 Estrogen receptor positive status [ER+]: Secondary | ICD-10-CM | POA: Diagnosis not present

## 2023-07-15 DIAGNOSIS — Z510 Encounter for antineoplastic radiation therapy: Secondary | ICD-10-CM | POA: Diagnosis not present

## 2023-07-15 DIAGNOSIS — C50411 Malignant neoplasm of upper-outer quadrant of right female breast: Secondary | ICD-10-CM | POA: Diagnosis not present

## 2023-07-15 LAB — RAD ONC ARIA SESSION SUMMARY
Course Elapsed Days: 9
Plan Fractions Treated to Date: 8
Plan Prescribed Dose Per Fraction: 2.66 Gy
Plan Total Fractions Prescribed: 16
Plan Total Prescribed Dose: 42.56 Gy
Reference Point Dosage Given to Date: 21.28 Gy
Reference Point Session Dosage Given: 2.66 Gy
Session Number: 8

## 2023-07-16 ENCOUNTER — Ambulatory Visit
Admission: RE | Admit: 2023-07-16 | Discharge: 2023-07-16 | Disposition: A | Discharge status: Home / Self Care | Payer: Medicare HMO | Source: Ambulatory Visit | Attending: Radiation Oncology | Admitting: Radiation Oncology

## 2023-07-16 ENCOUNTER — Other Ambulatory Visit: Payer: Self-pay

## 2023-07-16 DIAGNOSIS — Z5111 Encounter for antineoplastic chemotherapy: Secondary | ICD-10-CM | POA: Diagnosis not present

## 2023-07-16 DIAGNOSIS — Z510 Encounter for antineoplastic radiation therapy: Secondary | ICD-10-CM | POA: Diagnosis not present

## 2023-07-16 DIAGNOSIS — M79602 Pain in left arm: Secondary | ICD-10-CM | POA: Diagnosis not present

## 2023-07-16 DIAGNOSIS — Z79899 Other long term (current) drug therapy: Secondary | ICD-10-CM | POA: Diagnosis not present

## 2023-07-16 DIAGNOSIS — C50411 Malignant neoplasm of upper-outer quadrant of right female breast: Secondary | ICD-10-CM | POA: Diagnosis not present

## 2023-07-16 DIAGNOSIS — Z170 Estrogen receptor positive status [ER+]: Secondary | ICD-10-CM | POA: Diagnosis not present

## 2023-07-16 LAB — RAD ONC ARIA SESSION SUMMARY
Course Elapsed Days: 10
Plan Fractions Treated to Date: 9
Plan Prescribed Dose Per Fraction: 2.66 Gy
Plan Total Fractions Prescribed: 16
Plan Total Prescribed Dose: 42.56 Gy
Reference Point Dosage Given to Date: 23.94 Gy
Reference Point Session Dosage Given: 2.66 Gy
Session Number: 9

## 2023-07-17 ENCOUNTER — Ambulatory Visit: Payer: Medicare HMO

## 2023-07-17 ENCOUNTER — Other Ambulatory Visit: Payer: Self-pay

## 2023-07-17 ENCOUNTER — Ambulatory Visit
Admission: RE | Admit: 2023-07-17 | Discharge: 2023-07-17 | Disposition: A | Discharge status: Home / Self Care | Payer: Medicare HMO | Source: Ambulatory Visit | Attending: Radiation Oncology | Admitting: Radiation Oncology

## 2023-07-17 DIAGNOSIS — Z5111 Encounter for antineoplastic chemotherapy: Secondary | ICD-10-CM | POA: Diagnosis not present

## 2023-07-17 DIAGNOSIS — Z170 Estrogen receptor positive status [ER+]: Secondary | ICD-10-CM | POA: Diagnosis not present

## 2023-07-17 DIAGNOSIS — Z510 Encounter for antineoplastic radiation therapy: Secondary | ICD-10-CM | POA: Diagnosis not present

## 2023-07-17 DIAGNOSIS — Z79899 Other long term (current) drug therapy: Secondary | ICD-10-CM | POA: Diagnosis not present

## 2023-07-17 DIAGNOSIS — C50411 Malignant neoplasm of upper-outer quadrant of right female breast: Secondary | ICD-10-CM | POA: Diagnosis not present

## 2023-07-17 DIAGNOSIS — M79602 Pain in left arm: Secondary | ICD-10-CM | POA: Diagnosis not present

## 2023-07-17 LAB — RAD ONC ARIA SESSION SUMMARY
Course Elapsed Days: 11
Plan Fractions Treated to Date: 10
Plan Prescribed Dose Per Fraction: 2.66 Gy
Plan Total Fractions Prescribed: 16
Plan Total Prescribed Dose: 42.56 Gy
Reference Point Dosage Given to Date: 26.6 Gy
Reference Point Session Dosage Given: 2.66 Gy
Session Number: 10

## 2023-07-20 ENCOUNTER — Other Ambulatory Visit: Payer: Self-pay

## 2023-07-20 ENCOUNTER — Ambulatory Visit
Admission: RE | Admit: 2023-07-20 | Discharge: 2023-07-20 | Disposition: A | Discharge status: Home / Self Care | Payer: Medicare HMO | Source: Ambulatory Visit | Attending: Radiation Oncology | Admitting: Radiation Oncology

## 2023-07-20 DIAGNOSIS — Z510 Encounter for antineoplastic radiation therapy: Secondary | ICD-10-CM | POA: Diagnosis not present

## 2023-07-20 DIAGNOSIS — M79602 Pain in left arm: Secondary | ICD-10-CM | POA: Diagnosis not present

## 2023-07-20 DIAGNOSIS — Z5111 Encounter for antineoplastic chemotherapy: Secondary | ICD-10-CM | POA: Diagnosis not present

## 2023-07-20 DIAGNOSIS — Z79899 Other long term (current) drug therapy: Secondary | ICD-10-CM | POA: Diagnosis not present

## 2023-07-20 DIAGNOSIS — C50411 Malignant neoplasm of upper-outer quadrant of right female breast: Secondary | ICD-10-CM | POA: Diagnosis not present

## 2023-07-20 DIAGNOSIS — Z170 Estrogen receptor positive status [ER+]: Secondary | ICD-10-CM | POA: Diagnosis not present

## 2023-07-20 LAB — RAD ONC ARIA SESSION SUMMARY
Course Elapsed Days: 14
Plan Fractions Treated to Date: 11
Plan Prescribed Dose Per Fraction: 2.66 Gy
Plan Total Fractions Prescribed: 16
Plan Total Prescribed Dose: 42.56 Gy
Reference Point Dosage Given to Date: 29.26 Gy
Reference Point Session Dosage Given: 2.66 Gy
Session Number: 11

## 2023-07-21 ENCOUNTER — Other Ambulatory Visit: Payer: Medicare HMO

## 2023-07-21 ENCOUNTER — Ambulatory Visit: Payer: Medicare HMO | Admitting: Adult Health

## 2023-07-21 ENCOUNTER — Ambulatory Visit
Admission: RE | Admit: 2023-07-21 | Discharge: 2023-07-21 | Disposition: A | Discharge status: Home / Self Care | Payer: Medicare HMO | Source: Ambulatory Visit | Attending: Radiation Oncology | Admitting: Radiation Oncology

## 2023-07-21 ENCOUNTER — Ambulatory Visit: Payer: Medicare HMO

## 2023-07-21 ENCOUNTER — Other Ambulatory Visit: Payer: Self-pay

## 2023-07-21 DIAGNOSIS — Z510 Encounter for antineoplastic radiation therapy: Secondary | ICD-10-CM | POA: Diagnosis not present

## 2023-07-21 DIAGNOSIS — M79602 Pain in left arm: Secondary | ICD-10-CM | POA: Diagnosis not present

## 2023-07-21 DIAGNOSIS — Z170 Estrogen receptor positive status [ER+]: Secondary | ICD-10-CM | POA: Diagnosis not present

## 2023-07-21 DIAGNOSIS — Z79899 Other long term (current) drug therapy: Secondary | ICD-10-CM | POA: Diagnosis not present

## 2023-07-21 DIAGNOSIS — C50411 Malignant neoplasm of upper-outer quadrant of right female breast: Secondary | ICD-10-CM | POA: Diagnosis not present

## 2023-07-21 DIAGNOSIS — Z5111 Encounter for antineoplastic chemotherapy: Secondary | ICD-10-CM | POA: Diagnosis not present

## 2023-07-21 LAB — RAD ONC ARIA SESSION SUMMARY
Course Elapsed Days: 15
Plan Fractions Treated to Date: 12
Plan Prescribed Dose Per Fraction: 2.66 Gy
Plan Total Fractions Prescribed: 16
Plan Total Prescribed Dose: 42.56 Gy
Reference Point Dosage Given to Date: 31.92 Gy
Reference Point Session Dosage Given: 2.66 Gy
Session Number: 12

## 2023-07-22 ENCOUNTER — Ambulatory Visit: Payer: Medicare HMO

## 2023-07-22 ENCOUNTER — Ambulatory Visit
Admission: RE | Admit: 2023-07-22 | Discharge: 2023-07-22 | Disposition: A | Discharge status: Home / Self Care | Payer: Medicare HMO | Source: Ambulatory Visit | Attending: Radiation Oncology

## 2023-07-22 ENCOUNTER — Other Ambulatory Visit: Payer: Self-pay

## 2023-07-22 DIAGNOSIS — Z510 Encounter for antineoplastic radiation therapy: Secondary | ICD-10-CM | POA: Diagnosis not present

## 2023-07-22 DIAGNOSIS — M79602 Pain in left arm: Secondary | ICD-10-CM | POA: Diagnosis not present

## 2023-07-22 DIAGNOSIS — Z170 Estrogen receptor positive status [ER+]: Secondary | ICD-10-CM | POA: Diagnosis not present

## 2023-07-22 DIAGNOSIS — Z5111 Encounter for antineoplastic chemotherapy: Secondary | ICD-10-CM | POA: Diagnosis not present

## 2023-07-22 DIAGNOSIS — Z79899 Other long term (current) drug therapy: Secondary | ICD-10-CM | POA: Diagnosis not present

## 2023-07-22 DIAGNOSIS — C50411 Malignant neoplasm of upper-outer quadrant of right female breast: Secondary | ICD-10-CM | POA: Diagnosis not present

## 2023-07-22 LAB — RAD ONC ARIA SESSION SUMMARY
Course Elapsed Days: 16
Plan Fractions Treated to Date: 13
Plan Prescribed Dose Per Fraction: 2.66 Gy
Plan Total Fractions Prescribed: 16
Plan Total Prescribed Dose: 42.56 Gy
Reference Point Dosage Given to Date: 34.58 Gy
Reference Point Session Dosage Given: 2.66 Gy
Session Number: 13

## 2023-07-23 ENCOUNTER — Ambulatory Visit
Admission: RE | Admit: 2023-07-23 | Discharge: 2023-07-23 | Disposition: A | Discharge status: Home / Self Care | Payer: Medicare HMO | Source: Ambulatory Visit | Attending: Radiation Oncology | Admitting: Radiation Oncology

## 2023-07-23 ENCOUNTER — Other Ambulatory Visit: Payer: Self-pay

## 2023-07-23 DIAGNOSIS — Z5111 Encounter for antineoplastic chemotherapy: Secondary | ICD-10-CM | POA: Diagnosis not present

## 2023-07-23 DIAGNOSIS — C50411 Malignant neoplasm of upper-outer quadrant of right female breast: Secondary | ICD-10-CM | POA: Diagnosis not present

## 2023-07-23 DIAGNOSIS — Z79899 Other long term (current) drug therapy: Secondary | ICD-10-CM | POA: Diagnosis not present

## 2023-07-23 DIAGNOSIS — M79602 Pain in left arm: Secondary | ICD-10-CM | POA: Diagnosis not present

## 2023-07-23 DIAGNOSIS — Z170 Estrogen receptor positive status [ER+]: Secondary | ICD-10-CM | POA: Diagnosis not present

## 2023-07-23 DIAGNOSIS — Z510 Encounter for antineoplastic radiation therapy: Secondary | ICD-10-CM | POA: Diagnosis not present

## 2023-07-23 LAB — RAD ONC ARIA SESSION SUMMARY
Course Elapsed Days: 17
Plan Fractions Treated to Date: 14
Plan Prescribed Dose Per Fraction: 2.66 Gy
Plan Total Fractions Prescribed: 16
Plan Total Prescribed Dose: 42.56 Gy
Reference Point Dosage Given to Date: 37.24 Gy
Reference Point Session Dosage Given: 2.66 Gy
Session Number: 14

## 2023-07-24 ENCOUNTER — Other Ambulatory Visit: Payer: Self-pay

## 2023-07-24 ENCOUNTER — Ambulatory Visit
Admission: RE | Admit: 2023-07-24 | Discharge: 2023-07-24 | Disposition: A | Discharge status: Home / Self Care | Payer: Medicare HMO | Source: Ambulatory Visit | Attending: Radiation Oncology

## 2023-07-24 ENCOUNTER — Ambulatory Visit
Admission: RE | Admit: 2023-07-24 | Discharge: 2023-07-24 | Disposition: A | Discharge status: Home / Self Care | Payer: Medicare HMO | Source: Ambulatory Visit | Attending: Radiation Oncology | Admitting: Radiation Oncology

## 2023-07-24 ENCOUNTER — Ambulatory Visit: Payer: Medicare HMO | Admitting: Radiation Oncology

## 2023-07-24 ENCOUNTER — Ambulatory Visit: Payer: Medicare HMO

## 2023-07-24 DIAGNOSIS — Z170 Estrogen receptor positive status [ER+]: Secondary | ICD-10-CM | POA: Diagnosis not present

## 2023-07-24 DIAGNOSIS — Z5111 Encounter for antineoplastic chemotherapy: Secondary | ICD-10-CM | POA: Diagnosis not present

## 2023-07-24 DIAGNOSIS — C50411 Malignant neoplasm of upper-outer quadrant of right female breast: Secondary | ICD-10-CM | POA: Diagnosis not present

## 2023-07-24 DIAGNOSIS — Z510 Encounter for antineoplastic radiation therapy: Secondary | ICD-10-CM | POA: Diagnosis not present

## 2023-07-24 DIAGNOSIS — Z79899 Other long term (current) drug therapy: Secondary | ICD-10-CM | POA: Diagnosis not present

## 2023-07-24 DIAGNOSIS — M79602 Pain in left arm: Secondary | ICD-10-CM | POA: Diagnosis not present

## 2023-07-24 LAB — RAD ONC ARIA SESSION SUMMARY
Course Elapsed Days: 18
Plan Fractions Treated to Date: 15
Plan Prescribed Dose Per Fraction: 2.66 Gy
Plan Total Fractions Prescribed: 16
Plan Total Prescribed Dose: 42.56 Gy
Reference Point Dosage Given to Date: 39.9 Gy
Reference Point Session Dosage Given: 2.66 Gy
Session Number: 15

## 2023-07-25 ENCOUNTER — Other Ambulatory Visit: Payer: Self-pay

## 2023-07-27 ENCOUNTER — Other Ambulatory Visit: Payer: Self-pay

## 2023-07-27 ENCOUNTER — Ambulatory Visit: Payer: Medicare HMO

## 2023-07-27 DIAGNOSIS — M79602 Pain in left arm: Secondary | ICD-10-CM | POA: Diagnosis not present

## 2023-07-27 DIAGNOSIS — Z170 Estrogen receptor positive status [ER+]: Secondary | ICD-10-CM | POA: Diagnosis not present

## 2023-07-27 DIAGNOSIS — Z79899 Other long term (current) drug therapy: Secondary | ICD-10-CM | POA: Diagnosis not present

## 2023-07-27 DIAGNOSIS — C50411 Malignant neoplasm of upper-outer quadrant of right female breast: Secondary | ICD-10-CM | POA: Diagnosis not present

## 2023-07-27 DIAGNOSIS — Z510 Encounter for antineoplastic radiation therapy: Secondary | ICD-10-CM | POA: Diagnosis not present

## 2023-07-27 DIAGNOSIS — Z5111 Encounter for antineoplastic chemotherapy: Secondary | ICD-10-CM | POA: Diagnosis not present

## 2023-07-27 LAB — RAD ONC ARIA SESSION SUMMARY
Course Elapsed Days: 21
Plan Fractions Treated to Date: 16
Plan Prescribed Dose Per Fraction: 2.66 Gy
Plan Total Fractions Prescribed: 16
Plan Total Prescribed Dose: 42.56 Gy
Reference Point Dosage Given to Date: 42.56 Gy
Reference Point Session Dosage Given: 2.66 Gy
Session Number: 16

## 2023-07-28 ENCOUNTER — Ambulatory Visit: Payer: Medicare HMO

## 2023-07-28 ENCOUNTER — Inpatient Hospital Stay (HOSPITAL_BASED_OUTPATIENT_CLINIC_OR_DEPARTMENT_OTHER): Payer: Medicare HMO | Admitting: Adult Health

## 2023-07-28 ENCOUNTER — Inpatient Hospital Stay: Payer: Medicare HMO

## 2023-07-28 ENCOUNTER — Other Ambulatory Visit: Payer: Self-pay

## 2023-07-28 ENCOUNTER — Ambulatory Visit (HOSPITAL_BASED_OUTPATIENT_CLINIC_OR_DEPARTMENT_OTHER)
Admission: RE | Admit: 2023-07-28 | Discharge: 2023-07-28 | Disposition: A | Discharge status: Home / Self Care | Payer: Medicare HMO | Source: Ambulatory Visit | Attending: Adult Health | Admitting: Adult Health

## 2023-07-28 ENCOUNTER — Encounter: Payer: Self-pay | Admitting: Adult Health

## 2023-07-28 VITALS — BP 126/72 | HR 64 | Temp 98.8°F | Resp 16

## 2023-07-28 VITALS — BP 106/53 | HR 71 | Temp 97.7°F | Resp 18 | Wt 160.3 lb

## 2023-07-28 DIAGNOSIS — C50411 Malignant neoplasm of upper-outer quadrant of right female breast: Secondary | ICD-10-CM

## 2023-07-28 DIAGNOSIS — M79602 Pain in left arm: Secondary | ICD-10-CM | POA: Insufficient documentation

## 2023-07-28 DIAGNOSIS — Z79899 Other long term (current) drug therapy: Secondary | ICD-10-CM | POA: Diagnosis not present

## 2023-07-28 DIAGNOSIS — Z5111 Encounter for antineoplastic chemotherapy: Secondary | ICD-10-CM | POA: Diagnosis not present

## 2023-07-28 DIAGNOSIS — Z510 Encounter for antineoplastic radiation therapy: Secondary | ICD-10-CM | POA: Diagnosis not present

## 2023-07-28 DIAGNOSIS — Z95828 Presence of other vascular implants and grafts: Secondary | ICD-10-CM

## 2023-07-28 DIAGNOSIS — Z170 Estrogen receptor positive status [ER+]: Secondary | ICD-10-CM | POA: Diagnosis not present

## 2023-07-28 LAB — CMP (CANCER CENTER ONLY)
ALT: 9 U/L (ref 0–44)
AST: 19 U/L (ref 15–41)
Albumin: 4.2 g/dL (ref 3.5–5.0)
Alkaline Phosphatase: 75 U/L (ref 38–126)
Anion gap: 6 (ref 5–15)
BUN: 15 mg/dL (ref 6–20)
CO2: 30 mmol/L (ref 22–32)
Calcium: 9.5 mg/dL (ref 8.9–10.3)
Chloride: 105 mmol/L (ref 98–111)
Creatinine: 0.92 mg/dL (ref 0.44–1.00)
GFR, Estimated: >60 mL/min
Glucose, Bld: 109 mg/dL — ABNORMAL HIGH (ref 70–99)
Potassium: 3.6 mmol/L (ref 3.5–5.1)
Sodium: 141 mmol/L (ref 135–145)
Total Bilirubin: 0.3 mg/dL
Total Protein: 7.7 g/dL (ref 6.5–8.1)

## 2023-07-28 LAB — CBC WITH DIFFERENTIAL (CANCER CENTER ONLY)
Abs Immature Granulocytes: 0 10*3/uL (ref 0.00–0.07)
Basophils Absolute: 0 10*3/uL (ref 0.0–0.1)
Basophils Relative: 1 %
Eosinophils Absolute: 0.1 10*3/uL (ref 0.0–0.5)
Eosinophils Relative: 3 %
HCT: 31.7 % — ABNORMAL LOW (ref 36.0–46.0)
Hemoglobin: 10.4 g/dL — ABNORMAL LOW (ref 12.0–15.0)
Immature Granulocytes: 0 %
Lymphocytes Relative: 24 %
Lymphs Abs: 0.4 10*3/uL — ABNORMAL LOW (ref 0.7–4.0)
MCH: 30 pg (ref 26.0–34.0)
MCHC: 32.8 g/dL (ref 30.0–36.0)
MCV: 91.4 fL (ref 80.0–100.0)
Monocytes Absolute: 0.2 10*3/uL (ref 0.1–1.0)
Monocytes Relative: 13 %
Neutro Abs: 1 10*3/uL — ABNORMAL LOW (ref 1.7–7.7)
Neutrophils Relative %: 59 %
Platelet Count: 140 10*3/uL — ABNORMAL LOW (ref 150–400)
RBC: 3.47 MIL/uL — ABNORMAL LOW (ref 3.87–5.11)
RDW: 12.2 % (ref 11.5–15.5)
WBC Count: 1.8 10*3/uL — ABNORMAL LOW (ref 4.0–10.5)
nRBC: 0 % (ref 0.0–0.2)

## 2023-07-28 LAB — RAD ONC ARIA SESSION SUMMARY
Course Elapsed Days: 22
Plan Fractions Treated to Date: 1
Plan Prescribed Dose Per Fraction: 2 Gy
Plan Total Fractions Prescribed: 4
Plan Total Prescribed Dose: 8 Gy
Reference Point Dosage Given to Date: 2 Gy
Reference Point Session Dosage Given: 2 Gy
Session Number: 17

## 2023-07-28 LAB — PREGNANCY, URINE: Preg Test, Ur: NEGATIVE

## 2023-07-28 MED ORDER — SODIUM CHLORIDE 0.9% FLUSH
10.0000 mL | Freq: Once | INTRAVENOUS | Status: AC
Start: 2023-07-28 — End: 2023-07-28
  Administered 2023-07-28: 10 mL

## 2023-07-28 MED ORDER — HEPARIN SOD (PORK) LOCK FLUSH 100 UNIT/ML IV SOLN
500.0000 [IU] | Freq: Once | INTRAVENOUS | Status: AC | PRN
  Administered 2023-07-28: 500 [IU]

## 2023-07-28 MED ORDER — SODIUM CHLORIDE 0.9 % IV SOLN
200.0000 mg | Freq: Once | INTRAVENOUS | Status: AC
  Administered 2023-07-28: 200 mg via INTRAVENOUS
  Filled 2023-07-28: qty 200

## 2023-07-28 MED ORDER — SODIUM CHLORIDE 0.9 % IV SOLN: Freq: Once | INTRAVENOUS | Status: AC

## 2023-07-28 MED ORDER — SODIUM CHLORIDE 0.9% FLUSH
10.0000 mL | INTRAVENOUS | Status: DC | PRN
  Administered 2023-07-28: 10 mL

## 2023-07-28 NOTE — Progress Notes (Signed)
Per Lillard Anes, NP - okay to proceed with treatment with ANC of 1.0.

## 2023-07-28 NOTE — Assessment & Plan Note (Signed)
Cancer Treatment Undergoing Keytruda and adjuvant radiation therapy. Reports skin breakdown and nausea. Noted downtrend in white blood cell count WBC 1.8, ANC 1.0--possibly related to [facility]. -Reviewed with Dr. Al Pimple and determined 5% of patients receiving Keytruda can develop leukopenia.  In absence of other symptoms (fevers, chills), patient will proceed with treatment and we will monitor WBC.  -Continue current treatment plan. -Continue radiation which will finish 08/07/2023  Anxiety Increased anxiety, possibly related to cancer diagnosis and treatment. Interfering with sleep and causing distress. -Encourage relaxation techniques and distraction methods. -Continue Pristiq as prescribed by psychiatry -Discuss with psychiatrist for possible medication adjustment.  Sleep Disturbance Difficulty sleeping due to anxiety and nightmares. -Encourage good sleep hygiene, including avoiding triggers before bedtime.  Left Arm Pain New onset, intermittent left arm pain. No significant swelling or other signs of port-associated blood clot. (Port-a-cath is in left chest wall).  -Order Doppler to rule out blood clot-if negative, will consider referral to cardiology -Order EKG to rule out cardiac issues, given left arm pain--EKG shows NSR  Hypothyroidism Last TSH elevated Patient taking Synthroid as prescribed Will recheck TSH and T4 in 3 weeks time.   Menopausal Symptoms Experiencing hot flashes, possibly related to cancer treatment and natural menopausal transition. -Continue monitoring symptoms. -Encourage regular exercise.  RTC in 3 weeks for labs, f/u, and treatment

## 2023-07-28 NOTE — Progress Notes (Signed)
Cancer Center Cancer Follow up:    Maria Shackleton, NP-C [address] [place] [address]   DIAGNOSIS:  Cancer Staging  Malignant neoplasm of upper-outer quadrant of right female breast [facility]) Staging form: Breast, AJCC 8th Edition - Clinical: Stage IIB (cT2, cN0, cM0, G3, ER+, PR-, HER2-) - Signed by [name], MD on 10/08/2022 Stage prefix: Initial diagnosis Histologic grading system: 3 grade system   SUMMARY OF ONCOLOGIC HISTORY: Oncology History  Malignant neoplasm of upper-outer quadrant of right female breast (HCC)  09/26/2022 Mammogram   Patient had a palpable right breast mass and hence underwent bilateral diagnostic mammogram.  This confirmed a suspicious 3 cm right upper outer quadrant mass and an abnormal right axillary lymph node with cortical thickening.  Tissue sampling of both the breast mass and axillary lymph node were recommended.  Left breast was normal.   09/26/2022 Breast US   Ultrasound breast confirmed the above-mentioned findings.   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  Prognostic showed ER 50% positive weak staining PR 0% negative HER2 1+ by IHC and Ki-67 of 70%   10/08/2022 Cancer Staging   Staging form: Breast, AJCC 8th Edition - Clinical: Stage IIB (cT2, cN0, cM0, G3, ER+, PR-, HER2-) - Signed by [name], MD on 10/08/2022 Stage prefix: Initial diagnosis Histologic grading system: 3 grade system   10/24/2022 - 11/14/2022 Chemotherapy   Patient is on Treatment Plan : BREAST Pembrolizumab (200) D1 + Carboplatin (5) D1 + Paclitaxel (80) D1,8,15 q21d X 4 cycles / Pembrolizumab (200) D1 + AC D1 q21d x 4 cycles      Genetic Testing   [facility] Common Cancer Panel+RNA was Negative. Report date is 10/16/2022.  The Common Hereditary Cancers Panel offered by [facility] includes sequencing and/or deletion duplication testing of the following 48 genes: APC, ATM, AXIN2, BAP1,  BARD1, BMPR1A, BRCA1, BRCA2, BRIP1, CDH1, CDK4, CDKN2A (p14ARF and p16INK4a only), CHEK2, CTNNA1, DICER1, EPCAM (Deletion/duplication testing only), FH, GREM1 (promoter region duplication testing only), HOXB13, KIT, MBD4, MEN1, MLH1, MSH2, MSH3, MSH6, MUTYH, NF1, NHTL1, PALB2, PDGFRA, PMS2, POLD1, POLE, PTEN, RAD51C, RAD51D, SDHA (sequencing analysis only except exon 14), SDHB, SDHC, SDHD, SMAD4, SMARCA4. STK11, TP53, TSC1, TSC2, and VHL.   11/21/2022 - 04/08/2023 Chemotherapy   Patient is on Treatment Plan : BREAST Pembrolizumab (200) D1 + Carboplatin (1.5) D1,8,15 + Paclitaxel (80) D1,8,15 q21d X 4 cycles / Pembrolizumab (200) D1 + AC D1 q21d x 4 cycles     05/19/2023 -  Chemotherapy   Patient is on Treatment Plan : BREAST Pembrolizumab (200) q21d x 27 weeks     05/21/2023 Surgery   Right lumpectomy: metaplastic IDC with chondroid differentiation, 1.8 cm, grade 3, margins negative, no LVI, 8 SLN negative, repeat prognostic panel: ER 40% positive, weak staining, PR 0% negative, Ki-67 80%, HER2 negative (0). RCB-II   07/06/2023 - 08/04/2023 Radiation Therapy   Adjuvant radiation therapy     CURRENT THERAPY: Keytruda/adjuvant radiation  INTERVAL HISTORY:  Discussed the use of AI scribe software for clinical note transcription with the patient, who gave verbal consent to proceed.  Maria Carter 52 y.o. female returns for f/u on breast cancer treatment with Keytruda and adjuvant radiation, reports some skin breakdown associated with the radiation therapy. She also reports intermittent nausea associated with the [facility]. The patient has been experiencing heightened anxiety, which she attributes to stress and worry, possibly related to her cancer diagnosis. This anxiety  has been disrupting her sleep, with the patient reporting frequent awakenings and distressing dreams.  The patient has a history of anxiety and is currently taking Pristiq. She has an upcoming appointment with her psychiatrist. The  patient also reports intermittent pain in her left arm and wrist, which has been ongoing for some time. The pain is not constant but comes and goes.  The patient has been experiencing some fatigue and shortness of breath, particularly noticeable when performing activities such as grocery shopping. She reports feeling winded and tired easily. The patient also mentions experiencing hot flashes, which she believes may be related to her cancer treatment pushing her into a menopausal state.  The patient's recent lab results show a decrease in white blood cells, which she is concerned about. She has not experienced any fever or chills, but does report fluctuations in body temperature, feeling hot and then cold, particularly at night. The patient is also on Synthroid (levothyroxine), which she takes first thing in the morning on an empty stomach. She has not reported any bowel or bladder issues.   Patient Active Problem List   Diagnosis Date Noted   Port-A-Cath in place 10/24/2022   Genetic testing 10/16/2022   Malignant neoplasm of upper-outer quadrant of right female breast (HCC) 10/06/2022   Thrombocytopenia (HCC) 03/05/2022   Leukopenia 03/05/2022   Hot flashes 02/28/2022   Hair thinning 02/28/2022   Amenorrhea 02/28/2022   Screening for colon cancer 02/28/2022   Multiple nevi 02/28/2022   PTSD (post-traumatic stress disorder) 02/08/2021   Mild episode of recurrent major depressive disorder (HCC) 09/12/2013   Attention deficit disorder 04/25/2013   Migraine 02/05/2013   KELOID SCAR 05/09/2010   URINALYSIS, ABNORMAL 05/09/2010   Anxiety state 03/01/2010   DEPRESSION 03/01/2010   Headache 03/01/2010   Intermittent chest pain 03/01/2010    has No Known Allergies.  MEDICAL HISTORY: Past Medical History:  Diagnosis Date   Anxiety    Breast cancer (HCC)    right breast IDC   Depression    Hx of febrile seizure    1980   Hypothyroidism     SURGICAL HISTORY: Past Surgical History:   Procedure Laterality Date   BREAST BIOPSY Right 09/30/2022   [place] RT BREAST BX W LOC DEV 1ST LESION IMG BX SPEC US GUIDE 09/30/2022 [facility]   BREAST BIOPSY  05/20/2023   MM RT RADIOACTIVE SEED LOC MAMMO GUIDE 05/20/2023 [facility] MAMMOGR[facility]   BREAST LUMPECTOMY WITH RADIOACTIVE SEED AND SENTINEL LYMPH NODE BIOPSY Right 05/21/2023   Procedure: RIGHT BREAST LUMPECTOMY WITH RADIOACTIVE SEED AND SENTINEL LYMPH NODE BIOPSY;  Surgeon: [name], MD;  Location: [facility];  Service: General;  Laterality: Right;  LMA PEC BLOCK   EXCISION OF KELOID N/A 05/21/2023   Procedure: EXCISION CHEST WALL SCAR;  Surgeon: [name], MD;  Location: [facility];  Service: General;  Laterality: N/A;   PORTACATH PLACEMENT N/A 10/23/2022   Procedure: INSERTION PORT-A-CATH;  Surgeon: [name], MD;  Location:  SURGERY CENTER;  Service: General;  Laterality: N/A;    SOCIAL HISTORY: Social History   Socioeconomic History   Marital status: Single    Spouse name: Not on file   Number of children: Not on file   Years of education: Not on file   Highest education level: Not on file  Occupational History   Not on file  Tobacco Use   Smoking status: Never   Smokeless tobacco: Never  Vaping Use   Vaping status:  Never Used  Substance and Sexual Activity   Alcohol use: Not Currently    Comment: social   Drug use: No   Sexual activity: Yes    Partners: Male    Birth control/protection: None  Other Topics Concern   Not on file  Social History Narrative   Not on file   Social Determinants of Health   Financial Resource Strain: High Risk (10/08/2022)   Overall Financial Resource Strain (CARDIA)    Difficulty of Paying Living Expenses: Hard  Food Insecurity: Food Insecurity Present (10/08/2022)   Hunger Vital Sign    Worried About Running Out of Food in the Last Year: Sometimes true    Ran Out of Food in the Last Year: Sometimes true   Transportation Needs: No Transportation Needs (10/08/2022)   PRAPARE - Administrator, Civil Service (Medical): No    Lack of Transportation (Non-Medical): No  Physical Activity: Not on file  Stress: Not on file  Social Connections: Not on file  Intimate Partner Violence: Not on file    FAMILY HISTORY: Family History  Problem Relation Age of Onset   Breast cancer Sister 45 - 106   Cancer Paternal Grandmother        unknown type    Review of Systems  Constitutional:  Positive for fatigue. Negative for appetite change, chills, fever and unexpected weight change.  HENT:   Negative for hearing loss, lump/mass and trouble swallowing.   Eyes:  Negative for eye problems and icterus.  Respiratory:  Positive for shortness of breath (per interval history). Negative for chest tightness and cough.   Cardiovascular:  Negative for chest pain, leg swelling and palpitations.  Gastrointestinal:  Negative for abdominal distention, abdominal pain, constipation, diarrhea, nausea and vomiting.  Endocrine: Negative for hot flashes.  Genitourinary:  Negative for difficulty urinating.   Musculoskeletal:  Negative for arthralgias.  Skin:  Negative for itching and rash.  Neurological:  Negative for dizziness, extremity weakness, headaches and numbness.  Hematological:  Negative for adenopathy. Does not bruise/bleed easily.  Psychiatric/Behavioral:  Negative for depression. The patient is nervous/anxious.       PHYSICAL EXAMINATION   Onc Performance Status - 07/28/23 1100       KPS SCALE   KPS % SCORE Normal, no compliants, no evidence of disease             Vitals:   07/28/23 1123  BP: (!) 106/53  Pulse: 71  Resp: 18  Temp: 97.7 F (36.5 C)  SpO2: 99%    Physical Exam Constitutional:      General: She is not in acute distress.    Appearance: Normal appearance. She is not toxic-appearing.  HENT:     Head: Normocephalic and atraumatic.     Mouth/Throat:     Mouth:  Mucous membranes are moist.     Pharynx: Oropharynx is clear. No oropharyngeal exudate or posterior oropharyngeal erythema.  Eyes:     General: No scleral icterus. Cardiovascular:     Rate and Rhythm: Normal rate and regular rhythm.     Pulses: Normal pulses.     Heart sounds: Normal heart sounds.  Pulmonary:     Effort: Pulmonary effort is normal.     Breath sounds: Normal breath sounds.  Chest:     Comments: Right axilla not examined, right breast visually inspected and skin is noted to be hyperpigmented, no desquamation noted Abdominal:     General: Abdomen is flat. Bowel sounds are normal. There is  no distension.     Palpations: Abdomen is soft.     Tenderness: There is no abdominal tenderness.  Musculoskeletal:        General: No swelling.     Cervical back: Neck supple.  Lymphadenopathy:     Cervical: No cervical adenopathy.     Upper Body:     Left upper body: No supraclavicular or axillary adenopathy.  Skin:    General: Skin is warm and dry.     Findings: No rash.  Neurological:     General: No focal deficit present.     Mental Status: She is alert.  Psychiatric:        Mood and Affect: Mood normal.        Behavior: Behavior normal.     LABORATORY DATA:  CBC    Component Value Date/Time   WBC 1.8 (L) 07/28/2023 1104   WBC 2.9 (L) 02/28/2022 1049   RBC 3.47 (L) 07/28/2023 1104   HGB 10.4 (L) 07/28/2023 1104   HCT 31.7 (L) 07/28/2023 1104   PLT 140 (L) 07/28/2023 1104   MCV 91.4 07/28/2023 1104   MCH 30.0 07/28/2023 1104   MCHC 32.8 07/28/2023 1104   RDW 12.2 07/28/2023 1104   LYMPHSABS 0.4 (L) 07/28/2023 1104   MONOABS 0.2 07/28/2023 1104   EOSABS 0.1 07/28/2023 1104   BASOSABS 0.0 07/28/2023 1104    CMP     Component Value Date/Time   NA 141 07/28/2023 1104   K 3.6 07/28/2023 1104   CL 105 07/28/2023 1104   CO2 30 07/28/2023 1104   GLUCOSE 109 (H) 07/28/2023 1104   BUN 15 07/28/2023 1104   CREATININE 0.92 07/28/2023 1104   CALCIUM 9.5  07/28/2023 1104   PROT 7.7 07/28/2023 1104   ALBUMIN 4.2 07/28/2023 1104   AST 19 07/28/2023 1104   ALT 9 07/28/2023 1104   ALKPHOS 75 07/28/2023 1104   BILITOT 0.3 07/28/2023 1104   GFRNONAA >60 07/28/2023 1104   GFRAA >60 12/23/2015 1151     ASSESSMENT and THERAPY PLAN:   Malignant neoplasm of upper-outer quadrant of right female breast (HCC) Cancer Treatment Undergoing Keytruda and adjuvant radiation therapy. Reports skin breakdown and nausea. Noted downtrend in white blood cell count WBC 1.8, ANC 1.0--possibly related to [facility]. -Reviewed with Dr. Al Pimple and determined 5% of patients receiving Keytruda can develop leukopenia.  In absence of other symptoms (fevers, chills), patient will proceed with treatment and we will monitor WBC.  -Continue current treatment plan. -Continue radiation which will finish 08/07/2023  Anxiety Increased anxiety, possibly related to cancer diagnosis and treatment. Interfering with sleep and causing distress. -Encourage relaxation techniques and distraction methods. -Continue Pristiq as prescribed by psychiatry -Discuss with psychiatrist for possible medication adjustment.  Sleep Disturbance Difficulty sleeping due to anxiety and nightmares. -Encourage good sleep hygiene, including avoiding triggers before bedtime.  Left Arm Pain New onset, intermittent left arm pain. No significant swelling or other signs of port-associated blood clot. (Port-a-cath is in left chest wall).  -Order Doppler to rule out blood clot-if negative, will consider referral to cardiology -Order EKG to rule out cardiac issues, given left arm pain--EKG shows NSR  Hypothyroidism Last TSH elevated Patient taking Synthroid as prescribed Will recheck TSH and T4 in 3 weeks time.   Menopausal Symptoms Experiencing hot flashes, possibly related to cancer treatment and natural menopausal transition. -Continue monitoring symptoms. -Encourage regular exercise.  RTC in 3 weeks  for labs, f/u, and treatment   All questions were answered.  The patient knows to call the clinic with any problems, questions or concerns. We can certainly see the patient much sooner if necessary.  Total encounter time:40 minutes*in face-to-face visit time, chart review, lab review, care coordination, order entry, and documentation of the encounter time.    Lillard Anes, NP 07/28/23 1:06 PM Medical Oncology and Hematology [facility] [address] [place], [place] [address] Tel. [phone]    Fax. [phone]  *Total Encounter Time as defined by the Centers for Medicare and Medicaid Services includes, in addition to the face-to-face time of a patient visit (documented in the note above) non-face-to-face time: obtaining and reviewing outside history, ordering and reviewing medications, tests or procedures, care coordination (communications with other health care professionals or caregivers) and documentation in the medical record.

## 2023-07-28 NOTE — Patient Instructions (Signed)
[facility] - A DEPT OF [facility]  Discharge Instructions: Thank you for choosing [facility] to provide your oncology and hematology care.   If you have a lab appointment with the Cancer Center, please go directly to the Cancer Center and check in at the registration area.   Wear comfortable clothing and clothing appropriate for easy access to any Portacath or PICC line.   We strive to give you quality time with your provider. You may need to reschedule your appointment if you arrive late (15 or more minutes).  Arriving late affects you and other patients whose appointments are after yours.  Also, if you miss three or more appointments without notifying the office, you may be dismissed from the clinic at the provider's discretion.      For prescription refill requests, have your pharmacy contact our office and allow 72 hours for refills to be completed.    Today you received the following chemotherapy and/or immunotherapy agents: Pembrolizumab Rande Lawman)      To help prevent nausea and vomiting after your treatment, we encourage you to take your nausea medication as directed.  BELOW ARE SYMPTOMS THAT SHOULD BE REPORTED IMMEDIATELY: *FEVER GREATER THAN 100.4 F (38 C) OR HIGHER *CHILLS OR SWEATING *NAUSEA AND VOMITING THAT IS NOT CONTROLLED WITH YOUR NAUSEA MEDICATION *UNUSUAL SHORTNESS OF BREATH *UNUSUAL BRUISING OR BLEEDING *URINARY PROBLEMS (pain or burning when urinating, or frequent urination) *BOWEL PROBLEMS (unusual diarrhea, constipation, pain near the anus) TENDERNESS IN MOUTH AND THROAT WITH OR WITHOUT PRESENCE OF ULCERS (sore throat, sores in mouth, or a toothache) UNUSUAL RASH, SWELLING OR PAIN  UNUSUAL VAGINAL DISCHARGE OR ITCHING   Items with * indicate a potential emergency and should be followed up as soon as possible or go to the Emergency Department if any problems should occur.  Please show the CHEMOTHERAPY ALERT CARD  or IMMUNOTHERAPY ALERT CARD at check-in to the Emergency Department and triage nurse.  Should you have questions after your visit or need to cancel or reschedule your appointment, please contact [facility] - A DEPT OF [facility]  Dept: [phone]  and follow the prompts.  Office hours are 8:00 a.m. to 4:30 p.m. Monday - Friday. Please note that voicemails left after 4:00 p.m. may not be returned until the following business day.  We are closed weekends and major holidays. You have access to a nurse at all times for urgent questions. Please call the main number to the clinic Dept: [phone] and follow the prompts.   For any non-urgent questions, you may also contact your provider using MyChart. We now offer e-Visits for anyone 102 and older to request care online for non-urgent symptoms. For details visit [email or web address].   Also download the MyChart app! Go to the app store, search "MyChart", open the app, select [place], and log in with your MyChart username and password.

## 2023-07-29 ENCOUNTER — Ambulatory Visit
Admission: RE | Admit: 2023-07-29 | Discharge: 2023-07-29 | Disposition: A | Discharge status: Home / Self Care | Payer: Medicare HMO | Source: Ambulatory Visit | Attending: Radiation Oncology | Admitting: Radiation Oncology

## 2023-07-29 ENCOUNTER — Other Ambulatory Visit: Payer: Self-pay

## 2023-07-29 DIAGNOSIS — Z510 Encounter for antineoplastic radiation therapy: Secondary | ICD-10-CM | POA: Diagnosis not present

## 2023-07-29 DIAGNOSIS — Z5111 Encounter for antineoplastic chemotherapy: Secondary | ICD-10-CM | POA: Diagnosis not present

## 2023-07-29 DIAGNOSIS — C50411 Malignant neoplasm of upper-outer quadrant of right female breast: Secondary | ICD-10-CM | POA: Diagnosis not present

## 2023-07-29 DIAGNOSIS — Z79899 Other long term (current) drug therapy: Secondary | ICD-10-CM | POA: Diagnosis not present

## 2023-07-29 DIAGNOSIS — Z170 Estrogen receptor positive status [ER+]: Secondary | ICD-10-CM | POA: Diagnosis not present

## 2023-07-29 DIAGNOSIS — M79602 Pain in left arm: Secondary | ICD-10-CM | POA: Diagnosis not present

## 2023-07-29 LAB — RAD ONC ARIA SESSION SUMMARY
Course Elapsed Days: 23
Plan Fractions Treated to Date: 2
Plan Prescribed Dose Per Fraction: 2 Gy
Plan Total Fractions Prescribed: 4
Plan Total Prescribed Dose: 8 Gy
Reference Point Dosage Given to Date: 4 Gy
Reference Point Session Dosage Given: 2 Gy
Session Number: 18

## 2023-08-03 ENCOUNTER — Other Ambulatory Visit: Payer: Self-pay

## 2023-08-03 ENCOUNTER — Ambulatory Visit
Admission: RE | Admit: 2023-08-03 | Discharge: 2023-08-03 | Disposition: A | Discharge status: Home / Self Care | Payer: Medicare HMO | Source: Ambulatory Visit | Attending: Radiation Oncology | Admitting: Radiation Oncology

## 2023-08-03 ENCOUNTER — Ambulatory Visit: Payer: Medicare HMO

## 2023-08-03 DIAGNOSIS — Z510 Encounter for antineoplastic radiation therapy: Secondary | ICD-10-CM | POA: Insufficient documentation

## 2023-08-03 DIAGNOSIS — Z79899 Other long term (current) drug therapy: Secondary | ICD-10-CM | POA: Insufficient documentation

## 2023-08-03 DIAGNOSIS — M79602 Pain in left arm: Secondary | ICD-10-CM | POA: Diagnosis not present

## 2023-08-03 DIAGNOSIS — C50411 Malignant neoplasm of upper-outer quadrant of right female breast: Secondary | ICD-10-CM | POA: Insufficient documentation

## 2023-08-03 DIAGNOSIS — Z5111 Encounter for antineoplastic chemotherapy: Secondary | ICD-10-CM | POA: Diagnosis not present

## 2023-08-03 DIAGNOSIS — Z170 Estrogen receptor positive status [ER+]: Secondary | ICD-10-CM | POA: Insufficient documentation

## 2023-08-03 LAB — RAD ONC ARIA SESSION SUMMARY
Course Elapsed Days: 28
Plan Fractions Treated to Date: 3
Plan Prescribed Dose Per Fraction: 2 Gy
Plan Total Fractions Prescribed: 4
Plan Total Prescribed Dose: 8 Gy
Reference Point Dosage Given to Date: 6 Gy
Reference Point Session Dosage Given: 2 Gy
Session Number: 19

## 2023-08-04 ENCOUNTER — Ambulatory Visit
Admission: RE | Admit: 2023-08-04 | Discharge: 2023-08-04 | Disposition: A | Discharge status: Home / Self Care | Payer: Medicare HMO | Source: Ambulatory Visit | Attending: Radiation Oncology

## 2023-08-04 ENCOUNTER — Other Ambulatory Visit: Payer: Self-pay

## 2023-08-04 DIAGNOSIS — M79602 Pain in left arm: Secondary | ICD-10-CM | POA: Diagnosis not present

## 2023-08-04 DIAGNOSIS — Z5111 Encounter for antineoplastic chemotherapy: Secondary | ICD-10-CM | POA: Diagnosis not present

## 2023-08-04 DIAGNOSIS — Z79899 Other long term (current) drug therapy: Secondary | ICD-10-CM | POA: Diagnosis not present

## 2023-08-04 DIAGNOSIS — Z510 Encounter for antineoplastic radiation therapy: Secondary | ICD-10-CM | POA: Diagnosis not present

## 2023-08-04 DIAGNOSIS — Z170 Estrogen receptor positive status [ER+]: Secondary | ICD-10-CM | POA: Diagnosis not present

## 2023-08-04 DIAGNOSIS — C50411 Malignant neoplasm of upper-outer quadrant of right female breast: Secondary | ICD-10-CM | POA: Diagnosis not present

## 2023-08-04 LAB — RAD ONC ARIA SESSION SUMMARY
Course Elapsed Days: 29
Plan Fractions Treated to Date: 4
Plan Prescribed Dose Per Fraction: 2 Gy
Plan Total Fractions Prescribed: 4
Plan Total Prescribed Dose: 8 Gy
Reference Point Dosage Given to Date: 8 Gy
Reference Point Session Dosage Given: 2 Gy
Session Number: 20

## 2023-08-05 NOTE — Radiation Completion Notes (Addendum)
  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   Intent: Curative     ==========DELIVERED PLANS==========  First Treatment Date: 2023-07-06 Last Treatment Date: 2023-08-04   Plan Name: Breast_R Site: Breast, Right Technique: 3D Mode: Photon Dose Per Fraction: 2.66 Gy Prescribed Dose (Delivered / Prescribed): 42.56 Gy / 42.56 Gy Prescribed Fxs (Delivered / Prescribed): 16 / 16   Plan Name: Breast_R_Bst Site: Breast, Right Technique: 3D Mode: Photon Dose Per Fraction: 2 Gy Prescribed Dose (Delivered / Prescribed): 8 Gy / 8 Gy Prescribed Fxs (Delivered / Prescribed): 4 / 4     ==========ON TREATMENT VISIT DATES========== 2023-07-10, 2023-07-17, 2023-07-24, 2023-08-03      See weekly On Treatment Notes in Epic for details in the Media tab (listed as Progress notes on the On Treatment Visit Dates listed above).The patient tolerated radiation. She developed fatigue and anticipated skin changes in the treatment field.   The patient will receive a call in about one month from the radiation oncology department. She will continue follow up with Dr. Al Pimple as well.      Osker Mason, PAC

## 2023-08-07 ENCOUNTER — Other Ambulatory Visit: Payer: Self-pay

## 2023-08-10 ENCOUNTER — Encounter: Payer: Self-pay | Admitting: *Deleted

## 2023-08-11 ENCOUNTER — Encounter: Payer: Self-pay | Admitting: Adult Health

## 2023-08-11 ENCOUNTER — Ambulatory Visit: Payer: Medicare HMO

## 2023-08-11 ENCOUNTER — Other Ambulatory Visit: Payer: Medicare HMO

## 2023-08-11 ENCOUNTER — Ambulatory Visit: Payer: Medicare HMO | Admitting: Hematology and Oncology

## 2023-08-11 ENCOUNTER — Telehealth: Payer: Self-pay

## 2023-08-11 NOTE — Telephone Encounter (Signed)
Followed up with pt. Message about new lump in a different location.. Advise pt that Maria Carter would return her call back and explain any concerns that she has. Pt verbalized understanding and willing to wait til her aptt. To come in.

## 2023-08-17 ENCOUNTER — Other Ambulatory Visit: Payer: Self-pay | Admitting: Pharmacist

## 2023-08-17 ENCOUNTER — Inpatient Hospital Stay (HOSPITAL_BASED_OUTPATIENT_CLINIC_OR_DEPARTMENT_OTHER): Payer: Medicare HMO | Admitting: Hematology and Oncology

## 2023-08-17 ENCOUNTER — Encounter: Payer: Self-pay | Admitting: Hematology and Oncology

## 2023-08-17 ENCOUNTER — Telehealth: Payer: Self-pay | Admitting: *Deleted

## 2023-08-17 ENCOUNTER — Other Ambulatory Visit (HOSPITAL_COMMUNITY): Payer: Self-pay

## 2023-08-17 ENCOUNTER — Inpatient Hospital Stay: Payer: Medicare HMO

## 2023-08-17 ENCOUNTER — Telehealth: Payer: Self-pay

## 2023-08-17 ENCOUNTER — Inpatient Hospital Stay: Payer: Medicare HMO | Attending: Physician Assistant

## 2023-08-17 VITALS — BP 116/43 | HR 71 | Temp 97.7°F | Resp 16 | Wt 161.4 lb

## 2023-08-17 DIAGNOSIS — Z923 Personal history of irradiation: Secondary | ICD-10-CM | POA: Insufficient documentation

## 2023-08-17 DIAGNOSIS — C50411 Malignant neoplasm of upper-outer quadrant of right female breast: Secondary | ICD-10-CM | POA: Diagnosis not present

## 2023-08-17 DIAGNOSIS — Z5111 Encounter for antineoplastic chemotherapy: Secondary | ICD-10-CM | POA: Diagnosis not present

## 2023-08-17 DIAGNOSIS — Z79899 Other long term (current) drug therapy: Secondary | ICD-10-CM | POA: Diagnosis not present

## 2023-08-17 DIAGNOSIS — Z170 Estrogen receptor positive status [ER+]: Secondary | ICD-10-CM | POA: Diagnosis not present

## 2023-08-17 DIAGNOSIS — Z95828 Presence of other vascular implants and grafts: Secondary | ICD-10-CM

## 2023-08-17 LAB — CBC WITH DIFFERENTIAL (CANCER CENTER ONLY)
Abs Immature Granulocytes: 0 10*3/uL (ref 0.00–0.07)
Basophils Absolute: 0 10*3/uL (ref 0.0–0.1)
Basophils Relative: 1 %
Eosinophils Absolute: 0.1 10*3/uL (ref 0.0–0.5)
Eosinophils Relative: 3 %
HCT: 31.7 % — ABNORMAL LOW (ref 36.0–46.0)
Hemoglobin: 10.3 g/dL — ABNORMAL LOW (ref 12.0–15.0)
Immature Granulocytes: 0 %
Lymphocytes Relative: 23 %
Lymphs Abs: 0.4 10*3/uL — ABNORMAL LOW (ref 0.7–4.0)
MCH: 29.8 pg (ref 26.0–34.0)
MCHC: 32.5 g/dL (ref 30.0–36.0)
MCV: 91.6 fL (ref 80.0–100.0)
Monocytes Absolute: 0.2 10*3/uL (ref 0.1–1.0)
Monocytes Relative: 11 %
Neutro Abs: 1.2 10*3/uL — ABNORMAL LOW (ref 1.7–7.7)
Neutrophils Relative %: 62 %
Platelet Count: 141 10*3/uL — ABNORMAL LOW (ref 150–400)
RBC: 3.46 MIL/uL — ABNORMAL LOW (ref 3.87–5.11)
RDW: 13.6 % (ref 11.5–15.5)
WBC Count: 1.9 10*3/uL — ABNORMAL LOW (ref 4.0–10.5)
nRBC: 0 % (ref 0.0–0.2)

## 2023-08-17 LAB — CMP (CANCER CENTER ONLY)
ALT: 10 U/L (ref 0–44)
AST: 17 U/L (ref 15–41)
Albumin: 4.2 g/dL (ref 3.5–5.0)
Alkaline Phosphatase: 69 U/L (ref 38–126)
Anion gap: 3 — ABNORMAL LOW (ref 5–15)
BUN: 12 mg/dL (ref 6–20)
CO2: 32 mmol/L (ref 22–32)
Calcium: 9.5 mg/dL (ref 8.9–10.3)
Chloride: 104 mmol/L (ref 98–111)
Creatinine: 0.77 mg/dL (ref 0.44–1.00)
GFR, Estimated: >60 mL/min (ref >60)
Glucose, Bld: 113 mg/dL — ABNORMAL HIGH (ref 70–99)
Potassium: 3.9 mmol/L (ref 3.5–5.1)
Sodium: 139 mmol/L (ref 135–145)
Total Bilirubin: 0.4 mg/dL (ref <1.2)
Total Protein: 7.2 g/dL (ref 6.5–8.1)

## 2023-08-17 LAB — PREGNANCY, URINE: Preg Test, Ur: NEGATIVE

## 2023-08-17 MED ORDER — SODIUM CHLORIDE 0.9 % IV SOLN: Freq: Once | INTRAVENOUS | Status: AC

## 2023-08-17 MED ORDER — SODIUM CHLORIDE 0.9% FLUSH
10.0000 mL | INTRAVENOUS | Status: DC | PRN
  Administered 2023-08-17: 10 mL

## 2023-08-17 MED ORDER — SODIUM CHLORIDE 0.9% FLUSH
10.0000 mL | Freq: Once | INTRAVENOUS | Status: AC
Start: 2023-08-17 — End: 2023-08-17
  Administered 2023-08-17: 10 mL

## 2023-08-17 MED ORDER — CAPECITABINE 500 MG PO TABS
ORAL_TABLET | ORAL | 0 refills | Status: DC
  Filled 2023-08-31: qty 84, 21d supply, fill #0

## 2023-08-17 MED ORDER — HEPARIN SOD (PORK) LOCK FLUSH 100 UNIT/ML IV SOLN
500.0000 [IU] | Freq: Once | INTRAVENOUS | Status: AC | PRN
  Administered 2023-08-17: 500 [IU]

## 2023-08-17 MED ORDER — CAPECITABINE 500 MG PO TABS: 1000.0000 mg/m2 | ORAL_TABLET | Freq: Two times a day (BID) | ORAL | 5 refills | Status: DC

## 2023-08-17 MED ORDER — SODIUM CHLORIDE 0.9 % IV SOLN
200.0000 mg | Freq: Once | INTRAVENOUS | Status: AC
  Administered 2023-08-17: 200 mg via INTRAVENOUS
  Filled 2023-08-17: qty 200

## 2023-08-17 NOTE — Telephone Encounter (Signed)
May proceed with treatment today with ANC of 1.2 per MD.

## 2023-08-17 NOTE — Telephone Encounter (Signed)
Oral Oncology Patient Advocate Encounter  New authorization   Received notification that prior authorization for Capecitabine is required.   PA submitted on 08/17/23  Key [id number]  Status is pending     Ardeen Fillers, CPhT Oncology Pharmacy Patient Advocate  [facility]  [phone] (phone) [phone] (fax) 08/17/2023 2:24 PM

## 2023-08-17 NOTE — Assessment & Plan Note (Addendum)
Breast Cancer Patient recently completed radiation therapy and is currently on immunotherapy. Noted a new lump in the breast, likely related to radiation therapy. Experiencing fatigue. -Order ultrasound of the right breast to ensure safety and provide peace of mind. -Start Xeloda (oral chemotherapy) given lack of pCR. -Start 1500 mg PO BID given baseline neutropenia. -Start Xeloda on January 6th, 2025, to align with immunotherapy schedule. -Check blood work on January 6th, 2025, before starting Xeloda. -Advise patient to report any immediate and intense side effects after starting Xeloda. -Discussed about adverse effects including but not limited to fatigue, nausea, vomiting, diarrhea, hand-foot syndrome, coronary vasospasm, severe toxicity in patients with underlying DPD deficiency.  General Health Maintenance / Followup Plans -Continue immunotherapy as scheduled. -Follow-up appointment on January 6th, 2025, to start Xeloda and check blood work.

## 2023-08-17 NOTE — Patient Instructions (Signed)

## 2023-08-17 NOTE — Progress Notes (Signed)
[facility] Cancer Follow up:    [name], NP-C [address] [place] [address]   DIAGNOSIS:  Cancer Staging  Malignant neoplasm of upper-outer quadrant of right female breast [facility]) Staging form: Breast, AJCC 8th Edition - Clinical: Stage IIB (cT2, cN0, cM0, G3, ER+, PR-, HER2-) - Signed by [name], MD on 10/08/2022 Stage prefix: Initial diagnosis Histologic grading system: 3 grade system   SUMMARY OF ONCOLOGIC HISTORY: Oncology History  Malignant neoplasm of upper-outer quadrant of right female breast (HCC)  09/26/2022 Mammogram   Patient had a palpable right breast mass and hence underwent bilateral diagnostic mammogram.  This confirmed a suspicious 3 cm right upper outer quadrant mass and an abnormal right axillary lymph node with cortical thickening.  Tissue sampling of both the breast mass and axillary lymph node were recommended.  Left breast was normal.   09/26/2022 Breast US   Ultrasound breast confirmed the above-mentioned findings.   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  Prognostic showed ER 50% positive weak staining PR 0% negative HER2 1+ by IHC and Ki-67 of 70%   10/08/2022 Cancer Staging   Staging form: Breast, AJCC 8th Edition - Clinical: Stage IIB (cT2, cN0, cM0, G3, ER+, PR-, HER2-) - Signed by [name], MD on 10/08/2022 Stage prefix: Initial diagnosis Histologic grading system: 3 grade system   10/24/2022 - 11/14/2022 Chemotherapy   Patient is on Treatment Plan : BREAST Pembrolizumab (200) D1 + Carboplatin (5) D1 + Paclitaxel (80) D1,8,15 q21d X 4 cycles / Pembrolizumab (200) D1 + AC D1 q21d x 4 cycles      Genetic Testing   [facility] Common Cancer Panel+RNA was Negative. Report date is 10/16/2022.  The Common Hereditary Cancers Panel offered by [facility] includes sequencing and/or deletion duplication testing of the following 48 genes: APC, ATM, AXIN2, BAP1,  BARD1, BMPR1A, BRCA1, BRCA2, BRIP1, CDH1, CDK4, CDKN2A (p14ARF and p16INK4a only), CHEK2, CTNNA1, DICER1, EPCAM (Deletion/duplication testing only), FH, GREM1 (promoter region duplication testing only), HOXB13, KIT, MBD4, MEN1, MLH1, MSH2, MSH3, MSH6, MUTYH, NF1, NHTL1, PALB2, PDGFRA, PMS2, POLD1, POLE, PTEN, RAD51C, RAD51D, SDHA (sequencing analysis only except exon 14), SDHB, SDHC, SDHD, SMAD4, SMARCA4. STK11, TP53, TSC1, TSC2, and VHL.   11/21/2022 - 04/08/2023 Chemotherapy   Patient is on Treatment Plan : BREAST Pembrolizumab (200) D1 + Carboplatin (1.5) D1,8,15 + Paclitaxel (80) D1,8,15 q21d X 4 cycles / Pembrolizumab (200) D1 + AC D1 q21d x 4 cycles     05/19/2023 -  Chemotherapy   Patient is on Treatment Plan : BREAST Pembrolizumab (200) q21d x 27 weeks     05/21/2023 Surgery   Right lumpectomy: metaplastic IDC with chondroid differentiation, 1.8 cm, grade 3, margins negative, no LVI, 8 SLN negative, repeat prognostic panel: ER 40% positive, weak staining, PR 0% negative, Ki-67 80%, HER2 negative (0). RCB-II   07/06/2023 - 08/04/2023 Radiation Therapy   Adjuvant radiation therapy     CURRENT THERAPY: Keytruda/adjuvant radiation  INTERVAL HISTORY:  The patient, with a history of cancer and recent completion of radiation and immunotherapy, presents with a newly discovered lump in the breast area. The lump was noticed about five days ago during a shower. The patient reports that the area around the lump is painful. In addition to this, the patient has been experiencing fatigue and exhaustion.  The patient completed radiation therapy on the third of the current month and is currently receiving immunotherapy. The patient is  also due to start a new medication, Xeloda, which was discussed in a previous consultation. The patient has concerns about the side effects of this new medication, particularly its impact on sleep.  Discussed the use of AI scribe software for clinical note transcription with  the patient, who gave verbal consent to proceed.  Patient Active Problem List   Diagnosis Date Noted   Port-A-Cath in place 10/24/2022   Genetic testing 10/16/2022   Malignant neoplasm of upper-outer quadrant of right female breast (HCC) 10/06/2022   Thrombocytopenia (HCC) 03/05/2022   Leukopenia 03/05/2022   Hot flashes 02/28/2022   Hair thinning 02/28/2022   Amenorrhea 02/28/2022   Screening for colon cancer 02/28/2022   Multiple nevi 02/28/2022   PTSD (post-traumatic stress disorder) 02/08/2021   Mild episode of recurrent major depressive disorder (HCC) 09/12/2013   Attention deficit disorder 04/25/2013   Migraine 02/05/2013   KELOID SCAR 05/09/2010   URINALYSIS, ABNORMAL 05/09/2010   Anxiety state 03/01/2010   DEPRESSION 03/01/2010   Headache 03/01/2010   Intermittent chest pain 03/01/2010    has no known allergies.  MEDICAL HISTORY: Past Medical History:  Diagnosis Date   Anxiety    Breast cancer (HCC)    right breast IDC   Depression    Hx of febrile seizure    1980   Hypothyroidism     SURGICAL HISTORY: Past Surgical History:  Procedure Laterality Date   BREAST BIOPSY Right 09/30/2022   [place] RT BREAST BX W LOC DEV 1ST LESION IMG BX SPEC US GUIDE 09/30/2022 [facility]   BREAST BIOPSY  05/20/2023   MM RT RADIOACTIVE SEED LOC MAMMO GUIDE 05/20/2023 [facility]   BREAST LUMPECTOMY WITH RADIOACTIVE SEED AND SENTINEL LYMPH NODE BIOPSY Right 05/21/2023   Procedure: RIGHT BREAST LUMPECTOMY WITH RADIOACTIVE SEED AND SENTINEL LYMPH NODE BIOPSY;  Surgeon: [name], MD;  Location: [facility];  Service: General;  Laterality: Right;  LMA PEC BLOCK   EXCISION OF KELOID N/A 05/21/2023   Procedure: EXCISION CHEST WALL SCAR;  Surgeon: [name], MD;  Location: [facility];  Service: General;  Laterality: N/A;   PORTACATH PLACEMENT N/A 10/23/2022   Procedure: INSERTION PORT-A-CATH;  Surgeon: [name], MD;  Location:  [facility];  Service: General;  Laterality: N/A;    SOCIAL HISTORY: Social History   Socioeconomic History   Marital status: Single    Spouse name: Not on file   Number of children: Not on file   Years of education: Not on file   Highest education level: Not on file  Occupational History   Not on file  Tobacco Use   Smoking status: Never   Smokeless tobacco: Never  Vaping Use   Vaping status: Never Used  Substance and Sexual Activity   Alcohol use: Not Currently    Comment: social   Drug use: No   Sexual activity: Yes    Partners: Male    Birth control/protection: None  Other Topics Concern   Not on file  Social History Narrative   Not on file   Social Drivers of Health   Financial Resource Strain: High Risk (10/08/2022)   Overall Financial Resource Strain (CARDIA)    Difficulty of Paying Living Expenses: Hard  Food Insecurity: Food Insecurity Present (10/08/2022)   Hunger Vital Sign    Worried About Running Out of Food in the Last Year: Sometimes true    Ran Out of Food in the Last Year: Sometimes true  Transportation Needs: No  Transportation Needs (10/08/2022)   PRAPARE - Administrator, Civil Service (Medical): No    Lack of Transportation (Non-Medical): No  Physical Activity: Not on file  Stress: Not on file  Social Connections: Not on file  Intimate Partner Violence: Not on file    FAMILY HISTORY: Family History  Problem Relation Age of Onset   Breast cancer Sister 41 - 51   Cancer Paternal Grandmother        unknown type    Review of Systems  Constitutional:  Positive for fatigue. Negative for appetite change, chills, fever and unexpected weight change.  HENT:   Negative for hearing loss, lump/mass and trouble swallowing.   Eyes:  Negative for eye problems and icterus.  Respiratory:  Positive for shortness of breath (per interval history). Negative for chest tightness and cough.   Cardiovascular:  Negative for chest pain, leg  swelling and palpitations.  Gastrointestinal:  Negative for abdominal distention, abdominal pain, constipation, diarrhea, nausea and vomiting.  Endocrine: Negative for hot flashes.  Genitourinary:  Negative for difficulty urinating.   Musculoskeletal:  Negative for arthralgias.  Skin:  Negative for itching and rash.  Neurological:  Negative for dizziness, extremity weakness, headaches and numbness.  Hematological:  Negative for adenopathy. Does not bruise/bleed easily.  Psychiatric/Behavioral:  Negative for depression. The patient is nervous/anxious.       PHYSICAL EXAMINATION     Vitals:   08/17/23 1237  BP: (!) 116/43  Pulse: 71  Resp: 16  Temp: 97.7 F (36.5 C)  SpO2: 100%    Physical Exam Constitutional:      General: She is not in acute distress.    Appearance: Normal appearance. She is not toxic-appearing.  HENT:     Head: Normocephalic and atraumatic.     Mouth/Throat:     Mouth: Mucous membranes are moist.     Pharynx: Oropharynx is clear. No oropharyngeal exudate or posterior oropharyngeal erythema.  Eyes:     General: No scleral icterus. Cardiovascular:     Rate and Rhythm: Normal rate and regular rhythm.     Pulses: Normal pulses.     Heart sounds: Normal heart sounds.  Pulmonary:     Effort: Pulmonary effort is normal.     Breath sounds: Normal breath sounds.  Chest:     Comments: Right breast post rad changes.  Some lumpiness at 6 0 clock pos of right breast. Likely post rad changes. No regional adenopathy. Abdominal:     General: Abdomen is flat. Bowel sounds are normal. There is no distension.     Palpations: Abdomen is soft.     Tenderness: There is no abdominal tenderness.  Musculoskeletal:        General: No swelling.     Cervical back: Neck supple.  Lymphadenopathy:     Cervical: No cervical adenopathy.     Upper Body:     Left upper body: No supraclavicular or axillary adenopathy.  Skin:    General: Skin is warm and dry.     Findings: No  rash.  Neurological:     General: No focal deficit present.     Mental Status: She is alert.  Psychiatric:        Mood and Affect: Mood normal.        Behavior: Behavior normal.     LABORATORY DATA:  CBC    Component Value Date/Time   WBC 1.9 (L) 08/17/2023 1203   WBC 2.9 (L) 02/28/2022 1049   RBC 3.46 (  L) 08/17/2023 1203   HGB 10.3 (L) 08/17/2023 1203   HCT 31.7 (L) 08/17/2023 1203   PLT 141 (L) 08/17/2023 1203   MCV 91.6 08/17/2023 1203   MCH 29.8 08/17/2023 1203   MCHC 32.5 08/17/2023 1203   RDW 13.6 08/17/2023 1203   LYMPHSABS 0.4 (L) 08/17/2023 1203   MONOABS 0.2 08/17/2023 1203   EOSABS 0.1 08/17/2023 1203   BASOSABS 0.0 08/17/2023 1203    CMP     Component Value Date/Time   NA 139 08/17/2023 1203   K 3.9 08/17/2023 1203   CL 104 08/17/2023 1203   CO2 32 08/17/2023 1203   GLUCOSE 113 (H) 08/17/2023 1203   BUN 12 08/17/2023 1203   CREATININE 0.77 08/17/2023 1203   CALCIUM 9.5 08/17/2023 1203   PROT 7.2 08/17/2023 1203   ALBUMIN 4.2 08/17/2023 1203   AST 17 08/17/2023 1203   ALT 10 08/17/2023 1203   ALKPHOS 69 08/17/2023 1203   BILITOT 0.4 08/17/2023 1203   GFRNONAA >60 08/17/2023 1203   GFRAA >60 12/23/2015 1151     ASSESSMENT and THERAPY PLAN:   Malignant neoplasm of upper-outer quadrant of right female breast [facility]) Breast Cancer Patient recently completed radiation therapy and is currently on immunotherapy. Noted a new lump in the breast, likely related to radiation therapy. Experiencing fatigue. -Order ultrasound of the right breast to ensure safety and provide peace of mind. -Start Xeloda (oral chemotherapy) given lack of pCR. -Start 1500 mg PO BID given baseline neutropenia. -Start Xeloda on January 6th, 2025, to align with immunotherapy schedule. -Check blood work on January 6th, 2025, before starting Xeloda. -Advise patient to report any immediate and intense side effects after starting Xeloda. -Discussed about adverse effects including but  not limited to fatigue, nausea, vomiting, diarrhea, hand-foot syndrome, coronary vasospasm, severe toxicity in patients with underlying DPD deficiency.  General Health Maintenance / Followup Plans -Continue immunotherapy as scheduled. -Follow-up appointment on January 6th, 2025, to start Xeloda and check blood work.    All questions were answered. The patient knows to call the clinic with any problems, questions or concerns. We can certainly see the patient much sooner if necessary.  Total encounter time:30 minutes*in face-to-face visit time, chart review, lab review, care coordination, order entry, and documentation of the encounter time.  *Total Encounter Time as defined by the Centers for Medicare and Medicaid Services includes, in addition to the face-to-face time of a patient visit (documented in the note above) non-face-to-face time: obtaining and reviewing outside history, ordering and reviewing medications, tests or procedures, care coordination (communications with other health care professionals or caregivers) and documentation in the medical record.

## 2023-08-18 ENCOUNTER — Other Ambulatory Visit (HOSPITAL_COMMUNITY): Payer: Self-pay

## 2023-08-18 ENCOUNTER — Encounter: Payer: Self-pay | Admitting: Hematology and Oncology

## 2023-08-18 ENCOUNTER — Other Ambulatory Visit: Payer: Self-pay

## 2023-08-18 NOTE — Telephone Encounter (Signed)
Oral Oncology Patient Advocate Encounter  Prior Authorization for capecitabine has been approved under Medicare Part B  PA# [id number] Effective dates: 08/18/23 through 08/31/24  Patients co-pay is $0.    Jinger Neighbors, CPhT-Adv Oncology Pharmacy Patient Advocate [facility] Direct Number: [phone]  Fax: [phone]

## 2023-08-28 ENCOUNTER — Encounter: Payer: Self-pay | Admitting: *Deleted

## 2023-08-30 ENCOUNTER — Other Ambulatory Visit: Payer: Self-pay

## 2023-08-31 ENCOUNTER — Other Ambulatory Visit: Payer: Self-pay

## 2023-08-31 ENCOUNTER — Ambulatory Visit
Admission: RE | Admit: 2023-08-31 | Discharge: 2023-08-31 | Disposition: A | Discharge status: Home / Self Care | Payer: Medicare HMO | Source: Ambulatory Visit | Attending: Radiation Oncology | Admitting: Radiation Oncology

## 2023-08-31 ENCOUNTER — Other Ambulatory Visit: Payer: Self-pay | Admitting: Pharmacy Technician

## 2023-08-31 ENCOUNTER — Other Ambulatory Visit: Payer: Self-pay | Admitting: Hematology and Oncology

## 2023-08-31 NOTE — Progress Notes (Unsigned)
Specialty Pharmacy Initial Fill Coordination Note  Maria Carter is a 52 y.o. female contacted today regarding refills of specialty medication(s) Capecitabine (XELODA) .  Patient requested Maria Carter at [facility] [place]  on 09/03/23   Medication will be filled on 09/01/23.   Patient is aware of $ copayment.

## 2023-08-31 NOTE — Progress Notes (Signed)
  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 (as documented in provider EOT note)  The patient was available for call today.   Symptoms of fatigue have improved since completing therapy.  Symptoms of skin changes have improved since completing therapy.  The patient was encouraged to avoid sun exposure in the area of prior treatment for up to one year following radiation with either sunscreen or by the style of clothing worn in the sun.  The patient has scheduled follow up with her medical oncologist Dr. Al Pimple for ongoing surveillance, and was encouraged to call if she develops concerns or questions regarding radiation.   This concludes the interaction.  Ruel Favors, LPN

## 2023-09-01 ENCOUNTER — Other Ambulatory Visit: Payer: Self-pay

## 2023-09-02 ENCOUNTER — Other Ambulatory Visit: Payer: Self-pay

## 2023-09-03 ENCOUNTER — Encounter: Payer: Self-pay | Admitting: *Deleted

## 2023-09-03 ENCOUNTER — Other Ambulatory Visit (HOSPITAL_COMMUNITY): Payer: Self-pay

## 2023-09-03 ENCOUNTER — Encounter: Payer: Self-pay | Admitting: Hematology and Oncology

## 2023-09-03 ENCOUNTER — Inpatient Hospital Stay: Payer: Medicare HMO | Attending: Physician Assistant | Admitting: Pharmacist

## 2023-09-03 VITALS — BP 130/69 | HR 66 | Temp 98.5°F | Resp 19 | Wt 165.2 lb

## 2023-09-03 DIAGNOSIS — Z170 Estrogen receptor positive status [ER+]: Secondary | ICD-10-CM | POA: Diagnosis not present

## 2023-09-03 DIAGNOSIS — Z79899 Other long term (current) drug therapy: Secondary | ICD-10-CM | POA: Diagnosis not present

## 2023-09-03 DIAGNOSIS — R112 Nausea with vomiting, unspecified: Secondary | ICD-10-CM | POA: Insufficient documentation

## 2023-09-03 DIAGNOSIS — B349 Viral infection, unspecified: Secondary | ICD-10-CM | POA: Diagnosis not present

## 2023-09-03 DIAGNOSIS — C50411 Malignant neoplasm of upper-outer quadrant of right female breast: Secondary | ICD-10-CM | POA: Diagnosis not present

## 2023-09-03 DIAGNOSIS — N390 Urinary tract infection, site not specified: Secondary | ICD-10-CM | POA: Insufficient documentation

## 2023-09-03 DIAGNOSIS — Z5111 Encounter for antineoplastic chemotherapy: Secondary | ICD-10-CM | POA: Insufficient documentation

## 2023-09-03 MED ORDER — DICLOFENAC SODIUM 1 % EX GEL: CUTANEOUS | 0 refills | Status: DC

## 2023-09-03 MED ORDER — ONDANSETRON HCL 8 MG PO TABS: 8.0000 mg | ORAL_TABLET | Freq: Three times a day (TID) | ORAL | 2 refills | Status: DC | PRN

## 2023-09-03 MED ORDER — PROCHLORPERAZINE MALEATE 10 MG PO TABS: 10.0000 mg | ORAL_TABLET | Freq: Four times a day (QID) | ORAL | 0 refills | Status: DC | PRN

## 2023-09-03 NOTE — Progress Notes (Signed)
 [facility]       Telephone: [phone]?Fax: [phone]   Oncology Clinical Pharmacist Practitioner Initial Assessment  Maria Carter is a 53 y.o. female with a diagnosis of breast cancer. They were contacted today via in-person visit.  Indication/Regimen Capecitabine  (Xeloda ) is being used appropriately for treatment of breast cancer in the adjuvant setting by Dr. Amber Stalls.      Wt Readings from Last 1 Encounters:  08/17/23 161 lb 6.4 oz (73.2 kg)    Estimated body surface area is 1.77 meters squared as calculated from the following:   Height as of 06/11/23: 5' 1 (1.549 m).   Weight as of 08/17/23: 161 lb 6.4 oz (73.2 kg).  The dosing regimen is 3 tablets (1500 mg) by mouth in the morning and 3 tablets (1500 mg) in the evening on days 1 to 14 of a 21-day cycle. . This is being given  in combination with adjuvant pembrolizumab  . It is planned to continue until  6 months per the Create-X trial data . Prescription dose and frequency assessed for appropriateness.  Patient has agreed to treatment which is documented in physician note on 08/17/23. Counseled patient on administration, dosing, side effects, monitoring, drug-food interactions, safe handling, storage, and disposal.  Dose Modifications Capecitabine  dose is estimated at 847 mg/m2 BID 14 out of 21 days. Per Dr. Stalls, reduced due to baseline neutropenia.  Access Assessment Maria Carter will be receiving capecitabine  through [facility] Concerns: none Start date if known: Dr. Stalls would like her to start on 09/07/23 to align with next pembrolizumab  administration  Adherence Assessment Reviewed importance on keeping a med schedule and plan for any missed doses Barriers to adherence identified? No  Allergies No Known Allergies  Vitals    08/17/2023   12:37 PM 07/28/2023    1:55 PM 07/28/2023    1:00 PM  Oncology Vitals  Weight 73.211 kg    Weight (lbs)  161 lbs 6 oz    BMI 30.5 kg/m2    Temp 97.7 F (36.5 C)  98.8 F (37.1 C)  Pulse Rate 71 64 65  BP 116/43 126/72 121/79  Resp 16 16 17   SpO2 100 % 100 % 100 %  BSA (m2) 1.77 m2       Laboratory Data    Latest Ref Rng & Units 08/17/2023   12:03 PM 07/28/2023   11:04 AM 07/06/2023   12:39 PM  CBC EXTENDED  WBC 4.0 - 10.5 K/uL 1.9  1.8  2.5   RBC 3.87 - 5.11 MIL/uL 3.46  3.47  3.77   Hemoglobin 12.0 - 15.0 g/dL 89.6  89.5  88.6   HCT 36.0 - 46.0 % 31.7  31.7  34.2   Platelets 150 - 400 K/uL 141  140  154   NEUT# 1.7 - 7.7 K/uL 1.2  1.0  1.3   Lymph# 0.7 - 4.0 K/uL 0.4  0.4  0.8        Latest Ref Rng & Units 08/17/2023   12:03 PM 07/28/2023   11:04 AM 07/06/2023   12:39 PM  CMP  Glucose 70 - 99 mg/dL 886  890  95   BUN 6 - 20 mg/dL 12  15  13    Creatinine 0.44 - 1.00 mg/dL 9.22  9.07  9.04   Sodium 135 - 145 mmol/L 139  141  137   Potassium 3.5 - 5.1 mmol/L 3.9  3.6  3.8   Chloride 98 -  111 mmol/L 104  105  103   CO2 22 - 32 mmol/L 32  30  30   Calcium 8.9 - 10.3 mg/dL 9.5  9.5  9.6   Total Protein 6.5 - 8.1 g/dL 7.2  7.7  7.7   Total Bilirubin <1.2 mg/dL 0.4  0.3  0.4   Alkaline Phos 38 - 126 U/L 69  75  85   AST 15 - 41 U/L 17  19  21    ALT 0 - 44 U/L 10  9  11     Contraindications Contraindications were reviewed? Yes Contraindications to therapy were identified? No   Safety Precautions The following safety precautions for the use of capecitabine  were reviewed:  Fever: reviewed the importance of having a thermometer and the Centers for Disease Control and Prevention (CDC) definition of fever which is 100.20F (38C) or higher. Patient should call 24/7 triage at [phone] if experiencing a fever or any other symptoms Decreased white blood cells (WBCs) and increased risk for infection Decreased hemoglobin, part of the red blood cells that carry iron and oxygen Diarrhea Mouth irritation or sores Pain or discomfort in hands and/or feet Changes in liver  function Nausea or vomiting Fatigue Abdominal pain Decreased platelet count and increased risk of bleeding Interactions with warfarin Cardiotoxicity Kidney injury Dehydration Alopecia Handling body fluids and waste Pregnancy, sexual activity, and contraception Storage and Handling To be taken within 30 minutes of a meal Missed doses  Medication Reconciliation Current Outpatient Medications  Medication Sig Dispense Refill   B Complex Vitamins (B COMPLEX PO) Take by mouth.     busPIRone (BUSPAR) 15 MG tablet Take by mouth.     capecitabine  (XELODA ) 500 MG tablet Take 3 tabs (1500 mg) in AM and 3 tabs (1500 mg) in PM 14 days on, followed by a 7 day rest period (21 day supply). Take within 30 minutes of a meal, 12 hours apart. 84 tablet 0   cholecalciferol (VITAMIN D3) 25 MCG (1000 UNIT) tablet Take 1,000 Units by mouth daily.     desvenlafaxine (PRISTIQ) 100 MG 24 hr tablet Take 100 mg by mouth daily.     dexamethasone  (DECADRON ) 4 MG tablet Take 1 tab PO BID with food x2 days after treatment 30 tablet 0   gabapentin  (NEURONTIN ) 300 MG capsule Take 1 capsule (300 mg total) by mouth at bedtime. 30 capsule 0   levothyroxine  (SYNTHROID ) 25 MCG tablet Take 2 tablets (50 mcg total) by mouth daily before breakfast. Dose being titrated 60 tablet 3   Multiple Vitamins-Minerals (MULTIVITAMIN ADULTS PO) Take 1 tablet by mouth daily.     oxyCODONE  (OXY IR/ROXICODONE ) 5 MG immediate release tablet Take 1 tablet (5 mg total) by mouth every 6 (six) hours as needed for moderate pain or severe pain. 25 tablet 0   prochlorperazine  (COMPAZINE ) 10 MG tablet Take 1 tablet (10 mg total) by mouth every 6 (six) hours as needed for nausea or vomiting. 30 tablet 0   traMADol  (ULTRAM ) 50 MG tablet Take 1 tablet (50 mg total) by mouth every 6 (six) hours as needed for moderate pain or severe pain. 25 tablet 0   triamcinolone  (KENALOG ) 0.025 % ointment Apply 1 Application topically 2 (two) times daily. Use for 7  days. Do not apply to face. 80 g 0   vitamin B-12 (CYANOCOBALAMIN ) 100 MCG tablet Take 100 mcg by mouth daily.     No current facility-administered medications for this visit.    Medication reconciliation is based on  the patient's most recent medication list in the electronic medical record (EMR) including herbal products and OTC medications.   The patient's medication list was reviewed today with the patient? Yes   Drug-drug interactions (DDIs) DDIs were evaluated? Yes Significant DDIs identified? No   Drug-Food Interactions Drug-food interactions were evaluated? Yes Drug-food interactions identified? No   Follow-up Plan  Patient education handout given to patient Start capecitabine  1500 mg by mouth every 12 hours 14 days on, followed by a 7 day rest period. Start on 09/07/23 per Dr. Loretha. Capecitabine  picked up [facility] at [facility] Continue pembrolizumab  200 mg IV every 3 weeks. Next due 09/07/23 Consider using diclofenac  gel topically for prevention of hand foot syndrome per the D-TORCH trial data Next visit is port flush with labs, Morna Kendall NP visit, and pembrolizumab  on 09/07/23 Patient can follow up with clinical pharmacy as needed going forward  Maria Carter participated in the discussion, expressed understanding, and voiced agreement with the above plan. All questions were answered to her satisfaction. The patient was advised to contact the clinic at [phone] with any questions or concerns prior to her return visit.   I spent 60 minutes assessing the patient.  Arminta Gamm A. Carter, PharmD, BCOP, CPP  Maria Carter, RPH-CPP, 09/03/2023 2:38 PM  **Disclaimer: This note was dictated with voice recognition software. Similar sounding words can inadvertently be transcribed and this note may contain transcription errors which may not have been corrected upon publication of note.**

## 2023-09-04 NOTE — Progress Notes (Deleted)
 [facility] Cancer Follow up:    Maria Boby CROME, NP-C [address] [place] [address]   DIAGNOSIS: Cancer Staging  Malignant neoplasm of upper-outer quadrant of right female breast [facility]) Staging form: Breast, AJCC 8th Edition - Clinical: Stage IIB (cT2, cN0, cM0, G3, ER+, PR-, HER2-) - Signed by [name], MD on 10/08/2022 Stage prefix: Initial diagnosis Histologic grading system: 3 grade system  I connected with [name] on 09/04/23 at  1:20 PM EST by telephone and verified that I am speaking with the correct person using two identifiers.  I discussed the limitations, risks, security and privacy concerns of performing an evaluation and management service by telephone and the availability of in person appointments.  I also discussed with the patient that there may be a patient responsible charge related to this service. The patient expressed understanding and agreed to proceed.  Patient location: home Provider location: [facility] office  SUMMARY OF ONCOLOGIC HISTORY: Oncology History  Malignant neoplasm of upper-outer quadrant of right female breast (HCC)  09/26/2022 Mammogram   Patient had a palpable right breast mass and hence underwent bilateral diagnostic mammogram.  This confirmed a suspicious 3 cm right upper outer quadrant mass and an abnormal right axillary lymph node with cortical thickening.  Tissue sampling of both the breast mass and axillary lymph node were recommended.  Left breast was normal.   09/26/2022 Breast US    Ultrasound breast confirmed the above-mentioned findings.   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  Prognostic showed ER 50% positive weak staining PR 0% negative HER2 1+ by IHC and Ki-67 of 70%   10/08/2022 Cancer Staging   Staging form: Breast, AJCC 8th Edition - Clinical: Stage IIB (cT2, cN0, cM0, G3, ER+, PR-, HER2-) - Signed by [name], MD on  10/08/2022 Stage prefix: Initial diagnosis Histologic grading system: 3 grade system   10/24/2022 - 11/14/2022 Chemotherapy   Patient is on Treatment Plan : BREAST Pembrolizumab  (200) D1 + Carboplatin  (5) D1 + Paclitaxel  (80) D1,8,15 q21d X 4 cycles / Pembrolizumab  (200) D1 + AC D1 q21d x 4 cycles      Genetic Testing   [employer] Common Cancer Panel+RNA was Negative. Report date is 10/16/2022.  The Common Hereditary Cancers Panel offered by [employer] includes sequencing and/or deletion duplication testing of the following 48 genes: APC, ATM, AXIN2, BAP1, BARD1, BMPR1A, BRCA1, BRCA2, BRIP1, CDH1, CDK4, CDKN2A (p14ARF and p16INK4a only), CHEK2, CTNNA1, DICER1, EPCAM (Deletion/duplication testing only), FH, GREM1 (promoter region duplication testing only), HOXB13, KIT, MBD4, MEN1, MLH1, MSH2, MSH3, MSH6, MUTYH, NF1, NHTL1, PALB2, PDGFRA, PMS2, POLD1, POLE, PTEN, RAD51C, RAD51D, SDHA (sequencing analysis only except exon 14), SDHB, SDHC, SDHD, SMAD4, SMARCA4. STK11, TP53, TSC1, TSC2, and VHL.   11/21/2022 - 04/08/2023 Chemotherapy   Patient is on Treatment Plan : BREAST Pembrolizumab  (200) D1 + Carboplatin  (1.5) D1,8,15 + Paclitaxel  (80) D1,8,15 q21d X 4 cycles / Pembrolizumab  (200) D1 + AC D1 q21d x 4 cycles     05/19/2023 -  Chemotherapy   Patient is on Treatment Plan : BREAST Pembrolizumab  (200) q21d x 27 weeks     05/21/2023 Surgery   Right lumpectomy: metaplastic IDC with chondroid differentiation, 1.8 cm, grade 3, margins negative, no LVI, 8 SLN negative, repeat prognostic panel: ER 40% positive, weak staining, PR 0% negative, Ki-67 80%, HER2 negative (0). RCB-II   07/06/2023 - 08/04/2023 Radiation Therapy   Adjuvant radiation therapy  CURRENT THERAPY:  INTERVAL HISTORY: [place] 53 y.o. female returns for    Patient Active Problem List   Diagnosis Date Noted   Port-A-Cath in place 10/24/2022   Genetic testing 10/16/2022   Malignant neoplasm of upper-outer quadrant of right female  breast (HCC) 10/06/2022   Thrombocytopenia (HCC) 03/05/2022   Leukopenia 03/05/2022   Hot flashes 02/28/2022   Hair thinning 02/28/2022   Amenorrhea 02/28/2022   Screening for colon cancer 02/28/2022   Multiple nevi 02/28/2022   PTSD (post-traumatic stress disorder) 02/08/2021   Mild episode of recurrent major depressive disorder (HCC) 09/12/2013   Attention deficit disorder 04/25/2013   Migraine 02/05/2013   KELOID SCAR 05/09/2010   URINALYSIS, ABNORMAL 05/09/2010   Anxiety state 03/01/2010   DEPRESSION 03/01/2010   Headache 03/01/2010   Intermittent chest pain 03/01/2010    has no known allergies.  MEDICAL HISTORY: Past Medical History:  Diagnosis Date   Anxiety    Breast cancer (HCC)    right breast IDC   Depression    Hx of febrile seizure    1980   Hypothyroidism     SURGICAL HISTORY: Past Surgical History:  Procedure Laterality Date   BREAST BIOPSY Right 09/30/2022   US  RT BREAST BX W LOC DEV 1ST LESION IMG BX SPEC US  GUIDE 09/30/2022 [facility]   BREAST BIOPSY  05/20/2023   MM RT RADIOACTIVE SEED LOC MAMMO GUIDE 05/20/2023 [facility]MO[facility]   BREAST LUMPECTOMY WITH RADIOACTIVE SEED AND SENTINEL LYMPH NODE BIOPSY Right 05/21/2023   Procedure: RIGHT BREAST LUMPECTOMY WITH RADIOACTIVE SEED AND SENTINEL LYMPH NODE BIOPSY;  Surgeon: Maria Berg, MD;  Location: [facility];  Service: General;  Laterality: Right;  LMA PEC BLOCK   EXCISION OF KELOID N/A 05/21/2023   Procedure: EXCISION CHEST WALL SCAR;  Surgeon: Maria Berg, MD;  Location: [facility];  Service: General;  Laterality: N/A;   PORTACATH PLACEMENT N/A 10/23/2022   Procedure: INSERTION PORT-A-CATH;  Surgeon: Maria Berg, MD;  Location: [facility];  Service: General;  Laterality: N/A;    SOCIAL HISTORY: Social History   Socioeconomic History   Marital status: Single    Spouse name: Not on file   Number of children: Not on file   Years of  education: Not on file   Highest education level: Not on file  Occupational History   Not on file  Tobacco Use   Smoking status: Never   Smokeless tobacco: Never  Vaping Use   Vaping status: Never Used  Substance and Sexual Activity   Alcohol use: Not Currently    Comment: social   Drug use: No   Sexual activity: Yes    Partners: Male    Birth control/protection: None  Other Topics Concern   Not on file  Social History Narrative   Not on file   Social Drivers of Health   Financial Resource Strain: High Risk (10/08/2022)   Overall Financial Resource Strain (CARDIA)    Difficulty of Paying Living Expenses: Hard  Food Insecurity: Food Insecurity Present (10/08/2022)   Hunger Vital Sign    Worried About Running Out of Food in the Last Year: Sometimes true    Ran Out of Food in the Last Year: Sometimes true  Transportation Needs: No Transportation Needs (10/08/2022)   PRAPARE - Administrator, Civil Service (Medical): No    Lack of Transportation (Non-Medical): No  Physical Activity: Not on file  Stress: Not on file  Social Connections:  Not on file  Intimate Partner Violence: Not on file    FAMILY HISTORY: Family History  Problem Relation Age of Onset   Breast cancer Sister 20 - 33   Cancer Paternal Grandmother        unknown type    Review of Systems  Constitutional:  Negative for appetite change, chills, fatigue, fever and unexpected weight change.  HENT:   Negative for hearing loss, lump/mass and trouble swallowing.   Eyes:  Negative for eye problems and icterus.  Respiratory:  Negative for chest tightness, cough and shortness of breath.   Cardiovascular:  Negative for chest pain, leg swelling and palpitations.  Gastrointestinal:  Negative for abdominal distention, abdominal pain, constipation, diarrhea, nausea and vomiting.  Endocrine: Negative for hot flashes.  Genitourinary:  Negative for difficulty urinating.   Musculoskeletal:  Negative for  arthralgias.  Skin:  Negative for itching and rash.  Neurological:  Negative for dizziness, extremity weakness, headaches and numbness.  Hematological:  Negative for adenopathy. Does not bruise/bleed easily.  Psychiatric/Behavioral:  Negative for depression. The patient is not nervous/anxious.       PHYSICAL EXAMINATION    There were no vitals filed for this visit.  Physical Exam Constitutional:      General: She is not in acute distress.    Appearance: Normal appearance. She is not toxic-appearing.  HENT:     Head: Normocephalic and atraumatic.     Mouth/Throat:     Mouth: Mucous membranes are moist.     Pharynx: Oropharynx is clear. No oropharyngeal exudate or posterior oropharyngeal erythema.  Eyes:     General: No scleral icterus. Cardiovascular:     Rate and Rhythm: Normal rate and regular rhythm.     Pulses: Normal pulses.     Heart sounds: Normal heart sounds.  Pulmonary:     Effort: Pulmonary effort is normal.     Breath sounds: Normal breath sounds.  Abdominal:     General: Abdomen is flat. Bowel sounds are normal. There is no distension.     Palpations: Abdomen is soft.     Tenderness: There is no abdominal tenderness.  Musculoskeletal:        General: No swelling.     Cervical back: Neck supple.  Lymphadenopathy:     Cervical: No cervical adenopathy.  Skin:    General: Skin is warm and dry.     Findings: No rash.  Neurological:     General: No focal deficit present.     Mental Status: She is alert.  Psychiatric:        Mood and Affect: Mood normal.        Behavior: Behavior normal.     LABORATORY DATA:  CBC    Component Value Date/Time   WBC 1.9 (L) 08/17/2023 1203   WBC 2.9 (L) 02/28/2022 1049   RBC 3.46 (L) 08/17/2023 1203   HGB 10.3 (L) 08/17/2023 1203   HCT 31.7 (L) 08/17/2023 1203   PLT 141 (L) 08/17/2023 1203   MCV 91.6 08/17/2023 1203   MCH 29.8 08/17/2023 1203   MCHC 32.5 08/17/2023 1203   RDW 13.6 08/17/2023 1203   LYMPHSABS 0.4  (L) 08/17/2023 1203   MONOABS 0.2 08/17/2023 1203   EOSABS 0.1 08/17/2023 1203   BASOSABS 0.0 08/17/2023 1203    CMP     Component Value Date/Time   NA 139 08/17/2023 1203   K 3.9 08/17/2023 1203   CL 104 08/17/2023 1203   CO2 32 08/17/2023 1203  GLUCOSE 113 (H) 08/17/2023 1203   BUN 12 08/17/2023 1203   CREATININE 0.77 08/17/2023 1203   CALCIUM 9.5 08/17/2023 1203   PROT 7.2 08/17/2023 1203   ALBUMIN 4.2 08/17/2023 1203   AST 17 08/17/2023 1203   ALT 10 08/17/2023 1203   ALKPHOS 69 08/17/2023 1203   BILITOT 0.4 08/17/2023 1203   GFRNONAA >60 08/17/2023 1203   GFRAA >60 12/23/2015 1151        ASSESSMENT and THERAPY PLAN:   No problem-specific Assessment & Plan notes found for this encounter.   All questions were answered. The patient knows to call the clinic with any problems, questions or concerns. We can certainly see the patient much sooner if necessary.  Total encounter time:*** minutes*in face-to-face visit time, chart review, lab review, care coordination, order entry, and documentation of the encounter time.   Morna Kendall, NP 09/04/23 5:16 PM Medical Oncology and Hematology [facility] [address] [place], [place] [address] Tel. [phone]    Fax. [phone]  *Total Encounter Time as defined by the Centers for Medicare and Medicaid Services includes, in addition to the face-to-face time of a patient visit (documented in the note above) non-face-to-face time: obtaining and reviewing outside history, ordering and reviewing medications, tests or procedures, care coordination (communications with other health care professionals or caregivers) and documentation in the medical record.

## 2023-09-06 ENCOUNTER — Other Ambulatory Visit: Payer: Self-pay

## 2023-09-07 ENCOUNTER — Inpatient Hospital Stay: Payer: Medicare HMO | Admitting: Adult Health

## 2023-09-07 ENCOUNTER — Inpatient Hospital Stay: Payer: Medicare HMO

## 2023-09-07 ENCOUNTER — Telehealth: Payer: Self-pay | Admitting: Hematology and Oncology

## 2023-09-07 ENCOUNTER — Other Ambulatory Visit: Payer: Self-pay | Admitting: Hematology and Oncology

## 2023-09-07 DIAGNOSIS — R7989 Other specified abnormal findings of blood chemistry: Secondary | ICD-10-CM

## 2023-09-07 DIAGNOSIS — C50411 Malignant neoplasm of upper-outer quadrant of right female breast: Secondary | ICD-10-CM

## 2023-09-07 NOTE — Telephone Encounter (Signed)
 Spoke with patient confirming upcoming appointment

## 2023-09-09 ENCOUNTER — Inpatient Hospital Stay: Payer: Medicare HMO

## 2023-09-09 ENCOUNTER — Encounter: Payer: Self-pay | Admitting: Hematology and Oncology

## 2023-09-09 ENCOUNTER — Inpatient Hospital Stay (HOSPITAL_BASED_OUTPATIENT_CLINIC_OR_DEPARTMENT_OTHER): Payer: Medicare HMO | Admitting: Hematology and Oncology

## 2023-09-09 ENCOUNTER — Other Ambulatory Visit: Payer: Self-pay

## 2023-09-09 VITALS — BP 101/60 | HR 65 | Temp 98.0°F | Resp 18 | Wt 165.7 lb

## 2023-09-09 DIAGNOSIS — Z79899 Other long term (current) drug therapy: Secondary | ICD-10-CM | POA: Diagnosis not present

## 2023-09-09 DIAGNOSIS — C50411 Malignant neoplasm of upper-outer quadrant of right female breast: Secondary | ICD-10-CM

## 2023-09-09 DIAGNOSIS — Z95828 Presence of other vascular implants and grafts: Secondary | ICD-10-CM

## 2023-09-09 DIAGNOSIS — B349 Viral infection, unspecified: Secondary | ICD-10-CM | POA: Diagnosis not present

## 2023-09-09 DIAGNOSIS — R112 Nausea with vomiting, unspecified: Secondary | ICD-10-CM | POA: Diagnosis not present

## 2023-09-09 DIAGNOSIS — N390 Urinary tract infection, site not specified: Secondary | ICD-10-CM | POA: Diagnosis not present

## 2023-09-09 DIAGNOSIS — Z170 Estrogen receptor positive status [ER+]: Secondary | ICD-10-CM | POA: Diagnosis not present

## 2023-09-09 DIAGNOSIS — Z5111 Encounter for antineoplastic chemotherapy: Secondary | ICD-10-CM | POA: Diagnosis not present

## 2023-09-09 LAB — CMP (CANCER CENTER ONLY)
ALT: 10 U/L (ref 0–44)
AST: 18 U/L (ref 15–41)
Albumin: 4.3 g/dL (ref 3.5–5.0)
Alkaline Phosphatase: 84 U/L (ref 38–126)
Anion gap: 4 — ABNORMAL LOW (ref 5–15)
BUN: 16 mg/dL (ref 6–20)
CO2: 32 mmol/L (ref 22–32)
Calcium: 9.9 mg/dL (ref 8.9–10.3)
Chloride: 102 mmol/L (ref 98–111)
Creatinine: 0.91 mg/dL (ref 0.44–1.00)
GFR, Estimated: >60 mL/min (ref >60)
Glucose, Bld: 90 mg/dL (ref 70–99)
Potassium: 3.8 mmol/L (ref 3.5–5.1)
Sodium: 138 mmol/L (ref 135–145)
Total Bilirubin: 0.4 mg/dL (ref 0.0–1.2)
Total Protein: 7.5 g/dL (ref 6.5–8.1)

## 2023-09-09 LAB — CBC WITH DIFFERENTIAL (CANCER CENTER ONLY)
Abs Immature Granulocytes: 0 10*3/uL (ref 0.00–0.07)
Basophils Absolute: 0 10*3/uL (ref 0.0–0.1)
Basophils Relative: 0 %
Eosinophils Absolute: 0 10*3/uL (ref 0.0–0.5)
Eosinophils Relative: 2 %
HCT: 32.5 % — ABNORMAL LOW (ref 36.0–46.0)
Hemoglobin: 10.7 g/dL — ABNORMAL LOW (ref 12.0–15.0)
Immature Granulocytes: 0 %
Lymphocytes Relative: 20 %
Lymphs Abs: 0.5 10*3/uL — ABNORMAL LOW (ref 0.7–4.0)
MCH: 29.7 pg (ref 26.0–34.0)
MCHC: 32.9 g/dL (ref 30.0–36.0)
MCV: 90.3 fL (ref 80.0–100.0)
Monocytes Absolute: 0.3 10*3/uL (ref 0.1–1.0)
Monocytes Relative: 12 %
Neutro Abs: 1.6 10*3/uL — ABNORMAL LOW (ref 1.7–7.7)
Neutrophils Relative %: 66 %
Platelet Count: 153 10*3/uL (ref 150–400)
RBC: 3.6 MIL/uL — ABNORMAL LOW (ref 3.87–5.11)
RDW: 14.4 % (ref 11.5–15.5)
WBC Count: 2.4 10*3/uL — ABNORMAL LOW (ref 4.0–10.5)
nRBC: 0 % (ref 0.0–0.2)

## 2023-09-09 LAB — TSH: TSH: 77.611 u[IU]/mL — ABNORMAL HIGH (ref 0.350–4.500)

## 2023-09-09 MED ORDER — SODIUM CHLORIDE 0.9% FLUSH
10.0000 mL | INTRAVENOUS | Status: DC | PRN
  Administered 2023-09-09: 10 mL

## 2023-09-09 MED ORDER — SODIUM CHLORIDE 0.9 % IV SOLN: Freq: Once | INTRAVENOUS | Status: AC

## 2023-09-09 MED ORDER — PEMBROLIZUMAB CHEMO INJECTION 100 MG/4ML
200.0000 mg | Freq: Once | INTRAVENOUS | Status: AC
  Administered 2023-09-09: 200 mg via INTRAVENOUS
  Filled 2023-09-09: qty 200

## 2023-09-09 MED ORDER — HEPARIN SOD (PORK) LOCK FLUSH 100 UNIT/ML IV SOLN
500.0000 [IU] | Freq: Once | INTRAVENOUS | Status: AC | PRN
  Administered 2023-09-09: 500 [IU]

## 2023-09-09 MED ORDER — SODIUM CHLORIDE 0.9% FLUSH
10.0000 mL | Freq: Once | INTRAVENOUS | Status: AC
  Administered 2023-09-09: 10 mL

## 2023-09-09 NOTE — Patient Instructions (Signed)

## 2023-09-09 NOTE — Progress Notes (Signed)
 [facility] Cancer Follow up:    Maria Boby CROME, NP-C [address] [place] [address]   DIAGNOSIS:  Cancer Staging  Malignant neoplasm of upper-outer quadrant of right female breast [facility]) Staging form: Breast, AJCC 8th Edition - Clinical: Stage IIB (cT2, cN0, cM0, G3, ER+, PR-, HER2-) - Signed by [name], MD on 10/08/2022 Stage prefix: Initial diagnosis Histologic grading system: 3 grade system    SUMMARY OF ONCOLOGIC HISTORY: Oncology History  Malignant neoplasm of upper-outer quadrant of right female breast (HCC)  09/26/2022 Mammogram   Patient had a palpable right breast mass and hence underwent bilateral diagnostic mammogram.  This confirmed a suspicious 3 cm right upper outer quadrant mass and an abnormal right axillary lymph node with cortical thickening.  Tissue sampling of both the breast mass and axillary lymph node were recommended.  Left breast was normal.   09/26/2022 Breast US    Ultrasound breast confirmed the above-mentioned findings.   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  Prognostic showed ER 50% positive weak staining PR 0% negative HER2 1+ by IHC and Ki-67 of 70%   10/08/2022 Cancer Staging   Staging form: Breast, AJCC 8th Edition - Clinical: Stage IIB (cT2, cN0, cM0, G3, ER+, PR-, HER2-) - Signed by [name], MD on 10/08/2022 Stage prefix: Initial diagnosis Histologic grading system: 3 grade system   10/24/2022 - 11/14/2022 Chemotherapy   Patient is on Treatment Plan : BREAST Pembrolizumab  (200) D1 + Carboplatin  (5) D1 + Paclitaxel  (80) D1,8,15 q21d X 4 cycles / Pembrolizumab  (200) D1 + AC D1 q21d x 4 cycles      Genetic Testing   [employer] Common Cancer Panel+RNA was Negative. Report date is 10/16/2022.  The Common Hereditary Cancers Panel offered by [facility] includes sequencing and/or deletion duplication testing of the following 48 genes: APC, ATM, AXIN2, BAP1,  BARD1, BMPR1A, BRCA1, BRCA2, BRIP1, CDH1, CDK4, CDKN2A (p14ARF and p16INK4a only), CHEK2, CTNNA1, DICER1, EPCAM (Deletion/duplication testing only), FH, GREM1 (promoter region duplication testing only), HOXB13, KIT, MBD4, MEN1, MLH1, MSH2, MSH3, MSH6, MUTYH, NF1, NHTL1, PALB2, PDGFRA, PMS2, POLD1, POLE, PTEN, RAD51C, RAD51D, SDHA (sequencing analysis only except exon 14), SDHB, SDHC, SDHD, SMAD4, SMARCA4. STK11, TP53, TSC1, TSC2, and VHL.   11/21/2022 - 04/08/2023 Chemotherapy   Patient is on Treatment Plan : BREAST Pembrolizumab  (200) D1 + Carboplatin  (1.5) D1,8,15 + Paclitaxel  (80) D1,8,15 q21d X 4 cycles / Pembrolizumab  (200) D1 + AC D1 q21d x 4 cycles     05/19/2023 -  Chemotherapy   Patient is on Treatment Plan : BREAST Pembrolizumab  (200) q21d x 27 weeks     05/21/2023 Surgery   Right lumpectomy: metaplastic IDC with chondroid differentiation, 1.8 cm, grade 3, margins negative, no LVI, 8 SLN negative, repeat prognostic panel: ER 40% positive, weak staining, PR 0% negative, Ki-67 80%, HER2 negative (0). RCB-II   07/06/2023 - 08/04/2023 Radiation Therapy   Adjuvant radiation therapy     CURRENT THERAPY:  INTERVAL HISTORY: [place] 53 y.o. female returns for follow up while on immunotherapy and xeloda . Discussed the use of AI scribe software for clinical note transcription with the patient, who gave verbal consent to proceed.  History of Present Illness    The patient, with a history of hypothyroidism and currently on immunotherapy for breast cancer, presents with feeling off balance, experiencing hot flashes, tiredness, and mood changes. She reports not taking her thyroid  medication due to its impact on  her mood. She also expresses anxiety about starting a new chemotherapy medication, Xeloda , due to previous experiences with chemotherapy. The patient also mentions concerns about not having anyone to monitor her while on the new medication. She otherwise denies any change in breathing,  bowel habits or urinary habits.  Rest of the pertinent 10 point ROS reviewed and negative  Patient Active Problem List   Diagnosis Date Noted   Port-A-Cath in place 10/24/2022   Genetic testing 10/16/2022   Malignant neoplasm of upper-outer quadrant of right female breast (HCC) 10/06/2022   Thrombocytopenia (HCC) 03/05/2022   Leukopenia 03/05/2022   Hot flashes 02/28/2022   Hair thinning 02/28/2022   Amenorrhea 02/28/2022   Screening for colon cancer 02/28/2022   Multiple nevi 02/28/2022   PTSD (post-traumatic stress disorder) 02/08/2021   Mild episode of recurrent major depressive disorder (HCC) 09/12/2013   Attention deficit disorder 04/25/2013   Migraine 02/05/2013   KELOID SCAR 05/09/2010   URINALYSIS, ABNORMAL 05/09/2010   Anxiety state 03/01/2010   DEPRESSION 03/01/2010   Headache 03/01/2010   Intermittent chest pain 03/01/2010    has no known allergies.  MEDICAL HISTORY: Past Medical History:  Diagnosis Date   Anxiety    Breast cancer (HCC)    right breast IDC   Depression    Hx of febrile seizure    1980   Hypothyroidism     SURGICAL HISTORY: Past Surgical History:  Procedure Laterality Date   BREAST BIOPSY Right 09/30/2022   US  RT BREAST BX W LOC DEV 1ST LESION IMG BX SPEC US  GUIDE 09/30/2022 [facility]MO[facility]   BREAST BIOPSY  05/20/2023   MM RT RADIOACTIVE SEED LOC MAMMO GUIDE 05/20/2023 [facility]MO[facility]   BREAST LUMPECTOMY WITH RADIOACTIVE SEED AND SENTINEL LYMPH NODE BIOPSY Right 05/21/2023   Procedure: RIGHT BREAST LUMPECTOMY WITH RADIOACTIVE SEED AND SENTINEL LYMPH NODE BIOPSY;  Surgeon: [name], MD;  Location: [facility];  Service: General;  Laterality: Right;  LMA PEC BLOCK   EXCISION OF KELOID N/A 05/21/2023   Procedure: EXCISION CHEST WALL SCAR;  Surgeon: [name], MD;  Location: [facility];  Service: General;  Laterality: N/A;   PORTACATH PLACEMENT N/A 10/23/2022   Procedure: INSERTION PORT-A-CATH;   Surgeon: [name], MD;  Location: [facility];  Service: General;  Laterality: N/A;    SOCIAL HISTORY: Social History   Socioeconomic History   Marital status: Single    Spouse name: Not on file   Number of children: Not on file   Years of education: Not on file   Highest education level: Not on file  Occupational History   Not on file  Tobacco Use   Smoking status: Never   Smokeless tobacco: Never  Vaping Use   Vaping status: Never Used  Substance and Sexual Activity   Alcohol use: Not Currently    Comment: social   Drug use: No   Sexual activity: Yes    Partners: Male    Birth control/protection: None  Other Topics Concern   Not on file  Social History Narrative   Not on file   Social Drivers of Health   Financial Resource Strain: High Risk (10/08/2022)   Overall Financial Resource Strain (CARDIA)    Difficulty of Paying Living Expenses: Hard  Food Insecurity: Food Insecurity Present (10/08/2022)   Hunger Vital Sign    Worried About Running Out of Food in the Last Year: Sometimes true    Ran Out of Food in the Last Year:  Sometimes true  Transportation Needs: No Transportation Needs (10/08/2022)   PRAPARE - Administrator, Civil Service (Medical): No    Lack of Transportation (Non-Medical): No  Physical Activity: Not on file  Stress: Not on file  Social Connections: Not on file  Intimate Partner Violence: Not on file    FAMILY HISTORY: Family History  Problem Relation Age of Onset   Breast cancer Sister 4 - 62   Cancer Paternal Grandmother        unknown type    Review of Systems  Constitutional:  Negative for appetite change, chills, fatigue, fever and unexpected weight change.  HENT:   Negative for hearing loss, lump/mass and trouble swallowing.   Eyes:  Negative for eye problems and icterus.  Respiratory:  Negative for chest tightness, cough and shortness of breath.   Cardiovascular:  Negative for chest pain, leg swelling  and palpitations.  Gastrointestinal:  Negative for abdominal distention, abdominal pain, constipation, diarrhea, nausea and vomiting.  Endocrine: Negative for hot flashes.  Genitourinary:  Negative for difficulty urinating.   Musculoskeletal:  Negative for arthralgias.  Skin:  Negative for itching and rash.  Neurological:  Negative for dizziness, extremity weakness, headaches and numbness.  Hematological:  Negative for adenopathy. Does not bruise/bleed easily.  Psychiatric/Behavioral:  Negative for depression. The patient is not nervous/anxious.       PHYSICAL EXAMINATION    Vitals:   09/09/23 1420  BP: 101/60  Pulse: 65  Resp: 18  Temp: 98 F (36.7 C)  SpO2: 97%    Physical Exam Constitutional:      General: She is not in acute distress.    Appearance: Normal appearance. She is not toxic-appearing.  HENT:     Head: Normocephalic and atraumatic.     Mouth/Throat:     Mouth: Mucous membranes are moist.     Pharynx: Oropharynx is clear. No oropharyngeal exudate or posterior oropharyngeal erythema.  Eyes:     General: No scleral icterus. Cardiovascular:     Rate and Rhythm: Normal rate and regular rhythm.     Pulses: Normal pulses.     Heart sounds: Normal heart sounds.  Pulmonary:     Effort: Pulmonary effort is normal.     Breath sounds: Normal breath sounds.  Abdominal:     General: Abdomen is flat. Bowel sounds are normal. There is no distension.     Palpations: Abdomen is soft.     Tenderness: There is no abdominal tenderness.  Musculoskeletal:        General: No swelling.     Cervical back: Neck supple.  Lymphadenopathy:     Cervical: No cervical adenopathy.  Skin:    General: Skin is warm and dry.     Findings: No rash.  Neurological:     General: No focal deficit present.     Mental Status: She is alert.  Psychiatric:        Mood and Affect: Mood normal.        Behavior: Behavior normal.     LABORATORY DATA:  CBC    Component Value Date/Time    WBC 2.4 (L) 09/09/2023 1357   WBC 2.9 (L) 02/28/2022 1049   RBC 3.60 (L) 09/09/2023 1357   HGB 10.7 (L) 09/09/2023 1357   HCT 32.5 (L) 09/09/2023 1357   PLT 153 09/09/2023 1357   MCV 90.3 09/09/2023 1357   MCH 29.7 09/09/2023 1357   MCHC 32.9 09/09/2023 1357   RDW 14.4 09/09/2023 1357  LYMPHSABS 0.5 (L) 09/09/2023 1357   MONOABS 0.3 09/09/2023 1357   EOSABS 0.0 09/09/2023 1357   BASOSABS 0.0 09/09/2023 1357    CMP     Component Value Date/Time   NA 139 08/17/2023 1203   K 3.9 08/17/2023 1203   CL 104 08/17/2023 1203   CO2 32 08/17/2023 1203   GLUCOSE 113 (H) 08/17/2023 1203   BUN 12 08/17/2023 1203   CREATININE 0.77 08/17/2023 1203   CALCIUM 9.5 08/17/2023 1203   PROT 7.2 08/17/2023 1203   ALBUMIN 4.2 08/17/2023 1203   AST 17 08/17/2023 1203   ALT 10 08/17/2023 1203   ALKPHOS 69 08/17/2023 1203   BILITOT 0.4 08/17/2023 1203   GFRNONAA >60 08/17/2023 1203   GFRAA >60 12/23/2015 1151        ASSESSMENT and THERAPY PLAN:   Malignant neoplasm of upper-outer quadrant of right female breast (HCC) Breast Cancer  -Start Xeloda  (oral chemotherapy) given lack of pCR. -Start 1500 mg PO BID given baseline neutropenia. -Once again encouraged her to start today or tomorrow based on labs from today. -Advise patient to report any immediate and intense side effects after starting Xeloda . -Discussed about adverse effects including but not limited to fatigue, nausea, vomiting, diarrhea, hand-foot syndrome, coronary vasospasm, severe toxicity in patients with underlying DPD deficiency.  Hypothyroidism Patient reports feeling off balance, experiencing hot flashes, and feeling tired. Patient has been non-compliant with Levothyroxine  due to perceived mood changes. TSH was significantly elevated in November. -Resume Levothyroxine  at a reduced dose of 25mcg daily for one week to improve tolerance. -Increase to 50mcg daily after one week if tolerated. -Repeat TSH level in the future  to assess response to therapy.  Follow-up -See patient after next cycle of immunotherapy to assess response and tolerance to Xeloda .   All questions were answered. The patient knows to call the clinic with any problems, questions or concerns. We can certainly see the patient much sooner if necessary.

## 2023-09-09 NOTE — Assessment & Plan Note (Signed)
 Breast Cancer  -Start Xeloda  (oral chemotherapy) given lack of pCR. -Start 1500 mg PO BID given baseline neutropenia. -Once again encouraged her to start today or tomorrow based on labs from today. -Advise patient to report any immediate and intense side effects after starting Xeloda . -Discussed about adverse effects including but not limited to fatigue, nausea, vomiting, diarrhea, hand-foot syndrome, coronary vasospasm, severe toxicity in patients with underlying DPD deficiency.  Hypothyroidism Patient reports feeling off balance, experiencing hot flashes, and feeling tired. Patient has been non-compliant with Levothyroxine  due to perceived mood changes. TSH was significantly elevated in November. -Resume Levothyroxine  at a reduced dose of 25mcg daily for one week to improve tolerance. -Increase to 50mcg daily after one week if tolerated. -Repeat TSH level in the future to assess response to therapy.  Follow-up -See patient after next cycle of immunotherapy to assess response and tolerance to Xeloda .

## 2023-09-10 ENCOUNTER — Encounter: Payer: Self-pay | Admitting: *Deleted

## 2023-09-10 LAB — T4: T4, Total: 2.2 ug/dL — ABNORMAL LOW (ref 4.5–12.0)

## 2023-09-11 ENCOUNTER — Other Ambulatory Visit: Payer: Self-pay

## 2023-09-15 DIAGNOSIS — F411 Generalized anxiety disorder: Secondary | ICD-10-CM | POA: Diagnosis not present

## 2023-09-15 DIAGNOSIS — F4310 Post-traumatic stress disorder, unspecified: Secondary | ICD-10-CM | POA: Diagnosis not present

## 2023-09-15 DIAGNOSIS — F330 Major depressive disorder, recurrent, mild: Secondary | ICD-10-CM | POA: Diagnosis not present

## 2023-09-16 ENCOUNTER — Inpatient Hospital Stay: Payer: Medicare HMO

## 2023-09-16 ENCOUNTER — Other Ambulatory Visit: Payer: Self-pay

## 2023-09-16 ENCOUNTER — Inpatient Hospital Stay (HOSPITAL_BASED_OUTPATIENT_CLINIC_OR_DEPARTMENT_OTHER): Payer: Medicare HMO | Admitting: Physician Assistant

## 2023-09-16 ENCOUNTER — Ambulatory Visit (HOSPITAL_COMMUNITY): Payer: Medicare HMO

## 2023-09-16 ENCOUNTER — Telehealth: Payer: Self-pay | Admitting: *Deleted

## 2023-09-16 VITALS — BP 113/61 | HR 72 | Temp 97.8°F | Resp 16 | Wt 166.5 lb

## 2023-09-16 DIAGNOSIS — B349 Viral infection, unspecified: Secondary | ICD-10-CM | POA: Diagnosis not present

## 2023-09-16 DIAGNOSIS — C50411 Malignant neoplasm of upper-outer quadrant of right female breast: Secondary | ICD-10-CM | POA: Diagnosis not present

## 2023-09-16 DIAGNOSIS — R112 Nausea with vomiting, unspecified: Secondary | ICD-10-CM | POA: Diagnosis not present

## 2023-09-16 DIAGNOSIS — N3001 Acute cystitis with hematuria: Secondary | ICD-10-CM

## 2023-09-16 DIAGNOSIS — Z5111 Encounter for antineoplastic chemotherapy: Secondary | ICD-10-CM | POA: Diagnosis not present

## 2023-09-16 DIAGNOSIS — Z79899 Other long term (current) drug therapy: Secondary | ICD-10-CM | POA: Diagnosis not present

## 2023-09-16 DIAGNOSIS — N390 Urinary tract infection, site not specified: Secondary | ICD-10-CM | POA: Diagnosis not present

## 2023-09-16 DIAGNOSIS — Z170 Estrogen receptor positive status [ER+]: Secondary | ICD-10-CM | POA: Diagnosis not present

## 2023-09-16 LAB — URINALYSIS, ROUTINE W REFLEX MICROSCOPIC
Bacteria, UA: NONE SEEN
Bilirubin Urine: NEGATIVE
Glucose, UA: NEGATIVE mg/dL
Ketones, ur: NEGATIVE mg/dL
Nitrite: NEGATIVE
Protein, ur: 100 mg/dL — AB
RBC / HPF: >50 RBC/hpf (ref 0–5)
Specific Gravity, Urine: 1.024 (ref 1.005–1.030)
WBC, UA: >50 WBC/hpf (ref 0–5)
pH: 6 (ref 5.0–8.0)

## 2023-09-16 MED ORDER — NITROFURANTOIN MONOHYD MACRO 100 MG PO CAPS: 100.0000 mg | ORAL_CAPSULE | Freq: Two times a day (BID) | ORAL | 0 refills | Status: AC

## 2023-09-16 MED ORDER — PHENAZOPYRIDINE HCL 200 MG PO TABS: 200.0000 mg | ORAL_TABLET | Freq: Three times a day (TID) | ORAL | 0 refills | Status: AC | PRN

## 2023-09-16 NOTE — Telephone Encounter (Signed)
 Maria Carter states for the last 2 days she has been voiding a lot, now pressure and burning. Saw a little bit of blood this morning.   Will bring into [facility] for UA and culture. See [facility].

## 2023-09-16 NOTE — Progress Notes (Signed)
 Symptom Management Consult Note [facility]    Patient Care Team: Abram Abraham, NP-C as PCP - General (Family Medicine) Oza Blumenthal, MD as Consulting Physician (General Surgery) Murleen Arms, MD as Consulting Physician (Hematology and Oncology) Johna Myers, MD as Consulting Physician (Radiation Oncology) Auther Bo, RN as Oncology Nurse Navigator Alane Hsu, RN as Oncology Nurse Navigator    Name / MRN / DOB: Maria Carter  [id number]  Mar 12, 1971   Date of visit: 09/16/2023   Chief Complaint/Reason for visit: dysuria and frequency   Current Therapy: Keytruda   Last treatment:  Day 1   Cycle 6 on 09/09/23   ASSESSMENT & PLAN: Patient is a 53 y.o. female with oncologic history of Malignant neoplasm of upper-outer quadrant of right female breastfollowed by Dr. Arno Bibles.  I have viewed most recent oncology note and lab work.    #Malignant neoplasm of upper-outer quadrant of right female breast  - Next appointment with oncologist is 09/28/23   #UTI Symptoms started last week, including increased frequency of urination, dysuria, pressure, and hematuria. Urinalysis confirms infection, does also show hematuria. No fever or back pain. Differential includes UTI, pyelonephritis, vs nephrolithiasis.  -Patient is very well appearing. Afebrile, normotensive. - Prescription sent to pharmacy for Macrobid  and phenazopyridine  for symptom control. will follow up on urine culture. - Instruct to seek ER care if symptoms worsen (sharp pain, vomiting, fever). Discussed signs and symptoms of a kidney stone.      Heme/Onc History: Oncology History  Malignant neoplasm of upper-outer quadrant of right female breast (HCC)  09/26/2022 Mammogram   Patient had a palpable right breast mass and hence underwent bilateral diagnostic mammogram.  This confirmed a suspicious 3 cm right upper outer quadrant mass and an abnormal right axillary lymph node with cortical  thickening.  Tissue sampling of both the breast mass and axillary lymph node were recommended.  Left breast was normal.   09/26/2022 Breast US    Ultrasound breast confirmed the above-mentioned findings.   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  Prognostic showed ER 50% positive weak staining PR 0% negative HER2 1+ by IHC and Ki-67 of 70%   10/08/2022 Cancer Staging   Staging form: Breast, AJCC 8th Edition - Clinical: Stage IIB (cT2, cN0, cM0, G3, ER+, PR-, HER2-) - Signed by Murleen Arms, MD on 10/08/2022 Stage prefix: Initial diagnosis Histologic grading system: 3 grade system   10/24/2022 - 11/14/2022 Chemotherapy   Patient is on Treatment Plan : BREAST Pembrolizumab  (200) D1 + Carboplatin  (5) D1 + Paclitaxel  (80) D1,8,15 q21d X 4 cycles / Pembrolizumab  (200) D1 + AC D1 q21d x 4 cycles      Genetic Testing   [employer] Common Cancer Panel+RNA was Negative. Report date is 10/16/2022.  The Common Hereditary Cancers Panel offered by [employer] includes sequencing and/or deletion duplication testing of the following 48 genes: APC, ATM, AXIN2, BAP1, BARD1, BMPR1A, BRCA1, BRCA2, BRIP1, CDH1, CDK4, CDKN2A (p14ARF and p16INK4a only), CHEK2, CTNNA1, DICER1, EPCAM (Deletion/duplication testing only), FH, GREM1 (promoter region duplication testing only), HOXB13, KIT, MBD4, MEN1, MLH1, MSH2, MSH3, MSH6, MUTYH, NF1, NHTL1, PALB2, PDGFRA, PMS2, POLD1, POLE, PTEN, RAD51C, RAD51D, SDHA (sequencing analysis only except exon 14), SDHB, SDHC, SDHD, SMAD4, SMARCA4. STK11, TP53, TSC1, TSC2, and VHL.   11/21/2022 - 04/08/2023 Chemotherapy   Patient is on Treatment Plan : BREAST Pembrolizumab  (200) D1 + Carboplatin  (1.5) D1,8,15 + Paclitaxel  (80) D1,8,15 q21d X 4  cycles / Pembrolizumab  (200) D1 + AC D1 q21d x 4 cycles     05/19/2023 -  Chemotherapy   Patient is on Treatment Plan : BREAST Pembrolizumab  (200) q21d x 27 weeks     05/21/2023 Surgery   Right  lumpectomy: metaplastic IDC with chondroid differentiation, 1.8 cm, grade 3, margins negative, no LVI, 8 SLN negative, repeat prognostic panel: ER 40% positive, weak staining, PR 0% negative, Ki-67 80%, HER2 negative (0). RCB-II   07/06/2023 - 08/04/2023 Radiation Therapy   Adjuvant radiation therapy       Interval history-: Discussed the use of AI scribe software for clinical note transcription with the patient, who gave verbal consent to proceed.   Maria Carter is a 53 y.o. female with oncologic history as above presenting to [facility] today with chief complaint of dysuria and urinary frequency. Patient is accompanied by significant other who provides additional history.  The patient presents dysuria and urinary frequency x 1 week. She initially noticed increased urinary frequency, which progressed to dysuria and a sensation of pressure. The patient also reported seeing blood in her urine on the day of the consultation. She denied any history of UTIs, vaginal discharge, fevers, abdominal pain, or back pain. The patient also has a history of kidney stones, but this episode did not resemble her previous experience. The patient has not had any menstrual cycles for a while due to chemotherapy and being premenopausal. She denied any recent antibiotic use and has no known allergies. She denies any appetite changes.   ROS  All other systems are reviewed and are negative for acute change except as noted in the HPI.    No Known Allergies   Past Medical History:  Diagnosis Date   Anxiety    Breast cancer (HCC)    right breast IDC   Depression    Hx of febrile seizure    1980   Hypothyroidism      Past Surgical History:  Procedure Laterality Date   BREAST BIOPSY Right 09/30/2022   US  RT BREAST BX W LOC DEV 1ST LESION IMG BX SPEC US  GUIDE 09/30/2022 [facility]   BREAST BIOPSY  05/20/2023   MM RT RADIOACTIVE SEED LOC MAMMO GUIDE 05/20/2023 [facility]   BREAST LUMPECTOMY WITH  RADIOACTIVE SEED AND SENTINEL LYMPH NODE BIOPSY Right 05/21/2023   Procedure: RIGHT BREAST LUMPECTOMY WITH RADIOACTIVE SEED AND SENTINEL LYMPH NODE BIOPSY;  Surgeon: Oza Blumenthal, MD;  Location: [facility];  Service: General;  Laterality: Right;  LMA PEC BLOCK   EXCISION OF KELOID N/A 05/21/2023   Procedure: EXCISION CHEST WALL SCAR;  Surgeon: Oza Blumenthal, MD;  Location: [facility];  Service: General;  Laterality: N/A;   PORTACATH PLACEMENT N/A 10/23/2022   Procedure: INSERTION PORT-A-CATH;  Surgeon: Oza Blumenthal, MD;  Location: [facility];  Service: General;  Laterality: N/A;    Social History   Socioeconomic History   Marital status: Single    Spouse name: Not on file   Number of children: Not on file   Years of education: Not on file   Highest education level: Not on file  Occupational History   Not on file  Tobacco Use   Smoking status: Never   Smokeless tobacco: Never  Vaping Use   Vaping status: Never Used  Substance and Sexual Activity   Alcohol use: Not Currently    Comment: social   Drug use: No   Sexual activity: Yes    Partners: Male  Birth control/protection: None  Other Topics Concern   Not on file  Social History Narrative   Not on file   Social Drivers of Health   Financial Resource Strain: High Risk (10/08/2022)   Overall Financial Resource Strain (CARDIA)    Difficulty of Paying Living Expenses: Hard  Food Insecurity: Food Insecurity Present (10/08/2022)   Hunger Vital Sign    Worried About Running Out of Food in the Last Year: Sometimes true    Ran Out of Food in the Last Year: Sometimes true  Transportation Needs: No Transportation Needs (10/08/2022)   PRAPARE - Administrator, Civil Service (Medical): No    Lack of Transportation (Non-Medical): No  Physical Activity: Not on file  Stress: Not on file  Social Connections: Not on file  Intimate Partner Violence: Not on file     Family History  Problem Relation Age of Onset   Breast cancer Sister 41 - 59   Cancer Paternal Grandmother        unknown type     Current Outpatient Medications:    nitrofurantoin , macrocrystal-monohydrate, (MACROBID ) 100 MG capsule, Take 1 capsule (100 mg total) by mouth 2 (two) times daily for 5 days., Disp: 10 capsule, Rfl: 0   phenazopyridine  (PYRIDIUM ) 200 MG tablet, Take 1 tablet (200 mg total) by mouth 3 (three) times daily as needed for up to 2 days for pain., Disp: 6 tablet, Rfl: 0   busPIRone (BUSPAR) 15 MG tablet, Take by mouth. (Patient not taking: Reported on 09/03/2023), Disp: , Rfl:    capecitabine  (XELODA ) 500 MG tablet, Take 3 tabs (1500 mg) in AM and 3 tabs (1500 mg) in PM 14 days on, followed by a 7 day rest period (21 day supply). Take within 30 minutes of a meal, 12 hours apart. (Patient not taking: Reported on 09/03/2023), Disp: 84 tablet, Rfl: 0   desvenlafaxine (PRISTIQ) 100 MG 24 hr tablet, Take 100 mg by mouth daily., Disp: , Rfl:    diclofenac  Sodium (VOLTAREN ) 1 % GEL, Apply 0.5 grams (1 fingertip) to each hand and each foot twice daily for up to 12 weeks, Disp: 400 g, Rfl: 0   Multiple Vitamins-Minerals (MULTIVITAMIN ADULTS PO), Take 1 tablet by mouth daily., Disp: , Rfl:    ondansetron  (ZOFRAN ) 8 MG tablet, Take 1 tablet (8 mg total) by mouth every 8 (eight) hours as needed for nausea or vomiting. (Patient not taking: Reported on 09/03/2023), Disp: 30 tablet, Rfl: 2   prochlorperazine  (COMPAZINE ) 10 MG tablet, Take 1 tablet (10 mg total) by mouth every 6 (six) hours as needed for nausea or vomiting. (Patient not taking: Reported on 09/03/2023), Disp: 30 tablet, Rfl: 0  PHYSICAL EXAM: ECOG FS:1 - Symptomatic but completely ambulatory    Vitals:   09/16/23 1435  BP: 113/61  Pulse: 72  Resp: 16  Temp: 97.8 F (36.6 C)  TempSrc: Temporal  SpO2: 98%  Weight: 166 lb 8 oz (75.5 kg)   Physical Exam Vitals and nursing note reviewed.  Constitutional:       Appearance: She is not ill-appearing or toxic-appearing.  HENT:     Head: Normocephalic.  Eyes:     Conjunctiva/sclera: Conjunctivae normal.  Cardiovascular:     Rate and Rhythm: Normal rate and regular rhythm.     Pulses: Normal pulses.     Heart sounds: Normal heart sounds.  Pulmonary:     Effort: Pulmonary effort is normal.     Breath sounds: Normal breath sounds.  Abdominal:  General: There is no distension.     Palpations: Abdomen is soft.     Tenderness: There is no abdominal tenderness. There is no right CVA tenderness, left CVA tenderness, guarding or rebound.  Musculoskeletal:     Cervical back: Normal range of motion.  Skin:    General: Skin is warm and dry.  Neurological:     Mental Status: She is alert.        LABORATORY DATA: I have reviewed the data as listed    Latest Ref Rng & Units 09/09/2023    1:57 PM 08/17/2023   12:03 PM 07/28/2023   11:04 AM  CBC  WBC 4.0 - 10.5 K/uL 2.4  1.9  1.8   Hemoglobin 12.0 - 15.0 g/dL 08.6  57.8  46.9   Hematocrit 36.0 - 46.0 % 32.5  31.7  31.7   Platelets 150 - 400 K/uL 153  141  140         Latest Ref Rng & Units 09/09/2023    1:57 PM 08/17/2023   12:03 PM 07/28/2023   11:04 AM  CMP  Glucose 70 - 99 mg/dL 90  629  528   BUN 6 - 20 mg/dL 16  12  15    Creatinine 0.44 - 1.00 mg/dL 4.13  2.44  0.10   Sodium 135 - 145 mmol/L 138  139  141   Potassium 3.5 - 5.1 mmol/L 3.8  3.9  3.6   Chloride 98 - 111 mmol/L 102  104  105   CO2 22 - 32 mmol/L 32  32  30   Calcium 8.9 - 10.3 mg/dL 9.9  9.5  9.5   Total Protein 6.5 - 8.1 g/dL 7.5  7.2  7.7   Total Bilirubin 0.0 - 1.2 mg/dL 0.4  0.4  0.3   Alkaline Phos 38 - 126 U/L 84  69  75   AST 15 - 41 U/L 18  17  19    ALT 0 - 44 U/L 10  10  9         RADIOGRAPHIC STUDIES (from last 24 hours if applicable) I have personally reviewed the radiological images as listed and agreed with the findings in the report. No results found.      Visit Diagnosis: 1. Malignant  neoplasm of upper-outer quadrant of right female breast, unspecified estrogen receptor status (HCC)   2. Acute cystitis with hematuria      No orders of the defined types were placed in this encounter.   All questions were answered. The patient knows to call the clinic with any problems, questions or concerns. No barriers to learning was detected.  A total of more than 30 minutes were spent on this encounter with face-to-face time and non-face-to-face time, including preparing to see the patient, ordering tests and/or medications, counseling the patient and coordination of care as outlined above.    Thank you for allowing me to participate in the care of this patient.    Temperence Zenor E  Walisiewicz, PA-C Department of Hematology/Oncology [facility] Phone: [phone]  [phone]    09/16/2023 5:56 PM

## 2023-09-17 ENCOUNTER — Encounter: Payer: Self-pay | Admitting: Hematology and Oncology

## 2023-09-17 ENCOUNTER — Other Ambulatory Visit: Payer: Self-pay

## 2023-09-17 ENCOUNTER — Other Ambulatory Visit (HOSPITAL_COMMUNITY): Payer: Self-pay

## 2023-09-17 NOTE — Progress Notes (Signed)
Patient called pharmacy to report that she has not started Xeloda. Patient has been sick and completing antibiotics. Patient intends to start Xeloda on 1/20. Pharmacy will follow-up with refill outreach and new therapy check in the week of 2/3.

## 2023-09-18 LAB — URINE CULTURE: Culture: 40000 — AB

## 2023-09-28 ENCOUNTER — Other Ambulatory Visit: Payer: Self-pay | Admitting: *Deleted

## 2023-09-28 ENCOUNTER — Inpatient Hospital Stay: Payer: Medicare HMO

## 2023-09-28 ENCOUNTER — Inpatient Hospital Stay (HOSPITAL_BASED_OUTPATIENT_CLINIC_OR_DEPARTMENT_OTHER): Payer: Medicare HMO | Admitting: Hematology and Oncology

## 2023-09-28 VITALS — BP 109/43 | HR 84 | Temp 97.6°F | Resp 16 | Wt 165.8 lb

## 2023-09-28 DIAGNOSIS — B349 Viral infection, unspecified: Secondary | ICD-10-CM | POA: Diagnosis not present

## 2023-09-28 DIAGNOSIS — Z170 Estrogen receptor positive status [ER+]: Secondary | ICD-10-CM | POA: Diagnosis not present

## 2023-09-28 DIAGNOSIS — Z5111 Encounter for antineoplastic chemotherapy: Secondary | ICD-10-CM | POA: Diagnosis not present

## 2023-09-28 DIAGNOSIS — C50411 Malignant neoplasm of upper-outer quadrant of right female breast: Secondary | ICD-10-CM

## 2023-09-28 DIAGNOSIS — R112 Nausea with vomiting, unspecified: Secondary | ICD-10-CM | POA: Diagnosis not present

## 2023-09-28 DIAGNOSIS — Z79899 Other long term (current) drug therapy: Secondary | ICD-10-CM | POA: Diagnosis not present

## 2023-09-28 DIAGNOSIS — Z95828 Presence of other vascular implants and grafts: Secondary | ICD-10-CM

## 2023-09-28 DIAGNOSIS — N390 Urinary tract infection, site not specified: Secondary | ICD-10-CM | POA: Diagnosis not present

## 2023-09-28 LAB — CMP (CANCER CENTER ONLY)
ALT: 14 U/L (ref 0–44)
AST: 19 U/L (ref 15–41)
Albumin: 4.2 g/dL (ref 3.5–5.0)
Alkaline Phosphatase: 79 U/L (ref 38–126)
Anion gap: 5 (ref 5–15)
BUN: 14 mg/dL (ref 6–20)
CO2: 30 mmol/L (ref 22–32)
Calcium: 9.5 mg/dL (ref 8.9–10.3)
Chloride: 103 mmol/L (ref 98–111)
Creatinine: 0.86 mg/dL (ref 0.44–1.00)
GFR, Estimated: >60 mL/min (ref >60)
Glucose, Bld: 118 mg/dL — ABNORMAL HIGH (ref 70–99)
Potassium: 3.7 mmol/L (ref 3.5–5.1)
Sodium: 138 mmol/L (ref 135–145)
Total Bilirubin: 0.5 mg/dL (ref 0.0–1.2)
Total Protein: 7.7 g/dL (ref 6.5–8.1)

## 2023-09-28 LAB — CBC WITH DIFFERENTIAL (CANCER CENTER ONLY)
Abs Immature Granulocytes: 0 10*3/uL (ref 0.00–0.07)
Basophils Absolute: 0 10*3/uL (ref 0.0–0.1)
Basophils Relative: 0 %
Eosinophils Absolute: 0.1 10*3/uL (ref 0.0–0.5)
Eosinophils Relative: 2 %
HCT: 31.7 % — ABNORMAL LOW (ref 36.0–46.0)
Hemoglobin: 10.5 g/dL — ABNORMAL LOW (ref 12.0–15.0)
Immature Granulocytes: 0 %
Lymphocytes Relative: 20 %
Lymphs Abs: 0.5 10*3/uL — ABNORMAL LOW (ref 0.7–4.0)
MCH: 29.8 pg (ref 26.0–34.0)
MCHC: 33.1 g/dL (ref 30.0–36.0)
MCV: 90.1 fL (ref 80.0–100.0)
Monocytes Absolute: 0.2 10*3/uL (ref 0.1–1.0)
Monocytes Relative: 9 %
Neutro Abs: 1.6 10*3/uL — ABNORMAL LOW (ref 1.7–7.7)
Neutrophils Relative %: 69 %
Platelet Count: 143 10*3/uL — ABNORMAL LOW (ref 150–400)
RBC: 3.52 MIL/uL — ABNORMAL LOW (ref 3.87–5.11)
RDW: 14.2 % (ref 11.5–15.5)
WBC Count: 2.3 10*3/uL — ABNORMAL LOW (ref 4.0–10.5)
nRBC: 0 % (ref 0.0–0.2)

## 2023-09-28 LAB — URINALYSIS, COMPLETE (UACMP) WITH MICROSCOPIC
Bilirubin Urine: NEGATIVE
Glucose, UA: NEGATIVE mg/dL
Ketones, ur: NEGATIVE mg/dL
Nitrite: NEGATIVE
Protein, ur: 30 mg/dL — AB
Specific Gravity, Urine: 1.029 (ref 1.005–1.030)
pH: 5 (ref 5.0–8.0)

## 2023-09-28 LAB — PREGNANCY, URINE: Preg Test, Ur: NEGATIVE

## 2023-09-28 MED ORDER — HEPARIN SOD (PORK) LOCK FLUSH 100 UNIT/ML IV SOLN
500.0000 [IU] | Freq: Once | INTRAVENOUS | Status: AC | PRN
  Administered 2023-09-28: 500 [IU]

## 2023-09-28 MED ORDER — SODIUM CHLORIDE 0.9% FLUSH
10.0000 mL | INTRAVENOUS | Status: DC | PRN
Start: 2023-09-28 — End: 2023-09-28
  Administered 2023-09-28: 10 mL

## 2023-09-28 MED ORDER — SODIUM CHLORIDE 0.9 % IV SOLN
200.0000 mg | Freq: Once | INTRAVENOUS | Status: AC
  Administered 2023-09-28: 200 mg via INTRAVENOUS
  Filled 2023-09-28: qty 200

## 2023-09-28 MED ORDER — SODIUM CHLORIDE 0.9 % IV SOLN: INTRAVENOUS | Status: DC

## 2023-09-28 MED ORDER — SODIUM CHLORIDE 0.9% FLUSH
10.0000 mL | Freq: Once | INTRAVENOUS | Status: AC
  Administered 2023-09-28: 10 mL

## 2023-09-28 MED ORDER — SODIUM CHLORIDE 0.9 % IV SOLN: Freq: Once | INTRAVENOUS | Status: AC

## 2023-09-28 NOTE — Progress Notes (Signed)
[facility] Cancer Follow up:    Maria Shackleton, NP-C [address] [place] [address]   DIAGNOSIS:  Cancer Staging  Malignant neoplasm of upper-outer quadrant of right female breast [facility]) Staging form: Breast, AJCC 8th Edition - Clinical: Stage IIB (cT2, cN0, cM0, G3, ER+, PR-, HER2-) - Signed by [name], MD on 10/08/2022 Stage prefix: Initial diagnosis Histologic grading system: 3 grade system    SUMMARY OF ONCOLOGIC HISTORY: Oncology History  Malignant neoplasm of upper-outer quadrant of right female breast (HCC)  09/26/2022 Mammogram   Patient had a palpable right breast mass and hence underwent bilateral diagnostic mammogram.  This confirmed a suspicious 3 cm right upper outer quadrant mass and an abnormal right axillary lymph node with cortical thickening.  Tissue sampling of both the breast mass and axillary lymph node were recommended.  Left breast was normal.   09/26/2022 Breast US   Ultrasound breast confirmed the above-mentioned findings.   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  Prognostic showed ER 50% positive weak staining PR 0% negative HER2 1+ by IHC and Ki-67 of 70%   10/08/2022 Cancer Staging   Staging form: Breast, AJCC 8th Edition - Clinical: Stage IIB (cT2, cN0, cM0, G3, ER+, PR-, HER2-) - Signed by [name], MD on 10/08/2022 Stage prefix: Initial diagnosis Histologic grading system: 3 grade system   10/24/2022 - 11/14/2022 Chemotherapy   Patient is on Treatment Plan : BREAST Pembrolizumab (200) D1 + Carboplatin (5) D1 + Paclitaxel (80) D1,8,15 q21d X 4 cycles / Pembrolizumab (200) D1 + AC D1 q21d x 4 cycles      Genetic Testing   [facility] Common Cancer Panel+RNA was Negative. Report date is 10/16/2022.  The Common Hereditary Cancers Panel offered by [facility] includes sequencing and/or deletion duplication testing of the following 48 genes: APC, ATM, AXIN2, BAP1,  BARD1, BMPR1A, BRCA1, BRCA2, BRIP1, CDH1, CDK4, CDKN2A (p14ARF and p16INK4a only), CHEK2, CTNNA1, DICER1, EPCAM (Deletion/duplication testing only), FH, GREM1 (promoter region duplication testing only), HOXB13, KIT, MBD4, MEN1, MLH1, MSH2, MSH3, MSH6, MUTYH, NF1, NHTL1, PALB2, PDGFRA, PMS2, POLD1, POLE, PTEN, RAD51C, RAD51D, SDHA (sequencing analysis only except exon 14), SDHB, SDHC, SDHD, SMAD4, SMARCA4. STK11, TP53, TSC1, TSC2, and VHL.   11/21/2022 - 04/08/2023 Chemotherapy   Patient is on Treatment Plan : BREAST Pembrolizumab (200) D1 + Carboplatin (1.5) D1,8,15 + Paclitaxel (80) D1,8,15 q21d X 4 cycles / Pembrolizumab (200) D1 + AC D1 q21d x 4 cycles     05/19/2023 -  Chemotherapy   Patient is on Treatment Plan : BREAST Pembrolizumab (200) q21d x 27 weeks     05/21/2023 Surgery   Right lumpectomy: metaplastic IDC with chondroid differentiation, 1.8 cm, grade 3, margins negative, no LVI, 8 SLN negative, repeat prognostic panel: ER 40% positive, weak staining, PR 0% negative, Ki-67 80%, HER2 negative (0). RCB-II   07/06/2023 - 08/04/2023 Radiation Therapy   Adjuvant radiation therapy     CURRENT THERAPY:  INTERVAL HISTORY: Maria Carter 53 y.o. female returns for follow up while on immunotherapy and xeloda. Discussed the use of AI scribe software for clinical note transcription with the patient, who gave verbal consent to proceed.  History of Present Illness    The patient, with a history of cancer and recent urinary tract infection (UTI), presents with persistent nausea, vomiting, and headaches. She reports that these symptoms are unlike those experienced during her previous chemotherapy and immunotherapy treatments. The patient  also describes a sensation of general malaise, body aches, and chest pain, which she finds concerning.  The patient's recent UTI was characterized by frequent urination and a burning sensation during urination. She was treated with antibiotics, which seemed to  alleviate the urinary symptoms. However, the patient continues to experience nausea and vomiting, which she initially attributed to her cancer medication, Xeloda (capecitabine). She discontinued the Xeloda after a few days due to the severity of these side effects.  The patient also reports experiencing hot flashes and "brain fog," and feels generally unwell. She has not resumed menstruation since undergoing chemotherapy.  The patient's blood pressure has been low, and she has not been eating or drinking much due to her symptoms. She denies having fevers, chills, or exposure to anyone with flu-like symptoms. Despite these ongoing issues, the patient is eager to identify the cause of her symptoms and find a solution.  Rest of the pertinent 10 point ROS reviewed and negative  Patient Active Problem List   Diagnosis Date Noted   Port-A-Cath in place 10/24/2022   Genetic testing 10/16/2022   Malignant neoplasm of upper-outer quadrant of right female breast (HCC) 10/06/2022   Thrombocytopenia (HCC) 03/05/2022   Leukopenia 03/05/2022   Hot flashes 02/28/2022   Hair thinning 02/28/2022   Amenorrhea 02/28/2022   Screening for colon cancer 02/28/2022   Multiple nevi 02/28/2022   PTSD (post-traumatic stress disorder) 02/08/2021   Mild episode of recurrent major depressive disorder (HCC) 09/12/2013   Attention deficit disorder 04/25/2013   Migraine 02/05/2013   KELOID SCAR 05/09/2010   URINALYSIS, ABNORMAL 05/09/2010   Anxiety state 03/01/2010   DEPRESSION 03/01/2010   Headache 03/01/2010   Intermittent chest pain 03/01/2010    has no known allergies.  MEDICAL HISTORY: Past Medical History:  Diagnosis Date   Anxiety    Breast cancer (HCC)    right breast IDC   Depression    Hx of febrile seizure    1980   Hypothyroidism     SURGICAL HISTORY: Past Surgical History:  Procedure Laterality Date   BREAST BIOPSY Right 09/30/2022   [place] RT BREAST BX W LOC DEV 1ST LESION IMG BX SPEC US  GUIDE 09/30/2022 [facility]MO[facility]AP[facility]   BREAST BIOPSY  05/20/2023   MM RT RADIOACTIVE SEED LOC MAMMO GUIDE 05/20/2023 [facility] MAMMOGRAPHY   BREAST LUMPECTOMY WITH RADIOACTIVE SEED AND SENTINEL LYMPH NODE BIOPSY Right 05/21/2023   Procedure: RIGHT BREAST LUMPECTOMY WITH RADIOACTIVE SEED AND SENTINEL LYMPH NODE BIOPSY;  Surgeon: [name], MD;  Location: [facility];  Service: General;  Laterality: Right;  LMA PEC BLOCK   EXCISION OF KELOID N/A 05/21/2023   Procedure: EXCISION CHEST WALL SCAR;  Surgeon: [name], MD;  Location: [facility];  Service: General;  Laterality: N/A;   PORTACATH PLACEMENT N/A 10/23/2022   Procedure: INSERTION PORT-A-CATH;  Surgeon: [name], MD;  Location: [facility];  Service: General;  Laterality: N/A;    SOCIAL HISTORY: Social History   Socioeconomic History   Marital status: Single    Spouse name: Not on file   Number of children: Not on file   Years of education: Not on file   Highest education level: Not on file  Occupational History   Not on file  Tobacco Use   Smoking status: Never   Smokeless tobacco: Never  Vaping Use   Vaping status: Never Used  Substance and Sexual Activity   Alcohol use: Not Currently    Comment: social  Drug use: No   Sexual activity: Yes    Partners: Male    Birth control/protection: None  Other Topics Concern   Not on file  Social History Narrative   Not on file   Social Drivers of Health   Financial Resource Strain: High Risk (10/08/2022)   Overall Financial Resource Strain (CARDIA)    Difficulty of Paying Living Expenses: Hard  Food Insecurity: Food Insecurity Present (09/03/2023)   Hunger Vital Sign    Worried About Running Out of Food in the Last Year: Sometimes true    Ran Out of Food in the Last Year: Sometimes true  Transportation Needs: No Transportation Needs (10/08/2022)   PRAPARE - Administrator, Civil Service (Medical): No    Lack  of Transportation (Non-Medical): No  Physical Activity: Not on file  Stress: Not on file  Social Connections: Not on file  Intimate Partner Violence: Not on file    FAMILY HISTORY: Family History  Problem Relation Age of Onset   Breast cancer Sister 76 - 73   Cancer Paternal Grandmother        unknown type    Review of Systems  Constitutional:  Negative for appetite change, chills, fatigue, fever and unexpected weight change.  HENT:   Negative for hearing loss, lump/mass and trouble swallowing.   Eyes:  Negative for eye problems and icterus.  Respiratory:  Negative for chest tightness, cough and shortness of breath.   Cardiovascular:  Negative for chest pain, leg swelling and palpitations.  Gastrointestinal:  Negative for abdominal distention, abdominal pain, constipation, diarrhea, nausea and vomiting.  Endocrine: Negative for hot flashes.  Genitourinary:  Negative for difficulty urinating.   Musculoskeletal:  Negative for arthralgias.  Skin:  Negative for itching and rash.  Neurological:  Negative for dizziness, extremity weakness, headaches and numbness.  Hematological:  Negative for adenopathy. Does not bruise/bleed easily.  Psychiatric/Behavioral:  Negative for depression. The patient is not nervous/anxious.       PHYSICAL EXAMINATION    Vitals:   09/28/23 0959  BP: (!) 109/43  Pulse: 84  Resp: 16  Temp: 97.6 F (36.4 C)  SpO2: 97%    Physical Exam Constitutional:      General: She is not in acute distress.    Appearance: Normal appearance. She is not toxic-appearing.  HENT:     Head: Normocephalic and atraumatic.     Mouth/Throat:     Mouth: Mucous membranes are moist.     Pharynx: Oropharynx is clear. No oropharyngeal exudate or posterior oropharyngeal erythema.  Eyes:     General: No scleral icterus. Cardiovascular:     Rate and Rhythm: Normal rate and regular rhythm.     Pulses: Normal pulses.     Heart sounds: Normal heart sounds.  Pulmonary:      Effort: Pulmonary effort is normal.     Breath sounds: Normal breath sounds.  Abdominal:     General: Abdomen is flat. Bowel sounds are normal. There is no distension.     Palpations: Abdomen is soft.     Tenderness: There is no abdominal tenderness.  Musculoskeletal:        General: No swelling.     Cervical back: Neck supple.  Lymphadenopathy:     Cervical: No cervical adenopathy.  Skin:    General: Skin is warm and dry.     Findings: No rash.  Neurological:     General: No focal deficit present.     Mental Status: She is  alert.  Psychiatric:        Mood and Affect: Mood normal.        Behavior: Behavior normal.     LABORATORY DATA:  CBC    Component Value Date/Time   WBC 2.3 (L) 09/28/2023 0938   WBC 2.9 (L) 02/28/2022 1049   RBC 3.52 (L) 09/28/2023 0938   HGB 10.5 (L) 09/28/2023 0938   HCT 31.7 (L) 09/28/2023 0938   PLT 143 (L) 09/28/2023 0938   MCV 90.1 09/28/2023 0938   MCH 29.8 09/28/2023 0938   MCHC 33.1 09/28/2023 0938   RDW 14.2 09/28/2023 0938   LYMPHSABS 0.5 (L) 09/28/2023 0938   MONOABS 0.2 09/28/2023 0938   EOSABS 0.1 09/28/2023 0938   BASOSABS 0.0 09/28/2023 0938    CMP     Component Value Date/Time   NA 138 09/28/2023 0938   K 3.7 09/28/2023 0938   CL 103 09/28/2023 0938   CO2 30 09/28/2023 0938   GLUCOSE 118 (H) 09/28/2023 0938   BUN 14 09/28/2023 0938   CREATININE 0.86 09/28/2023 0938   CALCIUM 9.5 09/28/2023 0938   PROT 7.7 09/28/2023 0938   ALBUMIN 4.2 09/28/2023 0938   AST 19 09/28/2023 0938   ALT 14 09/28/2023 0938   ALKPHOS 79 09/28/2023 0938   BILITOT 0.5 09/28/2023 0938   GFRNONAA >60 09/28/2023 0938   GFRAA >60 12/23/2015 1151        ASSESSMENT and THERAPY PLAN:   Malignant neoplasm of upper-outer quadrant of right female breast (HCC) Breast Cancer  Maria Carter is a 53 year old woman with 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  Residual cancer burden was rated at RCB II.  Repeat prognostics showed ER 40% weak staining positive -Continue Keytruda immunotherapy. RCB II -Consider oral chemotherapy with Xeloda after completion of radiation therapy, based on phase two studies suggesting potential benefit in patients without complete pathologic response. She hasn't been able to take the pill, she tried it for a few days, says she didn't feel good. 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 -She completed adj radiation Dec 3 rd. She may benefit from anti estrogens given weak ER positivity.  Urinary Tract Infection Initial symptoms of frequent urination and burning have improved with antibiotic treatment.  -Repeat urinalysis to confirm resolution of infection.  Nausea/Vomiting Ongoing symptoms, not clearly related to UTI. Possible side effect of Xeloda (capecitabine), but patient has not been taking it consistently. -Hold Xeloda for now. -Administer IV fluids during today's visit to address possible dehydration. -She more than likely has an ongoing viral prodrome.  Unspecified Viral Infection Patient reports body aches, headaches, nausea and low blood pressure. No fever or chills. Possible viral infection given the season. -Monitor symptoms and patient to update on condition by Friday.   Follow-up Patient to call on Friday to update on condition. If no improvement, further evaluation and management will be considered.   All questions were answered. The patient knows to call the clinic with any problems, questions or concerns. We can certainly see the patient  much sooner if necessary.

## 2023-09-28 NOTE — Assessment & Plan Note (Signed)
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  Residual cancer burden was rated at RCB II.  Repeat prognostics showed ER 40% weak staining positive -Continue Keytruda immunotherapy. RCB II -Consider oral chemotherapy with Xeloda after completion of radiation therapy, based on phase two studies suggesting potential benefit in patients without complete pathologic response. She hasn't been able to take the pill, she tried it for a few days, says she didn't feel good. 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 -She completed adj radiation Dec 3 rd. She may benefit from anti estrogens given weak ER positivity.  Urinary Tract Infection Initial symptoms of frequent urination and burning have improved with antibiotic treatment.  -Repeat urinalysis to confirm resolution of infection.  Nausea/Vomiting Ongoing symptoms, not clearly related to UTI. Possible side effect of Xeloda (capecitabine), but patient has not been taking it consistently. -Hold Xeloda for now. -Administer IV fluids during today's visit to address possible dehydration. -She more than likely has an ongoing viral prodrome.  Unspecified Viral Infection Patient reports body aches, headaches, nausea and low blood pressure. No fever or chills. Possible viral infection given the season. -Monitor symptoms and patient to update on condition by Friday.   Follow-up Patient to call on Friday to update on condition. If no improvement, further  evaluation and management will be considered.

## 2023-09-28 NOTE — Patient Instructions (Signed)
[facility] - A DEPT OF [facility]  Discharge Instructions: Thank you for choosing [facility] to provide your oncology and hematology care.   If you have a lab appointment with the Cancer Center, please go directly to the Cancer Center and check in at the registration area.   Wear comfortable clothing and clothing appropriate for easy access to any Portacath or PICC line.   We strive to give you quality time with your provider. You may need to reschedule your appointment if you arrive late (15 or more minutes).  Arriving late affects you and other patients whose appointments are after yours.  Also, if you miss three or more appointments without notifying the office, you may be dismissed from the clinic at the provider's discretion.      For prescription refill requests, have your pharmacy contact our office and allow 72 hours for refills to be completed.    Today you received the following chemotherapy and/or immunotherapy agents: Keytruda      To help prevent nausea and vomiting after your treatment, we encourage you to take your nausea medication as directed.  BELOW ARE SYMPTOMS THAT SHOULD BE REPORTED IMMEDIATELY: *FEVER GREATER THAN 100.4 F (38 C) OR HIGHER *CHILLS OR SWEATING *NAUSEA AND VOMITING THAT IS NOT CONTROLLED WITH YOUR NAUSEA MEDICATION *UNUSUAL SHORTNESS OF BREATH *UNUSUAL BRUISING OR BLEEDING *URINARY PROBLEMS (pain or burning when urinating, or frequent urination) *BOWEL PROBLEMS (unusual diarrhea, constipation, pain near the anus) TENDERNESS IN MOUTH AND THROAT WITH OR WITHOUT PRESENCE OF ULCERS (sore throat, sores in mouth, or a toothache) UNUSUAL RASH, SWELLING OR PAIN  UNUSUAL VAGINAL DISCHARGE OR ITCHING   Items with * indicate a potential emergency and should be followed up as soon as possible or go to the Emergency Department if any problems should occur.  Please show the CHEMOTHERAPY ALERT CARD or IMMUNOTHERAPY  ALERT CARD at check-in to the Emergency Department and triage nurse.  Should you have questions after your visit or need to cancel or reschedule your appointment, please contact [facility] - A DEPT OF [facility]  Dept: [phone]  and follow the prompts.  Office hours are 8:00 a.m. to 4:30 p.m. Monday - Friday. Please note that voicemails left after 4:00 p.m. may not be returned until the following business day.  We are closed weekends and major holidays. You have access to a nurse at all times for urgent questions. Please call the main number to the clinic Dept: [phone] and follow the prompts.   For any non-urgent questions, you may also contact your provider using MyChart. We now offer e-Visits for anyone 107 and older to request care online for non-urgent symptoms. For details visit [email or web address].   Also download the MyChart app! Go to the app store, search "MyChart", open the app, select [facility], and log in with your MyChart username and password.

## 2023-09-30 ENCOUNTER — Other Ambulatory Visit: Payer: Self-pay | Admitting: Hematology and Oncology

## 2023-09-30 ENCOUNTER — Telehealth: Payer: Self-pay | Admitting: *Deleted

## 2023-09-30 ENCOUNTER — Other Ambulatory Visit: Payer: Self-pay

## 2023-09-30 MED ORDER — CIPROFLOXACIN HCL 500 MG PO TABS: 500.0000 mg | ORAL_TABLET | Freq: Two times a day (BID) | ORAL | 0 refills | Status: DC

## 2023-09-30 NOTE — Progress Notes (Signed)
Urine appears turbid and bacteria present, based on previous culture and sensitivity, I sent a week course of ciprofloxacin. Pt should report if she continues to feel unwell about 5 days from now.

## 2023-09-30 NOTE — Telephone Encounter (Addendum)
-----   Message from Tab Iruku sent at 09/30/2023 11:29 AM EST ----- Urine appears turbid and bacteria present, based on previous culture and sensitivity, I sent a week course of ciprofloxacin. Pt should report if she continues to feel unwell about 5 days from now.  Called and informed her

## 2023-10-03 ENCOUNTER — Encounter (HOSPITAL_COMMUNITY): Payer: Self-pay | Admitting: *Deleted

## 2023-10-03 ENCOUNTER — Other Ambulatory Visit: Payer: Self-pay

## 2023-10-03 ENCOUNTER — Emergency Department (HOSPITAL_COMMUNITY)
Admission: EM | Admit: 2023-10-03 | Discharge: 2023-10-03 | Disposition: A | Discharge status: Home / Self Care | Payer: Medicare HMO | Attending: Emergency Medicine | Admitting: Emergency Medicine

## 2023-10-03 DIAGNOSIS — Z79899 Other long term (current) drug therapy: Secondary | ICD-10-CM | POA: Diagnosis not present

## 2023-10-03 DIAGNOSIS — E860 Dehydration: Secondary | ICD-10-CM | POA: Insufficient documentation

## 2023-10-03 DIAGNOSIS — R42 Dizziness and giddiness: Secondary | ICD-10-CM | POA: Diagnosis not present

## 2023-10-03 LAB — URINALYSIS, ROUTINE W REFLEX MICROSCOPIC
Bilirubin Urine: NEGATIVE
Glucose, UA: NEGATIVE mg/dL
Hgb urine dipstick: NEGATIVE
Ketones, ur: NEGATIVE mg/dL
Nitrite: NEGATIVE
Protein, ur: NEGATIVE mg/dL
Specific Gravity, Urine: 1.027 (ref 1.005–1.030)
pH: 6 (ref 5.0–8.0)

## 2023-10-03 LAB — COMPREHENSIVE METABOLIC PANEL
ALT: 14 U/L (ref 0–44)
AST: 19 U/L (ref 15–41)
Albumin: 3.8 g/dL (ref 3.5–5.0)
Alkaline Phosphatase: 86 U/L (ref 38–126)
Anion gap: 10 (ref 5–15)
BUN: 16 mg/dL (ref 6–20)
CO2: 26 mmol/L (ref 22–32)
Calcium: 9.4 mg/dL (ref 8.9–10.3)
Chloride: 103 mmol/L (ref 98–111)
Creatinine, Ser: 0.91 mg/dL (ref 0.44–1.00)
GFR, Estimated: >60 mL/min (ref >60)
Glucose, Bld: 114 mg/dL — ABNORMAL HIGH (ref 70–99)
Potassium: 3.5 mmol/L (ref 3.5–5.1)
Sodium: 139 mmol/L (ref 135–145)
Total Bilirubin: 0.7 mg/dL (ref 0.0–1.2)
Total Protein: 7.3 g/dL (ref 6.5–8.1)

## 2023-10-03 LAB — CBC
HCT: 35.3 % — ABNORMAL LOW (ref 36.0–46.0)
Hemoglobin: 11.2 g/dL — ABNORMAL LOW (ref 12.0–15.0)
MCH: 29.9 pg (ref 26.0–34.0)
MCHC: 31.7 g/dL (ref 30.0–36.0)
MCV: 94.1 fL (ref 80.0–100.0)
Platelets: 149 10*3/uL — ABNORMAL LOW (ref 150–400)
RBC: 3.75 MIL/uL — ABNORMAL LOW (ref 3.87–5.11)
RDW: 14.3 % (ref 11.5–15.5)
WBC: 2.1 10*3/uL — ABNORMAL LOW (ref 4.0–10.5)
nRBC: 0 % (ref 0.0–0.2)

## 2023-10-03 LAB — HCG, SERUM, QUALITATIVE: Preg, Serum: NEGATIVE

## 2023-10-03 NOTE — ED Provider Notes (Signed)
[place] EMERGENCY DEPARTMENT AT [facility] Provider Note   CSN: [id number] Arrival date & time: 10/03/23  0009     History  Chief Complaint  Patient presents with   Dizziness    Maria Carter is a 53 y.o. female.  53 year old female presents with dizziness secondary to low blood pressure.  Patient states yesterday when she stood up she felt lightheaded and that she is in a pass out.  Took her blood pressure and it was 90/60.  Denies any recent history of volume loss.  She is currently being treated with Keflex for UTI.  She is on day 3 of therapy.  She has not had any fevers per denies any abdominal discomfort or flank discomfort.  Notes compliance with this medication.  Denies taking any antihypertensives.  States gradually she has began to feel better.  Does note she is getting chemotherapy at this time and has had somewhat decreased p.o. intake.  Feels back to her baseline at this time       Home Medications Prior to Admission medications   Medication Sig Start Date End Date Taking? Authorizing Provider  busPIRone (BUSPAR) 15 MG tablet Take by mouth. Patient not taking: Reported on 09/03/2023 08/06/22   [provider]  capecitabine (XELODA) 500 MG tablet Take 3 tabs (1500 mg) in AM and 3 tabs (1500 mg) in PM 14 days on, followed by a 7 day rest period (21 day supply). Take within 30 minutes of a meal, 12 hours apart. Patient not taking: Reported on 09/03/2023 08/17/23   [name], MD  ciprofloxacin (CIPRO) 500 MG tablet Take 1 tablet (500 mg total) by mouth 2 (two) times daily. 09/30/23   [name], MD  desvenlafaxine (PRISTIQ) 100 MG 24 hr tablet Take 100 mg by mouth daily. 11/26/21   [provider]  diclofenac Sodium (VOLTAREN) 1 % GEL Apply 0.5 grams (1 fingertip) to each hand and each foot twice daily for up to 12 weeks 09/03/23   [name], MD  Multiple Vitamins-Minerals (MULTIVITAMIN ADULTS PO) Take 1 tablet by mouth daily.     [provider]  ondansetron (ZOFRAN) 8 MG tablet Take 1 tablet (8 mg total) by mouth every 8 (eight) hours as needed for nausea or vomiting. Patient not taking: Reported on 09/03/2023 09/03/23   [name], MD  prochlorperazine (COMPAZINE) 10 MG tablet Take 1 tablet (10 mg total) by mouth every 6 (six) hours as needed for nausea or vomiting. Patient not taking: Reported on 09/03/2023 09/03/23   [name], MD      Allergies    Patient has no known allergies.    Review of Systems   Review of Systems  All other systems reviewed and are negative.   Physical Exam Updated Vital Signs BP 111/64 (BP Location: Left Arm)   Pulse 64   Temp 98.4 F (36.9 C) (Oral)   Resp 18   Ht 1.549 m (5\' 1" )   Wt 75.2 kg   SpO2 100%   BMI 31.32 kg/m  Physical Exam Vitals and nursing note reviewed.  Constitutional:      General: She is not in acute distress.    Appearance: Normal appearance. She is well-developed. She is not toxic-appearing.  HENT:     Head: Normocephalic and atraumatic.  Eyes:     General: Lids are normal.     Conjunctiva/sclera: Conjunctivae normal.     Pupils: Pupils are equal, round, and reactive to light.  Neck:  Thyroid: No thyroid mass.     Trachea: No tracheal deviation.  Cardiovascular:     Rate and Rhythm: Normal rate and regular rhythm.     Heart sounds: Normal heart sounds. No murmur heard.    No gallop.  Pulmonary:     Effort: Pulmonary effort is normal. No respiratory distress.     Breath sounds: Normal breath sounds. No stridor. No decreased breath sounds, wheezing, rhonchi or rales.  Abdominal:     General: There is no distension.     Palpations: Abdomen is soft.     Tenderness: There is no abdominal tenderness. There is no rebound.  Musculoskeletal:        General: No tenderness. Normal range of motion.     Cervical back: Normal range of motion and neck supple.  Skin:    General: Skin is warm and dry.     Findings: No abrasion or rash.   Neurological:     General: No focal deficit present.     Mental Status: She is alert and oriented to person, place, and time. Mental status is at baseline.     GCS: GCS eye subscore is 4. GCS verbal subscore is 5. GCS motor subscore is 6.     Cranial Nerves: Cranial nerves 2-12 are intact. No cranial nerve deficit.     Sensory: No sensory deficit.     Motor: Motor function is intact.     Gait: Gait is intact.  Psychiatric:        Attention and Perception: Attention normal.        Speech: Speech normal.        Behavior: Behavior normal.     ED Results / Procedures / Treatments   Labs (all labs ordered are listed, but only abnormal results are displayed) Labs Reviewed  COMPREHENSIVE METABOLIC PANEL - Abnormal; Notable for the following components:      Result Value   Glucose, Bld 114 (*)    All other components within normal limits  CBC - Abnormal; Notable for the following components:   WBC 2.1 (*)    RBC 3.75 (*)    Hemoglobin 11.2 (*)    HCT 35.3 (*)    Platelets 149 (*)    All other components within normal limits  URINALYSIS, ROUTINE W REFLEX MICROSCOPIC - Abnormal; Notable for the following components:   APPearance HAZY (*)    Leukocytes,Ua LARGE (*)    Bacteria, UA RARE (*)    All other components within normal limits  HCG, SERUM, QUALITATIVE    EKG EKG Interpretation Date/Time:  Saturday October 03 2023 00:28:26 EST Ventricular Rate:  69 PR Interval:  172 QRS Duration:  86 QT Interval:  404 QTC Calculation: 432 R Axis:   88  Text Interpretation: Normal sinus rhythm Normal ECG When compared with ECG of 28-Jul-2023 12:29, PREVIOUS ECG IS PRESENT Confirmed by Lorre Nick ([id number]) on 10/03/2023 7:35:19 AM  Radiology No results found.  Procedures Procedures    Medications Ordered in ED Medications - No data to display  ED Course/ Medical Decision Making/ A&P                                 Medical Decision Making  Patient is EKG per my  interpretation shows normal sinus rhythm.  Patient's hemoglobin is stable here at 11.2.  No evidence of renal involvement.  Patient's blood pressure is stable.  Patient walked around  the department and denies being symptomatic at this time.  Suspect possibly some mild dehydration.  Patient's urinalysis shows 21-50 white blood cells.  6-10 red cells.  Patient will continue taking her antibiotics at this time.  Do not feel that she has any signs of sepsis at this time.  Not concerned about pyelonephritis.  Return precautions given        Final Clinical Impression(s) / ED Diagnoses Final diagnoses:  None    Rx / DC Orders ED Discharge Orders     None         Lorre Nick, MD 10/03/23 [phone]

## 2023-10-03 NOTE — Discharge Instructions (Signed)
Drink plenty of liquids.  Continue your antibiotics.  Return here for vomiting, abdominal pain, or worsening dizziness

## 2023-10-03 NOTE — ED Triage Notes (Signed)
The pt has breast cancer and is getting immunotherapy iv for the same

## 2023-10-03 NOTE — ED Triage Notes (Signed)
The pt reports that she has had dizziness  that started today   she has recently been on an antibiotic for a uti

## 2023-10-06 ENCOUNTER — Other Ambulatory Visit: Payer: Self-pay

## 2023-10-08 DIAGNOSIS — F330 Major depressive disorder, recurrent, mild: Secondary | ICD-10-CM | POA: Diagnosis not present

## 2023-10-08 DIAGNOSIS — F4310 Post-traumatic stress disorder, unspecified: Secondary | ICD-10-CM | POA: Diagnosis not present

## 2023-10-08 DIAGNOSIS — F411 Generalized anxiety disorder: Secondary | ICD-10-CM | POA: Diagnosis not present

## 2023-10-12 ENCOUNTER — Other Ambulatory Visit: Payer: Self-pay | Admitting: Hematology and Oncology

## 2023-10-15 ENCOUNTER — Other Ambulatory Visit: Payer: Self-pay

## 2023-10-19 ENCOUNTER — Inpatient Hospital Stay: Payer: Medicare HMO

## 2023-10-19 ENCOUNTER — Telehealth: Payer: Self-pay | Admitting: *Deleted

## 2023-10-19 ENCOUNTER — Other Ambulatory Visit (HOSPITAL_COMMUNITY): Payer: Self-pay

## 2023-10-19 ENCOUNTER — Inpatient Hospital Stay: Payer: Medicare HMO | Admitting: Hematology and Oncology

## 2023-10-19 NOTE — Telephone Encounter (Signed)
This RN spoke with pt per her VM left earlier today stating she is unable to come in today due to "had an issue of constipation that I aggressively treated over the weekend and when I was getting ready to leave for my appt today I had an accident and messed all over myself "  " Just so frustrated so I just need to reschedule my appt"  Per above discussion this RN verified that pt has not restarted the xeloda at this time - with pt stating she was told to hold it per her last visit.  Presently pt is doing well and is able to manage her bowels.  This RN sent a high priority rescheduling request per treatment today.  This note will be forwarded to MD for review of communication - no further needs at present.

## 2023-10-20 ENCOUNTER — Other Ambulatory Visit: Payer: Self-pay

## 2023-10-22 ENCOUNTER — Emergency Department (HOSPITAL_COMMUNITY): Payer: Medicare HMO

## 2023-10-22 ENCOUNTER — Other Ambulatory Visit: Payer: Self-pay

## 2023-10-22 ENCOUNTER — Emergency Department (HOSPITAL_COMMUNITY)
Admission: EM | Admit: 2023-10-22 | Discharge: 2023-10-23 | Disposition: A | Discharge status: Home / Self Care | Payer: Medicare HMO | Attending: Emergency Medicine | Admitting: Emergency Medicine

## 2023-10-22 ENCOUNTER — Encounter (HOSPITAL_COMMUNITY): Payer: Self-pay | Admitting: Emergency Medicine

## 2023-10-22 DIAGNOSIS — R42 Dizziness and giddiness: Secondary | ICD-10-CM | POA: Insufficient documentation

## 2023-10-22 DIAGNOSIS — R930 Abnormal findings on diagnostic imaging of skull and head, not elsewhere classified: Secondary | ICD-10-CM | POA: Insufficient documentation

## 2023-10-22 DIAGNOSIS — E039 Hypothyroidism, unspecified: Secondary | ICD-10-CM | POA: Diagnosis not present

## 2023-10-22 DIAGNOSIS — R110 Nausea: Secondary | ICD-10-CM | POA: Diagnosis not present

## 2023-10-22 DIAGNOSIS — R519 Headache, unspecified: Secondary | ICD-10-CM | POA: Diagnosis not present

## 2023-10-22 DIAGNOSIS — Z20822 Contact with and (suspected) exposure to covid-19: Secondary | ICD-10-CM | POA: Diagnosis not present

## 2023-10-22 DIAGNOSIS — Z853 Personal history of malignant neoplasm of breast: Secondary | ICD-10-CM | POA: Insufficient documentation

## 2023-10-22 DIAGNOSIS — M542 Cervicalgia: Secondary | ICD-10-CM | POA: Diagnosis not present

## 2023-10-22 DIAGNOSIS — H538 Other visual disturbances: Secondary | ICD-10-CM | POA: Diagnosis not present

## 2023-10-22 DIAGNOSIS — Z79899 Other long term (current) drug therapy: Secondary | ICD-10-CM | POA: Diagnosis not present

## 2023-10-22 LAB — URINALYSIS, ROUTINE W REFLEX MICROSCOPIC
Bacteria, UA: NONE SEEN
Bilirubin Urine: NEGATIVE
Glucose, UA: NEGATIVE mg/dL
Hgb urine dipstick: NEGATIVE
Ketones, ur: NEGATIVE mg/dL
Nitrite: NEGATIVE
Protein, ur: NEGATIVE mg/dL
Specific Gravity, Urine: 1.027 (ref 1.005–1.030)
pH: 5 (ref 5.0–8.0)

## 2023-10-22 LAB — RESP PANEL BY RT-PCR (RSV, FLU A&B, COVID)  RVPGX2
Influenza A by PCR: NEGATIVE
Influenza B by PCR: NEGATIVE
Resp Syncytial Virus by PCR: NEGATIVE
SARS Coronavirus 2 by RT PCR: NEGATIVE

## 2023-10-22 LAB — BASIC METABOLIC PANEL
Anion gap: 10 (ref 5–15)
BUN: 17 mg/dL (ref 6–20)
CO2: 24 mmol/L (ref 22–32)
Calcium: 9.5 mg/dL (ref 8.9–10.3)
Chloride: 105 mmol/L (ref 98–111)
Creatinine, Ser: 0.75 mg/dL (ref 0.44–1.00)
GFR, Estimated: >60 mL/min (ref >60)
Glucose, Bld: 92 mg/dL (ref 70–99)
Potassium: 4.1 mmol/L (ref 3.5–5.1)
Sodium: 139 mmol/L (ref 135–145)

## 2023-10-22 LAB — CBC
HCT: 33.6 % — ABNORMAL LOW (ref 36.0–46.0)
Hemoglobin: 10.6 g/dL — ABNORMAL LOW (ref 12.0–15.0)
MCH: 30.2 pg (ref 26.0–34.0)
MCHC: 31.5 g/dL (ref 30.0–36.0)
MCV: 95.7 fL (ref 80.0–100.0)
Platelets: 146 10*3/uL — ABNORMAL LOW (ref 150–400)
RBC: 3.51 MIL/uL — ABNORMAL LOW (ref 3.87–5.11)
RDW: 13.2 % (ref 11.5–15.5)
WBC: 2.3 10*3/uL — ABNORMAL LOW (ref 4.0–10.5)
nRBC: 0 % (ref 0.0–0.2)

## 2023-10-22 LAB — CBG MONITORING, ED: Glucose-Capillary: 115 mg/dL — ABNORMAL HIGH (ref 70–99)

## 2023-10-22 MED ORDER — METOCLOPRAMIDE HCL 5 MG/ML IJ SOLN
10.0000 mg | INTRAMUSCULAR | Status: AC
  Administered 2023-10-23: 10 mg via INTRAVENOUS
  Filled 2023-10-22: qty 2

## 2023-10-22 MED ORDER — LORAZEPAM 2 MG/ML IJ SOLN
1.0000 mg | Freq: Once | INTRAMUSCULAR | Status: AC | PRN
  Administered 2023-10-23: 1 mg via INTRAVENOUS
  Filled 2023-10-22: qty 1

## 2023-10-22 MED ORDER — DIPHENHYDRAMINE HCL 50 MG/ML IJ SOLN
25.0000 mg | Freq: Once | INTRAMUSCULAR | Status: AC
  Administered 2023-10-23: 25 mg via INTRAVENOUS
  Filled 2023-10-22: qty 1

## 2023-10-22 MED ORDER — SODIUM CHLORIDE 0.9 % IV BOLUS
1000.0000 mL | Freq: Once | INTRAVENOUS | Status: AC
  Administered 2023-10-23: 1000 mL via INTRAVENOUS

## 2023-10-22 NOTE — ED Provider Notes (Signed)
 [place] EMERGENCY DEPARTMENT AT [facility] Provider Note   CSN: [id number] Arrival date & time: 10/22/23  1921     History  Chief Complaint  Patient presents with   Headache   Neck Pain    Maria Carter is a 53 y.o. female.  The history is provided by the patient and medical records.  Headache Associated symptoms: neck pain   Neck Pain Associated symptoms: headaches    53 y.o. F with history of anxiety, hypothyroidism, depression, breast cancer, presenting to the ED for headache.  States ongoing x2 days, starting yesterday morning upon waking.  Headache described as a pressure but also pulsating along top right side of her head.  She has noticed some associated blurred vision, seems worse in right eye. Vision mostly seems "blurred" and has noticed some black spots in her vision.  No total loss of vision reported.  Some lightheadedness without syncope.  Some nausea without vomiting. Denies fever/chills.  No prior hx of migraine headaches.  No hx of TIA or stroke. Has been taking tylenol at home without relief.  Home Medications Prior to Admission medications   Medication Sig Start Date End Date Taking? Authorizing Provider  busPIRone (BUSPAR) 15 MG tablet Take by mouth. Patient not taking: Reported on 09/03/2023 08/06/22   [provider]  capecitabine (XELODA) 500 MG tablet Take 3 tabs (1500 mg) in AM and 3 tabs (1500 mg) in PM 14 days on, followed by a 7 day rest period (21 day supply). Take within 30 minutes of a meal, 12 hours apart. Patient not taking: Reported on 09/03/2023 08/17/23   [name], MD  ciprofloxacin (CIPRO) 500 MG tablet Take 1 tablet (500 mg total) by mouth 2 (two) times daily. 09/30/23   [name], MD  desvenlafaxine (PRISTIQ) 100 MG 24 hr tablet Take 100 mg by mouth daily. 11/26/21   [provider]  diclofenac Sodium (VOLTAREN) 1 % GEL Apply 0.5 grams (1 fingertip) to each hand and each foot twice daily for up to 12  weeks 09/03/23   [name], MD  Multiple Vitamins-Minerals (MULTIVITAMIN ADULTS PO) Take 1 tablet by mouth daily.    [provider]  ondansetron (ZOFRAN) 8 MG tablet Take 1 tablet (8 mg total) by mouth every 8 (eight) hours as needed for nausea or vomiting. Patient not taking: Reported on 09/03/2023 09/03/23   [name], MD  prochlorperazine (COMPAZINE) 10 MG tablet Take 1 tablet (10 mg total) by mouth every 6 (six) hours as needed for nausea or vomiting. Patient not taking: Reported on 09/03/2023 09/03/23   [name], MD      Allergies    Patient has no known allergies.    Review of Systems   Review of Systems  Musculoskeletal:  Positive for neck pain.  Neurological:  Positive for headaches.  All other systems reviewed and are negative.   Physical Exam Updated Vital Signs BP 115/76   Pulse 64   Temp 98.4 F (36.9 C)   Resp 18   Wt 75.2 kg   LMP  (LMP Unknown)   SpO2 98%   BMI 31.32 kg/m   Physical Exam Vitals and nursing note reviewed.  Constitutional:      Appearance: She is well-developed.  HENT:     Head: Normocephalic and atraumatic.  Eyes:     Conjunctiva/sclera: Conjunctivae normal.     Pupils: Pupils are equal, round, and reactive to light.     Comments: PERRL, EOMs intact, no nystagmus  witnessed Conjunctiva are not injected, no hemorrhage, no drainage, no ptosis of upper lids  Cardiovascular:     Rate and Rhythm: Normal rate and regular rhythm.     Heart sounds: Normal heart sounds.  Pulmonary:     Effort: Pulmonary effort is normal.     Breath sounds: Normal breath sounds.  Abdominal:     General: Bowel sounds are normal.     Palpations: Abdomen is soft.  Musculoskeletal:        General: Normal range of motion.     Cervical back: Normal range of motion.  Skin:    General: Skin is warm and dry.  Neurological:     Mental Status: She is alert and oriented to person, place, and time.     Comments: AAOx3, answering questions and  following commands appropriately; equal strength UE and LE bilaterally; CN grossly intact; moves all extremities appropriately without ataxia; no focal neuro deficits or facial asymmetry appreciated     ED Results / Procedures / Treatments   Labs (all labs ordered are listed, but only abnormal results are displayed) Labs Reviewed  CBC - Abnormal; Notable for the following components:      Result Value   WBC 2.3 (*)    RBC 3.51 (*)    Hemoglobin 10.6 (*)    HCT 33.6 (*)    Platelets 146 (*)    All other components within normal limits  URINALYSIS, ROUTINE W REFLEX MICROSCOPIC - Abnormal; Notable for the following components:   Leukocytes,Ua SMALL (*)    All other components within normal limits  CBG MONITORING, ED - Abnormal; Notable for the following components:   Glucose-Capillary 115 (*)    All other components within normal limits  RESP PANEL BY RT-PCR (RSV, FLU A&B, COVID)  RVPGX2  BASIC METABOLIC PANEL    EKG None  Radiology MR Brain W and Wo Contrast Result Date: 10/23/2023 CLINICAL DATA:  New onset headache EXAM: MRI HEAD WITHOUT AND WITH CONTRAST TECHNIQUE: Multiplanar, multiecho pulse sequences of the brain and surrounding structures were obtained without and with intravenous contrast. CONTRAST:  7.14mL GADAVIST GADOBUTROL 1 MMOL/ML IV SOLN COMPARISON:  None Available. FINDINGS: Brain: No acute infarct, mass effect or extra-axial collection. No acute or chronic hemorrhage. Normal white matter signal, parenchymal volume and CSF spaces. The midline structures are normal. There is no abnormal contrast enhancement. Vascular: Normal flow voids. Skull and upper cervical spine: Normal calvarium and skull base. Visualized upper cervical spine and soft tissues are normal. Sinuses/Orbits:No paranasal sinus fluid levels or advanced mucosal thickening. No mastoid or middle ear effusion. Normal orbits. IMPRESSION: Normal brain MRI. Electronically Signed   By: Deatra Robinson M.D.   On:  10/23/2023 01:04   CT Head Wo Contrast Result Date: 10/22/2023 CLINICAL DATA:  Headache EXAM: CT HEAD WITHOUT CONTRAST TECHNIQUE: Contiguous axial images were obtained from the base of the skull through the vertex without intravenous contrast. RADIATION DOSE REDUCTION: This exam was performed according to the departmental dose-optimization program which includes automated exposure control, adjustment of the mA and/or kV according to patient size and/or use of iterative reconstruction technique. COMPARISON:  None Available. FINDINGS: Brain: No hemorrhage or intracranial mass. Minimal white matter hypodensity adjacent to right frontal horn. Nonenlarged ventricles Vascular: No hyperdense vessels.  No unexpected calcification Skull: Normal. Negative for fracture or focal lesion. Sinuses/Orbits: Mild mucosal thickening in the sinuses Other: None IMPRESSION: 1. Negative for acute intracranial hemorrhage or mass. Minimal white matter hypodensity adjacent to right frontal  horn, nonspecific but could be due to minimal chronic small vessel ischemic change or small infarct of uncertain age. 2. Mild mucosal thickening in the sinuses. Electronically Signed   By: Jasmine Pang M.D.   On: 10/22/2023 21:08    Procedures Procedures    Medications Ordered in ED Medications - No data to display  ED Course/ Medical Decision Making/ A&P                                 Medical Decision Making Amount and/or Complexity of Data Reviewed Labs: ordered. Radiology: ordered and independent interpretation performed. ECG/medicine tests: ordered and independent interpretation performed.  Risk Prescription drug management.   53 year old female senting to the ED with headache.  Mostly top of right side of her head, some pressure and throbbing sensation.  Some nausea and blurred vision without vomiting.  She has never had headaches like this previously.  Does have history of breast cancer, currently receiving chemo.  She  is afebrile and nontoxic in appearance here.  Her neurologic exam is nonfocal.  She does not have any clinical signs or symptoms suggestive of meningitis.  She did have labs obtained from triage--white count is 2.3, baseline over the past few weeks.  Hemoglobin 10.6, also stable as of recent.  No electrolyte derangement.  RVP is negative.  CT head was obtained, no acute mass or hemorrhage, but does have white matter hypodensity in right frontal horn, questionably small vessel ischemic change versus small infarct.  Discussed results with patient, I feel she will need MRI.  Given her history of underlying malignancy, will obtain this with and without contrast.  1:23 AM MRI is normal.  Patient is feeling better after migraine cocktail here.  States her vision has improved as well.  She may be experiencing complex migraine.  She remains hemodynamically stable.  Will refer to outpatient neurology for follow-up, ambulatory referral placed.  Follow-up with PCP in the interim.  Return here for new concerns.  Final Clinical Impression(s) / ED Diagnoses Final diagnoses:  Acute nonintractable headache, unspecified headache type    Rx / DC Orders ED Discharge Orders          Ordered    Ambulatory referral to Neurology       Comments: An appointment is requested in approximately: 8 weeks   10/23/23 0129              Garlon Hatchet, PA-C 10/23/23 0208    Royanne Foots, DO 10/25/23 1205

## 2023-10-22 NOTE — ED Triage Notes (Signed)
Pt in with R sided neck, R head pain and fatigue. States it began yesterday morning when she woke up. Pt also reports R eye blurred vision x 2 days. States she is currently under chemo trx, recently finished course of abx for viral illness. Denies any migraine hx, states Tylenol has not helped

## 2023-10-23 ENCOUNTER — Other Ambulatory Visit: Payer: Self-pay

## 2023-10-23 ENCOUNTER — Emergency Department (HOSPITAL_COMMUNITY): Payer: Medicare HMO

## 2023-10-23 DIAGNOSIS — R519 Headache, unspecified: Secondary | ICD-10-CM | POA: Diagnosis not present

## 2023-10-23 MED ORDER — GADOBUTROL 1 MMOL/ML IV SOLN
7.5000 mL | Freq: Once | INTRAVENOUS | Status: AC | PRN
  Administered 2023-10-23: 7.5 mL via INTRAVENOUS

## 2023-10-23 MED ORDER — HEPARIN SOD (PORK) LOCK FLUSH 100 UNIT/ML IV SOLN
500.0000 [IU] | Freq: Once | INTRAVENOUS | Status: AC
  Administered 2023-10-23: 500 [IU]
  Filled 2023-10-23: qty 5

## 2023-10-23 NOTE — Discharge Instructions (Addendum)
Your MRI today was normal.   I have placed an ambulatory referral to neurology for you.  They should contact you with an appointment.  If you do not hear from them in the next few days, call clinic to follow-up on this. You can follow-up with your primary care doctor in the interim. Return to the ED for new or worsening symptoms.

## 2023-10-27 ENCOUNTER — Inpatient Hospital Stay (HOSPITAL_BASED_OUTPATIENT_CLINIC_OR_DEPARTMENT_OTHER): Payer: Medicare HMO | Admitting: Hematology and Oncology

## 2023-10-27 ENCOUNTER — Inpatient Hospital Stay: Payer: Medicare HMO

## 2023-10-27 ENCOUNTER — Inpatient Hospital Stay: Payer: Medicare HMO | Attending: Physician Assistant

## 2023-10-27 ENCOUNTER — Other Ambulatory Visit: Payer: Self-pay | Admitting: *Deleted

## 2023-10-27 VITALS — BP 104/45 | HR 76 | Temp 98.1°F | Resp 18 | Wt 164.2 lb

## 2023-10-27 DIAGNOSIS — Z1732 Human epidermal growth factor receptor 2 negative status: Secondary | ICD-10-CM | POA: Diagnosis not present

## 2023-10-27 DIAGNOSIS — R42 Dizziness and giddiness: Secondary | ICD-10-CM | POA: Insufficient documentation

## 2023-10-27 DIAGNOSIS — Z923 Personal history of irradiation: Secondary | ICD-10-CM | POA: Insufficient documentation

## 2023-10-27 DIAGNOSIS — R519 Headache, unspecified: Secondary | ICD-10-CM | POA: Insufficient documentation

## 2023-10-27 DIAGNOSIS — C50411 Malignant neoplasm of upper-outer quadrant of right female breast: Secondary | ICD-10-CM

## 2023-10-27 DIAGNOSIS — H538 Other visual disturbances: Secondary | ICD-10-CM | POA: Insufficient documentation

## 2023-10-27 DIAGNOSIS — Z1722 Progesterone receptor negative status: Secondary | ICD-10-CM | POA: Insufficient documentation

## 2023-10-27 DIAGNOSIS — Z170 Estrogen receptor positive status [ER+]: Secondary | ICD-10-CM | POA: Diagnosis not present

## 2023-10-27 DIAGNOSIS — Z95828 Presence of other vascular implants and grafts: Secondary | ICD-10-CM

## 2023-10-27 LAB — CBC WITH DIFFERENTIAL (CANCER CENTER ONLY)
Abs Immature Granulocytes: 0 10*3/uL (ref 0.00–0.07)
Basophils Absolute: 0 10*3/uL (ref 0.0–0.1)
Basophils Relative: 1 %
Eosinophils Absolute: 0 10*3/uL (ref 0.0–0.5)
Eosinophils Relative: 1 %
HCT: 33.5 % — ABNORMAL LOW (ref 36.0–46.0)
Hemoglobin: 11 g/dL — ABNORMAL LOW (ref 12.0–15.0)
Immature Granulocytes: 0 %
Lymphocytes Relative: 21 %
Lymphs Abs: 0.4 10*3/uL — ABNORMAL LOW (ref 0.7–4.0)
MCH: 30.1 pg (ref 26.0–34.0)
MCHC: 32.8 g/dL (ref 30.0–36.0)
MCV: 91.8 fL (ref 80.0–100.0)
Monocytes Absolute: 0.2 10*3/uL (ref 0.1–1.0)
Monocytes Relative: 10 %
Neutro Abs: 1.4 10*3/uL — ABNORMAL LOW (ref 1.7–7.7)
Neutrophils Relative %: 67 %
Platelet Count: 158 10*3/uL (ref 150–400)
RBC: 3.65 MIL/uL — ABNORMAL LOW (ref 3.87–5.11)
RDW: 12.8 % (ref 11.5–15.5)
WBC Count: 2.1 10*3/uL — ABNORMAL LOW (ref 4.0–10.5)
nRBC: 0 % (ref 0.0–0.2)

## 2023-10-27 LAB — CMP (CANCER CENTER ONLY)
ALT: 12 U/L (ref 0–44)
AST: 19 U/L (ref 15–41)
Albumin: 4.3 g/dL (ref 3.5–5.0)
Alkaline Phosphatase: 79 U/L (ref 38–126)
Anion gap: 4 — ABNORMAL LOW (ref 5–15)
BUN: 12 mg/dL (ref 6–20)
CO2: 31 mmol/L (ref 22–32)
Calcium: 9.6 mg/dL (ref 8.9–10.3)
Chloride: 103 mmol/L (ref 98–111)
Creatinine: 0.88 mg/dL (ref 0.44–1.00)
GFR, Estimated: >60 mL/min (ref >60)
Glucose, Bld: 111 mg/dL — ABNORMAL HIGH (ref 70–99)
Potassium: 4 mmol/L (ref 3.5–5.1)
Sodium: 138 mmol/L (ref 135–145)
Total Bilirubin: 0.3 mg/dL (ref 0.0–1.2)
Total Protein: 7.8 g/dL (ref 6.5–8.1)

## 2023-10-27 LAB — PREGNANCY, URINE: Preg Test, Ur: NEGATIVE

## 2023-10-27 MED ORDER — SODIUM CHLORIDE 0.9% FLUSH
10.0000 mL | Freq: Once | INTRAVENOUS | Status: AC
  Administered 2023-10-27: 10 mL

## 2023-10-27 MED ORDER — METHYLPREDNISOLONE 4 MG PO TBPK: ORAL_TABLET | ORAL | 0 refills | Status: DC

## 2023-10-27 NOTE — Assessment & Plan Note (Signed)
 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  Residual cancer burden was rated at RCB II.  Repeat prognostics showed ER 40% weak staining positive -Continue Keytruda immunotherapy. RCB II -Consider oral chemotherapy with Xeloda after completion of radiation therapy, based on phase two studies suggesting potential benefit in patients without complete pathologic response. She hasn't been able to take the pill, she tried it for a few days, says she didn't feel good. 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 -She completed adj radiation Dec 3 rd.  She tried Xeloda for a few days and did not tolerate it well.  Since then she has had intermittent nausea, vomiting, unspecified viral infection, UTI and most recently had another ER visit with headache, pain on right side of the face, blurred vision, presyncope like symptoms.  And she has not gone back on adjuvant Xeloda.  Facial Pain New onset unilateral facial pain. Differential includes trigeminal neuralgia. No imaging evidence of malignancy. -Start Medrol Dosepak to manage potential trigeminal neuralgia. -If no relief, consider referral to neurology.  Blurred Vision New onset bilateral blurred vision. Unclear etiology. -She is able to see better when she closes one eye. -Discussed headaches with ophthalmology as well.  Postural Lightheadedness Reports feeling like she might pass out, particularly when standing up or moving around. This may be related  to her reported low blood pressure. -Monitor blood pressure and symptoms. -Hold immunotherapy  Immunotherapy Patient has been receiving immunotherapy for cancer, but has been experiencing multiple symptoms. -Hold immunotherapy temporarily to assess if symptoms improve.  Follow-up in 2 weeks to reassess symptoms and response to Medrol Dosepak.

## 2023-10-27 NOTE — Progress Notes (Signed)
 [facility] Cancer Follow up:    Maria Shackleton, NP-C [address] [place] [address]   DIAGNOSIS:  Cancer Staging  Malignant neoplasm of upper-outer quadrant of right female breast [facility]) Staging form: Breast, AJCC 8th Edition - Clinical: Stage IIB (cT2, cN0, cM0, G3, ER+, PR-, HER2-) - Signed by [name], MD on 10/08/2022 Stage prefix: Initial diagnosis Histologic grading system: 3 grade system    SUMMARY OF ONCOLOGIC HISTORY: Oncology History  Malignant neoplasm of upper-outer quadrant of right female breast (HCC)  09/26/2022 Mammogram   Patient had a palpable right breast mass and hence underwent bilateral diagnostic mammogram.  This confirmed a suspicious 3 cm right upper outer quadrant mass and an abnormal right axillary lymph node with cortical thickening.  Tissue sampling of both the breast mass and axillary lymph node were recommended.  Left breast was normal.   09/26/2022 Breast US   Ultrasound breast confirmed the above-mentioned findings.   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  Prognostic showed ER 50% positive weak staining PR 0% negative HER2 1+ by IHC and Ki-67 of 70%   10/08/2022 Cancer Staging   Staging form: Breast, AJCC 8th Edition - Clinical: Stage IIB (cT2, cN0, cM0, G3, ER+, PR-, HER2-) - Signed by [name], MD on 10/08/2022 Stage prefix: Initial diagnosis Histologic grading system: 3 grade system   10/24/2022 - 11/14/2022 Chemotherapy   Patient is on Treatment Plan : BREAST Pembrolizumab (200) D1 + Carboplatin (5) D1 + Paclitaxel (80) D1,8,15 q21d X 4 cycles / Pembrolizumab (200) D1 + AC D1 q21d x 4 cycles      Genetic Testing   [facility] Common Cancer Panel+RNA was Negative. Report date is 10/16/2022.  The Common Hereditary Cancers Panel offered by [facility] includes sequencing and/or deletion duplication testing of the following 48 genes: APC, ATM, AXIN2, BAP1,  BARD1, BMPR1A, BRCA1, BRCA2, BRIP1, CDH1, CDK4, CDKN2A (p14ARF and p16INK4a only), CHEK2, CTNNA1, DICER1, EPCAM (Deletion/duplication testing only), FH, GREM1 (promoter region duplication testing only), HOXB13, KIT, MBD4, MEN1, MLH1, MSH2, MSH3, MSH6, MUTYH, NF1, NHTL1, PALB2, PDGFRA, PMS2, POLD1, POLE, PTEN, RAD51C, RAD51D, SDHA (sequencing analysis only except exon 14), SDHB, SDHC, SDHD, SMAD4, SMARCA4. STK11, TP53, TSC1, TSC2, and VHL.   11/21/2022 - 04/08/2023 Chemotherapy   Patient is on Treatment Plan : BREAST Pembrolizumab (200) D1 + Carboplatin (1.5) D1,8,15 + Paclitaxel (80) D1,8,15 q21d X 4 cycles / Pembrolizumab (200) D1 + AC D1 q21d x 4 cycles     05/19/2023 -  Chemotherapy   Patient is on Treatment Plan : BREAST Pembrolizumab (200) q21d x 27 weeks     05/21/2023 Surgery   Right lumpectomy: metaplastic IDC with chondroid differentiation, 1.8 cm, grade 3, margins negative, no LVI, 8 SLN negative, repeat prognostic panel: ER 40% positive, weak staining, PR 0% negative, Ki-67 80%, HER2 negative (0). RCB-II   07/06/2023 - 08/04/2023 Radiation Therapy   Adjuvant radiation therapy     CURRENT THERAPY:  INTERVAL HISTORY: Maria Carter 53 y.o. female returns for follow up while on immunotherapy and xeloda. Discussed the use of AI scribe software for clinical note transcription with the patient, who gave verbal consent to proceed.  History of Present Illness    Maria Carter is a 53 year old female who presents with facial pain and blurred vision.  She experiences significant unilateral facial pain, distinct from previous headaches, with associated photophobia and phonophobia. The pain is severe, raising  concerns about a possible stroke or heart attack, and is a new symptom for her.  She reports intermittent blurred vision in both eyes, sometimes feeling as if there is a foreign body present. This affects daily activities, such as driving, and is accompanied by presyncope. The blurred  vision is a new symptom post-cancer treatment.  She feels exhausted and tired, with episodes of lightheadedness, particularly when standing or moving around, which she attributes to her history of hypotension. Her eating habits remain stable, and there is no recent weight loss.  Her past medical history includes cancer treatment with chemotherapy and immunotherapy. She also mentions a recent viral infection, possibly the flu, which she believes may have contributed to her current symptoms. An MRI of the brain in February 2025 was normal after visiting the ER for her symptoms.  Rest of the pertinent 10 point ROS reviewed and negative  Patient Active Problem List   Diagnosis Date Noted   Port-A-Cath in place 10/24/2022   Genetic testing 10/16/2022   Malignant neoplasm of upper-outer quadrant of right female breast (HCC) 10/06/2022   Thrombocytopenia (HCC) 03/05/2022   Leukopenia 03/05/2022   Hot flashes 02/28/2022   Hair thinning 02/28/2022   Amenorrhea 02/28/2022   Screening for colon cancer 02/28/2022   Multiple nevi 02/28/2022   PTSD (post-traumatic stress disorder) 02/08/2021   Mild episode of recurrent major depressive disorder (HCC) 09/12/2013   Attention deficit disorder 04/25/2013   Migraine 02/05/2013   KELOID SCAR 05/09/2010   URINALYSIS, ABNORMAL 05/09/2010   Anxiety state 03/01/2010   DEPRESSION 03/01/2010   Headache 03/01/2010   Intermittent chest pain 03/01/2010    has no known allergies.  MEDICAL HISTORY: Past Medical History:  Diagnosis Date   Anxiety    Breast cancer (HCC)    right breast IDC   Depression    Hx of febrile seizure    1980   Hypothyroidism     SURGICAL HISTORY: Past Surgical History:  Procedure Laterality Date   BREAST BIOPSY Right 09/30/2022   [place] RT BREAST BX W LOC DEV 1ST LESION IMG BX SPEC US GUIDE 09/30/2022 [facility]   BREAST BIOPSY  05/20/2023   MM RT RADIOACTIVE SEED LOC MAMMO GUIDE 05/20/2023 [facility] MAMMO[facility]   BREAST  LUMPECTOMY WITH RADIOACTIVE SEED AND SENTINEL LYMPH NODE BIOPSY Right 05/21/2023   Procedure: RIGHT BREAST LUMPECTOMY WITH RADIOACTIVE SEED AND SENTINEL LYMPH NODE BIOPSY;  Surgeon: [name], MD;  Location: [facility];  Service: General;  Laterality: Right;  LMA PEC BLOCK   EXCISION OF KELOID N/A 05/21/2023   Procedure: EXCISION CHEST WALL SCAR;  Surgeon: [name], MD;  Location: [facility];  Service: General;  Laterality: N/A;   PORTACATH PLACEMENT N/A 10/23/2022   Procedure: INSERTION PORT-A-CATH;  Surgeon: [name], MD;  Location: [facility];  Service: General;  Laterality: N/A;    SOCIAL HISTORY: Social History   Socioeconomic History   Marital status: Single    Spouse name: Not on file   Number of children: Not on file   Years of education: Not on file   Highest education level: Not on file  Occupational History   Not on file  Tobacco Use   Smoking status: Never   Smokeless tobacco: Never  Vaping Use   Vaping status: Never Used  Substance and Sexual Activity   Alcohol use: Not Currently    Comment: social   Drug use: No   Sexual activity: Yes    Partners: Male  Birth control/protection: None  Other Topics Concern   Not on file  Social History Narrative   Not on file   Social Drivers of Health   Financial Resource Strain: High Risk (10/08/2022)   Overall Financial Resource Strain (CARDIA)    Difficulty of Paying Living Expenses: Hard  Food Insecurity: Food Insecurity Present (09/03/2023)   Hunger Vital Sign    Worried About Running Out of Food in the Last Year: Sometimes true    Ran Out of Food in the Last Year: Sometimes true  Transportation Needs: No Transportation Needs (10/08/2022)   PRAPARE - Administrator, Civil Service (Medical): No    Lack of Transportation (Non-Medical): No  Physical Activity: Not on file  Stress: Not on file  Social Connections: Not on file  Intimate Partner  Violence: Not on file    FAMILY HISTORY: Family History  Problem Relation Age of Onset   Breast cancer Sister 37 - 28   Cancer Paternal Grandmother        unknown type    Review of Systems  Constitutional:  Negative for appetite change, chills, fatigue, fever and unexpected weight change.  HENT:   Negative for hearing loss, lump/mass and trouble swallowing.   Eyes:  Negative for eye problems and icterus.  Respiratory:  Negative for chest tightness, cough and shortness of breath.   Cardiovascular:  Negative for chest pain, leg swelling and palpitations.  Gastrointestinal:  Negative for abdominal distention, abdominal pain, constipation, diarrhea, nausea and vomiting.  Endocrine: Negative for hot flashes.  Genitourinary:  Negative for difficulty urinating.   Musculoskeletal:  Negative for arthralgias.  Skin:  Negative for itching and rash.  Neurological:  Negative for dizziness, extremity weakness, headaches and numbness.  Hematological:  Negative for adenopathy. Does not bruise/bleed easily.  Psychiatric/Behavioral:  Negative for depression. The patient is not nervous/anxious.       PHYSICAL EXAMINATION    Vitals:   10/27/23 1408  BP: (!) 104/45  Pulse: 76  Resp: 18  Temp: 98.1 F (36.7 C)  SpO2: 97%    Physical Exam Constitutional:      General: She is not in acute distress.    Appearance: Normal appearance. She is not toxic-appearing.  HENT:     Head: Normocephalic and atraumatic.     Mouth/Throat:     Mouth: Mucous membranes are moist.     Pharynx: Oropharynx is clear. No oropharyngeal exudate or posterior oropharyngeal erythema.  Eyes:     General: No scleral icterus. Cardiovascular:     Rate and Rhythm: Normal rate and regular rhythm.     Pulses: Normal pulses.     Heart sounds: Normal heart sounds.  Pulmonary:     Effort: Pulmonary effort is normal.     Breath sounds: Normal breath sounds.  Abdominal:     General: Abdomen is flat. Bowel sounds are  normal. There is no distension.     Palpations: Abdomen is soft.     Tenderness: There is no abdominal tenderness.  Musculoskeletal:        General: No swelling.     Cervical back: Neck supple.  Lymphadenopathy:     Cervical: No cervical adenopathy.  Skin:    General: Skin is warm and dry.     Findings: No rash.  Neurological:     General: No focal deficit present.     Mental Status: She is alert.  Psychiatric:        Mood and Affect: Mood normal.  Behavior: Behavior normal.     LABORATORY DATA:  CBC    Component Value Date/Time   WBC 2.1 (L) 10/27/2023 1351   WBC 2.3 (L) 10/22/2023 2122   RBC 3.65 (L) 10/27/2023 1351   HGB 11.0 (L) 10/27/2023 1351   HCT 33.5 (L) 10/27/2023 1351   PLT 158 10/27/2023 1351   MCV 91.8 10/27/2023 1351   MCH 30.1 10/27/2023 1351   MCHC 32.8 10/27/2023 1351   RDW 12.8 10/27/2023 1351   LYMPHSABS 0.4 (L) 10/27/2023 1351   MONOABS 0.2 10/27/2023 1351   EOSABS 0.0 10/27/2023 1351   BASOSABS 0.0 10/27/2023 1351    CMP     Component Value Date/Time   NA 139 10/22/2023 2122   K 4.1 10/22/2023 2122   CL 105 10/22/2023 2122   CO2 24 10/22/2023 2122   GLUCOSE 92 10/22/2023 2122   BUN 17 10/22/2023 2122   CREATININE 0.75 10/22/2023 2122   CREATININE 0.86 09/28/2023 0938   CALCIUM 9.5 10/22/2023 2122   PROT 7.3 10/03/2023 0042   ALBUMIN 3.8 10/03/2023 0042   AST 19 10/03/2023 0042   AST 19 09/28/2023 0938   ALT 14 10/03/2023 0042   ALT 14 09/28/2023 0938   ALKPHOS 86 10/03/2023 0042   BILITOT 0.7 10/03/2023 0042   BILITOT 0.5 09/28/2023 0938   GFRNONAA >60 10/22/2023 2122   GFRNONAA >60 09/28/2023 0938   GFRAA >60 12/23/2015 1151        ASSESSMENT and THERAPY PLAN:   Malignant neoplasm of upper-outer quadrant of right female breast (HCC) Breast Cancer  Maria Carter is a 53 year old woman with 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  Residual cancer burden was rated at RCB II.  Repeat prognostics showed ER 40% weak staining positive -Continue Keytruda immunotherapy. RCB II -Consider oral chemotherapy with Xeloda after completion of radiation therapy, based on phase two studies suggesting potential benefit in patients without complete pathologic response. She hasn't been able to take the pill, she tried it for a few days, says she didn't feel good. 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 -She completed adj radiation Dec 3 rd.  She tried Xeloda for a few days and did not tolerate it well.  Since then she has had intermittent nausea, vomiting, unspecified viral infection, UTI and most recently had another ER visit with headache, pain on right side of the face, blurred vision, presyncope like symptoms.  And she has not gone back on adjuvant Xeloda.  Facial Pain New onset unilateral facial pain. Differential includes trigeminal neuralgia. No imaging evidence of malignancy. -Start Medrol Dosepak to manage potential trigeminal neuralgia. -If no relief, consider referral to neurology.  Blurred Vision New onset bilateral blurred vision. Unclear etiology. -She is able to see better when she closes one eye. -Discussed headaches with ophthalmology as well.  Postural Lightheadedness Reports feeling like she might pass out, particularly when standing up or moving around. This may be related to her reported low blood pressure. -Monitor blood pressure and symptoms. -Hold immunotherapy  Immunotherapy Patient has been receiving immunotherapy for cancer, but has been  experiencing multiple  symptoms. -Hold immunotherapy temporarily to assess if symptoms improve.  Follow-up in 2 weeks to reassess symptoms and response to Medrol Dosepak.    All questions were answered. The patient knows to call the clinic with any problems, questions or concerns. We can certainly see the patient much sooner if necessary.

## 2023-10-28 ENCOUNTER — Telehealth: Payer: Self-pay | Admitting: Hematology and Oncology

## 2023-10-28 ENCOUNTER — Telehealth: Payer: Self-pay | Admitting: *Deleted

## 2023-10-28 NOTE — Telephone Encounter (Signed)
 Per MD request obtained appt for optometry at [facility] for 2/27 at 345 pm.  Informed pt of above with office information - in case she needed to reschedule due to transportation issues.

## 2023-10-28 NOTE — Telephone Encounter (Signed)
 Spoke with patient confirming upcoming appointment

## 2023-10-29 DIAGNOSIS — H5213 Myopia, bilateral: Secondary | ICD-10-CM | POA: Diagnosis not present

## 2023-10-29 DIAGNOSIS — H16143 Punctate keratitis, bilateral: Secondary | ICD-10-CM | POA: Diagnosis not present

## 2023-10-29 DIAGNOSIS — H524 Presbyopia: Secondary | ICD-10-CM | POA: Diagnosis not present

## 2023-10-29 DIAGNOSIS — H52223 Regular astigmatism, bilateral: Secondary | ICD-10-CM | POA: Diagnosis not present

## 2023-10-29 DIAGNOSIS — H2513 Age-related nuclear cataract, bilateral: Secondary | ICD-10-CM | POA: Diagnosis not present

## 2023-11-04 ENCOUNTER — Other Ambulatory Visit: Payer: Self-pay

## 2023-11-09 ENCOUNTER — Ambulatory Visit: Payer: Medicare HMO

## 2023-11-09 ENCOUNTER — Other Ambulatory Visit: Payer: Medicare HMO

## 2023-11-09 ENCOUNTER — Ambulatory Visit: Payer: Medicare HMO | Admitting: Hematology and Oncology

## 2023-11-10 ENCOUNTER — Other Ambulatory Visit: Payer: Self-pay | Admitting: Hematology and Oncology

## 2023-11-10 DIAGNOSIS — Z853 Personal history of malignant neoplasm of breast: Secondary | ICD-10-CM

## 2023-11-10 NOTE — Progress Notes (Unsigned)
 [facility] Cancer Follow up:    Maria Shackleton, NP-C [address] [place] [address]   DIAGNOSIS:  Cancer Staging  Malignant neoplasm of upper-outer quadrant of right female breast [facility]) Staging form: Breast, AJCC 8th Edition - Clinical: Stage IIB (cT2, cN0, cM0, G3, ER+, PR-, HER2-) - Signed by [name], MD on 10/08/2022 Stage prefix: Initial diagnosis Histologic grading system: 3 grade system    SUMMARY OF ONCOLOGIC HISTORY: Oncology History  Malignant neoplasm of upper-outer quadrant of right female breast (HCC)  09/26/2022 Mammogram   Patient had a palpable right breast mass and hence underwent bilateral diagnostic mammogram.  This confirmed a suspicious 3 cm right upper outer quadrant mass and an abnormal right axillary lymph node with cortical thickening.  Tissue sampling of both the breast mass and axillary lymph node were recommended.  Left breast was normal.   09/26/2022 Breast US   Ultrasound breast confirmed the above-mentioned findings.   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  Prognostic showed ER 50% positive weak staining PR 0% negative HER2 1+ by IHC and Ki-67 of 70%   10/08/2022 Cancer Staging   Staging form: Breast, AJCC 8th Edition - Clinical: Stage IIB (cT2, cN0, cM0, G3, ER+, PR-, HER2-) - Signed by [name], MD on 10/08/2022 Stage prefix: Initial diagnosis Histologic grading system: 3 grade system   10/24/2022 - 11/14/2022 Chemotherapy   Patient is on Treatment Plan : BREAST Pembrolizumab (200) D1 + Carboplatin (5) D1 + Paclitaxel (80) D1,8,15 q21d X 4 cycles / Pembrolizumab (200) D1 + AC D1 q21d x 4 cycles      Genetic Testing   [facility] Common Cancer Panel+RNA was Negative. Report date is 10/16/2022.  The Common Hereditary Cancers Panel offered by [facility] includes sequencing and/or deletion duplication testing of the following 48 genes: APC, ATM, AXIN2, BAP1,  BARD1, BMPR1A, BRCA1, BRCA2, BRIP1, CDH1, CDK4, CDKN2A (p14ARF and p16INK4a only), CHEK2, CTNNA1, DICER1, EPCAM (Deletion/duplication testing only), FH, GREM1 (promoter region duplication testing only), HOXB13, KIT, MBD4, MEN1, MLH1, MSH2, MSH3, MSH6, MUTYH, NF1, NHTL1, PALB2, PDGFRA, PMS2, POLD1, POLE, PTEN, RAD51C, RAD51D, SDHA (sequencing analysis only except exon 14), SDHB, SDHC, SDHD, SMAD4, SMARCA4. STK11, TP53, TSC1, TSC2, and VHL.   11/21/2022 - 04/08/2023 Chemotherapy   Patient is on Treatment Plan : BREAST Pembrolizumab (200) D1 + Carboplatin (1.5) D1,8,15 + Paclitaxel (80) D1,8,15 q21d X 4 cycles / Pembrolizumab (200) D1 + AC D1 q21d x 4 cycles     05/19/2023 -  Chemotherapy   Patient is on Treatment Plan : BREAST Pembrolizumab (200) q21d x 27 weeks     05/21/2023 Surgery   Right lumpectomy: metaplastic IDC with chondroid differentiation, 1.8 cm, grade 3, margins negative, no LVI, 8 SLN negative, repeat prognostic panel: ER 40% positive, weak staining, PR 0% negative, Ki-67 80%, HER2 negative (0). RCB-II   07/06/2023 - 08/04/2023 Radiation Therapy   Adjuvant radiation therapy     CURRENT THERAPY:  INTERVAL HISTORY: Maria Carter 53 y.o. female returns for follow up while on immunotherapy and xeloda.  Discussed the use of AI scribe software for clinical note transcription with the patient, who gave verbal consent to proceed.  History of Present Illness    The patient is a 53 year old with breast cancer who presents with a lump on the neck and worsening anxiety and depression.  She discovered a lump on the back of her neck a few days  ago and is concerned about its nature. Additionally, she has noticed a smaller lump under her chin. She has not experienced any recent illnesses but recalls having a viral infection over a month ago.  She is experiencing worsening anxiety and depression, which are impacting her sleep and daily activities. She feels lightheaded, especially when shopping,  and has recorded low blood pressure during these episodes. This has led her to use a motorized cart for shopping, which she finds distressing. She is not currently on any new medications for these conditions but is considering discussing this with her primary care physician.  She is experiencing hot flashes, mainly at night, and her menstrual cycles have ceased since undergoing chemotherapy. Previously informed she was in premenopause, she is now experiencing symptoms consistent with menopause.  She has ongoing facial pain, which has improved but still presents as pressure. She has seen an eye doctor due to blurred vision and was advised to use ointment for dry eyes and reading glasses.  She is concerned about her overall health and has scheduled a mammogram and plans to see her primary care physician for further evaluation, including a potential colonoscopy.  Rest of the pertinent 10 point ROS reviewed and negative  Patient Active Problem List   Diagnosis Date Noted   Port-A-Cath in place 10/24/2022   Genetic testing 10/16/2022   Malignant neoplasm of upper-outer quadrant of right female breast (HCC) 10/06/2022   Thrombocytopenia (HCC) 03/05/2022   Leukopenia 03/05/2022   Hot flashes 02/28/2022   Hair thinning 02/28/2022   Amenorrhea 02/28/2022   Screening for colon cancer 02/28/2022   Multiple nevi 02/28/2022   PTSD (post-traumatic stress disorder) 02/08/2021   Mild episode of recurrent major depressive disorder (HCC) 09/12/2013   Attention deficit disorder 04/25/2013   Migraine 02/05/2013   KELOID SCAR 05/09/2010   URINALYSIS, ABNORMAL 05/09/2010   Anxiety state 03/01/2010   DEPRESSION 03/01/2010   Headache 03/01/2010   Intermittent chest pain 03/01/2010    has no known allergies.  MEDICAL HISTORY: Past Medical History:  Diagnosis Date   Anxiety    Breast cancer (HCC)    right breast IDC   Depression    Hx of febrile seizure    1980   Hypothyroidism     SURGICAL  HISTORY: Past Surgical History:  Procedure Laterality Date   BREAST BIOPSY Right 09/30/2022   [place] RT BREAST BX W LOC DEV 1ST LESION IMG BX SPEC US GUIDE 09/30/2022 [facility] MAMMOGRAPHY   BREAST BIOPSY  05/20/2023   MM RT RADIOACTIVE SEED LOC MAMMO GUIDE 05/20/2023 [facility] MAMMOGRAPHY   BREAST LUMPECTOMY WITH RADIOACTIVE SEED AND SENTINEL LYMPH NODE BIOPSY Right 05/21/2023   Procedure: RIGHT BREAST LUMPECTOMY WITH RADIOACTIVE SEED AND SENTINEL LYMPH NODE BIOPSY;  Surgeon: [name], MD;  Location: [facility];  Service: General;  Laterality: Right;  LMA PEC BLOCK   EXCISION OF KELOID N/A 05/21/2023   Procedure: EXCISION CHEST WALL SCAR;  Surgeon: [name], MD;  Location: [facility];  Service: General;  Laterality: N/A;   PORTACATH PLACEMENT N/A 10/23/2022   Procedure: INSERTION PORT-A-CATH;  Surgeon: [name], MD;  Location: [facility];  Service: General;  Laterality: N/A;    SOCIAL HISTORY: Social History   Socioeconomic History   Marital status: Single    Spouse name: Not on file   Number of children: Not on file   Years of education: Not on file   Highest education level: Not on file  Occupational History  Not on file  Tobacco Use   Smoking status: Never   Smokeless tobacco: Never  Vaping Use   Vaping status: Never Used  Substance and Sexual Activity   Alcohol use: Not Currently    Comment: social   Drug use: No   Sexual activity: Yes    Partners: Male    Birth control/protection: None  Other Topics Concern   Not on file  Social History Narrative   Not on file   Social Drivers of Health   Financial Resource Strain: High Risk (10/08/2022)   Overall Financial Resource Strain (CARDIA)    Difficulty of Paying Living Expenses: Hard  Food Insecurity: Food Insecurity Present (09/03/2023)   Hunger Vital Sign    Worried About Running Out of Food in the Last Year: Sometimes true    Ran Out of Food in the Last Year:  Sometimes true  Transportation Needs: No Transportation Needs (10/08/2022)   PRAPARE - Administrator, Civil Service (Medical): No    Lack of Transportation (Non-Medical): No  Physical Activity: Not on file  Stress: Not on file  Social Connections: Not on file  Intimate Partner Violence: Not on file    FAMILY HISTORY: Family History  Problem Relation Age of Onset   Breast cancer Sister 17 - 69   Cancer Paternal Grandmother        unknown type    Review of Systems  Constitutional:  Negative for appetite change, chills, fatigue, fever and unexpected weight change.  HENT:   Negative for hearing loss, lump/mass and trouble swallowing.   Eyes:  Negative for eye problems and icterus.  Respiratory:  Negative for chest tightness, cough and shortness of breath.   Cardiovascular:  Negative for chest pain, leg swelling and palpitations.  Gastrointestinal:  Negative for abdominal distention, abdominal pain, constipation, diarrhea, nausea and vomiting.  Endocrine: Negative for hot flashes.  Genitourinary:  Negative for difficulty urinating.   Musculoskeletal:  Negative for arthralgias.  Skin:  Negative for itching and rash.  Neurological:  Negative for dizziness, extremity weakness, headaches and numbness.  Hematological:  Negative for adenopathy. Does not bruise/bleed easily.  Psychiatric/Behavioral:  Negative for depression. The patient is not nervous/anxious.       PHYSICAL EXAMINATION    Vitals:   11/11/23 1303  BP: (!) 115/50  Pulse: 72  Resp: 16  Temp: 98.4 F (36.9 C)  SpO2: 100%     Physical Exam Constitutional:      General: She is not in acute distress.    Appearance: Normal appearance. She is not toxic-appearing.  HENT:     Head: Normocephalic and atraumatic.     Mouth/Throat:     Mouth: Mucous membranes are moist.     Pharynx: Oropharynx is clear. No oropharyngeal exudate or posterior oropharyngeal erythema.  Eyes:     General: No scleral  icterus. Cardiovascular:     Rate and Rhythm: Normal rate and regular rhythm.     Pulses: Normal pulses.     Heart sounds: Normal heart sounds.  Pulmonary:     Effort: Pulmonary effort is normal.     Breath sounds: Normal breath sounds.  Abdominal:     General: Abdomen is flat. Bowel sounds are normal. There is no distension.     Palpations: Abdomen is soft.     Tenderness: There is no abdominal tenderness.  Musculoskeletal:        General: No swelling.     Cervical back: Neck supple.  Lymphadenopathy:  Cervical: Cervical adenopathy (small mobile post cervical LN and Submental LN noted. No other adenopathy) present.  Skin:    General: Skin is warm and dry.     Findings: No rash.  Neurological:     General: No focal deficit present.     Mental Status: She is alert.  Psychiatric:        Mood and Affect: Mood normal.        Behavior: Behavior normal.     LABORATORY DATA:  CBC    Component Value Date/Time   WBC 2.2 (L) 11/11/2023 1237   WBC 2.3 (L) 10/22/2023 2122   RBC 3.49 (L) 11/11/2023 1237   HGB 10.5 (L) 11/11/2023 1237   HCT 31.5 (L) 11/11/2023 1237   PLT 132 (L) 11/11/2023 1237   MCV 90.3 11/11/2023 1237   MCH 30.1 11/11/2023 1237   MCHC 33.3 11/11/2023 1237   RDW 12.5 11/11/2023 1237   LYMPHSABS 0.4 (L) 11/11/2023 1237   MONOABS 0.2 11/11/2023 1237   EOSABS 0.0 11/11/2023 1237   BASOSABS 0.0 11/11/2023 1237    CMP     Component Value Date/Time   NA 136 11/11/2023 1237   K 3.8 11/11/2023 1237   CL 102 11/11/2023 1237   CO2 30 11/11/2023 1237   GLUCOSE 106 (H) 11/11/2023 1237   BUN 16 11/11/2023 1237   CREATININE 0.85 11/11/2023 1237   CALCIUM 9.3 11/11/2023 1237   PROT 7.4 11/11/2023 1237   ALBUMIN 4.3 11/11/2023 1237   AST 17 11/11/2023 1237   ALT 11 11/11/2023 1237   ALKPHOS 79 11/11/2023 1237   BILITOT 0.4 11/11/2023 1237   GFRNONAA >60 11/11/2023 1237   GFRAA >60 12/23/2015 1151        ASSESSMENT and THERAPY PLAN:   Malignant  neoplasm of upper-outer quadrant of right female breast (HCC) Breast cancer  Discontinued immunotherapy and Xeloda due to side effects. Uncertain impact of incomplete immunotherapy cycles. Xeloda not essential for all. - Discontinue remaining cycles of immunotherapy. - Provide information about Guardant Reveal for MRD testing - Perform blood test every six months to monitor for cancer recurrence.  Lymphadenopathy Lymph nodes palpable, not indicative of metastatic breast cancer. Likely reactive lymphadenopathy from past viral infection. - Monitor lymph nodes for two weeks. - If lymph nodes persist or increase, order a full body PET scan.  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 - Discuss menopausal symptoms with primary care physician for potential management options.  Anxiety and depression Worsening symptoms affecting daily activities and sleep. - Discuss anxiety and depression with primary care physician for potential management options.  Hypotension Episodes of lightheadedness and low blood pressure affecting daily activities. - Monitor blood pressure and discuss with primary care physician for further evaluation.  Vision changes Blurred vision, diagnosed with dry eyes. No concerning findings from ophthalmologist. - Use prescribed ointment for dry eyes. - Use reading glasses as needed.  Follow-up Scheduled for mammogram and primary care consultation for further evaluations. - Perform mammogram on March 25. - Consult with primary care physician regarding colonoscopy and other health concerns. - Schedule follow-up appointment in two weeks to reassess lymphadenopathy.     All questions were answered. The patient knows to call the clinic with any problems, questions or concerns. We can certainly see the patient much sooner if necessary.

## 2023-11-11 ENCOUNTER — Other Ambulatory Visit: Payer: Self-pay

## 2023-11-11 ENCOUNTER — Inpatient Hospital Stay: Payer: Medicare HMO

## 2023-11-11 ENCOUNTER — Inpatient Hospital Stay (HOSPITAL_BASED_OUTPATIENT_CLINIC_OR_DEPARTMENT_OTHER): Payer: Medicare HMO | Admitting: Hematology and Oncology

## 2023-11-11 ENCOUNTER — Inpatient Hospital Stay: Payer: Medicare HMO | Attending: Physician Assistant

## 2023-11-11 VITALS — BP 115/50 | HR 72 | Temp 98.4°F | Resp 16 | Ht 61.0 in | Wt 164.1 lb

## 2023-11-11 DIAGNOSIS — D696 Thrombocytopenia, unspecified: Secondary | ICD-10-CM

## 2023-11-11 DIAGNOSIS — R221 Localized swelling, mass and lump, neck: Secondary | ICD-10-CM | POA: Insufficient documentation

## 2023-11-11 DIAGNOSIS — E039 Hypothyroidism, unspecified: Secondary | ICD-10-CM | POA: Insufficient documentation

## 2023-11-11 DIAGNOSIS — D708 Other neutropenia: Secondary | ICD-10-CM

## 2023-11-11 DIAGNOSIS — C50411 Malignant neoplasm of upper-outer quadrant of right female breast: Secondary | ICD-10-CM

## 2023-11-11 DIAGNOSIS — Z95828 Presence of other vascular implants and grafts: Secondary | ICD-10-CM | POA: Diagnosis not present

## 2023-11-11 DIAGNOSIS — Z853 Personal history of malignant neoplasm of breast: Secondary | ICD-10-CM | POA: Insufficient documentation

## 2023-11-11 LAB — CMP (CANCER CENTER ONLY)
ALT: 11 U/L (ref 0–44)
AST: 17 U/L (ref 15–41)
Albumin: 4.3 g/dL (ref 3.5–5.0)
Alkaline Phosphatase: 79 U/L (ref 38–126)
Anion gap: 4 — ABNORMAL LOW (ref 5–15)
BUN: 16 mg/dL (ref 6–20)
CO2: 30 mmol/L (ref 22–32)
Calcium: 9.3 mg/dL (ref 8.9–10.3)
Chloride: 102 mmol/L (ref 98–111)
Creatinine: 0.85 mg/dL (ref 0.44–1.00)
GFR, Estimated: >60 mL/min (ref >60)
Glucose, Bld: 106 mg/dL — ABNORMAL HIGH (ref 70–99)
Potassium: 3.8 mmol/L (ref 3.5–5.1)
Sodium: 136 mmol/L (ref 135–145)
Total Bilirubin: 0.4 mg/dL (ref 0.0–1.2)
Total Protein: 7.4 g/dL (ref 6.5–8.1)

## 2023-11-11 LAB — CBC WITH DIFFERENTIAL (CANCER CENTER ONLY)
Abs Immature Granulocytes: 0 10*3/uL (ref 0.00–0.07)
Basophils Absolute: 0 10*3/uL (ref 0.0–0.1)
Basophils Relative: 1 %
Eosinophils Absolute: 0 10*3/uL (ref 0.0–0.5)
Eosinophils Relative: 2 %
HCT: 31.5 % — ABNORMAL LOW (ref 36.0–46.0)
Hemoglobin: 10.5 g/dL — ABNORMAL LOW (ref 12.0–15.0)
Immature Granulocytes: 0 %
Lymphocytes Relative: 20 %
Lymphs Abs: 0.4 10*3/uL — ABNORMAL LOW (ref 0.7–4.0)
MCH: 30.1 pg (ref 26.0–34.0)
MCHC: 33.3 g/dL (ref 30.0–36.0)
MCV: 90.3 fL (ref 80.0–100.0)
Monocytes Absolute: 0.2 10*3/uL (ref 0.1–1.0)
Monocytes Relative: 11 %
Neutro Abs: 1.5 10*3/uL — ABNORMAL LOW (ref 1.7–7.7)
Neutrophils Relative %: 66 %
Platelet Count: 132 10*3/uL — ABNORMAL LOW (ref 150–400)
RBC: 3.49 MIL/uL — ABNORMAL LOW (ref 3.87–5.11)
RDW: 12.5 % (ref 11.5–15.5)
WBC Count: 2.2 10*3/uL — ABNORMAL LOW (ref 4.0–10.5)
nRBC: 0 % (ref 0.0–0.2)

## 2023-11-11 LAB — TSH: TSH: 33.128 u[IU]/mL — ABNORMAL HIGH (ref 0.350–4.500)

## 2023-11-11 MED ORDER — SODIUM CHLORIDE 0.9% FLUSH
10.0000 mL | Freq: Once | INTRAVENOUS | Status: AC
Start: 2023-11-11 — End: 2023-11-11
  Administered 2023-11-11: 10 mL

## 2023-11-11 MED ORDER — HEPARIN SOD (PORK) LOCK FLUSH 100 UNIT/ML IV SOLN
500.0000 [IU] | Freq: Once | INTRAVENOUS | Status: AC
  Administered 2023-11-11: 500 [IU]

## 2023-11-11 NOTE — Assessment & Plan Note (Addendum)
 Breast cancer  Discontinued immunotherapy and Xeloda due to side effects. Uncertain impact of incomplete immunotherapy cycles. Xeloda not essential for all. - Discontinue remaining cycles of immunotherapy. - Provide information about Guardant Reveal for MRD testing - Perform blood test every six months to monitor for cancer recurrence.  Lymphadenopathy Lymph nodes palpable, not indicative of metastatic breast cancer. Likely reactive lymphadenopathy from past viral infection. - Monitor lymph nodes for two weeks. - If lymph nodes persist or increase, order a full body PET scan.  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 - Discuss menopausal symptoms with primary care physician for potential management options.  Anxiety and depression Worsening symptoms affecting daily activities and sleep. - Discuss anxiety and depression with primary care physician for potential management options.  Hypotension Episodes of lightheadedness and low blood pressure affecting daily activities. - Monitor blood pressure and discuss with primary care physician for further evaluation.  Vision changes Blurred vision, diagnosed with dry eyes. No concerning findings from ophthalmologist. - Use prescribed ointment for dry eyes. - Use reading glasses as needed.  Leukopenia continues, no significant decline.Will continue to monitor  Thrombocytopenia, mild, will continue to monitor.  Follow-up Scheduled for mammogram and primary care consultation for further evaluations. - Perform mammogram on March 25. - Consult with primary care physician regarding colonoscopy and other health concerns. - Schedule follow-up appointment in two weeks to reassess lymphadenopathy.

## 2023-11-12 ENCOUNTER — Encounter: Payer: Self-pay | Admitting: *Deleted

## 2023-11-12 ENCOUNTER — Telehealth: Payer: Self-pay | Admitting: Hematology and Oncology

## 2023-11-12 DIAGNOSIS — C50411 Malignant neoplasm of upper-outer quadrant of right female breast: Secondary | ICD-10-CM

## 2023-11-12 LAB — T4: T4, Total: 4.1 ug/dL — ABNORMAL LOW (ref 4.5–12.0)

## 2023-11-12 NOTE — Progress Notes (Signed)
 My response to the following coding query: Please clarify the medical condition(s) necessitating the order for SARS,RSV,Flu A&B   Evaluate for viral illness

## 2023-11-12 NOTE — Telephone Encounter (Signed)
 Left patient a vm regarding upcoming appointment

## 2023-11-13 ENCOUNTER — Other Ambulatory Visit: Payer: Self-pay | Admitting: Hematology and Oncology

## 2023-11-13 DIAGNOSIS — C50411 Malignant neoplasm of upper-outer quadrant of right female breast: Secondary | ICD-10-CM

## 2023-11-13 DIAGNOSIS — E039 Hypothyroidism, unspecified: Secondary | ICD-10-CM

## 2023-11-16 ENCOUNTER — Telehealth: Payer: Self-pay | Admitting: Hematology and Oncology

## 2023-11-16 NOTE — Telephone Encounter (Signed)
 Spoke with patient confirming upcoming appointment

## 2023-11-17 ENCOUNTER — Ambulatory Visit: Payer: Medicare HMO | Admitting: Hematology and Oncology

## 2023-11-17 ENCOUNTER — Other Ambulatory Visit: Payer: Medicare HMO

## 2023-11-17 ENCOUNTER — Ambulatory Visit: Payer: Medicare HMO

## 2023-11-19 DIAGNOSIS — F330 Major depressive disorder, recurrent, mild: Secondary | ICD-10-CM | POA: Diagnosis not present

## 2023-11-19 DIAGNOSIS — F4310 Post-traumatic stress disorder, unspecified: Secondary | ICD-10-CM | POA: Diagnosis not present

## 2023-11-19 DIAGNOSIS — F411 Generalized anxiety disorder: Secondary | ICD-10-CM | POA: Diagnosis not present

## 2023-11-24 ENCOUNTER — Ambulatory Visit
Admission: RE | Admit: 2023-11-24 | Discharge: 2023-11-24 | Disposition: A | Discharge status: Home / Self Care | Source: Ambulatory Visit | Attending: Hematology and Oncology

## 2023-11-24 ENCOUNTER — Other Ambulatory Visit (HOSPITAL_COMMUNITY): Payer: Self-pay

## 2023-11-24 DIAGNOSIS — Z08 Encounter for follow-up examination after completed treatment for malignant neoplasm: Secondary | ICD-10-CM | POA: Diagnosis not present

## 2023-11-24 DIAGNOSIS — Z853 Personal history of malignant neoplasm of breast: Secondary | ICD-10-CM

## 2023-11-26 ENCOUNTER — Other Ambulatory Visit (HOSPITAL_COMMUNITY): Payer: Self-pay

## 2023-11-26 ENCOUNTER — Inpatient Hospital Stay (HOSPITAL_BASED_OUTPATIENT_CLINIC_OR_DEPARTMENT_OTHER): Admitting: Hematology and Oncology

## 2023-11-26 ENCOUNTER — Other Ambulatory Visit: Payer: Self-pay

## 2023-11-26 ENCOUNTER — Inpatient Hospital Stay

## 2023-11-26 VITALS — BP 107/60 | HR 68 | Resp 16 | Wt 163.7 lb

## 2023-11-26 DIAGNOSIS — C50411 Malignant neoplasm of upper-outer quadrant of right female breast: Secondary | ICD-10-CM

## 2023-11-26 DIAGNOSIS — Z853 Personal history of malignant neoplasm of breast: Secondary | ICD-10-CM | POA: Diagnosis not present

## 2023-11-26 DIAGNOSIS — R221 Localized swelling, mass and lump, neck: Secondary | ICD-10-CM | POA: Diagnosis not present

## 2023-11-26 DIAGNOSIS — E039 Hypothyroidism, unspecified: Secondary | ICD-10-CM | POA: Diagnosis not present

## 2023-11-26 LAB — CMP (CANCER CENTER ONLY)
ALT: 12 U/L (ref 0–44)
AST: 19 U/L (ref 15–41)
Albumin: 4.5 g/dL (ref 3.5–5.0)
Alkaline Phosphatase: 85 U/L (ref 38–126)
Anion gap: 3 — ABNORMAL LOW (ref 5–15)
BUN: 15 mg/dL (ref 6–20)
CO2: 32 mmol/L (ref 22–32)
Calcium: 9.7 mg/dL (ref 8.9–10.3)
Chloride: 104 mmol/L (ref 98–111)
Creatinine: 0.87 mg/dL (ref 0.44–1.00)
GFR, Estimated: >60 mL/min (ref >60)
Glucose, Bld: 90 mg/dL (ref 70–99)
Potassium: 3.9 mmol/L (ref 3.5–5.1)
Sodium: 139 mmol/L (ref 135–145)
Total Bilirubin: 0.4 mg/dL (ref 0.0–1.2)
Total Protein: 7.9 g/dL (ref 6.5–8.1)

## 2023-11-26 LAB — TSH: TSH: 8.009 u[IU]/mL — ABNORMAL HIGH (ref 0.350–4.500)

## 2023-11-26 LAB — T4, FREE: Free T4: 0.75 ng/dL (ref 0.61–1.12)

## 2023-11-26 NOTE — Progress Notes (Signed)
 [facility] Cancer Follow up:    Maria Shackleton, NP-C [address] [place] [address]   DIAGNOSIS:  Cancer Staging  Malignant neoplasm of upper-outer quadrant of right female breast [facility]) Staging form: Breast, AJCC 8th Edition - Clinical: Stage IIB (cT2, cN0, cM0, G3, ER+, PR-, HER2-) - Signed by Maria Moulds, MD on 10/08/2022 Stage prefix: Initial diagnosis Histologic grading system: 3 grade system    SUMMARY OF ONCOLOGIC HISTORY: Oncology History  Malignant neoplasm of upper-outer quadrant of right female breast (HCC)  09/26/2022 Mammogram   Patient had a palpable right breast mass and hence underwent bilateral diagnostic mammogram.  This confirmed a suspicious 3 cm right upper outer quadrant mass and an abnormal right axillary lymph node with cortical thickening.  Tissue sampling of both the breast mass and axillary lymph node were recommended.  Left breast was normal.   09/26/2022 Breast US   Ultrasound breast confirmed the above-mentioned findings.   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  Prognostic showed ER 50% positive weak staining PR 0% negative HER2 1+ by IHC and Ki-67 of 70%   10/08/2022 Cancer Staging   Staging form: Breast, AJCC 8th Edition - Clinical: Stage IIB (cT2, cN0, cM0, G3, ER+, PR-, HER2-) - Signed by Maria Moulds, MD on 10/08/2022 Stage prefix: Initial diagnosis Histologic grading system: 3 grade system   10/24/2022 - 11/14/2022 Chemotherapy   Patient is on Treatment Plan : BREAST Pembrolizumab (200) D1 + Carboplatin (5) D1 + Paclitaxel (80) D1,8,15 q21d X 4 cycles / Pembrolizumab (200) D1 + AC D1 q21d x 4 cycles      Genetic Testing   [facility] Common Cancer Panel+RNA was Negative. Report date is 10/16/2022.  The Common Hereditary Cancers Panel offered by [facility] includes sequencing and/or deletion duplication testing of the following 48 genes: APC, ATM, AXIN2, BAP1,  BARD1, BMPR1A, BRCA1, BRCA2, BRIP1, CDH1, CDK4, CDKN2A (p14ARF and p16INK4a only), CHEK2, CTNNA1, DICER1, EPCAM (Deletion/duplication testing only), FH, GREM1 (promoter region duplication testing only), HOXB13, KIT, MBD4, MEN1, MLH1, MSH2, MSH3, MSH6, MUTYH, NF1, NHTL1, PALB2, PDGFRA, PMS2, POLD1, POLE, PTEN, RAD51C, RAD51D, SDHA (sequencing analysis only except exon 14), SDHB, SDHC, SDHD, SMAD4, SMARCA4. STK11, TP53, TSC1, TSC2, and VHL.   11/21/2022 - 04/08/2023 Chemotherapy   Patient is on Treatment Plan : BREAST Pembrolizumab (200) D1 + Carboplatin (1.5) D1,8,15 + Paclitaxel (80) D1,8,15 q21d X 4 cycles / Pembrolizumab (200) D1 + AC D1 q21d x 4 cycles     05/19/2023 - 09/28/2023 Chemotherapy   Patient is on Treatment Plan : BREAST Pembrolizumab (200) q21d x 27 weeks     05/21/2023 Surgery   Right lumpectomy: metaplastic IDC with chondroid differentiation, 1.8 cm, grade 3, margins negative, no LVI, 8 SLN negative, repeat prognostic panel: ER 40% positive, weak staining, PR 0% negative, Ki-67 80%, HER2 negative (0). RCB-II   07/06/2023 - 08/04/2023 Radiation Therapy   Adjuvant radiation therapy     CURRENT THERAPY:  INTERVAL HISTORY: Maria Carter 53 y.o. female returns for follow up while on immunotherapy and xeloda.  Discussed the use of AI scribe software for clinical note transcription with the patient, who gave verbal consent to proceed.  History of Present Illness    The patient is a 53 year old with breast cancer who presents with a palpable posterior cervical adenopathy.  She restarted her thyroid medication approximately two weeks ago after being contacted by a nurse. There is some  improvement in symptoms, including reduced hot flashes and emotional lability, though fatigue persists. She continues to experience a persistent 'knot' in her neck, which has not resolved despite medication.  No new lumps or swelling are noted, and there is no pain associated with the existing lump. Her  recent mammogram results were normal, with no abnormalities detected.  She experiences episodes of dizziness at home, which she attributes to low blood pressure readings, although her blood pressure was noted to be good during today's visit.  She mentions difficulty with blood draws, noting that it was challenging for the lab to obtain blood from her arm, and she was only able to collect one tube during a recent attempt. She has a port in place, which she questions the necessity of keeping if no further treatments are planned.  She has completed most of her cancer treatment, including immunotherapy and Xeloda, but skipped the last two treatments. She is considering port removal if no further treatment is needed.  She is scheduled for a survivorship visit to discuss long-term side effects and follow-up care post-cancer treatment.  Rest of the pertinent 10 point ROS reviewed and negative  Patient Active Problem List   Diagnosis Date Noted   Port-A-Cath in place 10/24/2022   Genetic testing 10/16/2022   Malignant neoplasm of upper-outer quadrant of right female breast (HCC) 10/06/2022   Thrombocytopenia (HCC) 03/05/2022   Leukopenia 03/05/2022   Hot flashes 02/28/2022   Hair thinning 02/28/2022   Amenorrhea 02/28/2022   Screening for colon cancer 02/28/2022   Multiple nevi 02/28/2022   PTSD (post-traumatic stress disorder) 02/08/2021   Mild episode of recurrent major depressive disorder (HCC) 09/12/2013   Attention deficit disorder 04/25/2013   Migraine 02/05/2013   KELOID SCAR 05/09/2010   URINALYSIS, ABNORMAL 05/09/2010   Anxiety state 03/01/2010   DEPRESSION 03/01/2010   Headache 03/01/2010   Intermittent chest pain 03/01/2010    has no known allergies.  MEDICAL HISTORY: Past Medical History:  Diagnosis Date   Anxiety    Breast cancer (HCC)    right breast IDC   Depression    Hx of febrile seizure    1980   Hypothyroidism     SURGICAL HISTORY: Past Surgical History:   Procedure Laterality Date   BREAST BIOPSY Right 09/30/2022   [place] RT BREAST BX W LOC DEV 1ST LESION IMG BX SPEC US GUIDE 09/30/2022 [facility] MAMMOGRAPHY   BREAST BIOPSY  05/20/2023   MM RT RADIOACTIVE SEED LOC MAMMO GUIDE 05/20/2023 [facility] MAMMOGRAPHY   BREAST LUMPECTOMY Right 05/21/2023   BREAST LUMPECTOMY WITH RADIOACTIVE SEED AND SENTINEL LYMPH NODE BIOPSY Right 05/21/2023   Procedure: RIGHT BREAST LUMPECTOMY WITH RADIOACTIVE SEED AND SENTINEL LYMPH NODE BIOPSY;  Surgeon: [name], MD;  Location: [facility];  Service: General;  Laterality: Right;  LMA PEC BLOCK   EXCISION OF KELOID N/A 05/21/2023   Procedure: EXCISION CHEST WALL SCAR;  Surgeon: [name], MD;  Location: [facility];  Service: General;  Laterality: N/A;   PORTACATH PLACEMENT N/A 10/23/2022   Procedure: INSERTION PORT-A-CATH;  Surgeon: [name], MD;  Location: [facility];  Service: General;  Laterality: N/A;    SOCIAL HISTORY: Social History   Socioeconomic History   Marital status: Single    Spouse name: Not on file   Number of children: Not on file   Years of education: Not on file   Highest education level: Not on file  Occupational History   Not on file  Tobacco Use   Smoking status: Never   Smokeless tobacco: Never  Vaping Use   Vaping status: Never Used  Substance and Sexual Activity   Alcohol use: Not Currently    Comment: social   Drug use: No   Sexual activity: Yes    Partners: Male    Birth control/protection: None  Other Topics Concern   Not on file  Social History Narrative   Not on file   Social Drivers of Health   Financial Resource Strain: High Risk (10/08/2022)   Overall Financial Resource Strain (CARDIA)    Difficulty of Paying Living Expenses: Hard  Food Insecurity: Food Insecurity Present (09/03/2023)   Hunger Vital Sign    Worried About Running Out of Food in the Last Year: Sometimes true    Ran Out of Food in the  Last Year: Sometimes true  Transportation Needs: No Transportation Needs (10/08/2022)   PRAPARE - Administrator, Civil Service (Medical): No    Lack of Transportation (Non-Medical): No  Physical Activity: Not on file  Stress: Not on file  Social Connections: Not on file  Intimate Partner Violence: Not on file    FAMILY HISTORY: Family History  Problem Relation Age of Onset   Breast cancer Sister 67 - 24   Cancer Paternal Grandmother        unknown type    Review of Systems  Constitutional:  Negative for appetite change, chills, fatigue, fever and unexpected weight change.  HENT:   Negative for hearing loss, lump/mass and trouble swallowing.   Eyes:  Negative for eye problems and icterus.  Respiratory:  Negative for chest tightness, cough and shortness of breath.   Cardiovascular:  Negative for chest pain, leg swelling and palpitations.  Gastrointestinal:  Negative for abdominal distention, abdominal pain, constipation, diarrhea, nausea and vomiting.  Endocrine: Negative for hot flashes.  Genitourinary:  Negative for difficulty urinating.   Musculoskeletal:  Negative for arthralgias.  Skin:  Negative for itching and rash.  Neurological:  Negative for dizziness, extremity weakness, headaches and numbness.  Hematological:  Negative for adenopathy. Does not bruise/bleed easily.  Psychiatric/Behavioral:  Negative for depression. The patient is not nervous/anxious.       PHYSICAL EXAMINATION    Vitals:   11/26/23 1342  BP: 107/60  Pulse: 68  Resp: 16  SpO2: 100%      Physical Exam Constitutional:      General: She is not in acute distress.    Appearance: Normal appearance. She is not toxic-appearing.  HENT:     Head: Normocephalic and atraumatic.     Mouth/Throat:     Mouth: Mucous membranes are moist.     Pharynx: Oropharynx is clear. No oropharyngeal exudate or posterior oropharyngeal erythema.  Eyes:     General: No scleral icterus. Cardiovascular:      Rate and Rhythm: Normal rate and regular rhythm.     Pulses: Normal pulses.     Heart sounds: Normal heart sounds.  Pulmonary:     Effort: Pulmonary effort is normal.     Breath sounds: Normal breath sounds.  Abdominal:     General: Abdomen is flat. Bowel sounds are normal. There is no distension.     Palpations: Abdomen is soft.     Tenderness: There is no abdominal tenderness.  Musculoskeletal:        General: No swelling.     Cervical back: Neck supple.  Lymphadenopathy:     Cervical: Cervical adenopathy (small mobile post cervical  LN and Submental LN noted. No other adenopathy) present.  Skin:    General: Skin is warm and dry.     Findings: No rash.  Neurological:     General: No focal deficit present.     Mental Status: She is alert.  Psychiatric:        Mood and Affect: Mood normal.        Behavior: Behavior normal.     LABORATORY DATA:  CBC    Component Value Date/Time   WBC 2.2 (L) 11/11/2023 1237   WBC 2.3 (L) 10/22/2023 2122   RBC 3.49 (L) 11/11/2023 1237   HGB 10.5 (L) 11/11/2023 1237   HCT 31.5 (L) 11/11/2023 1237   PLT 132 (L) 11/11/2023 1237   MCV 90.3 11/11/2023 1237   MCH 30.1 11/11/2023 1237   MCHC 33.3 11/11/2023 1237   RDW 12.5 11/11/2023 1237   LYMPHSABS 0.4 (L) 11/11/2023 1237   MONOABS 0.2 11/11/2023 1237   EOSABS 0.0 11/11/2023 1237   BASOSABS 0.0 11/11/2023 1237    CMP     Component Value Date/Time   NA 139 11/26/2023 1335   K 3.9 11/26/2023 1335   CL 104 11/26/2023 1335   CO2 32 11/26/2023 1335   GLUCOSE 90 11/26/2023 1335   BUN 15 11/26/2023 1335   CREATININE 0.87 11/26/2023 1335   CALCIUM 9.7 11/26/2023 1335   PROT 7.9 11/26/2023 1335   ALBUMIN 4.5 11/26/2023 1335   AST 19 11/26/2023 1335   ALT 12 11/26/2023 1335   ALKPHOS 85 11/26/2023 1335   BILITOT 0.4 11/26/2023 1335   GFRNONAA >60 11/26/2023 1335   GFRAA >60 12/23/2015 1151        ASSESSMENT and THERAPY PLAN:   Malignant neoplasm of upper-outer quadrant of  right female breast (HCC) Breast cancer - She received neoadj KEYNOTE 522 regimen, couldn't complete adj immunotherapy because of constellation of bothersome symptoms. She tried xeloda but couldn't tolerate it at all. In follow-up phase. Survivorship visit planned to summarize treatment and discuss long-term side effects. - Schedule survivorship visit with nurse practitioner.  Albertson's in place from previous treatments. Removal planned pending PET and GUARDANT results. - Order port removal approximately one month from now. Order placed. - Coordinate port removal with radiology after confirming PET and GUARDENT results.  Persistent neck mass Reports persistent post cervical LN. PET scan planned for further evaluation. - Order PET scan to evaluate the neck mass. - If PET scan is not approved, consider CT scan.  Thyroid disorder Restarted thyroid medication with some symptom improvement. Persistent fatigue and neck mass. Monitoring thyroid function; referral to endocrinologist planned. - Refer to endocrinologist for thyroid management. - Order labs in four weeks to monitor thyroid function.  Follow-up Follow-up planned in three to four weeks to discuss PET and GUARDANT results. Next follow-up in six months if results satisfactory.       All questions were answered. The patient knows to call the clinic with any problems, questions or concerns. We can certainly see the patient much sooner if necessary.

## 2023-11-26 NOTE — Progress Notes (Signed)
 Patient did not tolerate Xeloda, Dr. Al Pimple is aware.  Disenrolling from Specialty Pharmacy services.

## 2023-11-26 NOTE — Assessment & Plan Note (Addendum)
 Breast cancer - She received neoadj KEYNOTE 522 regimen, couldn't complete adj immunotherapy because of constellation of bothersome symptoms. She tried xeloda but couldn't tolerate it at all. In follow-up phase. Survivorship visit planned to summarize treatment and discuss long-term side effects. - Schedule survivorship visit with nurse practitioner.  Albertson's in place from previous treatments. Removal planned pending PET and GUARDANT results. - Order port removal approximately one month from now. Order placed. - Coordinate port removal with radiology after confirming PET and GUARDENT results.  Persistent neck mass Reports persistent post cervical LN. PET scan planned for further evaluation. - Order PET scan to evaluate the neck mass. - If PET scan is not approved, consider CT scan.  Thyroid disorder Restarted thyroid medication with some symptom improvement. Persistent fatigue and neck mass. Monitoring thyroid function; referral to endocrinologist planned. - Refer to endocrinologist for thyroid management. - Order labs in four weeks to monitor thyroid function.  Follow-up Follow-up planned in three to four weeks to discuss PET and GUARDANT results. Next follow-up in six months if results satisfactory.

## 2023-11-27 ENCOUNTER — Telehealth: Payer: Self-pay | Admitting: Hematology and Oncology

## 2023-11-27 ENCOUNTER — Other Ambulatory Visit: Payer: Self-pay | Admitting: Surgery

## 2023-11-27 NOTE — Telephone Encounter (Signed)
 Spoke with patient confirming upcoming appointment

## 2023-11-29 DIAGNOSIS — C50411 Malignant neoplasm of upper-outer quadrant of right female breast: Secondary | ICD-10-CM | POA: Diagnosis not present

## 2023-12-01 ENCOUNTER — Ambulatory Visit (INDEPENDENT_AMBULATORY_CARE_PROVIDER_SITE_OTHER): Admitting: Family Medicine

## 2023-12-01 ENCOUNTER — Other Ambulatory Visit: Payer: Self-pay | Admitting: Surgery

## 2023-12-01 ENCOUNTER — Encounter: Payer: Self-pay | Admitting: Family Medicine

## 2023-12-01 ENCOUNTER — Encounter: Payer: Self-pay | Admitting: *Deleted

## 2023-12-01 VITALS — BP 116/74 | HR 70 | Temp 97.6°F | Ht 61.0 in | Wt 163.0 lb

## 2023-12-01 DIAGNOSIS — Z6830 Body mass index (BMI) 30.0-30.9, adult: Secondary | ICD-10-CM | POA: Diagnosis not present

## 2023-12-01 DIAGNOSIS — Z0000 Encounter for general adult medical examination without abnormal findings: Secondary | ICD-10-CM | POA: Diagnosis not present

## 2023-12-01 DIAGNOSIS — F32A Depression, unspecified: Secondary | ICD-10-CM | POA: Diagnosis not present

## 2023-12-01 DIAGNOSIS — E66811 Obesity, class 1: Secondary | ICD-10-CM | POA: Diagnosis not present

## 2023-12-01 DIAGNOSIS — Z124 Encounter for screening for malignant neoplasm of cervix: Secondary | ICD-10-CM

## 2023-12-01 DIAGNOSIS — F419 Anxiety disorder, unspecified: Secondary | ICD-10-CM

## 2023-12-01 DIAGNOSIS — Z1211 Encounter for screening for malignant neoplasm of colon: Secondary | ICD-10-CM

## 2023-12-01 DIAGNOSIS — Z0001 Encounter for general adult medical examination with abnormal findings: Secondary | ICD-10-CM

## 2023-12-01 NOTE — Progress Notes (Signed)
 Complete physical exam  Patient: Maria Carter   DOB: 1971/05/26   53 y.o. Female  MRN: [id number]  Subjective:    Chief Complaint  Patient presents with   Annual Exam    On the neck has knot since for a while Not fasting    She is here for a complete physical exam.  Under the care of Dr. Al Pimple   Referred to endocrinologist for thyroid. Started levothyroxine for the past month.   She has dogs.  She has 4 children, 8 grandchildren.  Lives her 2 youngest children.   Is not working. She used to be a home health aide.   Father has multiple myeloma and was diagnosed the same day she was diagnosed with her breast cancer.   Depressed - on medication, Pristiq, for the past 2 weeks and does not have a therapist.       Health Maintenance  Topic Date Due   Medicare Annual Wellness Visit  Never done   COVID-19 Vaccine (1) Never done   Pneumococcal Vaccination (1 of 2 - PCV) Never done   DTaP/Tdap/Td vaccine (1 - Tdap) Never done   Zoster (Shingles) Vaccine (1 of 2) Never done   Pap with HPV screening  05/09/2013   Colon Cancer Screening  Never done   Flu Shot  04/01/2024   Mammogram  11/23/2025   Hepatitis C Screening  Completed   HIV Screening  Completed   HPV Vaccine  Aged Out    Wears seatbelt always, uses sunscreen, smoke detectors in home and functioning, does not text while driving, feels safe in home environment.  Depression screening:    12/01/2023   11:08 AM 02/28/2022   10:14 AM  Depression screen PHQ 2/9  Decreased Interest 3 3  Down, Depressed, Hopeless 3 3  PHQ - 2 Score 6 6  Altered sleeping 2 2  Tired, decreased energy 3 3  Change in appetite 0 1  Feeling bad or failure about yourself  3 3  Trouble concentrating 3 3  Moving slowly or fidgety/restless 3 2  Suicidal thoughts 0 0  PHQ-9 Score 20 20   Anxiety Screening:    12/01/2023   11:09 AM  GAD 7 : Generalized Anxiety Score  Nervous, Anxious, on Edge 2  Control/stop worrying 3  Worry too  much - different things 3  Trouble relaxing 3  Restless 2  Easily annoyed or irritable 3  Afraid - awful might happen 3  Total GAD 7 Score 19    Vision:Within last year and Dental: No current dental problems and No regular dental care   Patient Active Problem List   Diagnosis Date Noted   Port-A-Cath in place 10/24/2022   Genetic testing 10/16/2022   Malignant neoplasm of upper-outer quadrant of right female breast (HCC) 10/06/2022   Thrombocytopenia (HCC) 03/05/2022   Leukopenia 03/05/2022   Hot flashes 02/28/2022   Hair thinning 02/28/2022   Amenorrhea 02/28/2022   Screening for colon cancer 02/28/2022   Multiple nevi 02/28/2022   PTSD (post-traumatic stress disorder) 02/08/2021   Mild episode of recurrent major depressive disorder (HCC) 09/12/2013   Attention deficit disorder 04/25/2013   Migraine 02/05/2013   KELOID SCAR 05/09/2010   URINALYSIS, ABNORMAL 05/09/2010   Anxiety state 03/01/2010   DEPRESSION 03/01/2010   Headache 03/01/2010   Intermittent chest pain 03/01/2010   Past Medical History:  Diagnosis Date   Anxiety    Breast cancer (HCC)    right breast IDC   Depression  Hx of febrile seizure    1980   Hypothyroidism    Past Surgical History:  Procedure Laterality Date   BREAST BIOPSY Right 09/30/2022   [place] RT BREAST BX W LOC DEV 1ST LESION IMG BX SPEC US GUIDE 09/30/2022 [facility]   BREAST BIOPSY  05/20/2023   MM RT RADIOACTIVE SEED LOC MAMMO GUIDE 05/20/2023 [facility]   BREAST LUMPECTOMY Right 05/21/2023   BREAST LUMPECTOMY WITH RADIOACTIVE SEED AND SENTINEL LYMPH NODE BIOPSY Right 05/21/2023   Procedure: RIGHT BREAST LUMPECTOMY WITH RADIOACTIVE SEED AND SENTINEL LYMPH NODE BIOPSY;  Surgeon: [name], MD;  Location: [facility];  Service: General;  Laterality: Right;  LMA PEC BLOCK   EXCISION OF KELOID N/A 05/21/2023   Procedure: EXCISION CHEST WALL SCAR;  Surgeon: [name], MD;  Location: [facility];  Service: General;  Laterality: N/A;   PORTACATH PLACEMENT N/A 10/23/2022   Procedure: INSERTION PORT-A-CATH;  Surgeon: [name], MD;  Location: [facility];  Service: General;  Laterality: N/A;   Social History   Tobacco Use   Smoking status: Never   Smokeless tobacco: Never  Vaping Use   Vaping status: Never Used  Substance Use Topics   Alcohol use: Not Currently    Comment: social   Drug use: No      Patient Care Team: [name], NP-C as PCP - General (Family Medicine) [name], MD as Consulting Physician (General Surgery) [name], MD as Consulting Physician (Hematology and Oncology) Dorothy Puffer, MD as Consulting Physician (Radiation Oncology) Pershing Proud, RN as Oncology Nurse Navigator Donnelly Angelica, RN as Oncology Nurse Navigator   Outpatient Medications Prior to Visit  Medication Sig   desvenlafaxine (PRISTIQ) 100 MG 24 hr tablet Take 100 mg by mouth daily.   levothyroxine (SYNTHROID) 25 MCG tablet Take 25 mcg by mouth daily before breakfast. Take 2 tablets once daily   busPIRone (BUSPAR) 15 MG tablet Take by mouth. (Patient not taking: Reported on 12/01/2023)   diclofenac Sodium (VOLTAREN) 1 % GEL Apply 0.5 grams (1 fingertip) to each hand and each foot twice daily for up to 12 weeks (Patient not taking: Reported on 12/01/2023)   Multiple Vitamins-Minerals (MULTIVITAMIN ADULTS PO) Take 1 tablet by mouth daily. (Patient not taking: Reported on 12/01/2023)   ondansetron (ZOFRAN) 8 MG tablet Take 1 tablet (8 mg total) by mouth every 8 (eight) hours as needed for nausea or vomiting. (Patient not taking: Reported on 12/01/2023)   prochlorperazine (COMPAZINE) 10 MG tablet Take 1 tablet (10 mg total) by mouth every 6 (six) hours as needed for nausea or vomiting. (Patient not taking: Reported on 12/01/2023)   [DISCONTINUED] ciprofloxacin (CIPRO) 500 MG tablet Take 1 tablet (500 mg total) by mouth 2 (two) times daily.    [DISCONTINUED] methylPREDNISolone (MEDROL DOSEPAK) 4 MG TBPK tablet Take as directed   No facility-administered medications prior to visit.    Review of Systems  Constitutional:  Positive for malaise/fatigue. Negative for chills and fever.  HENT:  Negative for congestion, ear pain, sinus pain and sore throat.   Eyes:  Negative for blurred vision, double vision and pain.  Respiratory:  Negative for cough, shortness of breath and wheezing.   Cardiovascular:  Negative for chest pain, palpitations and leg swelling.  Gastrointestinal:  Negative for abdominal pain, constipation, diarrhea, nausea and vomiting.  Genitourinary:  Negative for dysuria, frequency and urgency.  Musculoskeletal:  Negative for back pain, joint pain and myalgias.  Skin:  Negative for rash.  Neurological:  Negative for dizziness, tingling, focal weakness and headaches.  Psychiatric/Behavioral:  Positive for depression. Negative for suicidal ideas. The patient is nervous/anxious.        Objective:    BP 116/74 (BP Location: Left Arm, Patient Position: Sitting)   Pulse 70   Temp 97.6 F (36.4 C) (Temporal)   Ht 5\' 1"  (1.549 m)   Wt 163 lb (73.9 kg)   SpO2 98%   BMI 30.80 kg/m  BP Readings from Last 3 Encounters:  12/01/23 116/74  11/26/23 107/60  11/11/23 (!) 115/50   Wt Readings from Last 3 Encounters:  12/01/23 163 lb (73.9 kg)  11/26/23 163 lb 11.2 oz (74.3 kg)  11/11/23 164 lb 1.6 oz (74.4 kg)    Physical Exam Constitutional:      General: She is not in acute distress.    Appearance: She is not ill-appearing.  HENT:     Right Ear: Tympanic membrane, ear canal and external ear normal.     Left Ear: Tympanic membrane, ear canal and external ear normal.     Nose: Nose normal.     Mouth/Throat:     Mouth: Mucous membranes are moist.     Pharynx: Oropharynx is clear.  Eyes:     Extraocular Movements: Extraocular movements intact.     Conjunctiva/sclera: Conjunctivae normal.     Pupils: Pupils are  equal, round, and reactive to light.  Neck:     Thyroid: No thyroid mass, thyromegaly or thyroid tenderness.  Cardiovascular:     Rate and Rhythm: Normal rate and regular rhythm.     Pulses: Normal pulses.     Heart sounds: Normal heart sounds.  Pulmonary:     Effort: Pulmonary effort is normal.     Breath sounds: Normal breath sounds.  Abdominal:     General: Bowel sounds are normal.     Palpations: Abdomen is soft.     Tenderness: There is no abdominal tenderness. There is no right CVA tenderness, left CVA tenderness, guarding or rebound.  Musculoskeletal:        General: Normal range of motion.     Cervical back: Normal range of motion and neck supple. No tenderness.     Right lower leg: No edema.     Left lower leg: No edema.  Lymphadenopathy:     Cervical: No cervical adenopathy.  Skin:    General: Skin is warm and dry.     Findings: No lesion or rash.     Comments: Posterior right side of neck with a small, non tender, mass   Neurological:     General: No focal deficit present.     Mental Status: She is alert and oriented to person, place, and time.     Cranial Nerves: No cranial nerve deficit.     Sensory: No sensory deficit.     Motor: No weakness.     Gait: Gait normal.  Psychiatric:        Mood and Affect: Mood normal.        Behavior: Behavior normal.        Thought Content: Thought content normal.      No results found for any visits on 12/01/23.    Assessment & Plan:    Routine Health Maintenance and Physical Exam  Problem List Items Addressed This Visit   None Visit Diagnoses       Encounter for general adult medical examination with abnormal findings    -  Primary     Anxiety and depression       Relevant Orders   Ambulatory referral to Psychology     Obesity (BMI 30.0-34.9)       Relevant Orders   Hemoglobin A1c   Lipid panel     Screen for colon cancer       Relevant Orders   Ambulatory referral to Gastroenterology     Screening for  cervical cancer       Relevant Orders   Ambulatory referral to Gynecology      Preventive health care reviewed.  Counseling on healthy lifestyle including diet and exercise.  Recommend regular dental and eye exams.  Immunizations reviewed.  Discussed safety. Overdue for cervical cancer screening. Referral to gynecologist.  Referral for counseling for anxiety and depression.  Referral for her first screening colonoscopy.  Under the care of oncology for breast cancer.  Upcoming PET scan.  Referred to endocrinology by Dr. Al Pimple  Follow up here for A1c due to elevated BS and fasting lipids.  She will check with oncologist and follow up here for pneumonia vaccine.  May get Tdap and Shingrix at pharmacy due to Medicare.  Follow up pending lab results.    No follow-ups on file.     Hetty Blend, NP-C

## 2023-12-01 NOTE — Patient Instructions (Signed)
 Please return for fasting labs next week. Come in hydrated.   Find out about getting your Tdap, pneumonia and Shingrix vaccines.

## 2023-12-03 ENCOUNTER — Ambulatory Visit (HOSPITAL_COMMUNITY)
Admission: RE | Admit: 2023-12-03 | Discharge: 2023-12-03 | Disposition: A | Discharge status: Home / Self Care | Source: Ambulatory Visit | Attending: Hematology and Oncology | Admitting: Hematology and Oncology

## 2023-12-03 ENCOUNTER — Encounter (HOSPITAL_COMMUNITY): Payer: Self-pay

## 2023-12-03 DIAGNOSIS — R918 Other nonspecific abnormal finding of lung field: Secondary | ICD-10-CM | POA: Diagnosis not present

## 2023-12-03 DIAGNOSIS — C50411 Malignant neoplasm of upper-outer quadrant of right female breast: Secondary | ICD-10-CM | POA: Insufficient documentation

## 2023-12-03 LAB — GLUCOSE, CAPILLARY: Glucose-Capillary: 95 mg/dL (ref 70–99)

## 2023-12-03 MED ORDER — FLUDEOXYGLUCOSE F - 18 (FDG) INJECTION
8.1400 | Freq: Once | INTRAVENOUS | Status: AC
  Administered 2023-12-03: 8.14 via INTRAVENOUS

## 2023-12-10 ENCOUNTER — Encounter: Payer: Self-pay | Admitting: Hematology and Oncology

## 2023-12-10 ENCOUNTER — Telehealth: Payer: Self-pay | Admitting: *Deleted

## 2023-12-10 NOTE — Telephone Encounter (Addendum)
 Called pt and reviewed per her reading on My Chart as well Informed her of MD review - as well as goal to monitor palable LN as well as will discuss appropriate follow up per areas in lung at her appt on 4/25. Pt appreciated call and explanation of scan results.  Val RN  ----- Message from [place] sent at 12/09/2023  6:56 PM EDT ----- No abn uptake on PET scan. Please ask the pt to monitor the LN and keep [place] posted if it get worse.

## 2023-12-18 ENCOUNTER — Inpatient Hospital Stay: Attending: Physician Assistant | Admitting: Licensed Clinical Social Worker

## 2023-12-18 DIAGNOSIS — C50411 Malignant neoplasm of upper-outer quadrant of right female breast: Secondary | ICD-10-CM | POA: Insufficient documentation

## 2023-12-18 DIAGNOSIS — F419 Anxiety disorder, unspecified: Secondary | ICD-10-CM | POA: Insufficient documentation

## 2023-12-18 DIAGNOSIS — D72819 Decreased white blood cell count, unspecified: Secondary | ICD-10-CM | POA: Insufficient documentation

## 2023-12-18 DIAGNOSIS — F32A Depression, unspecified: Secondary | ICD-10-CM | POA: Insufficient documentation

## 2023-12-18 DIAGNOSIS — Z79899 Other long term (current) drug therapy: Secondary | ICD-10-CM | POA: Insufficient documentation

## 2023-12-18 DIAGNOSIS — Z170 Estrogen receptor positive status [ER+]: Secondary | ICD-10-CM | POA: Insufficient documentation

## 2023-12-18 DIAGNOSIS — D696 Thrombocytopenia, unspecified: Secondary | ICD-10-CM | POA: Insufficient documentation

## 2023-12-18 NOTE — Progress Notes (Signed)
 [facility] CSW Progress Note  Clinical Child psychotherapist received TC from pt to inquire about transportation help for upcoming port removal as well as any other financial assistance.  Pt is no longer in active treatment for cancer, so does not qualify for additional grants from cancer foundations.  CSW provided information on local resources including [employer] and [employer].  Provided food pantry list.  Discussed potential transportation options including checking with insurance, [facility], and [facility]  Information sent to pt via e-mail per her request.    Delan Ksiazek E Jewell Haught, LCSW Clinical Social Worker [facility]

## 2023-12-24 ENCOUNTER — Other Ambulatory Visit: Payer: Self-pay

## 2023-12-24 DIAGNOSIS — C50411 Malignant neoplasm of upper-outer quadrant of right female breast: Secondary | ICD-10-CM

## 2023-12-25 ENCOUNTER — Inpatient Hospital Stay

## 2023-12-25 ENCOUNTER — Inpatient Hospital Stay (HOSPITAL_BASED_OUTPATIENT_CLINIC_OR_DEPARTMENT_OTHER): Admitting: Hematology and Oncology

## 2023-12-25 VITALS — BP 106/63 | HR 55 | Temp 97.7°F | Resp 16 | Wt 162.1 lb

## 2023-12-25 DIAGNOSIS — F419 Anxiety disorder, unspecified: Secondary | ICD-10-CM | POA: Diagnosis not present

## 2023-12-25 DIAGNOSIS — C50411 Malignant neoplasm of upper-outer quadrant of right female breast: Secondary | ICD-10-CM | POA: Diagnosis not present

## 2023-12-25 DIAGNOSIS — Z79899 Other long term (current) drug therapy: Secondary | ICD-10-CM | POA: Diagnosis not present

## 2023-12-25 DIAGNOSIS — D696 Thrombocytopenia, unspecified: Secondary | ICD-10-CM | POA: Diagnosis not present

## 2023-12-25 DIAGNOSIS — R221 Localized swelling, mass and lump, neck: Secondary | ICD-10-CM | POA: Diagnosis present

## 2023-12-25 DIAGNOSIS — F32A Depression, unspecified: Secondary | ICD-10-CM | POA: Diagnosis not present

## 2023-12-25 DIAGNOSIS — Z170 Estrogen receptor positive status [ER+]: Secondary | ICD-10-CM | POA: Diagnosis not present

## 2023-12-25 DIAGNOSIS — D72819 Decreased white blood cell count, unspecified: Secondary | ICD-10-CM | POA: Diagnosis not present

## 2023-12-25 LAB — CBC WITH DIFFERENTIAL (CANCER CENTER ONLY)
Abs Immature Granulocytes: 0 10*3/uL (ref 0.00–0.07)
Basophils Absolute: 0 10*3/uL (ref 0.0–0.1)
Basophils Relative: 1 %
Eosinophils Absolute: 0 10*3/uL (ref 0.0–0.5)
Eosinophils Relative: 2 %
HCT: 34.5 % — ABNORMAL LOW (ref 36.0–46.0)
Hemoglobin: 11.2 g/dL — ABNORMAL LOW (ref 12.0–15.0)
Immature Granulocytes: 0 %
Lymphocytes Relative: 24 %
Lymphs Abs: 0.5 10*3/uL — ABNORMAL LOW (ref 0.7–4.0)
MCH: 28.9 pg (ref 26.0–34.0)
MCHC: 32.5 g/dL (ref 30.0–36.0)
MCV: 88.9 fL (ref 80.0–100.0)
Monocytes Absolute: 0.2 10*3/uL (ref 0.1–1.0)
Monocytes Relative: 10 %
Neutro Abs: 1.3 10*3/uL — ABNORMAL LOW (ref 1.7–7.7)
Neutrophils Relative %: 63 %
Platelet Count: 143 10*3/uL — ABNORMAL LOW (ref 150–400)
RBC: 3.88 MIL/uL (ref 3.87–5.11)
RDW: 12.3 % (ref 11.5–15.5)
WBC Count: 2.1 10*3/uL — ABNORMAL LOW (ref 4.0–10.5)
nRBC: 0 % (ref 0.0–0.2)

## 2023-12-25 LAB — CMP (CANCER CENTER ONLY)
ALT: 10 U/L (ref 0–44)
AST: 15 U/L (ref 15–41)
Albumin: 4.3 g/dL (ref 3.5–5.0)
Alkaline Phosphatase: 76 U/L (ref 38–126)
Anion gap: 4 — ABNORMAL LOW (ref 5–15)
BUN: 12 mg/dL (ref 6–20)
CO2: 30 mmol/L (ref 22–32)
Calcium: 9.5 mg/dL (ref 8.9–10.3)
Chloride: 104 mmol/L (ref 98–111)
Creatinine: 0.74 mg/dL (ref 0.44–1.00)
GFR, Estimated: >60 mL/min (ref >60)
Glucose, Bld: 89 mg/dL (ref 70–99)
Potassium: 4.3 mmol/L (ref 3.5–5.1)
Sodium: 138 mmol/L (ref 135–145)
Total Bilirubin: 0.4 mg/dL (ref 0.0–1.2)
Total Protein: 7.6 g/dL (ref 6.5–8.1)

## 2023-12-25 LAB — TSH: TSH: 3.01 u[IU]/mL (ref 0.350–4.500)

## 2023-12-25 MED ORDER — TAMOXIFEN CITRATE 20 MG PO TABS: 20.0000 mg | ORAL_TABLET | Freq: Every day | ORAL | 0 refills | Status: DC

## 2023-12-25 NOTE — Assessment & Plan Note (Signed)
 Breast cancer - She received neoadj KEYNOTE 522 regimen, RCB II, couldn't complete adj immunotherapy because of constellation of bothersome symptoms. She tried xeloda  but couldn't tolerate it at all.  Assessment and Plan Assessment & Plan Breast cancer Weak estrogen receptor staining. PET scan shows no active cancer. Right breast skin thickening and right hemithorax opacity likely due to radiation. Lymph nodes show no cancerous activity. Open to anti-estrogen therapy with low threshold for discontinuation if side effects occur. - Initiate anti-estrogen therapy with close monitoring for side effects. - Guardant reveal neg in March 2025 - Schedule port flush every 6-8 weeks. - She is reluctant to have her port removed at this time.  Fatigue Persistent fatigue likely related to recent cancer treatment and recovery.  Dizziness Intermittent dizziness with movement. Normal brain MRI. Advised hydration and slow positional changes.  Depression and anxiety Ongoing depression and anxiety exacerbated by personal stressors. Therapist appointment scheduled.  Leukopenia and thrombocytopenia, intermittent at baseline Clinically asymptomatic, ok to monitor since it is not getting worse.  Goals of Care Desires to prevent cancer recurrence. Willing to try anti-estrogen therapy despite personal challenges. Concerned about managing health independently amidst stressors. FU in 6 weeks.

## 2023-12-25 NOTE — Progress Notes (Signed)
 [facility] Cancer Follow up:    Maria Abraham, NP-C [address] [place] [address]   DIAGNOSIS:  Cancer Staging  Malignant neoplasm of upper-outer quadrant of right female breast [facility]) Staging form: Breast, AJCC 8th Edition - Clinical: Stage IIB (cT2, cN0, cM0, G3, ER+, PR-, HER2-) - Signed by Murleen Arms, MD on 10/08/2022 Stage prefix: Initial diagnosis Histologic grading system: 3 grade system    SUMMARY OF ONCOLOGIC HISTORY: Oncology History  Malignant neoplasm of upper-outer quadrant of right female breast (HCC)  09/26/2022 Mammogram   Patient had a palpable right breast mass and hence underwent bilateral diagnostic mammogram.  This confirmed a suspicious 3 cm right upper outer quadrant mass and an abnormal right axillary lymph node with cortical thickening.  Tissue sampling of both the breast mass and axillary lymph node were recommended.  Left breast was normal.   09/26/2022 Breast US    Ultrasound breast confirmed the above-mentioned findings.   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  Prognostic showed ER 50% positive weak staining PR 0% negative HER2 1+ by IHC and Ki-67 of 70%   10/08/2022 Cancer Staging   Staging form: Breast, AJCC 8th Edition - Clinical: Stage IIB (cT2, cN0, cM0, G3, ER+, PR-, HER2-) - Signed by Murleen Arms, MD on 10/08/2022 Stage prefix: Initial diagnosis Histologic grading system: 3 grade system   10/24/2022 - 11/14/2022 Chemotherapy   Patient is on Treatment Plan : BREAST Pembrolizumab  (200) D1 + Carboplatin  (5) D1 + Paclitaxel  (80) D1,8,15 q21d X 4 cycles / Pembrolizumab  (200) D1 + AC D1 q21d x 4 cycles      Genetic Testing   [facility] Common Cancer Panel+RNA was Negative. Report date is 10/16/2022.  The Common Hereditary Cancers Panel offered by [facility] includes sequencing and/or deletion duplication testing of the following 48 genes: APC, ATM, AXIN2, BAP1,  BARD1, BMPR1A, BRCA1, BRCA2, BRIP1, CDH1, CDK4, CDKN2A (p14ARF and p16INK4a only), CHEK2, CTNNA1, DICER1, EPCAM (Deletion/duplication testing only), FH, GREM1 (promoter region duplication testing only), HOXB13, KIT, MBD4, MEN1, MLH1, MSH2, MSH3, MSH6, MUTYH, NF1, NHTL1, PALB2, PDGFRA, PMS2, POLD1, POLE, PTEN, RAD51C, RAD51D, SDHA (sequencing analysis only except exon 14), SDHB, SDHC, SDHD, SMAD4, SMARCA4. STK11, TP53, TSC1, TSC2, and VHL.   11/21/2022 - 04/08/2023 Chemotherapy   Patient is on Treatment Plan : BREAST Pembrolizumab  (200) D1 + Carboplatin  (1.5) D1,8,15 + Paclitaxel  (80) D1,8,15 q21d X 4 cycles / Pembrolizumab  (200) D1 + AC D1 q21d x 4 cycles     05/19/2023 - 09/28/2023 Chemotherapy   Patient is on Treatment Plan : BREAST Pembrolizumab  (200) q21d x 27 weeks     05/21/2023 Surgery   Right lumpectomy: metaplastic IDC with chondroid differentiation, 1.8 cm, grade 3, margins negative, no LVI, 8 SLN negative, repeat prognostic panel: ER 40% positive, weak staining, PR 0% negative, Ki-67 80%, HER2 negative (0). RCB-II   07/06/2023 - 08/04/2023 Radiation Therapy   Adjuvant radiation therapy     CURRENT THERAPY:  INTERVAL HISTORY: Maria Carter 53 y.o. female returns for follow up while on immunotherapy and xeloda .  Discussed the use of AI scribe software for clinical note transcription with the patient, who gave verbal consent to proceed.  History of Present Illness   History of Present Illness Maria Carter is a 53 year old female with recent cancer treatment who presents with ongoing fatigue and dizziness.  She experiences ongoing fatigue and dizziness, particularly when moving around, which necessitates sitting  down for relief. The dizziness is described as 'off and on,' and she also reports balance issues, such as 'missing my steps.' Despite adherence to her thyroid  medication, taking two pills daily, these symptoms persist.  A recent brain MRI was normal. She has completed her  immunotherapy and Xeloda  treatments. Previously, she deferred anti-estrogen therapy due to concurrent thyroid  issues and an infection. Her PET scan showed thickening of the right breast skin due to prior radiation, with no significant uptake in the lymph nodes.  She underwent a GUARDENT blood test about a month and a half ago, negative.  She expresses concern about her current social support, noting that her boyfriend, who supported her through treatment, has recently left. She feels overwhelmed and is managing her depression and anxiety with therapy appointments.   Rest of the pertinent 10 point ROS reviewed and negative  Patient Active Problem List   Diagnosis Date Noted   Port-A-Cath in place 10/24/2022   Genetic testing 10/16/2022   Malignant neoplasm of upper-outer quadrant of right female breast (HCC) 10/06/2022   Thrombocytopenia (HCC) 03/05/2022   Leukopenia 03/05/2022   Hot flashes 02/28/2022   Hair thinning 02/28/2022   Amenorrhea 02/28/2022   Screening for colon cancer 02/28/2022   Multiple nevi 02/28/2022   PTSD (post-traumatic stress disorder) 02/08/2021   Mild episode of recurrent major depressive disorder (HCC) 09/12/2013   Attention deficit disorder 04/25/2013   Migraine 02/05/2013   KELOID SCAR 05/09/2010   URINALYSIS, ABNORMAL 05/09/2010   Anxiety state 03/01/2010   DEPRESSION 03/01/2010   Headache 03/01/2010   Intermittent chest pain 03/01/2010    has no known allergies.  MEDICAL HISTORY: Past Medical History:  Diagnosis Date   Anxiety    Breast cancer (HCC)    right breast IDC   Depression    Hx of febrile seizure    1980   Hypothyroidism     SURGICAL HISTORY: Past Surgical History:  Procedure Laterality Date   BREAST BIOPSY Right 09/30/2022   US  RT BREAST BX W LOC DEV 1ST LESION IMG BX SPEC US  GUIDE 09/30/2022 [facility]   BREAST BIOPSY  05/20/2023   MM RT RADIOACTIVE SEED LOC MAMMO GUIDE 05/20/2023 [facility]   BREAST  LUMPECTOMY Right 05/21/2023   BREAST LUMPECTOMY WITH RADIOACTIVE SEED AND SENTINEL LYMPH NODE BIOPSY Right 05/21/2023   Procedure: RIGHT BREAST LUMPECTOMY WITH RADIOACTIVE SEED AND SENTINEL LYMPH NODE BIOPSY;  Surgeon: Maria Blumenthal, MD;  Location: [facility];  Service: General;  Laterality: Right;  LMA PEC BLOCK   EXCISION OF KELOID N/A 05/21/2023   Procedure: EXCISION CHEST WALL SCAR;  Surgeon: Maria Blumenthal, MD;  Location: [facility];  Service: General;  Laterality: N/A;   PORTACATH PLACEMENT N/A 10/23/2022   Procedure: INSERTION PORT-A-CATH;  Surgeon: Maria Blumenthal, MD;  Location: [facility];  Service: General;  Laterality: N/A;    SOCIAL HISTORY: Social History   Socioeconomic History   Marital status: Single    Spouse name: Not on file   Number of children: Not on file   Years of education: Not on file   Highest education level: 12th grade  Occupational History   Not on file  Tobacco Use   Smoking status: Never   Smokeless tobacco: Never  Vaping Use   Vaping status: Never Used  Substance and Sexual Activity   Alcohol use: Not Currently    Comment: social   Drug use: No   Sexual activity: Not Currently    Partners:  Male    Birth control/protection: None  Other Topics Concern   Not on file  Social History Narrative   Not on file   Social Drivers of Health   Financial Resource Strain: High Risk (12/18/2023)   Overall Financial Resource Strain (CARDIA)    Difficulty of Paying Living Expenses: Hard  Food Insecurity: Food Insecurity Present (12/18/2023)   Hunger Vital Sign    Worried About Running Out of Food in the Last Year: Sometimes true    Ran Out of Food in the Last Year: Sometimes true  Transportation Needs: Unmet Transportation Needs (12/18/2023)   PRAPARE - Administrator, Civil Service (Medical): Yes    Lack of Transportation (Non-Medical): No  Physical Activity: Unknown (11/27/2023)    Exercise Vital Sign    Days of Exercise per Week: 0 days    Minutes of Exercise per Session: Not on file  Stress: Stress Concern Present (11/27/2023)   [employer] of Occupational Health - Occupational Stress Questionnaire    Feeling of Stress : Rather much  Social Connections: Socially Isolated (11/27/2023)   Social Connection and Isolation Panel [NHANES]    Frequency of Communication with Friends and Family: Once a week    Frequency of Social Gatherings with Friends and Family: Never    Attends Religious Services: Never    Database administrator or Organizations: No    Attends Engineer, structural: Not on file    Marital Status: Never married  Catering manager Violence: Not on file    FAMILY HISTORY: Family History  Problem Relation Age of Onset   Breast cancer Sister 34 - 25   Cancer Paternal Grandmother        unknown type    Review of Systems  Constitutional:  Negative for appetite change, chills, fatigue, fever and unexpected weight change.  HENT:   Negative for hearing loss, lump/mass and trouble swallowing.   Eyes:  Negative for eye problems and icterus.  Respiratory:  Negative for chest tightness, cough and shortness of breath.   Cardiovascular:  Negative for chest pain, leg swelling and palpitations.  Gastrointestinal:  Negative for abdominal distention, abdominal pain, constipation, diarrhea, nausea and vomiting.  Endocrine: Negative for hot flashes.  Genitourinary:  Negative for difficulty urinating.   Musculoskeletal:  Negative for arthralgias.  Skin:  Negative for itching and rash.  Neurological:  Negative for dizziness, extremity weakness, headaches and numbness.  Hematological:  Negative for adenopathy. Does not bruise/bleed easily.  Psychiatric/Behavioral:  Negative for depression. The patient is not nervous/anxious.       PHYSICAL EXAMINATION    Vitals:   12/25/23 1209  BP: 106/63  Pulse: (!) 55  Resp: 16  Temp: 97.7 F (36.5 C)   SpO2: 100%    PE deferred in lieu of counseling   LABORATORY DATA:  CBC    Component Value Date/Time   WBC 2.1 (L) 12/25/2023 1201   WBC 2.3 (L) 10/22/2023 2122   RBC 3.88 12/25/2023 1201   HGB 11.2 (L) 12/25/2023 1201   HCT 34.5 (L) 12/25/2023 1201   PLT 143 (L) 12/25/2023 1201   MCV 88.9 12/25/2023 1201   MCH 28.9 12/25/2023 1201   MCHC 32.5 12/25/2023 1201   RDW 12.3 12/25/2023 1201   LYMPHSABS 0.5 (L) 12/25/2023 1201   MONOABS 0.2 12/25/2023 1201   EOSABS 0.0 12/25/2023 1201   BASOSABS 0.0 12/25/2023 1201    CMP     Component Value Date/Time   NA 138 12/25/2023  1201   K 4.3 12/25/2023 1201   CL 104 12/25/2023 1201   CO2 30 12/25/2023 1201   GLUCOSE 89 12/25/2023 1201   BUN 12 12/25/2023 1201   CREATININE 0.74 12/25/2023 1201   CALCIUM 9.5 12/25/2023 1201   PROT 7.6 12/25/2023 1201   ALBUMIN 4.3 12/25/2023 1201   AST 15 12/25/2023 1201   ALT 10 12/25/2023 1201   ALKPHOS 76 12/25/2023 1201   BILITOT 0.4 12/25/2023 1201   GFRNONAA >60 12/25/2023 1201   GFRAA >60 12/23/2015 1151        ASSESSMENT and THERAPY PLAN:   Malignant neoplasm of upper-outer quadrant of right female breast (HCC) Breast cancer - She received neoadj KEYNOTE 522 regimen, RCB II, couldn't complete adj immunotherapy because of constellation of bothersome symptoms. She tried xeloda  but couldn't tolerate it at all.  Assessment and Plan Assessment & Plan Breast cancer Weak estrogen receptor staining. PET scan shows no active cancer. Right breast skin thickening and right hemithorax opacity likely due to radiation. Lymph nodes show no cancerous activity. Open to anti-estrogen therapy with low threshold for discontinuation if side effects occur. - Initiate anti-estrogen therapy with close monitoring for side effects. - Guardant reveal neg in March 2025 - Schedule port flush every 6-8 weeks. - She is reluctant to have her port removed at this time.  Fatigue Persistent fatigue likely  related to recent cancer treatment and recovery.  Dizziness Intermittent dizziness with movement. Normal brain MRI. Advised hydration and slow positional changes.  Depression and anxiety Ongoing depression and anxiety exacerbated by personal stressors. Therapist appointment scheduled.  Leukopenia and thrombocytopenia, intermittent at baseline Clinically asymptomatic, ok to monitor since it is not getting worse.  Goals of Care Desires to prevent cancer recurrence. Willing to try anti-estrogen therapy despite personal challenges. Concerned about managing health independently amidst stressors. FU in 6 weeks.        All questions were answered. The patient knows to call the clinic with any problems, questions or concerns. We can certainly see the patient much sooner if necessary.

## 2023-12-26 LAB — T4: T4, Total: 8.4 ug/dL (ref 4.5–12.0)

## 2023-12-28 ENCOUNTER — Ambulatory Visit: Admitting: Psychology

## 2023-12-28 ENCOUNTER — Telehealth: Payer: Self-pay | Admitting: Hematology and Oncology

## 2023-12-28 DIAGNOSIS — F3289 Other specified depressive episodes: Secondary | ICD-10-CM | POA: Diagnosis not present

## 2023-12-28 NOTE — Telephone Encounter (Signed)
 Confirmed with pt scheduled appt date and time

## 2023-12-28 NOTE — Progress Notes (Unsigned)
 [facility] Counselor Initial Adult Exam  Name: Maria Carter Date: 12/28/2023 MRN: [id number] DOB: 30-Nov-1970 PCP: Abram Abraham, NP-C  Time Spent: 10:00 am - 10:30 am : 30 minutes  {onmampm:26702} - *** {onmampm:26702} : *** Minutes  Guardian/Payee:  ***    Paperwork requested: {XBM:84132}  Reason for Visit /Presenting Problem: ***  Mental Status Exam: Appearance:   {PSY:22683}     Behavior:  {PSY:21022743}  Motor:  {PSY:22302}  Speech/Language:   {PSY:22685}  Affect:  {PSY:22687}  Mood:  {PSY:31886}  Thought process:  {PSY:31888}  Thought content:    {PSY:[phone]}  Sensory/Perceptual disturbances:    {PSY:863-382-3733}  Orientation:  {PSY:30297}  Attention:  {PSY:22877}  Concentration:  {PSY:828-041-2875}  Memory:  {[phone]}  Fund of knowledge:   {PSY:828-041-2875}  Insight:    {PSY:828-041-2875}  Judgment:   {PSY:828-041-2875}  Impulse Control:  {PSY:828-041-2875}   Reported Symptoms:  ***  Risk Assessment: Danger to Self:  {PSY:22692} Self-injurious Behavior: {PSY:22692} Danger to Others: {PSY:22692} Duty to Warn:{PSY:311194} Physical Aggression / Violence:{PSY:21197} Access to Firearms a concern: {PSY:21197} Gang Involvement:{PSY:21197} Patient / guardian was educated about steps to take if suicide or homicide risk level increases between visits: {Yes/No-Ex:120004} While future psychiatric events cannot be accurately predicted, the patient does not currently require acute inpatient psychiatric care and does not currently meet [place]  involuntary commitment criteria.  Substance Abuse History: Current substance abuse: {PSY:21197}    Caffeine: Tobacco: Alcohol: Substance use:  Past Psychiatric History:   {Past psych history:20559} Outpatient Providers:*** History of Psych Hospitalization: {PSY:21197} Psychological Testing: {PSY:21014032}   Abuse History:  Victim of: {Abuse History:314532}, {Type of abuse:20566}   Report needed:  {PSY:314532} Victim of Neglect:{yes no:314532} Perpetrator of {PSY:20566}  Witness / Exposure to Domestic Violence: {PSY:21197}  Protective Services Involvement: {PSY:21197} Witness to [employer] Violence:  {PSY:21197}  Family History:  Family History  Problem Relation Age of Onset   Breast cancer Sister 65 - 39   Cancer Paternal Grandmother        unknown type    Living situation: the patient {lives:315711::"lives with their family"}  Sexual Orientation: {Sexual Orientation:[phone]}  Relationship Status: {Desc; marital status:62}  Name of spouse / other:*** If a parent, number of children / ages:***  Support Systems: {DIABETES SUPPORT:20310}  Financial Stress:  {YES/NO:21197}  Income/Employment/Disability: Manufacturing engineer: [employer]  Educational History: Education: {PSY :31912}  Religion/Sprituality/World View: {CHL AMB RELIGION/[phone]}  Any cultural differences that may affect / interfere with treatment:  {Religious/Cultural:200019}  Recreation/Hobbies: {Woc hobbies:30428}  Stressors: {PATIENT STRESSORS:22669}  Strengths: {Patient Coping Strengths:[phone]}  Barriers:  ***   Legal History: Pending legal issue / charges: {PSY:20588} History of legal issue / charges: {Legal [phone]}  Medical History/Surgical History: {Desc; reviewed/not reviewed:60074} Past Medical History:  Diagnosis Date   Anxiety    Breast cancer (HCC)    right breast IDC   Depression    Hx of febrile seizure    1980   Hypothyroidism     Past Surgical History:  Procedure Laterality Date   BREAST BIOPSY Right 09/30/2022   US  RT BREAST BX W LOC DEV 1ST LESION IMG BX SPEC US  GUIDE 09/30/2022 [facility]   BREAST BIOPSY  05/20/2023   MM RT RADIOACTIVE SEED LOC MAMMO GUIDE 05/20/2023 [facility]   BREAST LUMPECTOMY Right 05/21/2023   BREAST LUMPECTOMY WITH RADIOACTIVE SEED AND SENTINEL LYMPH NODE BIOPSY  Right 05/21/2023   Procedure: RIGHT BREAST LUMPECTOMY WITH RADIOACTIVE SEED AND SENTINEL LYMPH NODE BIOPSY;  Surgeon: Oza Blumenthal, MD;  Location: [facility];  Service: General;  Laterality: Right;  LMA PEC BLOCK   EXCISION OF KELOID N/A 05/21/2023   Procedure: EXCISION CHEST WALL SCAR;  Surgeon: Oza Blumenthal, MD;  Location: [facility];  Service: General;  Laterality: N/A;   PORTACATH PLACEMENT N/A 10/23/2022   Procedure: INSERTION PORT-A-CATH;  Surgeon: Oza Blumenthal, MD;  Location: [facility];  Service: General;  Laterality: N/A;    Medications: Current Outpatient Medications  Medication Sig Dispense Refill   busPIRone (BUSPAR) 15 MG tablet Take by mouth. (Patient not taking: Reported on 12/01/2023)     desvenlafaxine (PRISTIQ) 100 MG 24 hr tablet Take 100 mg by mouth daily.     diclofenac  Sodium (VOLTAREN ) 1 % GEL Apply 0.5 grams (1 fingertip) to each hand and each foot twice daily for up to 12 weeks (Patient not taking: Reported on 12/01/2023) 400 g 0   levothyroxine  (SYNTHROID ) 25 MCG tablet Take 25 mcg by mouth daily before breakfast. Take 2 tablets once daily     Multiple Vitamins-Minerals (MULTIVITAMIN ADULTS PO) Take 1 tablet by mouth daily. (Patient not taking: Reported on 12/01/2023)     ondansetron  (ZOFRAN ) 8 MG tablet Take 1 tablet (8 mg total) by mouth every 8 (eight) hours as needed for nausea or vomiting. (Patient not taking: Reported on 12/01/2023) 30 tablet 2   prochlorperazine  (COMPAZINE ) 10 MG tablet Take 1 tablet (10 mg total) by mouth every 6 (six) hours as needed for nausea or vomiting. (Patient not taking: Reported on 12/01/2023) 30 tablet 0   tamoxifen  (NOLVADEX ) 20 MG tablet Take 1 tablet (20 mg total) by mouth daily. 45 tablet 0   No current facility-administered medications for this visit.    No Known Allergies  Diagnoses:  Depression F32.89  Psychiatric Treatment: {YES/NO:21197}, ***  Plan of Care:  ***  Narrative:  Maria Carter participated from office with therapist and consented to treatment. We reviewed the limits of confidentiality prior to the start of the evaluation. Maria Carter expressed understanding and agreement to proceed.   Patient just finished her treatments   I don't get out much due to my anxiety. I stay isolated which I know is not good.   Patient's parents live in [place] . Patient's father 55 years old was diagnosed with a brain tumor (went to [facility]) dementia,(6 years ago), then was dx'd with multiple myeloma, and now was told it has metatacized to his brain. Patient's father kept patient strong because ....  When patient received her breast cancer diagnosis she totally shut down and was not in communication with anyone for two weeks.   Patient never took any medication   4 kids, 33, 71, 21, 24 year olds. It's been really rough for them.   A follow-up was scheduled to create a treatment plan and begin treatment. Therapist answered  and all questions during the evaluation and contact information was provided.    Melbert Botelho Bethena Brothers

## 2024-01-04 ENCOUNTER — Other Ambulatory Visit: Payer: Self-pay | Admitting: *Deleted

## 2024-01-04 ENCOUNTER — Ambulatory Visit (INDEPENDENT_AMBULATORY_CARE_PROVIDER_SITE_OTHER): Admitting: Psychology

## 2024-01-04 DIAGNOSIS — F3289 Other specified depressive episodes: Secondary | ICD-10-CM

## 2024-01-04 NOTE — Progress Notes (Unsigned)
 [facility] Counselor/Therapist Progress Note  Patient ID: Maria Carter, MRN: [id number]    Date: 01/04/24  Time Spent: 10:02 am -10:   minutes  Treatment Type: Individual Therapy.  Reported Symptoms: ***  Mental Status Exam: Appearance:     Casual  Behavior: Appropriate  Motor: Normal  Speech/Language:  Normal Rate  Affect: Appropriate  Mood: normal  Thought process: normal  Thought content:   WNL  Sensory/Perceptual disturbances:   WNL  Orientation: oriented to person, place, and time/date  Attention: Good  Concentration: Good  Memory: WNL  Fund of knowledge:  Good  Insight:   Good  Judgment:  Good  Impulse Control: Good   Risk Assessment: Danger to Self:  No Self-injurious Behavior: No Danger to Others: No Duty to Warn:no Physical Aggression / Violence:No  Access to Firearms a concern: No  Gang Involvement:No   Subjective:   Maria Carter participated from home, via video, and consented to treatment. I discussed the limitations of evaluation and management by telemedicine and the availability of in person appointments. The patient expressed understanding and agreed to proceed.  Therapist participated from home office.  Maria Carter reviewed the events of the past week.   Patient was feeling anxious because she learned last week that her father will need palliative care and/or hospice care. Patient also has a cousin, Maria Carter, who patient considers "like a sister to her"  and she has cancer and she asks a lot of the patient (I.e. go with her to her appt.), always be there for her, etc. Patient also lost a boyfriend, Maria Carter, after one year of cancer treatments, because he "couldn't deal with her cancer diagnosis anymore". The relationship had lasted 9 years before Maria Carter, left the relationship. Patient's children "loved" Maria Carter, so patient lied to children and said Maria Carter left to take care of his elderly parents to save their impression of Maria Carter. Maria Carter   NO APPT  SCHEDULED DUE TO OL CALENDAR PROBLEMS, WILL EMAIL PT APPT IN 2 WEEKS AND WILL CHECK REGARDING RESOURCES DUE TO $45 copay.  Interventions: Cognitive Behavioral Therapy  Diagnosis:  No diagnosis found.  Psychiatric Treatment: {YES/NO:21197}, ***  Treatment Plan:  Client Abilities/Strengths Maria Carter ***  Support System: ***  Client Treatment Preferences OPT  Client Statement of Needs Maria Carter would like to ***   Treatment Level Weekly/Biweekly  Symptoms  ***   (Status: {Symptom Status:26744}) ***   (Status: {Symptom Status:26744})  Goals:   Maria Carter experiences symptoms of ***   Target Date: 12/27/24 Frequency: Weekly/Biweekly  Progress: 0 Modality: individual    Therapist will provide referrals for additional resources as appropriate.  Therapist will provide psycho-education regarding Kamy's diagnosis and corresponding treatment approaches and interventions. Fran Imus will support the patient's ability to achieve the goals identified. will employ CBT, BA, Problem-solving, Solution Focused, Mindfulness,  coping skills, & other evidenced-based practices will be used to promote progress towards healthy functioning to help manage decrease symptoms associated with their diagnosis.   Reduce overall level, frequency, and intensity of the feelings of depression, anxiety and panic evidenced by decreased overall symptoms from 6 to 7 days/week to 0 to 1 days/week per client report for at least 3 consecutive months. Verbally express understanding of the relationship between feelings of ***depression, ***anxiety and their impact on thinking patterns and behaviors. Verbalize an understanding of the role that distorted thinking plays in creating fears, excessive worry, and ruminations.  Maria Carter participated in the creation of the treatment plan)   Fran Imus  Fran Imus

## 2024-01-05 ENCOUNTER — Encounter (HOSPITAL_BASED_OUTPATIENT_CLINIC_OR_DEPARTMENT_OTHER): Admission: RE | Payer: Self-pay | Source: Home / Self Care

## 2024-01-05 ENCOUNTER — Ambulatory Visit (HOSPITAL_BASED_OUTPATIENT_CLINIC_OR_DEPARTMENT_OTHER): Admission: RE | Admit: 2024-01-05 | Source: Home / Self Care | Admitting: Surgery

## 2024-01-05 SURGERY — REMOVAL PORT-A-CATH
Anesthesia: Monitor Anesthesia Care

## 2024-01-06 ENCOUNTER — Encounter: Payer: Self-pay | Admitting: Hematology and Oncology

## 2024-01-06 ENCOUNTER — Telehealth: Payer: Self-pay | Admitting: Hematology and Oncology

## 2024-01-08 ENCOUNTER — Telehealth: Payer: Self-pay | Admitting: *Deleted

## 2024-01-08 ENCOUNTER — Inpatient Hospital Stay: Attending: Physician Assistant

## 2024-01-08 DIAGNOSIS — Z95828 Presence of other vascular implants and grafts: Secondary | ICD-10-CM

## 2024-01-08 DIAGNOSIS — C50411 Malignant neoplasm of upper-outer quadrant of right female breast: Secondary | ICD-10-CM | POA: Insufficient documentation

## 2024-01-08 DIAGNOSIS — Z452 Encounter for adjustment and management of vascular access device: Secondary | ICD-10-CM | POA: Diagnosis not present

## 2024-01-08 MED ORDER — HEPARIN SOD (PORK) LOCK FLUSH 100 UNIT/ML IV SOLN
500.0000 [IU] | Freq: Once | INTRAVENOUS | Status: AC
Start: 2024-01-08 — End: 2024-01-08
  Administered 2024-01-08: 500 [IU]

## 2024-01-08 MED ORDER — SODIUM CHLORIDE 0.9% FLUSH
10.0000 mL | Freq: Once | INTRAVENOUS | Status: AC
  Administered 2024-01-08: 10 mL

## 2024-01-08 NOTE — Patient Instructions (Signed)

## 2024-01-08 NOTE — Telephone Encounter (Addendum)
 This RN spoke with pt per her call stating she is noticing increased feelings of anxiety since completing therapy - with having a "full blown panic attack yesterday".  She states known history of anxiety with current therapy of Buspar bid under the care of Dr Leticia Raven.  She has not seen Dr Leticia Raven since before breast cancer diagnosis.  Per discussion this RN validated her feelings and episodes.  Discussed need to contact Dr Leticia Raven for appropriate follow up for medication management.  This RN will inquire with provider for any medications to hold her over until she is seen by Dr Leticia Raven.  Pt is in agreement to being referred to our SW for further counseling regarding "feeling like now that everything is slowed down - I feel overwhelmed. "  Note she also states her partner who was a source of support "has left me".   Above reviewed with [facility]/NP - obtained small quantity of alprazolam. Called pt ( she called Dr Alray Askew - office is closed on Fridays) - discussed use for over the weekend to allow her to contact MD on Monday.  Pt verbalized appreciation.

## 2024-01-11 ENCOUNTER — Inpatient Hospital Stay: Admitting: Licensed Clinical Social Worker

## 2024-01-11 NOTE — Progress Notes (Signed)
 [facility] Clinical Social Work  Clinical Social Work was referred by Engineer, civil (consulting) for anxiety.  Clinical Social Worker contacted patient by phone to offer support and assess for needs.   Pt reports that she has had increased worries and anxiety since finishing treatment. CSW normalized worries in survivorship and discussed options for support. Pt has started seeing a counselor (2 visits so far) and is figuring out follow-up due to insurance costs. Pt did find relief with medication prescribed this weekend and has called Dr. Charleston Conrad office to follow-up on if they can prescribe going forward. CSW discussed potential short term visits while other counseling is being finalized. Pt is interested but said she will call back once she checks her calendar and gets done with some other appointments.  CSW also provided information on breast cancer support group as well as other support programs (Reiki, yoga, tai chi, Susen Epstein programs) that may be helpful in reducing stress and increasing social support. E-mailed support calendars to pt today.     Ashdon Gillson E Kimo Bancroft, LCSW  Clinical Social Worker [facility]

## 2024-01-15 ENCOUNTER — Encounter: Payer: Self-pay | Admitting: Hematology and Oncology

## 2024-01-19 ENCOUNTER — Ambulatory Visit (AMBULATORY_SURGERY_CENTER): Admitting: *Deleted

## 2024-01-19 VITALS — Ht 63.0 in | Wt 164.0 lb

## 2024-01-19 DIAGNOSIS — Z1211 Encounter for screening for malignant neoplasm of colon: Secondary | ICD-10-CM

## 2024-01-19 MED ORDER — NA SULFATE-K SULFATE-MG SULF 17.5-3.13-1.6 GM/177ML PO SOLN: 1.0000 | Freq: Once | ORAL | 0 refills | Status: AC

## 2024-01-19 NOTE — Progress Notes (Signed)
 Pt's name and DOB verified at the beginning of the pre-visit wit 2 identifiers  Permission given to speak with  Pt denies any difficulty with ambulating,sitting, laying down or rolling side to side  Pt has no issues moving head neck or swallowing  No egg or soy allergy known to patient   No issues known to pt with past sedation with any surgeries or procedures  No FH of Malignant Hyperthermia  Pt is not on home 02   Pt is not on blood thinners   Pt denies issues with constipation   Pt is not on dialysis  Pt denise any abnormal heart rhythms   Pt denies any upcoming cardiac testing  Patient's chart reviewed by Rogena Class CNRA prior to pre-visit and patient appropriate for the [facility].  Pre-visit completed and red dot placed by patient's name on their procedure day (on provider's schedule).    Visit by phone  Pt states weight is 164 lb  IInstructions reviewed. Pt given  both [facility] main # and MD on call # prior to instructions.  Pt states understanding of instructions. Instructed pt to review instructions again prior to procedure and call main # given if has questions.. Pt states they will.   Instructed pt on where to find instructions on My Chart.

## 2024-01-20 ENCOUNTER — Ambulatory Visit (INDEPENDENT_AMBULATORY_CARE_PROVIDER_SITE_OTHER): Admitting: Obstetrics and Gynecology

## 2024-01-20 ENCOUNTER — Encounter: Payer: Self-pay | Admitting: Obstetrics and Gynecology

## 2024-01-20 ENCOUNTER — Other Ambulatory Visit (HOSPITAL_COMMUNITY)
Admission: RE | Admit: 2024-01-20 | Discharge: 2024-01-20 | Disposition: A | Discharge status: Home / Self Care | Source: Ambulatory Visit | Attending: Obstetrics and Gynecology | Admitting: Obstetrics and Gynecology

## 2024-01-20 ENCOUNTER — Encounter: Payer: Self-pay | Admitting: Gastroenterology

## 2024-01-20 VITALS — BP 110/80 | HR 67 | Ht 62.25 in | Wt 161.0 lb

## 2024-01-20 DIAGNOSIS — Z1211 Encounter for screening for malignant neoplasm of colon: Secondary | ICD-10-CM

## 2024-01-20 DIAGNOSIS — C50411 Malignant neoplasm of upper-outer quadrant of right female breast: Secondary | ICD-10-CM

## 2024-01-20 DIAGNOSIS — D500 Iron deficiency anemia secondary to blood loss (chronic): Secondary | ICD-10-CM | POA: Diagnosis not present

## 2024-01-20 DIAGNOSIS — Z01419 Encounter for gynecological examination (general) (routine) without abnormal findings: Secondary | ICD-10-CM | POA: Insufficient documentation

## 2024-01-20 DIAGNOSIS — E2839 Other primary ovarian failure: Secondary | ICD-10-CM

## 2024-01-20 DIAGNOSIS — N760 Acute vaginitis: Secondary | ICD-10-CM

## 2024-01-20 DIAGNOSIS — Z1331 Encounter for screening for depression: Secondary | ICD-10-CM | POA: Diagnosis not present

## 2024-01-20 DIAGNOSIS — Z9289 Personal history of other medical treatment: Secondary | ICD-10-CM | POA: Diagnosis not present

## 2024-01-20 DIAGNOSIS — N898 Other specified noninflammatory disorders of vagina: Secondary | ICD-10-CM | POA: Diagnosis not present

## 2024-01-20 DIAGNOSIS — N951 Menopausal and female climacteric states: Secondary | ICD-10-CM

## 2024-01-20 NOTE — Progress Notes (Signed)
 53 y.o. y.o. female here for annual medicare gyn exam. Single. Partner left at end of chemo for breast cancer.  No LMP recorded (lmp unknown). Patient is postmenopausal.   Breast cancer diagnosed last year. Sister passed of breast cancer with mets to brain Pelvic discharge: + and would like nu swab collected  Last mammogram: Breast canc estrogen+ sp lumpectomy with chemo and radiation stopped tamoxifen  3/25 last MMG Reports mood swings and hot flashes To ask oncologist about effexor and veozzah Stopped periods Last colonoscopy: states she has first one in 6/25 Dxa: baseline ordered today PUS referral placed for anemia Not sexually active  Body mass index is 29.21 kg/m.     01/20/2024    9:03 AM 12/01/2023   11:08 AM 02/28/2022   10:14 AM  Depression screen PHQ 2/9  Decreased Interest 3 3 3   Down, Depressed, Hopeless 3 3 3   PHQ - 2 Score 6 6 6   Altered sleeping 2 2 2   Tired, decreased energy 3 3 3   Change in appetite 1 0 1  Feeling bad or failure about yourself  3 3 3   Trouble concentrating 3 3 3   Moving slowly or fidgety/restless 2 3 2   Suicidal thoughts 0 0 0  PHQ-9 Score 20 20 20   Difficult doing work/chores Somewhat difficult      Blood pressure 110/80, pulse 67, height 5' 2.25" (1.581 m), weight 161 lb (73 kg), SpO2 98%.  No results found for: "DIAGPAP", "HPVHIGH", "ADEQPAP"  GYN HISTORY: No results found for: "DIAGPAP", "HPVHIGH", "ADEQPAP"  OB History  Gravida Para Term Preterm AB Living  6 5 4 1 1 4   SAB IAB Ectopic Multiple Live Births  0 1       # Outcome Date GA Lbr Len/2nd Weight Sex Type Anes PTL Lv  6 Preterm         FD  5 IAB           4 Term           3 Term           2 Term           1 Term             Past Medical History:  Diagnosis Date   Anxiety    Breast cancer (HCC)    right breast IDC   Depression    Hx of febrile seizure    1980   Hypothyroidism     Past Surgical History:  Procedure Laterality Date   BREAST BIOPSY  Right 09/30/2022   US  RT BREAST BX W LOC DEV 1ST LESION IMG BX SPEC US  GUIDE 09/30/2022 [facility]MO[facility]   BREAST BIOPSY  05/20/2023   MM RT RADIOACTIVE SEED LOC MAMMO GUIDE 05/20/2023 [facility]   BREAST LUMPECTOMY Right 05/21/2023   BREAST LUMPECTOMY WITH RADIOACTIVE SEED AND SENTINEL LYMPH NODE BIOPSY Right 05/21/2023   Procedure: RIGHT BREAST LUMPECTOMY WITH RADIOACTIVE SEED AND SENTINEL LYMPH NODE BIOPSY;  Surgeon: Oza Blumenthal, MD;  Location: [facility];  Service: General;  Laterality: Right;  LMA PEC BLOCK   EXCISION OF KELOID N/A 05/21/2023   Procedure: EXCISION CHEST WALL SCAR;  Surgeon: Oza Blumenthal, MD;  Location: [facility];  Service: General;  Laterality: N/A;   PORTACATH PLACEMENT N/A 10/23/2022   Procedure: INSERTION PORT-A-CATH;  Surgeon: Oza Blumenthal, MD;  Location: [facility];  Service: General;  Laterality: N/A;    Current Outpatient Medications on File  Prior to Visit  Medication Sig Dispense Refill   busPIRone (BUSPAR) 15 MG tablet Take by mouth.     desvenlafaxine (PRISTIQ) 100 MG 24 hr tablet Take 100 mg by mouth daily.     gabapentin  (NEURONTIN ) 100 MG capsule Take 100 mg by mouth.     levothyroxine  (SYNTHROID ) 25 MCG tablet Take 25 mcg by mouth daily before breakfast. Take 2 tablets once daily     Multiple Vitamins-Minerals (MULTIVITAMIN ADULTS PO) Take 1 tablet by mouth daily.     No current facility-administered medications on file prior to visit.    Social History   Socioeconomic History   Marital status: Single    Spouse name: Not on file   Number of children: Not on file   Years of education: Not on file   Highest education level: 12th grade  Occupational History   Not on file  Tobacco Use   Smoking status: Never   Smokeless tobacco: Never  Vaping Use   Vaping status: Never Used  Substance and Sexual Activity   Alcohol use: Not Currently   Drug use: No   Sexual activity: Not  Currently    Partners: Male    Birth control/protection: Post-menopausal, Abstinence  Other Topics Concern   Not on file  Social History Narrative   Not on file   Social Drivers of Health   Financial Resource Strain: High Risk (12/18/2023)   Overall Financial Resource Strain (CARDIA)    Difficulty of Paying Living Expenses: Hard  Food Insecurity: Food Insecurity Present (12/18/2023)   Hunger Vital Sign    Worried About Running Out of Food in the Last Year: Sometimes true    Ran Out of Food in the Last Year: Sometimes true  Transportation Needs: Unmet Transportation Needs (12/18/2023)   PRAPARE - Administrator, Civil Service (Medical): Yes    Lack of Transportation (Non-Medical): No  Physical Activity: Unknown (11/27/2023)   Exercise Vital Sign    Days of Exercise per Week: 0 days    Minutes of Exercise per Session: Not on file  Stress: Stress Concern Present (11/27/2023)   [employer] of Occupational Health - Occupational Stress Questionnaire    Feeling of Stress : Rather much  Social Connections: Socially Isolated (11/27/2023)   Social Connection and Isolation Panel [NHANES]    Frequency of Communication with Friends and Family: Once a week    Frequency of Social Gatherings with Friends and Family: Never    Attends Religious Services: Never    Database administrator or Organizations: No    Attends Engineer, structural: Not on file    Marital Status: Never married  Catering manager Violence: Not on file    Family History  Problem Relation Age of Onset   Multiple myeloma Father    Breast cancer Sister 64 - 23   Cancer Paternal Grandmother        unknown type     No Known Allergies    Patient's last menstrual period was No LMP recorded (lmp unknown). Patient is postmenopausal..             Review of Systems Alls systems reviewed and are negative.     Physical Exam Constitutional:      Appearance: Normal appearance.  Genitourinary:      Vulva and urethral meatus normal.     No lesions in the vagina.     Right Labia: No rash, lesions or skin changes.    Left Labia: No  lesions, skin changes or rash.    No vaginal discharge or tenderness.     No vaginal prolapse present.    No vaginal atrophy present.     Right Adnexa: not tender, not palpable and no mass present.    Left Adnexa: not tender, not palpable and no mass present.    No cervical motion tenderness or discharge.     Uterus is not enlarged, tender or irregular.  Breasts:    Right: Normal.     Left: Normal.  HENT:     Head: Normocephalic.  Neck:     Thyroid : No thyroid  mass, thyromegaly or thyroid  tenderness.  Cardiovascular:     Rate and Rhythm: Normal rate and regular rhythm.     Heart sounds: Normal heart sounds, S1 normal and S2 normal.  Pulmonary:     Effort: Pulmonary effort is normal.     Breath sounds: Normal breath sounds and air entry.  Chest:     Comments: Right breast scar from lumpectomy Abdominal:     General: There is no distension.     Palpations: Abdomen is soft. There is no mass.     Tenderness: There is no abdominal tenderness. There is no guarding or rebound.  Musculoskeletal:        General: Normal range of motion.     Cervical back: Full passive range of motion without pain, normal range of motion and neck supple. No tenderness.     Right lower leg: No edema.     Left lower leg: No edema.  Neurological:     Mental Status: She is alert.  Skin:    General: Skin is warm.  Psychiatric:        Mood and Affect: Mood normal.        Behavior: Behavior normal.        Thought Content: Thought content normal.  Vitals and nursing note reviewed. Exam conducted with a chaperone present.    Joy, CMA was present for the entire exam   A:         Well Woman medicare GYN exam                             P:        Pap smear collected today Encouraged annual mammogram screening Colon cancer screening up-to-date DXA ordered today Labs and  immunizations to do with PMD Patient to discuss veozzah and effexor with oncologist as options for the hot flashes and let usknow Discussed breast self exams Encouraged healthy lifestyle practices Encouraged Vit D and Calcium   No follow-ups on file.  Reinaldo Caras

## 2024-01-21 LAB — SURESWAB® ADVANCED VAGINITIS PLUS,TMA
C. trachomatis RNA, TMA: NOT DETECTED
CANDIDA SPECIES: NOT DETECTED
Candida glabrata: NOT DETECTED
N. gonorrhoeae RNA, TMA: NOT DETECTED
SURESWAB(R) ADV BACTERIAL VAGINOSIS(BV),TMA: POSITIVE — AB
TRICHOMONAS VAGINALIS (TV),TMA: NOT DETECTED

## 2024-01-22 ENCOUNTER — Ambulatory Visit: Payer: Self-pay | Admitting: Obstetrics and Gynecology

## 2024-01-22 DIAGNOSIS — F411 Generalized anxiety disorder: Secondary | ICD-10-CM | POA: Diagnosis not present

## 2024-01-22 DIAGNOSIS — B9689 Other specified bacterial agents as the cause of diseases classified elsewhere: Secondary | ICD-10-CM

## 2024-01-22 DIAGNOSIS — F330 Major depressive disorder, recurrent, mild: Secondary | ICD-10-CM | POA: Diagnosis not present

## 2024-01-22 MED ORDER — METRONIDAZOLE 500 MG PO TABS: 500.0000 mg | ORAL_TABLET | Freq: Two times a day (BID) | ORAL | 0 refills | Status: AC

## 2024-01-25 NOTE — Addendum Note (Signed)
 Addended by: Reinaldo Caras on: 01/25/2024 04:02 PM   Modules accepted: Level of Service

## 2024-01-28 ENCOUNTER — Ambulatory Visit (HOSPITAL_BASED_OUTPATIENT_CLINIC_OR_DEPARTMENT_OTHER)
Admission: RE | Admit: 2024-01-28 | Discharge: 2024-01-28 | Disposition: A | Discharge status: Home / Self Care | Source: Ambulatory Visit | Attending: Obstetrics and Gynecology | Admitting: Obstetrics and Gynecology

## 2024-01-28 ENCOUNTER — Telehealth: Payer: Self-pay

## 2024-01-28 DIAGNOSIS — E2839 Other primary ovarian failure: Secondary | ICD-10-CM | POA: Diagnosis not present

## 2024-01-28 DIAGNOSIS — M85852 Other specified disorders of bone density and structure, left thigh: Secondary | ICD-10-CM | POA: Diagnosis not present

## 2024-01-28 DIAGNOSIS — Z780 Asymptomatic menopausal state: Secondary | ICD-10-CM | POA: Diagnosis not present

## 2024-01-28 NOTE — Telephone Encounter (Signed)
 Pt called and reports she is having right breast "fullness and pain" to her right breast and axilla. She denies ever being assessed for lymphedema, denies redness, fever. She was offered soonest appt for 02/04/24 at 1400 with Alwin Baars, NP and accepted. She was advised to go to ED if she should develop redness/ fever/ any secretions.

## 2024-01-29 LAB — CYTOLOGY - PAP
Comment: NEGATIVE
Diagnosis: NEGATIVE
Diagnosis: REACTIVE
High risk HPV: NEGATIVE

## 2024-02-02 DIAGNOSIS — F439 Reaction to severe stress, unspecified: Secondary | ICD-10-CM | POA: Diagnosis not present

## 2024-02-02 DIAGNOSIS — F322 Major depressive disorder, single episode, severe without psychotic features: Secondary | ICD-10-CM | POA: Diagnosis not present

## 2024-02-02 DIAGNOSIS — F411 Generalized anxiety disorder: Secondary | ICD-10-CM | POA: Diagnosis not present

## 2024-02-04 ENCOUNTER — Telehealth: Payer: Self-pay | Admitting: Radiation Oncology

## 2024-02-04 ENCOUNTER — Telehealth: Payer: Self-pay

## 2024-02-04 ENCOUNTER — Telehealth: Payer: Self-pay | Admitting: *Deleted

## 2024-02-04 ENCOUNTER — Inpatient Hospital Stay: Admitting: Adult Health

## 2024-02-04 NOTE — Telephone Encounter (Signed)
 6/5 Received 2nd Medical Record Release form for dates; 06/11/23-08/04/23, already sent to [facility] by Arrayl J on 12/16/23.

## 2024-02-04 NOTE — Telephone Encounter (Signed)
 Pt called and stated that ride did not show and will address concerns with provider at Mondays appt.

## 2024-02-04 NOTE — Telephone Encounter (Signed)
 Verbally confirmed appt for 6/9

## 2024-02-08 ENCOUNTER — Inpatient Hospital Stay: Attending: Physician Assistant

## 2024-02-08 ENCOUNTER — Other Ambulatory Visit: Payer: Self-pay | Admitting: Hematology and Oncology

## 2024-02-08 ENCOUNTER — Inpatient Hospital Stay: Attending: Physician Assistant | Admitting: Hematology and Oncology

## 2024-02-08 ENCOUNTER — Other Ambulatory Visit: Payer: Self-pay | Admitting: *Deleted

## 2024-02-08 VITALS — BP 118/66 | HR 71 | Temp 98.5°F | Resp 16 | Wt 164.7 lb

## 2024-02-08 DIAGNOSIS — Z79899 Other long term (current) drug therapy: Secondary | ICD-10-CM | POA: Diagnosis not present

## 2024-02-08 DIAGNOSIS — Z9221 Personal history of antineoplastic chemotherapy: Secondary | ICD-10-CM | POA: Diagnosis not present

## 2024-02-08 DIAGNOSIS — Z170 Estrogen receptor positive status [ER+]: Secondary | ICD-10-CM | POA: Diagnosis not present

## 2024-02-08 DIAGNOSIS — C50411 Malignant neoplasm of upper-outer quadrant of right female breast: Secondary | ICD-10-CM | POA: Diagnosis present

## 2024-02-08 DIAGNOSIS — E039 Hypothyroidism, unspecified: Secondary | ICD-10-CM

## 2024-02-08 DIAGNOSIS — Z452 Encounter for adjustment and management of vascular access device: Secondary | ICD-10-CM | POA: Diagnosis not present

## 2024-02-08 DIAGNOSIS — Z923 Personal history of irradiation: Secondary | ICD-10-CM | POA: Diagnosis not present

## 2024-02-08 DIAGNOSIS — Z79810 Long term (current) use of selective estrogen receptor modulators (SERMs): Secondary | ICD-10-CM | POA: Diagnosis not present

## 2024-02-08 DIAGNOSIS — Z95828 Presence of other vascular implants and grafts: Secondary | ICD-10-CM

## 2024-02-08 DIAGNOSIS — Z1732 Human epidermal growth factor receptor 2 negative status: Secondary | ICD-10-CM | POA: Diagnosis not present

## 2024-02-08 DIAGNOSIS — Z1722 Progesterone receptor negative status: Secondary | ICD-10-CM | POA: Diagnosis not present

## 2024-02-08 LAB — CBC WITH DIFFERENTIAL (CANCER CENTER ONLY)
Abs Immature Granulocytes: 0.01 10*3/uL (ref 0.00–0.07)
Basophils Absolute: 0 10*3/uL (ref 0.0–0.1)
Basophils Relative: 1 %
Eosinophils Absolute: 0 10*3/uL (ref 0.0–0.5)
Eosinophils Relative: 2 %
HCT: 33 % — ABNORMAL LOW (ref 36.0–46.0)
Hemoglobin: 11 g/dL — ABNORMAL LOW (ref 12.0–15.0)
Immature Granulocytes: 1 %
Lymphocytes Relative: 26 %
Lymphs Abs: 0.5 10*3/uL — ABNORMAL LOW (ref 0.7–4.0)
MCH: 29 pg (ref 26.0–34.0)
MCHC: 33.3 g/dL (ref 30.0–36.0)
MCV: 87.1 fL (ref 80.0–100.0)
Monocytes Absolute: 0.2 10*3/uL (ref 0.1–1.0)
Monocytes Relative: 8 %
Neutro Abs: 1.3 10*3/uL — ABNORMAL LOW (ref 1.7–7.7)
Neutrophils Relative %: 62 %
Platelet Count: 138 10*3/uL — ABNORMAL LOW (ref 150–400)
RBC: 3.79 MIL/uL — ABNORMAL LOW (ref 3.87–5.11)
RDW: 12.6 % (ref 11.5–15.5)
WBC Count: 2 10*3/uL — ABNORMAL LOW (ref 4.0–10.5)
nRBC: 0 % (ref 0.0–0.2)

## 2024-02-08 LAB — CMP (CANCER CENTER ONLY)
ALT: 13 U/L (ref 0–44)
AST: 19 U/L (ref 15–41)
Albumin: 4.3 g/dL (ref 3.5–5.0)
Alkaline Phosphatase: 79 U/L (ref 38–126)
Anion gap: 4 — ABNORMAL LOW (ref 5–15)
BUN: 13 mg/dL (ref 6–20)
CO2: 31 mmol/L (ref 22–32)
Calcium: 9.5 mg/dL (ref 8.9–10.3)
Chloride: 104 mmol/L (ref 98–111)
Creatinine: 0.8 mg/dL (ref 0.44–1.00)
GFR, Estimated: >60 mL/min (ref >60)
Glucose, Bld: 111 mg/dL — ABNORMAL HIGH (ref 70–99)
Potassium: 3.7 mmol/L (ref 3.5–5.1)
Sodium: 139 mmol/L (ref 135–145)
Total Bilirubin: 0.4 mg/dL (ref 0.0–1.2)
Total Protein: 7.4 g/dL (ref 6.5–8.1)

## 2024-02-08 LAB — TSH: TSH: 14.7 u[IU]/mL — ABNORMAL HIGH (ref 0.350–4.500)

## 2024-02-08 MED ORDER — HEPARIN SOD (PORK) LOCK FLUSH 100 UNIT/ML IV SOLN
500.0000 [IU] | Freq: Once | INTRAVENOUS | Status: AC
  Administered 2024-02-08: 500 [IU]

## 2024-02-08 MED ORDER — SODIUM CHLORIDE 0.9% FLUSH
10.0000 mL | Freq: Once | INTRAVENOUS | Status: AC
  Administered 2024-02-08: 10 mL

## 2024-02-08 MED ORDER — TAMOXIFEN CITRATE 20 MG PO TABS: 20.0000 mg | ORAL_TABLET | Freq: Every day | ORAL | 1 refills | Status: AC

## 2024-02-08 NOTE — Assessment & Plan Note (Signed)
  Assessment and Plan Assessment & Plan Breast cancer Weak estrogen receptor staining. PET scan shows no active cancer. Right breast skin thickening and right hemithorax opacity likely due to radiation. Lymph nodes show no cancerous activity. Open to anti-estrogen therapy with low threshold for discontinuation if side effects occur. - Guardant reveal neg in March 2025 - Schedule port flush every 6-8 weeks. - She is reluctant to have her port removed at this time  Lymphedema Right breast swelling and heaviness due to lymphedema, with discomfort and pain. Inconsistent compression bra use may exacerbate symptoms. - Refer to physical therapy for lymphedema management. - Advise daily use of compression bra. - Instruct to contact for new prescription for compression bras if needed.  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 Reassured count sufficient for infection control.  Follow-up Regular follow-up appointments and port flushes to continue. She plans to travel and will decide on port removal later. - Schedule port flush every six weeks. - Plan follow-up appointment in September after next blood test. - Discuss port removal when she is ready.

## 2024-02-08 NOTE — Progress Notes (Signed)
 [facility] Cancer Follow up:    Abram Abraham, NP-C [address] [place] [address]   DIAGNOSIS:  Cancer Staging  Malignant neoplasm of upper-outer quadrant of right female breast [facility]) Staging form: Breast, AJCC 8th Edition - Clinical: Stage IIB (cT2, cN0, cM0, G3, ER+, PR-, HER2-) - Signed by Murleen Arms, MD on 10/08/2022 Stage prefix: Initial diagnosis Histologic grading system: 3 grade system    SUMMARY OF ONCOLOGIC HISTORY: Oncology History  Malignant neoplasm of upper-outer quadrant of right female breast (HCC)  09/26/2022 Mammogram   Patient had a palpable right breast mass and hence underwent bilateral diagnostic mammogram.  This confirmed a suspicious 3 cm right upper outer quadrant mass and an abnormal right axillary lymph node with cortical thickening.  Tissue sampling of both the breast mass and axillary lymph node were recommended.  Left breast was normal.   09/26/2022 Breast US    Ultrasound breast confirmed the above-mentioned findings.   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  Prognostic showed ER 50% positive weak staining PR 0% negative HER2 1+ by IHC and Ki-67 of 70%   10/08/2022 Cancer Staging   Staging form: Breast, AJCC 8th Edition - Clinical: Stage IIB (cT2, cN0, cM0, G3, ER+, PR-, HER2-) - Signed by Murleen Arms, MD on 10/08/2022 Stage prefix: Initial diagnosis Histologic grading system: 3 grade system   10/24/2022 - 11/14/2022 Chemotherapy   Patient is on Treatment Plan : BREAST Pembrolizumab  (200) D1 + Carboplatin  (5) D1 + Paclitaxel  (80) D1,8,15 q21d X 4 cycles / Pembrolizumab  (200) D1 + AC D1 q21d x 4 cycles      Genetic Testing   [facility] Common Cancer Panel+RNA was Negative. Report date is 10/16/2022.  The Common Hereditary Cancers Panel offered by [facility] includes sequencing and/or deletion duplication testing of the following 48 genes: APC, ATM, AXIN2, BAP1,  BARD1, BMPR1A, BRCA1, BRCA2, BRIP1, CDH1, CDK4, CDKN2A (p14ARF and p16INK4a only), CHEK2, CTNNA1, DICER1, EPCAM (Deletion/duplication testing only), FH, GREM1 (promoter region duplication testing only), HOXB13, KIT, MBD4, MEN1, MLH1, MSH2, MSH3, MSH6, MUTYH, NF1, NHTL1, PALB2, PDGFRA, PMS2, POLD1, POLE, PTEN, RAD51C, RAD51D, SDHA (sequencing analysis only except exon 14), SDHB, SDHC, SDHD, SMAD4, SMARCA4. STK11, TP53, TSC1, TSC2, and VHL.   11/21/2022 - 04/08/2023 Chemotherapy   Patient is on Treatment Plan : BREAST Pembrolizumab  (200) D1 + Carboplatin  (1.5) D1,8,15 + Paclitaxel  (80) D1,8,15 q21d X 4 cycles / Pembrolizumab  (200) D1 + AC D1 q21d x 4 cycles     05/19/2023 - 09/28/2023 Chemotherapy   Patient is on Treatment Plan : BREAST Pembrolizumab  (200) q21d x 27 weeks     05/21/2023 Surgery   Right lumpectomy: metaplastic IDC with chondroid differentiation, 1.8 cm, grade 3, margins negative, no LVI, 8 SLN negative, repeat prognostic panel: ER 40% positive, weak staining, PR 0% negative, Ki-67 80%, HER2 negative (0). RCB-II   07/06/2023 - 08/04/2023 Radiation Therapy   Adjuvant radiation therapy     CURRENT THERAPY:  INTERVAL HISTORY: Maria Carter 53 y.o. female returns for follow up  History of Present Illness   History of Present Illness  Maria Carter is a 53 year old female who presents with right-sided breast swelling and pain.  She experiences significant swelling and a feeling of heaviness in her right breast, describing it as 'really, really swollen' and 'heavy.' The pain is described as 'very uncomfortable' and 'scary.' She is unsure if the swelling is causing chest pain, as  she also reports experiencing chest pain. She has not engaged in physical therapy for this issue and has not been consistently wearing a compression bra, although wearing it over the weekend provided some relief.  She mentions a small bump on her arm, which she believes is not significant but is causing some  concern.  Her most recent blood counts include a hemoglobin level of 11 and a white blood cell count of 2000. She has a history of low white blood cell count dating back to 2009. She has completed all cancer treatments and is currently managing her health post-cancer treatment.  She is planning to visit her parents soon and currently has a port in place, which she plans to keep for now due to her travel plans.  No breathing difficulties or bowel movement issues.   Rest of the pertinent 10 point ROS reviewed and negative  Patient Active Problem List   Diagnosis Date Noted   Port-A-Cath in place 10/24/2022   Genetic testing 10/16/2022   Malignant neoplasm of upper-outer quadrant of right female breast (HCC) 10/06/2022   Thrombocytopenia (HCC) 03/05/2022   Leukopenia 03/05/2022   Hot flashes 02/28/2022   Hair thinning 02/28/2022   Amenorrhea 02/28/2022   Screening for colon cancer 02/28/2022   Multiple nevi 02/28/2022   PTSD (post-traumatic stress disorder) 02/08/2021   Mild episode of recurrent major depressive disorder (HCC) 09/12/2013   Attention deficit disorder 04/25/2013   Migraine 02/05/2013   KELOID SCAR 05/09/2010   URINALYSIS, ABNORMAL 05/09/2010   Anxiety state 03/01/2010   DEPRESSION 03/01/2010   Headache 03/01/2010   Intermittent chest pain 03/01/2010    has no known allergies.  MEDICAL HISTORY: Past Medical History:  Diagnosis Date   Anxiety    Breast cancer (HCC)    right breast IDC   Depression    Hx of febrile seizure    1980   Hypothyroidism     SURGICAL HISTORY: Past Surgical History:  Procedure Laterality Date   BREAST BIOPSY Right 09/30/2022   US  RT BREAST BX W LOC DEV 1ST LESION IMG BX SPEC US  GUIDE 09/30/2022 [facility]MO[facility]AP[facility]   BREAST BIOPSY  05/20/2023   MM RT RADIOACTIVE SEED LOC MAMMO GUIDE 05/20/2023 [facility] MAMMOGRAPHY   BREAST LUMPECTOMY Right 05/21/2023   BREAST LUMPECTOMY WITH RADIOACTIVE SEED AND SENTINEL LYMPH NODE BIOPSY Right  05/21/2023   Procedure: RIGHT BREAST LUMPECTOMY WITH RADIOACTIVE SEED AND SENTINEL LYMPH NODE BIOPSY;  Surgeon: Oza Blumenthal, MD;  Location: [facility];  Service: General;  Laterality: Right;  LMA PEC BLOCK   EXCISION OF KELOID N/A 05/21/2023   Procedure: EXCISION CHEST WALL SCAR;  Surgeon: Oza Blumenthal, MD;  Location: [facility];  Service: General;  Laterality: N/A;   PORTACATH PLACEMENT N/A 10/23/2022   Procedure: INSERTION PORT-A-CATH;  Surgeon: Oza Blumenthal, MD;  Location: [facility];  Service: General;  Laterality: N/A;    SOCIAL HISTORY: Social History   Socioeconomic History   Marital status: Single    Spouse name: Not on file   Number of children: Not on file   Years of education: Not on file   Highest education level: 12th grade  Occupational History   Not on file  Tobacco Use   Smoking status: Never   Smokeless tobacco: Never  Vaping Use   Vaping status: Never Used  Substance and Sexual Activity   Alcohol use: Not Currently   Drug use: No   Sexual activity: Not Currently    Partners: Male  Birth control/protection: Post-menopausal, Abstinence  Other Topics Concern   Not on file  Social History Narrative   Not on file   Social Drivers of Health   Financial Resource Strain: High Risk (12/18/2023)   Overall Financial Resource Strain (CARDIA)    Difficulty of Paying Living Expenses: Hard  Food Insecurity: Food Insecurity Present (12/18/2023)   Hunger Vital Sign    Worried About Running Out of Food in the Last Year: Sometimes true    Ran Out of Food in the Last Year: Sometimes true  Transportation Needs: Unmet Transportation Needs (12/18/2023)   PRAPARE - Administrator, Civil Service (Medical): Yes    Lack of Transportation (Non-Medical): No  Physical Activity: Unknown (11/27/2023)   Exercise Vital Sign    Days of Exercise per Week: 0 days    Minutes of Exercise per Session: Not on file   Stress: Stress Concern Present (11/27/2023)   [employer] of Occupational Health - Occupational Stress Questionnaire    Feeling of Stress : Rather much  Social Connections: Socially Isolated (11/27/2023)   Social Connection and Isolation Panel [NHANES]    Frequency of Communication with Friends and Family: Once a week    Frequency of Social Gatherings with Friends and Family: Never    Attends Religious Services: Never    Database administrator or Organizations: No    Attends Engineer, structural: Not on file    Marital Status: Never married  Catering manager Violence: Not on file    FAMILY HISTORY: Family History  Problem Relation Age of Onset   Multiple myeloma Father    Breast cancer Sister 88 - 63   Cancer Paternal Grandmother        unknown type    Review of Systems  Constitutional:  Negative for appetite change, chills, fatigue, fever and unexpected weight change.  HENT:   Negative for hearing loss, lump/mass and trouble swallowing.   Eyes:  Negative for eye problems and icterus.  Respiratory:  Negative for chest tightness, cough and shortness of breath.   Cardiovascular:  Negative for chest pain, leg swelling and palpitations.  Gastrointestinal:  Negative for abdominal distention, abdominal pain, constipation, diarrhea, nausea and vomiting.  Endocrine: Negative for hot flashes.  Genitourinary:  Negative for difficulty urinating.   Musculoskeletal:  Negative for arthralgias.  Skin:  Negative for itching and rash.  Neurological:  Negative for dizziness, extremity weakness, headaches and numbness.  Hematological:  Negative for adenopathy. Does not bruise/bleed easily.  Psychiatric/Behavioral:  Negative for depression. The patient is not nervous/anxious.       PHYSICAL EXAMINATION    Vitals:   02/08/24 1247  BP: 118/66  Pulse: 71  Resp: 16  Temp: 98.5 F (36.9 C)  SpO2: 100%    Both breasts examined. No palpable masses or regional  adenopathy Skin bump left axilla is irritated hair follicle. No other concerns on exam   LABORATORY DATA:  CBC    Component Value Date/Time   WBC 2.0 (L) 02/08/2024 1231   WBC 2.3 (L) 10/22/2023 2122   RBC 3.79 (L) 02/08/2024 1231   HGB 11.0 (L) 02/08/2024 1231   HCT 33.0 (L) 02/08/2024 1231   PLT 138 (L) 02/08/2024 1231   MCV 87.1 02/08/2024 1231   MCH 29.0 02/08/2024 1231   MCHC 33.3 02/08/2024 1231   RDW 12.6 02/08/2024 1231   LYMPHSABS 0.5 (L) 02/08/2024 1231   MONOABS 0.2 02/08/2024 1231   EOSABS 0.0 02/08/2024 1231   BASOSABS  0.0 02/08/2024 1231    CMP     Component Value Date/Time   NA 139 02/08/2024 1231   K 3.7 02/08/2024 1231   CL 104 02/08/2024 1231   CO2 31 02/08/2024 1231   GLUCOSE 111 (H) 02/08/2024 1231   BUN 13 02/08/2024 1231   CREATININE 0.80 02/08/2024 1231   CALCIUM 9.5 02/08/2024 1231   PROT 7.4 02/08/2024 1231   ALBUMIN 4.3 02/08/2024 1231   AST 19 02/08/2024 1231   ALT 13 02/08/2024 1231   ALKPHOS 79 02/08/2024 1231   BILITOT 0.4 02/08/2024 1231   GFRNONAA >60 02/08/2024 1231   GFRAA >60 12/23/2015 1151        ASSESSMENT and THERAPY PLAN:   Malignant neoplasm of upper-outer quadrant of right female breast (HCC)  Assessment and Plan Assessment & Plan Breast cancer Weak estrogen receptor staining. PET scan shows no active cancer. Right breast skin thickening and right hemithorax opacity likely due to radiation. Lymph nodes show no cancerous activity. Open to anti-estrogen therapy with low threshold for discontinuation if side effects occur. - Guardant reveal neg in March 2025 - Schedule port flush every 6-8 weeks. - She is reluctant to have her port removed at this time - Once again, we talked about side effects of tamoxifen  including but not limited to ho hot flashes, vaginal discharge, small risk of DVT/PE, endometrial hyperplasia and endometrial carcinoma. Pt agrees to try it. Dispensed to pharmacy of choice.  Lymphedema Right  breast swelling and heaviness due to lymphedema, with discomfort and pain. Inconsistent compression bra use may exacerbate symptoms. - Refer to physical therapy for lymphedema management. - Advise daily use of compression bra. - Instruct to contact for new prescription for compression bras if needed.  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 Reassured count sufficient for infection control.  Follow-up Regular follow-up appointments and port flushes to continue. She plans to travel and will decide on port removal later. - Schedule port flush every six weeks. - Plan follow-up appointment in September after next blood test. - Discuss port removal when she is ready.     All questions were answered. The patient knows to call the clinic with any problems, questions or concerns. We can certainly see the patient much sooner if necessary.

## 2024-02-09 ENCOUNTER — Telehealth: Payer: Self-pay | Admitting: Gastroenterology

## 2024-02-09 ENCOUNTER — Telehealth: Payer: Self-pay | Admitting: Family Medicine

## 2024-02-09 NOTE — Telephone Encounter (Signed)
 PT is scheduled for a colonoscopy on tomorrow at 830am. She went to her PCP on yesterday and her TS4 levels are high. She wants to know will this affect her having the procedure. Please advise.

## 2024-02-09 NOTE — Telephone Encounter (Signed)
 PT has postponed colonoscopy for tomorrow due to her Thyroid  levels being high. She will reschedule once she is feeling better.

## 2024-02-09 NOTE — Telephone Encounter (Signed)
 Copied from CRM [id number]. Topic: Clinical - Medical Advice >> Feb 09, 2024  9:54 AM Adonis Hoot wrote: Reason for CRM: Patient is scheduled for colonoscopy on tomorrow.She had labs done on yesterday that showed her thyroid  levels were high ,hypothyroidism.She would like to know is it still safe to have the colonoscopy done? Please advised

## 2024-02-09 NOTE — Telephone Encounter (Signed)
 Please see the above note. Thank you

## 2024-02-09 NOTE — Telephone Encounter (Signed)
 Phoned patient and left voicemail with Dr.Mansouraty's recommendations and encouraged patient to call us  back if she has additional questions or concerns.

## 2024-02-09 NOTE — Telephone Encounter (Signed)
 She has a history of hypothyroidism. She has had TSH elevations in the past. From a standpoint of performing the colonoscopy, she may move forward with this as scheduled. Should she have concerns, then I will not be upset if she wants to reschedule, but again this is not a new diagnosis for her. Thanks. GM

## 2024-02-10 ENCOUNTER — Ambulatory Visit: Payer: Self-pay | Admitting: Hematology and Oncology

## 2024-02-10 ENCOUNTER — Encounter: Admitting: Gastroenterology

## 2024-02-11 NOTE — Telephone Encounter (Signed)
 Will thyroid  levels effect colonoscopy? Pt has postponed her procedure due to labs

## 2024-02-12 NOTE — Telephone Encounter (Signed)
 LM for pt advising she needs to discuss w GI dr to determine if it is ok to move forward w colonoscopy

## 2024-02-15 ENCOUNTER — Encounter: Payer: Self-pay | Admitting: Gastroenterology

## 2024-02-16 ENCOUNTER — Ambulatory Visit (INDEPENDENT_AMBULATORY_CARE_PROVIDER_SITE_OTHER)

## 2024-02-16 ENCOUNTER — Telehealth: Payer: Self-pay

## 2024-02-16 ENCOUNTER — Encounter: Payer: Self-pay | Admitting: Obstetrics and Gynecology

## 2024-02-16 ENCOUNTER — Ambulatory Visit (INDEPENDENT_AMBULATORY_CARE_PROVIDER_SITE_OTHER): Admitting: Obstetrics and Gynecology

## 2024-02-16 VITALS — BP 118/78 | HR 69

## 2024-02-16 DIAGNOSIS — R102 Pelvic and perineal pain: Secondary | ICD-10-CM | POA: Diagnosis not present

## 2024-02-16 DIAGNOSIS — Z01419 Encounter for gynecological examination (general) (routine) without abnormal findings: Secondary | ICD-10-CM | POA: Diagnosis not present

## 2024-02-16 DIAGNOSIS — E2839 Other primary ovarian failure: Secondary | ICD-10-CM | POA: Diagnosis not present

## 2024-02-16 DIAGNOSIS — N840 Polyp of corpus uteri: Secondary | ICD-10-CM | POA: Diagnosis not present

## 2024-02-16 DIAGNOSIS — Z712 Person consulting for explanation of examination or test findings: Secondary | ICD-10-CM

## 2024-02-16 DIAGNOSIS — D500 Iron deficiency anemia secondary to blood loss (chronic): Secondary | ICD-10-CM

## 2024-02-16 NOTE — Therapy (Signed)
 OUTPATIENT PHYSICAL THERAPY  UPPER EXTREMITY ONCOLOGY EVALUATION  Patient Name: Maria Carter MRN: [id number] DOB:02-06-1971, 53 y.o., female Today's Date: 02/17/2024  END OF SESSION:  PT End of Session - 02/17/24 1055     Visit Number 1    Number of Visits 6    Date for PT Re-Evaluation 03/30/24    Authorization Type [employer]    PT Start Time 1002    PT Stop Time 1046    PT Time Calculation (min) 44 min    Activity Tolerance Patient tolerated treatment well    Behavior During Therapy WFL for tasks assessed/performed          Past Medical History:  Diagnosis Date   Anxiety    Breast cancer (HCC)    right breast IDC   Depression    Hx of febrile seizure    1980   Hypothyroidism    Osteopenia    2025 -1.8 frax neg. repeat in 2 years   Past Surgical History:  Procedure Laterality Date   BREAST BIOPSY Right 09/30/2022   US  RT BREAST BX W LOC DEV 1ST LESION IMG BX SPEC US  GUIDE 09/30/2022 [facility]   BREAST BIOPSY  05/20/2023   MM RT RADIOACTIVE SEED LOC MAMMO GUIDE 05/20/2023 [facility]   BREAST LUMPECTOMY Right 05/21/2023   BREAST LUMPECTOMY WITH RADIOACTIVE SEED AND SENTINEL LYMPH NODE BIOPSY Right 05/21/2023   Procedure: RIGHT BREAST LUMPECTOMY WITH RADIOACTIVE SEED AND SENTINEL LYMPH NODE BIOPSY;  Surgeon: Oza Blumenthal, MD;  Location: [facility];  Service: General;  Laterality: Right;  LMA PEC BLOCK   EXCISION OF KELOID N/A 05/21/2023   Procedure: EXCISION CHEST WALL SCAR;  Surgeon: Oza Blumenthal, MD;  Location: [facility];  Service: General;  Laterality: N/A;   PORTACATH PLACEMENT N/A 10/23/2022   Procedure: INSERTION PORT-A-CATH;  Surgeon: Oza Blumenthal, MD;  Location: [facility];  Service: General;  Laterality: N/A;   Patient Active Problem List   Diagnosis Date Noted   Port-A-Cath in place 10/24/2022   Genetic testing 10/16/2022   Malignant neoplasm of upper-outer quadrant of right  female breast (HCC) 10/06/2022   Thrombocytopenia (HCC) 03/05/2022   Leukopenia 03/05/2022   Hot flashes 02/28/2022   Hair thinning 02/28/2022   Amenorrhea 02/28/2022   Screening for colon cancer 02/28/2022   Multiple nevi 02/28/2022   PTSD (post-traumatic stress disorder) 02/08/2021   Mild episode of recurrent major depressive disorder (HCC) 09/12/2013   Attention deficit disorder 04/25/2013   Migraine 02/05/2013   KELOID SCAR 05/09/2010   URINALYSIS, ABNORMAL 05/09/2010   Anxiety state 03/01/2010   DEPRESSION 03/01/2010   Headache 03/01/2010   Intermittent chest pain 03/01/2010    PCP:   REFERRING PROVIDER: Bernis Brisker Iruku,MD  REFERRING DIAG: Right Breast Lymphedema  THERAPY DIAG:  Malignant neoplasm of upper-outer quadrant of right breast in female, estrogen receptor positive (HCC)  Abnormal posture  Lymphedema, not elsewhere classified  ONSET DATE: May 2025  Rationale for Evaluation and Treatment: Rehabilitation  SUBJECTIVE:  SUBJECTIVE STATEMENT:  Pt with complaints of right breast swelling and pain. She is getting some shooting pains in her breast intermittently..  My right breast feels heavy, and it is tender. Wearing my compression bra helps sometimes, but I don't wear it when I go out.  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 It measures 3 cm and is located in the upper outer quadrant. It is weakly ER positive, PR and HER2 negative with a Ki67 of 70%.  She is s/p Neoadjuvant chemotherapy, followed by Right Lumpectomy with SLNB on 05/21/2023 and radiation from 07/06/2023-08/04/2023. She is on permanent disability for mental health reasons.   PAIN:  Are you having pain? Yes NPRS scale: 7/10 Pain  location: Right breast Pain orientation: Right  PAIN TYPE: sharp Pain description: intermittent  Aggravating factors: none Relieving factors: compression bra, propping breast.  PRECAUTIONS: Right UE lymphedema risk  RED FLAGS: None   WEIGHT BEARING RESTRICTIONS: No  FALLS:  Has patient fallen in last 6 months? No  LIVING ENVIRONMENT: Lives with: lives with their family, 2 sons, 90 and 54    OCCUPATION: On disability  LEISURE: fish, Has 2 bullies and she spends a lot of time at the park, and walking.  HAND DOMINANCE: right   PRIOR LEVEL OF FUNCTION: Independent  PATIENT GOALS: Decrease breast swelling/pain, improve ROM   OBJECTIVE: Note: Objective measures were completed at Evaluation unless otherwise noted.  COGNITION: Overall cognitive status: Within functional limits for tasks assessed   PALPATION: Tender around incision site  OBSERVATIONS / OTHER ASSESSMENTS: Peau' d'orange Right lateral inferior breast with fibrosis, large lateral breast incision healed with mild fibrosis, hyperpigmentation, mildly enlarged pores mostly laterally,  SENSATION: Light touch: deficits in axilla    POSTURE: forward Head rounded shoulders  UPPER EXTREMITY AROM/PROM:  A/PROM RIGHT   eval   Shoulder extension   Shoulder flexion 147  Shoulder abduction 152  Shoulder internal rotation   Shoulder external rotation     (Blank rows = not tested)  A/PROM LEFT   eval  Shoulder extension   Shoulder flexion 160  Shoulder abduction 167  Shoulder internal rotation   Shoulder external rotation     (Blank rows = not tested)  CERVICAL AROM: All within normal limits:    UPPER EXTREMITY STRENGTH:   LYMPHEDEMA ASSESSMENTS:   SURGERY TYPE/DATE: Right Lumpectomy with SLNB 05/21/2023   NUMBER OF LYMPH NODES REMOVED: 0/8  CHEMOTHERAPY: Neoadjuvant  RADIATION:Yes, ended 08/04/2023  HORMONE TREATMENT: Tamoxifen   INFECTIONS: No   LYMPHEDEMA ASSESSMENTS:   LANDMARK  RIGHT  eval  At axilla    15 cm proximal to olecranon process   10 cm proximal to olecranon process   Olecranon process   15 cm proximal to ulnar styloid process   10 cm proximal to ulnar styloid process   Just proximal to ulnar styloid process   Across hand at thumb web space   At base of 2nd digit   (Blank rows = not tested)  LANDMARK LEFT  eval  At axilla    15 cm proximal to olecranon process   10 cm proximal to olecranon process   Olecranon process   15 cm proximal to ulnar styloid process   10 cm proximal to ulnar styloid process   Just proximal to ulnar styloid process   Across hand at thumb web space   At base of 2nd digit   (Blank rows = not tested)  FUNCTIONAL TESTS:    GAIT: WNL  L-DEX LYMPHEDEMA SCREENING: The patient was assessed using the L-Dex machine today to produce a lymphedema index baseline score. The patient will be reassessed on a regular basis (typically every 3 months) to obtain new L-Dex scores. If the score is > 6.5 points away from his/her baseline score indicating onset of subclinical lymphedema, it will be recommended to wear a compression garment for 4 weeks, 12 hours per day and then be reassessed. If the score continues to be > 6.5 points from baseline at reassessment, we will initiate lymphedema treatment. Assessing in this manner has a 95% rate of preventing clinically significant lymphedema.  L-DEX FLOWSHEETS - 02/17/24 1000       L-DEX LYMPHEDEMA SCREENING   Measurement Type Unilateral    L-DEX MEASUREMENT EXTREMITY Upper Extremity    POSITION  Standing    DOMINANT SIDE Right    At Risk Side Right    BASELINE SCORE (UNILATERAL) -0.7    L-DEX SCORE (UNILATERAL) 0    VALUE CHANGE (UNILAT) 0.7          QUICK DASH SURVEY:   BREAST COMPLAINTS QUESTIONNAIRE Pain:7 Heaviness:10 Swollen feeling:10 Tense Skin:10 Redness:0 Bra Print:0 Size of Pores:0 Hard feeling: 10 Total: 47    /80 A Score over 9 indicates lymphedema issues  in the breast                                                                                                                             TREATMENT DATE:  02/17/2024 Pt was educated in LOS, POC and treatment interventions. Reviewed and had pt perform all AAROM exercises to improve her right shoulder ROM x 4-5 repetitions. Discussed MLD that we will start with for her breast swelling and that we will instruct her also. Explained importance of wearing her compression bra daily to decrease swelling and heaviness.   PATIENT EDUCATION:  Education details: see treatment 02/17/2024 Person educated: Patient Education method: Explanation, Demonstration, and Handouts Education comprehension: verbalized understanding and returned demonstration  HOME EXERCISE PROGRAM: 4 post op exercises  ASSESSMENT:  CLINICAL IMPRESSION: Patient is a 53 y.o. female who was seen today for physical therapy evaluation and treatment for complaints of right breast swelling and pain.. She is s/p Right Lumpectomy with SLNB 05/21/2023 with 0+/8 LN's removed. She developed swelling and heaviness in the breast about a month ago. She is noted to have Peau d'orange  at the Right lateral inferior breast with fibrosis, large lateral breast incision healed with mild fibrosis, hyperpigmentation,and  mildly enlarged pores mostly laterally. She is also observed with limited right shoulder ROM. She will benefit from skilled PT to address deficits and return to PLOF     OBJECTIVE IMPAIRMENTS: decreased activity tolerance, decreased knowledge of condition, decreased ROM, increased edema, postural dysfunction, and pain.   ACTIVITY LIMITATIONS: sleeping and reach over head  PARTICIPATION LIMITATIONS: cleaning and activities requiring extremes of shoulder ROM  PERSONAL FACTORS: 3+  comorbidities: Breast surgery, chemotherapy and radiation are also affecting patient's functional outcome.   REHAB POTENTIAL: Good  CLINICAL DECISION  MAKING: Stable/uncomplicated  EVALUATION COMPLEXITY: Low  GOALS: Goals reviewed with patient? Yes  SHORT TERM GOALS=LONG TERM GOALS: Target date: 03/30/2024  Pt will be compliant with daily compression bra wear to decrease Right breast swelling and pain. Baseline: Goal status: INITIAL  2.  Pt will be independent in MLD to the right breast to decrease swelling. Baseline:  Goal status: INITIAL  3.  Pts. Breast Complaints questionnaire will be  decreased to no greater  than 20 to demonstrate improvements in swelling Baseline:  Goal status: INITIAL  4.  Pt will report 50% improvement in right breast/chest pain Baseline:  Goal status: INITIAL  5.  Pt will have right shoulder flexion and abduction improved by 10 degrees for improved reaching Baseline: 147, 152 Goal status: INITIAL   PLAN:  PT FREQUENCY: 1x/week per pt request due to cost, schedule  PT DURATION: 6 weeks  PLANNED INTERVENTIONS: 97164- PT Re-evaluation, 97110-Therapeutic exercises, 97530- Therapeutic activity, 97112- Neuromuscular re-education, 97535- Self Care, 40981- Manual therapy, 97760- Orthotic Initial, and Patient/Family education  PLAN FOR NEXT SESSION: Add supine wand flex and scaption, review ROM prn, initiate MLD to right breast and start instructing pt. Can only come 1x per week  Latisha Poland, PT 02/17/2024, 2:21 PM

## 2024-02-16 NOTE — Progress Notes (Signed)
   Acute Office Visit  Subjective:    Patient ID: Maria Carter, female    DOB: Jun 18, 1971, 53 y.o.   MRN: [id number]   HPI 53 y.o. presents today for ultrasound & consult (Ultrasound & consult//jj) .  Patient with cramping but not heavy bleeding Has GI colonoscopy scheduled in August and does have some constipation Reports thyroid  medication has been adjusted recently as well  PUS with  9.13cm uterus 12.47 mm thickened with vascularity seen ?polyp Fibroid 1.10 cm  Both ovaries are normal No free fluid  No LMP recorded (lmp unknown). Patient is postmenopausal.    Review of Systems     Objective:    OBGyn Exam  BP 118/78   Pulse 69   LMP  (LMP Unknown)   SpO2 99%  Wt Readings from Last 3 Encounters:  02/08/24 164 lb 11.2 oz (74.7 kg)  01/20/24 161 lb (73 kg)  01/19/24 164 lb (74.4 kg)        Patient informed chaperone available to be present for breast and/or pelvic exam. Patient has requested no chaperone to be present. Patient has been advised what will be completed during breast and pelvic exam.   Assessment & Plan:  Breast cancer Abdominal cramping Endometrial polyp on PUS today  US  with possible polyp. Discussed polyps can cause bleeding and have associated risk for cancer <1% risk. Discussed she may have cramping from stenosed cervix and entrapped blood caused by the polyp, but GI causes need to be excluded as well.  Discussed best way to remove these is with the hysteroscopy myosure D&C. This is done with anesthesia in the operating room. The procedure was reviewed in detail and what to expect. She called her boyfriend [name] and he was apart of the conversation and was allowed to ask questions as well. Discussed polyps can return in the future as well and the s/s. Patient agreed to surgery and a surgery request was sent.  30 minutes spent on reviewing records, imaging,  and one on one patient time and counseling patient and documentation Dr.  Caro Christmas

## 2024-02-16 NOTE — Telephone Encounter (Signed)
 Patient called and left a VM stating she had a transvaginal US  done today which showed uterine polyps. Patient's GYN plans to remove polyps, but patient wanted to know whether or not she can can continue taking her tamoxifen  prescription in the mean time.   Relayed patient's concerns to Dr. Arno Bibles who stated that it's alright for patient to continue taking tamoxifen , but that there may be possibility the medication will be stopped depending on what the pathology shows from the polyps removed.   Returned patient's call and informed her of Dr. Lorella Roles response. Reminded her of her Survivorship appointment this Thursday 6/19, and assured her that Lindsey-NP would review matter with patient again during visit. Patient verbalized understanding and appreciation of call.

## 2024-02-17 ENCOUNTER — Other Ambulatory Visit: Payer: Self-pay

## 2024-02-17 ENCOUNTER — Ambulatory Visit: Attending: Hematology and Oncology

## 2024-02-17 DIAGNOSIS — Z170 Estrogen receptor positive status [ER+]: Secondary | ICD-10-CM | POA: Diagnosis not present

## 2024-02-17 DIAGNOSIS — C50411 Malignant neoplasm of upper-outer quadrant of right female breast: Secondary | ICD-10-CM | POA: Insufficient documentation

## 2024-02-17 DIAGNOSIS — I890 Lymphedema, not elsewhere classified: Secondary | ICD-10-CM | POA: Insufficient documentation

## 2024-02-17 DIAGNOSIS — R293 Abnormal posture: Secondary | ICD-10-CM | POA: Insufficient documentation

## 2024-02-17 DIAGNOSIS — M25611 Stiffness of right shoulder, not elsewhere classified: Secondary | ICD-10-CM | POA: Insufficient documentation

## 2024-02-18 ENCOUNTER — Inpatient Hospital Stay (HOSPITAL_BASED_OUTPATIENT_CLINIC_OR_DEPARTMENT_OTHER): Admitting: Adult Health

## 2024-02-18 ENCOUNTER — Ambulatory Visit

## 2024-02-18 ENCOUNTER — Inpatient Hospital Stay

## 2024-02-18 ENCOUNTER — Encounter: Payer: Self-pay | Admitting: Adult Health

## 2024-02-18 VITALS — BP 100/68 | HR 73 | Temp 97.3°F | Resp 18 | Ht 62.25 in | Wt 166.2 lb

## 2024-02-18 DIAGNOSIS — C50411 Malignant neoplasm of upper-outer quadrant of right female breast: Secondary | ICD-10-CM | POA: Diagnosis not present

## 2024-02-18 DIAGNOSIS — G620 Drug-induced polyneuropathy: Secondary | ICD-10-CM

## 2024-02-18 DIAGNOSIS — Z79810 Long term (current) use of selective estrogen receptor modulators (SERMs): Secondary | ICD-10-CM | POA: Diagnosis not present

## 2024-02-18 DIAGNOSIS — Z170 Estrogen receptor positive status [ER+]: Secondary | ICD-10-CM | POA: Diagnosis not present

## 2024-02-18 DIAGNOSIS — Z79899 Other long term (current) drug therapy: Secondary | ICD-10-CM | POA: Diagnosis not present

## 2024-02-18 DIAGNOSIS — T451X5A Adverse effect of antineoplastic and immunosuppressive drugs, initial encounter: Secondary | ICD-10-CM

## 2024-02-18 DIAGNOSIS — E559 Vitamin D deficiency, unspecified: Secondary | ICD-10-CM

## 2024-02-18 DIAGNOSIS — Z95828 Presence of other vascular implants and grafts: Secondary | ICD-10-CM

## 2024-02-18 DIAGNOSIS — Z452 Encounter for adjustment and management of vascular access device: Secondary | ICD-10-CM | POA: Diagnosis not present

## 2024-02-18 DIAGNOSIS — Z1732 Human epidermal growth factor receptor 2 negative status: Secondary | ICD-10-CM | POA: Diagnosis not present

## 2024-02-18 DIAGNOSIS — Z923 Personal history of irradiation: Secondary | ICD-10-CM | POA: Diagnosis not present

## 2024-02-18 DIAGNOSIS — Z1722 Progesterone receptor negative status: Secondary | ICD-10-CM | POA: Diagnosis not present

## 2024-02-18 DIAGNOSIS — Z9221 Personal history of antineoplastic chemotherapy: Secondary | ICD-10-CM | POA: Diagnosis not present

## 2024-02-18 LAB — CBC WITH DIFFERENTIAL (CANCER CENTER ONLY)
Abs Immature Granulocytes: 0.01 10*3/uL (ref 0.00–0.07)
Basophils Absolute: 0 10*3/uL (ref 0.0–0.1)
Basophils Relative: 0 %
Eosinophils Absolute: 0 10*3/uL (ref 0.0–0.5)
Eosinophils Relative: 1 %
HCT: 33 % — ABNORMAL LOW (ref 36.0–46.0)
Hemoglobin: 11.1 g/dL — ABNORMAL LOW (ref 12.0–15.0)
Immature Granulocytes: 0 %
Lymphocytes Relative: 27 %
Lymphs Abs: 0.7 10*3/uL (ref 0.7–4.0)
MCH: 29.5 pg (ref 26.0–34.0)
MCHC: 33.6 g/dL (ref 30.0–36.0)
MCV: 87.8 fL (ref 80.0–100.0)
Monocytes Absolute: 0.3 10*3/uL (ref 0.1–1.0)
Monocytes Relative: 12 %
Neutro Abs: 1.5 10*3/uL — ABNORMAL LOW (ref 1.7–7.7)
Neutrophils Relative %: 60 %
Platelet Count: 148 10*3/uL — ABNORMAL LOW (ref 150–400)
RBC: 3.76 MIL/uL — ABNORMAL LOW (ref 3.87–5.11)
RDW: 12.8 % (ref 11.5–15.5)
WBC Count: 2.5 10*3/uL — ABNORMAL LOW (ref 4.0–10.5)
nRBC: 0 % (ref 0.0–0.2)

## 2024-02-18 LAB — CMP (CANCER CENTER ONLY)
ALT: 10 U/L (ref 0–44)
AST: 16 U/L (ref 15–41)
Albumin: 4.3 g/dL (ref 3.5–5.0)
Alkaline Phosphatase: 87 U/L (ref 38–126)
Anion gap: 5 (ref 5–15)
BUN: 15 mg/dL (ref 6–20)
CO2: 30 mmol/L (ref 22–32)
Calcium: 9.6 mg/dL (ref 8.9–10.3)
Chloride: 104 mmol/L (ref 98–111)
Creatinine: 0.76 mg/dL (ref 0.44–1.00)
GFR, Estimated: >60 mL/min (ref >60)
Glucose, Bld: 90 mg/dL (ref 70–99)
Potassium: 4.2 mmol/L (ref 3.5–5.1)
Sodium: 139 mmol/L (ref 135–145)
Total Bilirubin: 0.3 mg/dL (ref 0.0–1.2)
Total Protein: 7.6 g/dL (ref 6.5–8.1)

## 2024-02-18 LAB — VITAMIN B12: Vitamin B-12: 556 pg/mL (ref 180–914)

## 2024-02-18 LAB — VITAMIN D 25 HYDROXY (VIT D DEFICIENCY, FRACTURES): Vit D, 25-Hydroxy: 14.53 ng/mL — ABNORMAL LOW (ref 30–100)

## 2024-02-18 MED ORDER — HEPARIN SOD (PORK) LOCK FLUSH 100 UNIT/ML IV SOLN: 500.0000 [IU] | Freq: Once | INTRAVENOUS | Status: AC

## 2024-02-18 MED ORDER — SODIUM CHLORIDE 0.9% FLUSH
10.0000 mL | Freq: Once | INTRAVENOUS | Status: AC
  Administered 2024-02-18: 10 mL

## 2024-02-18 NOTE — Progress Notes (Unsigned)
 SURVIVORSHIP VISIT:  BRIEF ONCOLOGIC HISTORY:  Oncology History  Malignant neoplasm of upper-outer quadrant of right female breast (HCC)  09/26/2022 Mammogram   Patient had a palpable right breast mass and hence underwent bilateral diagnostic mammogram.  This confirmed a suspicious 3 cm right upper outer quadrant mass and an abnormal right axillary lymph node with cortical thickening.  Tissue sampling of both the breast mass and axillary lymph node were recommended.  Left breast was normal.   09/26/2022 Breast US    Ultrasound breast confirmed the above-mentioned findings.   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  Prognostic showed ER 50% positive weak staining PR 0% negative HER2 1+ by IHC and Ki-67 of 70%   10/08/2022 Cancer Staging   Staging form: Breast, AJCC 8th Edition - Clinical: Stage IIB (cT2, cN0, cM0, G3, ER+, PR-, HER2-) - Signed by [name], MD on 10/08/2022 Stage prefix: Initial diagnosis Histologic grading system: 3 grade system   10/24/2022 - 11/14/2022 Chemotherapy   Patient is on Treatment Plan : BREAST Pembrolizumab  (200) D1 + Carboplatin  (5) D1 + Paclitaxel  (80) D1,8,15 q21d X 4 cycles / Pembrolizumab  (200) D1 + AC D1 q21d x 4 cycles      Genetic Testing   [facility] Common Cancer Panel+RNA was Negative. Report date is 10/16/2022.  The Common Hereditary Cancers Panel offered by [facility] includes sequencing and/or deletion duplication testing of the following 48 genes: APC, ATM, AXIN2, BAP1, BARD1, BMPR1A, BRCA1, BRCA2, BRIP1, CDH1, CDK4, CDKN2A (p14ARF and p16INK4a only), CHEK2, CTNNA1, DICER1, EPCAM (Deletion/duplication testing only), FH, GREM1 (promoter region duplication testing only), HOXB13, KIT, MBD4, MEN1, MLH1, MSH2, MSH3, MSH6, MUTYH, NF1, NHTL1, PALB2, PDGFRA, PMS2, POLD1, POLE, PTEN, RAD51C, RAD51D, SDHA (sequencing analysis only except exon 14), SDHB, SDHC, SDHD, SMAD4, SMARCA4. STK11, TP53, TSC1,  TSC2, and VHL.   11/21/2022 - 04/08/2023 Chemotherapy   Patient is on Treatment Plan : BREAST Pembrolizumab  (200) D1 + Carboplatin  (1.5) D1,8,15 + Paclitaxel  (80) D1,8,15 q21d X 4 cycles / Pembrolizumab  (200) D1 + AC D1 q21d x 4 cycles     05/19/2023 - 09/28/2023 Chemotherapy   Patient is on Treatment Plan : BREAST Pembrolizumab  (200) q21d x 27 weeks     05/21/2023 Surgery   Right lumpectomy: metaplastic IDC with chondroid differentiation, 1.8 cm, grade 3, margins negative, no LVI, 8 SLN negative, repeat prognostic panel: ER 40% positive, weak staining, PR 0% negative, Ki-67 80%, HER2 negative (0). RCB-II   07/06/2023 - 08/04/2023 Radiation Therapy   Plan Name: Breast_R Site: Breast, Right Technique: 3D Mode: Photon Dose Per Fraction: 2.66 Gy Prescribed Dose (Delivered / Prescribed): 42.56 Gy / 42.56 Gy Prescribed Fxs (Delivered / Prescribed): 16 / 16   Plan Name: Breast_R_Bst Site: Breast, Right Technique: 3D Mode: Photon Dose Per Fraction: 2 Gy Prescribed Dose (Delivered / Prescribed): 8 Gy / 8 Gy Prescribed Fxs (Delivered / Prescribed): 4 / 4   12/2023 -  Anti-estrogen oral therapy   Tamoxifen      INTERVAL HISTORY:  Maria Carter to review her survivorship care plan detailing her treatment course for breast cancer, as well as monitoring long-term side effects of that treatment, education regarding health maintenance, screening, and overall wellness and health promotion.     Overall, Maria Carter reports feeling quite well.  She is taking tamoxifen  daily.  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    REVIEW OF SYSTEMS:  Review of Systems  Constitutional:  Positive for fatigue. Negative  for appetite change, chills, fever and unexpected weight change.  HENT:   Negative for hearing loss, lump/mass and trouble swallowing.   Eyes:  Negative for eye problems and icterus.  Respiratory:  Negative for chest tightness, cough and shortness of breath.    Cardiovascular:  Negative for chest pain, leg swelling and palpitations.  Gastrointestinal:  Negative for abdominal distention, abdominal pain, constipation, diarrhea, nausea and vomiting.  Endocrine: Negative for hot flashes.  Genitourinary:  Negative for difficulty urinating.   Musculoskeletal:  Negative for arthralgias.  Skin:  Negative for itching and rash.  Neurological:  Negative for dizziness, extremity weakness and headaches.  Hematological:  Negative for adenopathy. Does not bruise/bleed easily.  Psychiatric/Behavioral:  Negative for depression. The patient is not nervous/anxious.    Breast: Denies any new nodularity, masses, tenderness, nipple changes, or nipple discharge.       PAST MEDICAL/SURGICAL HISTORY:  Past Medical History:  Diagnosis Date   Anxiety    Breast cancer (HCC)    right breast IDC   Depression    Hx of febrile seizure    1980   Hypothyroidism    Osteopenia    2025 -1.8 frax neg. repeat in 2 years   Past Surgical History:  Procedure Laterality Date   BREAST BIOPSY Right 09/30/2022   US  RT BREAST BX W LOC DEV 1ST LESION IMG BX SPEC US  GUIDE 09/30/2022 [facility]   BREAST BIOPSY  05/20/2023   MM RT RADIOACTIVE SEED LOC MAMMO GUIDE 05/20/2023 [facility]   BREAST LUMPECTOMY Right 05/21/2023   BREAST LUMPECTOMY WITH RADIOACTIVE SEED AND SENTINEL LYMPH NODE BIOPSY Right 05/21/2023   Procedure: RIGHT BREAST LUMPECTOMY WITH RADIOACTIVE SEED AND SENTINEL LYMPH NODE BIOPSY;  Surgeon: [name], MD;  Location: [facility];  Service: General;  Laterality: Right;  LMA PEC BLOCK   EXCISION OF KELOID N/A 05/21/2023   Procedure: EXCISION CHEST WALL SCAR;  Surgeon: [name], MD;  Location: [facility];  Service: General;  Laterality: N/A;   PORTACATH PLACEMENT N/A 10/23/2022   Procedure: INSERTION PORT-A-CATH;  Surgeon: [name], MD;  Location: [facility];  Service: General;   Laterality: N/A;     ALLERGIES:  No Known Allergies   CURRENT MEDICATIONS:  Outpatient Encounter Medications as of 02/18/2024  Medication Sig   ALPRAZolam (XANAX) 0.25 MG tablet Take 1 tab  daily  prn anxiety (Patient taking differently: When needed.)   busPIRone (BUSPAR) 15 MG tablet Take by mouth.   desvenlafaxine (PRISTIQ) 50 MG 24 hr tablet Take 50 mg by mouth daily.   levothyroxine  (SYNTHROID ) 25 MCG tablet Take 25 mcg by mouth daily before breakfast. Take 2 tablets once daily   Multiple Vitamins-Minerals (MULTIVITAMIN ADULTS PO) Take 1 tablet by mouth daily.   tamoxifen  (NOLVADEX ) 20 MG tablet Take 1 tablet (20 mg total) by mouth daily.   No facility-administered encounter medications on file as of 02/18/2024.     ONCOLOGIC FAMILY HISTORY:  Family History  Problem Relation Age of Onset   Multiple myeloma Father    Breast cancer Sister 59 - 28   Cancer Paternal Grandmother        unknown type     SOCIAL HISTORY:  Social History   Socioeconomic History   Marital status: Single    Spouse name: Not on file   Number of children: Not on file   Years of education: Not on file   Highest education level: 12th grade  Occupational History  Not on file  Tobacco Use   Smoking status: Never   Smokeless tobacco: Never  Vaping Use   Vaping status: Never Used  Substance and Sexual Activity   Alcohol use: Not Currently   Drug use: No   Sexual activity: Not Currently    Partners: Male    Birth control/protection: Post-menopausal, Abstinence  Other Topics Concern   Not on file  Social History Narrative   Not on file   Social Drivers of Health   Financial Resource Strain: High Risk (12/18/2023)   Overall Financial Resource Strain (CARDIA)    Difficulty of Paying Living Expenses: Hard  Food Insecurity: Food Insecurity Present (12/18/2023)   Hunger Vital Sign    Worried About Running Out of Food in the Last Year: Sometimes true    Ran Out of Food in the Last Year:  Sometimes true  Transportation Needs: Unmet Transportation Needs (12/18/2023)   PRAPARE - Administrator, Civil Service (Medical): Yes    Lack of Transportation (Non-Medical): No  Physical Activity: Unknown (11/27/2023)   Exercise Vital Sign    Days of Exercise per Week: 0 days    Minutes of Exercise per Session: Not on file  Stress: Stress Concern Present (11/27/2023)   [employer] of Occupational Health - Occupational Stress Questionnaire    Feeling of Stress : Rather much  Social Connections: Socially Isolated (11/27/2023)   Social Connection and Isolation Panel    Frequency of Communication with Friends and Family: Once a week    Frequency of Social Gatherings with Friends and Family: Never    Attends Religious Services: Never    Database administrator or Organizations: No    Attends Engineer, structural: Not on file    Marital Status: Never married  Catering manager Violence: Not on file     OBSERVATIONS/OBJECTIVE:  BP 100/68   Pulse 73   Temp (!) 97.3 F (36.3 C) (Temporal)   Resp 18   Ht 5' 2.25 (1.581 m)   Wt 166 lb 3.2 oz (75.4 kg)   LMP  (LMP Unknown)   SpO2 100%   BMI 30.15 kg/m  GENERAL: Patient is a well appearing female in no acute distress HEENT:  Sclerae anicteric.  Oropharynx clear and moist. No ulcerations or evidence of oropharyngeal candidiasis. Neck is supple.  NODES:  No cervical, supraclavicular, or axillary lymphadenopathy palpated.  BREAST EXAM: right breast s/p lumpectomy and radiation, no sign of local recurrence, left breast benign LUNGS:  Clear to auscultation bilaterally.  No wheezes or rhonchi. HEART:  Regular rate and rhythm. No murmur appreciated. ABDOMEN:  Soft, nontender.  Positive, normoactive bowel sounds. No organomegaly palpated. MSK:  No focal spinal tenderness to palpation. Full range of motion bilaterally in the upper extremities. EXTREMITIES:  No peripheral edema.   SKIN:  Clear with no obvious rashes or  skin changes. No nail dyscrasia. NEURO:  Nonfocal. Well oriented.  Appropriate affect.   LABORATORY DATA:  None for this visit.  DIAGNOSTIC IMAGING:  None for this visit.      ASSESSMENT AND PLAN:  Ms.. Carter is a pleasant 53 y.o. female with Stage IIB right breast invasive ductal carcinoma, ER+/PR-/HER2-, diagnosed in 09/2024, treated with neoadjuvant chemotherapy, lumpectomy, maintenance Keytruda , adjuvant radiation therapy, and anti-estrogen therapy with Tamoxifen  beginning in 12/2023.  She presents to the Survivorship Clinic for our initial meeting and routine follow-up post-completion of treatment for breast cancer.    1. Stage IIB right breast cancer:  Ms.  Carter is continuing to recover from definitive treatment for breast cancer. She will follow-up with her medical oncologist, Dr. Loretha in 3-6 months with history and physical exam per surveillance protocol.  She will continue her anti-estrogen therapy with Tamoxifen . Thus far, she is tolerating the Tamoxifen  well, with minimal side effects. Her mammogram is due 10/2024; orders placed today.   Today, a comprehensive survivorship care plan and treatment summary was reviewed with the patient today detailing her breast cancer diagnosis, treatment course, potential late/long-term effects of treatment, appropriate follow-up care with recommendations for the future, and patient education resources.  A copy of this summary, along with a letter will be sent to the patient's primary care provider via mail/fax/In Basket message after today's visit.    2. Fatigue and CIPN: Will obtain some labs to test her hemoglobin, electrolytes, and for vitamin deficiencies which may be contributing.    3. Bone health:  Following bone density with her PCP  She was given education on specific activities to promote bone health.  4. Cancer screening:  Due to Maria Carter's history and her age, she should receive screening for skin cancers, colon cancer, and gynecologic  cancers.  The information and recommendations are listed on the patient's comprehensive care plan/treatment summary and were reviewed in detail with the patient.    5. Health maintenance and wellness promotion: Maria Carter was encouraged to consume 5-7 servings of fruits and vegetables per day. We reviewed the Nutrition Rainbow handout.  She was also encouraged to engage in moderate to vigorous exercise for 30 minutes per day most days of the week.  She was instructed to limit her alcohol consumption and continue to abstain from tobacco use.     6. Support services/counseling: It is not uncommon for this period of the patient's cancer care trajectory to be one of many emotions and stressors.   She was given information regarding our available services and encouraged to contact me with any questions or for help enrolling in any of our support group/programs.    Follow up instructions:    -Return to cancer center in 05/2024 for f/u with Dr. Loretha -Mammogram due in 10/2024 -She is welcome to return back to the Survivorship Clinic at any time; no additional follow-up needed at this time.  -Consider referral back to survivorship as a long-term survivor for continued surveillance  The patient was provided an opportunity to ask questions and all were answered. The patient agreed with the plan and demonstrated an understanding of the instructions.   Total encounter time:40 minutes*in face-to-face visit time, chart review, lab review, care coordination, order entry, and documentation of the encounter time.    Morna Kendall, NP 02/22/24 6:15 AM Medical Oncology and Hematology [facility] [address] [place], [place] [address] Tel. [phone]    Fax. [phone]  *Total Encounter Time as defined by the Centers for Medicare and Medicaid Services includes, in addition to the face-to-face time of a patient visit (documented in the note above) non-face-to-face time: obtaining and  reviewing outside history, ordering and reviewing medications, tests or procedures, care coordination (communications with other health care professionals or caregivers) and documentation in the medical record.

## 2024-02-19 ENCOUNTER — Ambulatory Visit: Payer: Self-pay

## 2024-02-19 ENCOUNTER — Other Ambulatory Visit: Payer: Self-pay

## 2024-02-19 DIAGNOSIS — E559 Vitamin D deficiency, unspecified: Secondary | ICD-10-CM

## 2024-02-19 DIAGNOSIS — F411 Generalized anxiety disorder: Secondary | ICD-10-CM | POA: Diagnosis not present

## 2024-02-19 DIAGNOSIS — F330 Major depressive disorder, recurrent, mild: Secondary | ICD-10-CM | POA: Diagnosis not present

## 2024-02-19 MED ORDER — ERGOCALCIFEROL 1.25 MG (50000 UT) PO CAPS: 50000.0000 [IU] | ORAL_CAPSULE | ORAL | 1 refills | Status: AC

## 2024-02-19 NOTE — Telephone Encounter (Addendum)
-----   Spoke with pt. Regarding Np message below. Pt is agreeable.Message from Conway Dennis sent at 02/19/2024  8:05 AM EDT ----- Vitamin d level is low.  Please call patient and let her know and call in ergocalciferol 50,000 units weekly disp 12 1 refill Please have lab in 12 weeks with repeat vitamin d level Please tell her that I recommend she take the medication at night.    Thanks, LC ----- Message ----- From: Interface, Lab In [place] Sent: 02/18/2024   3:15 PM EDT To: Percival Brace, NP

## 2024-02-22 ENCOUNTER — Encounter: Payer: Self-pay | Admitting: Hematology and Oncology

## 2024-02-23 DIAGNOSIS — F439 Reaction to severe stress, unspecified: Secondary | ICD-10-CM | POA: Diagnosis not present

## 2024-02-23 DIAGNOSIS — F322 Major depressive disorder, single episode, severe without psychotic features: Secondary | ICD-10-CM | POA: Diagnosis not present

## 2024-02-23 DIAGNOSIS — F411 Generalized anxiety disorder: Secondary | ICD-10-CM | POA: Diagnosis not present

## 2024-03-01 ENCOUNTER — Ambulatory Visit

## 2024-03-08 ENCOUNTER — Ambulatory Visit: Admitting: Rehabilitation

## 2024-03-09 ENCOUNTER — Encounter: Payer: Self-pay | Admitting: Obstetrics and Gynecology

## 2024-03-09 MED ORDER — METRONIDAZOLE 0.75 % VA GEL: 1.0000 | Freq: Every day | VAGINAL | 0 refills | Status: AC

## 2024-03-09 NOTE — Telephone Encounter (Signed)
 Dr. Glennon -treated with Flagyl  on 01/22/24. Please advise if ok to send Metrogel ? See patient message.

## 2024-03-10 DIAGNOSIS — F322 Major depressive disorder, single episode, severe without psychotic features: Secondary | ICD-10-CM | POA: Diagnosis not present

## 2024-03-10 DIAGNOSIS — F439 Reaction to severe stress, unspecified: Secondary | ICD-10-CM | POA: Diagnosis not present

## 2024-03-10 DIAGNOSIS — F411 Generalized anxiety disorder: Secondary | ICD-10-CM | POA: Diagnosis not present

## 2024-03-15 ENCOUNTER — Encounter

## 2024-03-18 ENCOUNTER — Ambulatory Visit: Payer: Self-pay | Admitting: Family Medicine

## 2024-03-18 ENCOUNTER — Encounter: Payer: Self-pay | Admitting: Family Medicine

## 2024-03-18 ENCOUNTER — Telehealth: Payer: Self-pay

## 2024-03-18 ENCOUNTER — Ambulatory Visit: Admitting: Family Medicine

## 2024-03-18 VITALS — BP 96/60 | HR 68 | Temp 97.8°F | Ht 62.25 in | Wt 170.0 lb

## 2024-03-18 DIAGNOSIS — D649 Anemia, unspecified: Secondary | ICD-10-CM | POA: Diagnosis not present

## 2024-03-18 DIAGNOSIS — Z95828 Presence of other vascular implants and grafts: Secondary | ICD-10-CM

## 2024-03-18 DIAGNOSIS — D696 Thrombocytopenia, unspecified: Secondary | ICD-10-CM

## 2024-03-18 DIAGNOSIS — M6281 Muscle weakness (generalized): Secondary | ICD-10-CM

## 2024-03-18 DIAGNOSIS — E039 Hypothyroidism, unspecified: Secondary | ICD-10-CM

## 2024-03-18 DIAGNOSIS — F419 Anxiety disorder, unspecified: Secondary | ICD-10-CM

## 2024-03-18 DIAGNOSIS — R5383 Other fatigue: Secondary | ICD-10-CM

## 2024-03-18 DIAGNOSIS — F32A Depression, unspecified: Secondary | ICD-10-CM

## 2024-03-18 DIAGNOSIS — Z853 Personal history of malignant neoplasm of breast: Secondary | ICD-10-CM

## 2024-03-18 DIAGNOSIS — M79662 Pain in left lower leg: Secondary | ICD-10-CM

## 2024-03-18 LAB — CBC WITH DIFFERENTIAL/PLATELET
Basophils Absolute: 0 K/uL (ref 0.0–0.1)
Basophils Relative: 0.3 % (ref 0.0–3.0)
Eosinophils Absolute: 0 K/uL (ref 0.0–0.7)
Eosinophils Relative: 1 % (ref 0.0–5.0)
HCT: 34.9 % — ABNORMAL LOW (ref 36.0–46.0)
Hemoglobin: 11.4 g/dL — ABNORMAL LOW (ref 12.0–15.0)
Lymphocytes Relative: 25.7 % (ref 12.0–46.0)
Lymphs Abs: 0.6 K/uL — ABNORMAL LOW (ref 0.7–4.0)
MCHC: 32.8 g/dL (ref 30.0–36.0)
MCV: 88.5 fl (ref 78.0–100.0)
Monocytes Absolute: 0.2 K/uL (ref 0.1–1.0)
Monocytes Relative: 8.1 % (ref 3.0–12.0)
Neutro Abs: 1.5 K/uL (ref 1.4–7.7)
Neutrophils Relative %: 64.9 % (ref 43.0–77.0)
Platelets: 149 K/uL — ABNORMAL LOW (ref 150.0–400.0)
RBC: 3.95 Mil/uL (ref 3.87–5.11)
RDW: 13.8 % (ref 11.5–15.5)
WBC: 2.3 K/uL — ABNORMAL LOW (ref 4.0–10.5)

## 2024-03-18 LAB — COMPREHENSIVE METABOLIC PANEL WITH GFR
ALT: 13 U/L (ref 0–35)
AST: 20 U/L (ref 0–37)
Albumin: 4.4 g/dL (ref 3.5–5.2)
Alkaline Phosphatase: 81 U/L (ref 39–117)
BUN: 13 mg/dL (ref 6–23)
CO2: 29 meq/L (ref 19–32)
Calcium: 9.8 mg/dL (ref 8.4–10.5)
Chloride: 101 meq/L (ref 96–112)
Creatinine, Ser: 0.67 mg/dL (ref 0.40–1.20)
GFR: 100.68 mL/min (ref >60.00)
Glucose, Bld: 103 mg/dL — ABNORMAL HIGH (ref 70–99)
Potassium: 4 meq/L (ref 3.5–5.1)
Sodium: 137 meq/L (ref 135–145)
Total Bilirubin: 0.4 mg/dL (ref 0.2–1.2)
Total Protein: 7.8 g/dL (ref 6.0–8.3)

## 2024-03-18 LAB — CK: Total CK: 58 U/L (ref 17–177)

## 2024-03-18 LAB — TSH: TSH: 6.99 u[IU]/mL — ABNORMAL HIGH (ref 0.35–5.50)

## 2024-03-18 LAB — FERRITIN: Ferritin: 50.5 ng/mL (ref 10.0–291.0)

## 2024-03-18 LAB — VITAMIN B12: Vitamin B-12: 547 pg/mL (ref 211–911)

## 2024-03-18 LAB — MAGNESIUM: Magnesium: 1.7 mg/dL (ref 1.5–2.5)

## 2024-03-18 LAB — FOLATE: Folate: 23.1 ng/mL (ref >5.9)

## 2024-03-18 LAB — D-DIMER, QUANTITATIVE: D-Dimer, Quant: 0.32 ug{FEU}/mL (ref <0.50)

## 2024-03-18 NOTE — Progress Notes (Addendum)
 Subjective:     Patient ID: Maria Carter, female    DOB: Mar 19, 1971, 53 y.o.   MRN: [id number]  Chief Complaint  Patient presents with   Fatigue    Having a lot of pain everywhere but also a lot in left leg. Sometimes goes numb. Getting headaches and tired. Took tylenol  this morning to help her walk.     HPI   History of Present Illness         C/o left calf pain x 1 week. No significant swelling or redness. No hx of DVT or PE. Denies injury.  Recently completed treatment for breast cancer.  Occasional tachycardia but associated with anxiety per patient.  No chest pain or shortness of breath.  Complains of severe fatigue and generalized muscle weakness at times.  Port a cath still in place.   Surgery with Dr. Glennon for polyps in the near future.   Colonoscopy scheduled   Urine bright yellow. No frequency, urgency or dysuria   Being treated for BV with vaginal   TSH high at 14.7 Taking 50 mcg of levothyroxine  on empty stomach.  Referred to endocrinologist       Health Maintenance Due  Topic Date Due   Medicare Annual Wellness (AWV)  Never done   DTaP/Tdap/Td (1 - Tdap) Never done   Pneumococcal Vaccine 49-53 Years old (1 of 2 - PCV) Never done   Hepatitis B Vaccines (1 of 3 - 19+ 3-dose series) Never done   Zoster Vaccines- Shingrix (1 of 2) Never done   Colonoscopy  Never done    Past Medical History:  Diagnosis Date   Anxiety    Breast cancer (HCC)    right breast IDC   Depression    Hx of febrile seizure    1980   Hypothyroidism    Osteopenia    2025 -1.8 frax neg. repeat in 2 years    Past Surgical History:  Procedure Laterality Date   BREAST BIOPSY Right 09/30/2022   US  RT BREAST BX W LOC DEV 1ST LESION IMG BX SPEC US  GUIDE 09/30/2022 [facility]   BREAST BIOPSY  05/20/2023   MM RT RADIOACTIVE SEED LOC MAMMO GUIDE 05/20/2023 [facility]   BREAST LUMPECTOMY Right 05/21/2023   BREAST LUMPECTOMY WITH RADIOACTIVE SEED AND  SENTINEL LYMPH NODE BIOPSY Right 05/21/2023   Procedure: RIGHT BREAST LUMPECTOMY WITH RADIOACTIVE SEED AND SENTINEL LYMPH NODE BIOPSY;  Surgeon: [name], MD;  Location: [facility];  Service: General;  Laterality: Right;  LMA PEC BLOCK   EXCISION OF KELOID N/A 05/21/2023   Procedure: EXCISION CHEST WALL SCAR;  Surgeon: [name], MD;  Location: [facility];  Service: General;  Laterality: N/A;   PORTACATH PLACEMENT N/A 10/23/2022   Procedure: INSERTION PORT-A-CATH;  Surgeon: [name], MD;  Location: [facility];  Service: General;  Laterality: N/A;    Family History  Problem Relation Age of Onset   Multiple myeloma Father    Breast cancer Sister 27 - 27   Cancer Paternal Grandmother        unknown type    Social History   Socioeconomic History   Marital status: Single    Spouse name: Not on file   Number of children: Not on file   Years of education: Not on file   Highest education level: GED or equivalent  Occupational History   Not on file  Tobacco Use   Smoking status: Never   Smokeless tobacco:  Never  Vaping Use   Vaping status: Never Used  Substance and Sexual Activity   Alcohol use: Not Currently   Drug use: No   Sexual activity: Not Currently    Partners: Male    Birth control/protection: Post-menopausal, Abstinence  Other Topics Concern   Not on file  Social History Narrative   Not on file   Social Drivers of Health   Financial Resource Strain: Medium Risk (03/12/2024)   Overall Financial Resource Strain (CARDIA)    Difficulty of Paying Living Expenses: Somewhat hard  Food Insecurity: No Food Insecurity (03/12/2024)   Hunger Vital Sign    Worried About Running Out of Food in the Last Year: Never true    Ran Out of Food in the Last Year: Never true  Recent Concern: Food Insecurity - Food Insecurity Present (12/18/2023)   Hunger Vital Sign    Worried About Running Out of Food in the Last  Year: Sometimes true    Ran Out of Food in the Last Year: Sometimes true  Transportation Needs: Unmet Transportation Needs (03/12/2024)   PRAPARE - Transportation    Lack of Transportation (Medical): Yes    Lack of Transportation (Non-Medical): Yes  Physical Activity: Inactive (03/12/2024)   Exercise Vital Sign    Days of Exercise per Week: 0 days    Minutes of Exercise per Session: Not on file  Stress: Stress Concern Present (03/12/2024)   [employer] of Occupational Health - Occupational Stress Questionnaire    Feeling of Stress: Very much  Social Connections: Moderately Isolated (03/12/2024)   Social Connection and Isolation Panel    Frequency of Communication with Friends and Family: Three times a week    Frequency of Social Gatherings with Friends and Family: Never    Attends Religious Services: Never    Database administrator or Organizations: Yes    Attends Banker Meetings: Never    Marital Status: Never married  Catering manager Violence: Not on file    Outpatient Medications Prior to Visit  Medication Sig Dispense Refill   ALPRAZolam (XANAX) 0.25 MG tablet Take 1 tab  daily  prn anxiety     busPIRone (BUSPAR) 15 MG tablet Take by mouth.     desvenlafaxine (PRISTIQ) 50 MG 24 hr tablet Take 50 mg by mouth daily.     ergocalciferol  (VITAMIN D2) 1.25 MG (50000 UT) capsule Take 1 capsule (50,000 Units total) by mouth once a week. 12 capsule 1   levothyroxine  (SYNTHROID ) 25 MCG tablet Take 25 mcg by mouth daily before breakfast. Take 2 tablets once daily     Multiple Vitamins-Minerals (MULTIVITAMIN ADULTS PO) Take 1 tablet by mouth daily.     tamoxifen  (NOLVADEX ) 20 MG tablet Take 1 tablet (20 mg total) by mouth daily. 90 tablet 1   No facility-administered medications prior to visit.    No Known Allergies  Review of Systems  Constitutional:  Positive for malaise/fatigue. Negative for chills and fever.  Respiratory:  Negative for cough and shortness of  breath.   Cardiovascular:  Negative for chest pain, palpitations and leg swelling.  Gastrointestinal:  Negative for abdominal pain, constipation, diarrhea, nausea and vomiting.  Genitourinary:  Negative for dysuria, frequency and urgency.  Musculoskeletal:  Positive for myalgias. Negative for falls.       Left calf pain  Skin:  Negative for rash.  Neurological:  Negative for dizziness, focal weakness and headaches.  Psychiatric/Behavioral:  The patient is nervous/anxious.  Objective:    Physical Exam Constitutional:      General: She is not in acute distress.    Appearance: She is not ill-appearing.  HENT:     Mouth/Throat:     Mouth: Mucous membranes are moist.     Pharynx: Oropharynx is clear.  Eyes:     Extraocular Movements: Extraocular movements intact.     Conjunctiva/sclera: Conjunctivae normal.  Cardiovascular:     Rate and Rhythm: Normal rate and regular rhythm.  Pulmonary:     Effort: Pulmonary effort is normal.     Breath sounds: Normal breath sounds.  Musculoskeletal:     Cervical back: Normal range of motion and neck supple.     Right lower leg: No edema.     Left lower leg: Tenderness present. No edema.     Left ankle: Normal.     Comments: Left lower extremity neurovascularly intact.  Left calf pain with ambulation.  TTP to left calf.  Positive Homans.  Skin:    General: Skin is warm and dry.     Findings: No erythema.  Neurological:     General: No focal deficit present.     Mental Status: She is alert and oriented to person, place, and time.     Cranial Nerves: No cranial nerve deficit.     Motor: No weakness.     Coordination: Coordination normal.     Gait: Gait normal.  Psychiatric:        Mood and Affect: Mood normal.        Behavior: Behavior normal.        Thought Content: Thought content normal.      BP 96/60 (BP Location: Left Arm, Patient Position: Sitting)   Pulse 68   Temp 97.8 F (36.6 C) (Temporal)   Ht 5' 2.25 (1.581 m)    Wt 170 lb (77.1 kg)   LMP  (LMP Unknown)   SpO2 98%   BMI 30.84 kg/m  Wt Readings from Last 3 Encounters:  03/18/24 170 lb (77.1 kg)  02/18/24 166 lb 3.2 oz (75.4 kg)  02/08/24 164 lb 11.2 oz (74.7 kg)       Assessment & Plan:   Problem List Items Addressed This Visit     Acquired hypothyroidism   Relevant Orders   TSH   Port-A-Cath in place   Thrombocytopenia (HCC)   Relevant Orders   CBC with Differential/Platelet   Other Visit Diagnoses       Pain of left calf    -  Primary   Relevant Orders   VAS US  LOWER EXTREMITY VENOUS (DVT)   CK (Creatine Kinase)   D-dimer, quantitative     Anxiety and depression         Fatigue, unspecified type       Relevant Orders   CBC with Differential/Platelet   Comprehensive metabolic panel with GFR   TSH   Vitamin B12   Magnesium   Ferritin   Folate     Anemia, unspecified type       Relevant Orders   CBC with Differential/Platelet   Comprehensive metabolic panel with GFR   Vitamin B12   Ferritin   Folate     History of breast cancer       Relevant Orders   VAS US  LOWER EXTREMITY VENOUS (DVT)     Muscle weakness       Relevant Orders   CBC with Differential/Platelet   Comprehensive metabolic panel with GFR   Vitamin  B12   Magnesium   CK (Creatine Kinase)      Recently completed treatment for breast cancer.  She still has reported. Complains of a 1 week history of left calf pain without swelling or redness.  Positive Homans.  Calf is tender. LLE neurovascularly intact.  Stat ultrasound ordered to rule out DVT. D-dimer ordered.  Occasional tachycardia associated with anxiety per patient. No chest pain or shortness of breath. Check labs to look for underlying etiology for severe fatigue. TSH elevated.  Currently taking levothyroxine  50 mcg.  Increase dose if TSH is still elevated.  She has an appointment to see endocrinology later this year. Continue vitamin D  supplementation Start B12 Follow-up pending  results. Strict precautions that if she has any new or worsening symptoms that she will go to the ED or call 911.  I am having Maria Carter maintain her Multiple Vitamins-Minerals (MULTIVITAMIN ADULTS PO), busPIRone, levothyroxine , tamoxifen , ALPRAZolam, desvenlafaxine, and ergocalciferol .  No orders of the defined types were placed in this encounter.

## 2024-03-18 NOTE — Telephone Encounter (Signed)
 CRITICAL VALUE STICKER  CRITICAL VALUE: WBC 2.3  RECEIVER (on-site recipient of call): Chiquita  DATE & TIME NOTIFIED: 03/18/24  MESSENGER (representative from lab): clamina

## 2024-03-18 NOTE — Telephone Encounter (Signed)
 PCP out of office, looks to be her average

## 2024-03-18 NOTE — Patient Instructions (Signed)
 Please go downstairs for labs before you leave.  I am ordering an ultrasound of your left lower leg to rule out a blood clot.  You will receive a call to get this done.  If you have any worsening symptoms or new symptoms such as chest pain or shortness of breath while you are waiting to get this test done, you should go to the emergency department.

## 2024-03-18 NOTE — Addendum Note (Signed)
 Addended by: Mihcael Ledee E on: 03/18/2024 01:00 PM   Modules accepted: Orders

## 2024-03-18 NOTE — Telephone Encounter (Signed)
 This appears to be chronic stable, ok to continue to monitor, no new orders

## 2024-03-21 ENCOUNTER — Inpatient Hospital Stay: Attending: Physician Assistant

## 2024-03-21 ENCOUNTER — Ambulatory Visit (HOSPITAL_COMMUNITY)

## 2024-03-21 DIAGNOSIS — Z1722 Progesterone receptor negative status: Secondary | ICD-10-CM | POA: Diagnosis not present

## 2024-03-21 DIAGNOSIS — C50411 Malignant neoplasm of upper-outer quadrant of right female breast: Secondary | ICD-10-CM | POA: Insufficient documentation

## 2024-03-21 DIAGNOSIS — Z170 Estrogen receptor positive status [ER+]: Secondary | ICD-10-CM | POA: Insufficient documentation

## 2024-03-21 DIAGNOSIS — Z79810 Long term (current) use of selective estrogen receptor modulators (SERMs): Secondary | ICD-10-CM | POA: Diagnosis not present

## 2024-03-21 DIAGNOSIS — Z1732 Human epidermal growth factor receptor 2 negative status: Secondary | ICD-10-CM | POA: Diagnosis not present

## 2024-03-21 DIAGNOSIS — Z452 Encounter for adjustment and management of vascular access device: Secondary | ICD-10-CM | POA: Diagnosis not present

## 2024-03-21 DIAGNOSIS — Z95828 Presence of other vascular implants and grafts: Secondary | ICD-10-CM

## 2024-03-21 MED ORDER — SODIUM CHLORIDE 0.9% FLUSH
10.0000 mL | Freq: Once | INTRAVENOUS | Status: AC
Start: 2024-03-21 — End: 2024-03-21
  Administered 2024-03-21: 10 mL

## 2024-03-21 MED ORDER — HEPARIN SOD (PORK) LOCK FLUSH 100 UNIT/ML IV SOLN
500.0000 [IU] | Freq: Once | INTRAVENOUS | Status: AC
Start: 2024-03-21 — End: 2024-03-21
  Administered 2024-03-21: 500 [IU]

## 2024-03-22 ENCOUNTER — Ambulatory Visit (HOSPITAL_COMMUNITY)
Admission: RE | Admit: 2024-03-22 | Discharge: 2024-03-22 | Disposition: A | Discharge status: Home / Self Care | Source: Ambulatory Visit | Attending: Family Medicine | Admitting: Family Medicine

## 2024-03-22 DIAGNOSIS — Z853 Personal history of malignant neoplasm of breast: Secondary | ICD-10-CM | POA: Diagnosis not present

## 2024-03-22 DIAGNOSIS — M79662 Pain in left lower leg: Secondary | ICD-10-CM | POA: Insufficient documentation

## 2024-03-28 ENCOUNTER — Ambulatory Visit (AMBULATORY_SURGERY_CENTER): Admitting: *Deleted

## 2024-03-28 ENCOUNTER — Encounter: Payer: Self-pay | Admitting: Gastroenterology

## 2024-03-28 VITALS — Ht 63.0 in | Wt 170.0 lb

## 2024-03-28 DIAGNOSIS — Z1211 Encounter for screening for malignant neoplasm of colon: Secondary | ICD-10-CM

## 2024-03-28 MED ORDER — NA SULFATE-K SULFATE-MG SULF 17.5-3.13-1.6 GM/177ML PO SOLN: 1.0000 | Freq: Once | ORAL | 0 refills | Status: AC

## 2024-03-28 NOTE — Progress Notes (Signed)
 Pt's name and DOB verified at the beginning of the pre-visit wit 2 identifiers  Pt denies any difficulty with ambulating,sitting, laying down or rolling side to side  Pt has no issues moving head neck or swallowing  No egg or soy allergy known to patient   No issues known to pt with past sedation with any surgeries or procedures  No FH of Malignant Hyperthermia  Pt is not on home 02   Pt is not on blood thinners   Pt denies issues with constipation   Pt is not on dialysis  Pt denise any abnormal heart rhythms   Pt denies any upcoming cardiac testing  Patient's chart reviewed by Norleen Schillings CNRA prior to pre-visit and patient appropriate for the [facility].  Pre-visit completed and red dot placed by patient's name on their procedure day (on provider's schedule).    Visit by phone  Pt states weight is 170 lb  IInstructions reviewed. Pt given  both [facility] main # and MD on call # prior to instructions.  Pt states understanding of instructions. Instructed pt to review instructions again prior to procedure and call main # given if has questions.. Pt states they will.   Instructed pt on where to find instructions on My Chart.

## 2024-03-30 ENCOUNTER — Encounter: Admitting: Obstetrics and Gynecology

## 2024-04-05 ENCOUNTER — Encounter: Payer: Self-pay | Admitting: *Deleted

## 2024-04-05 ENCOUNTER — Encounter (HOSPITAL_COMMUNITY): Payer: Self-pay | Admitting: Obstetrics and Gynecology

## 2024-04-05 IMAGING — DX DG CHEST 2V
2 series · 2 of 2 positions shown · non-contrast
Comparison: July 11, 2008.

CLINICAL DATA: Chest pain.

EXAM:
CHEST - 2 VIEW

[w chest pa]
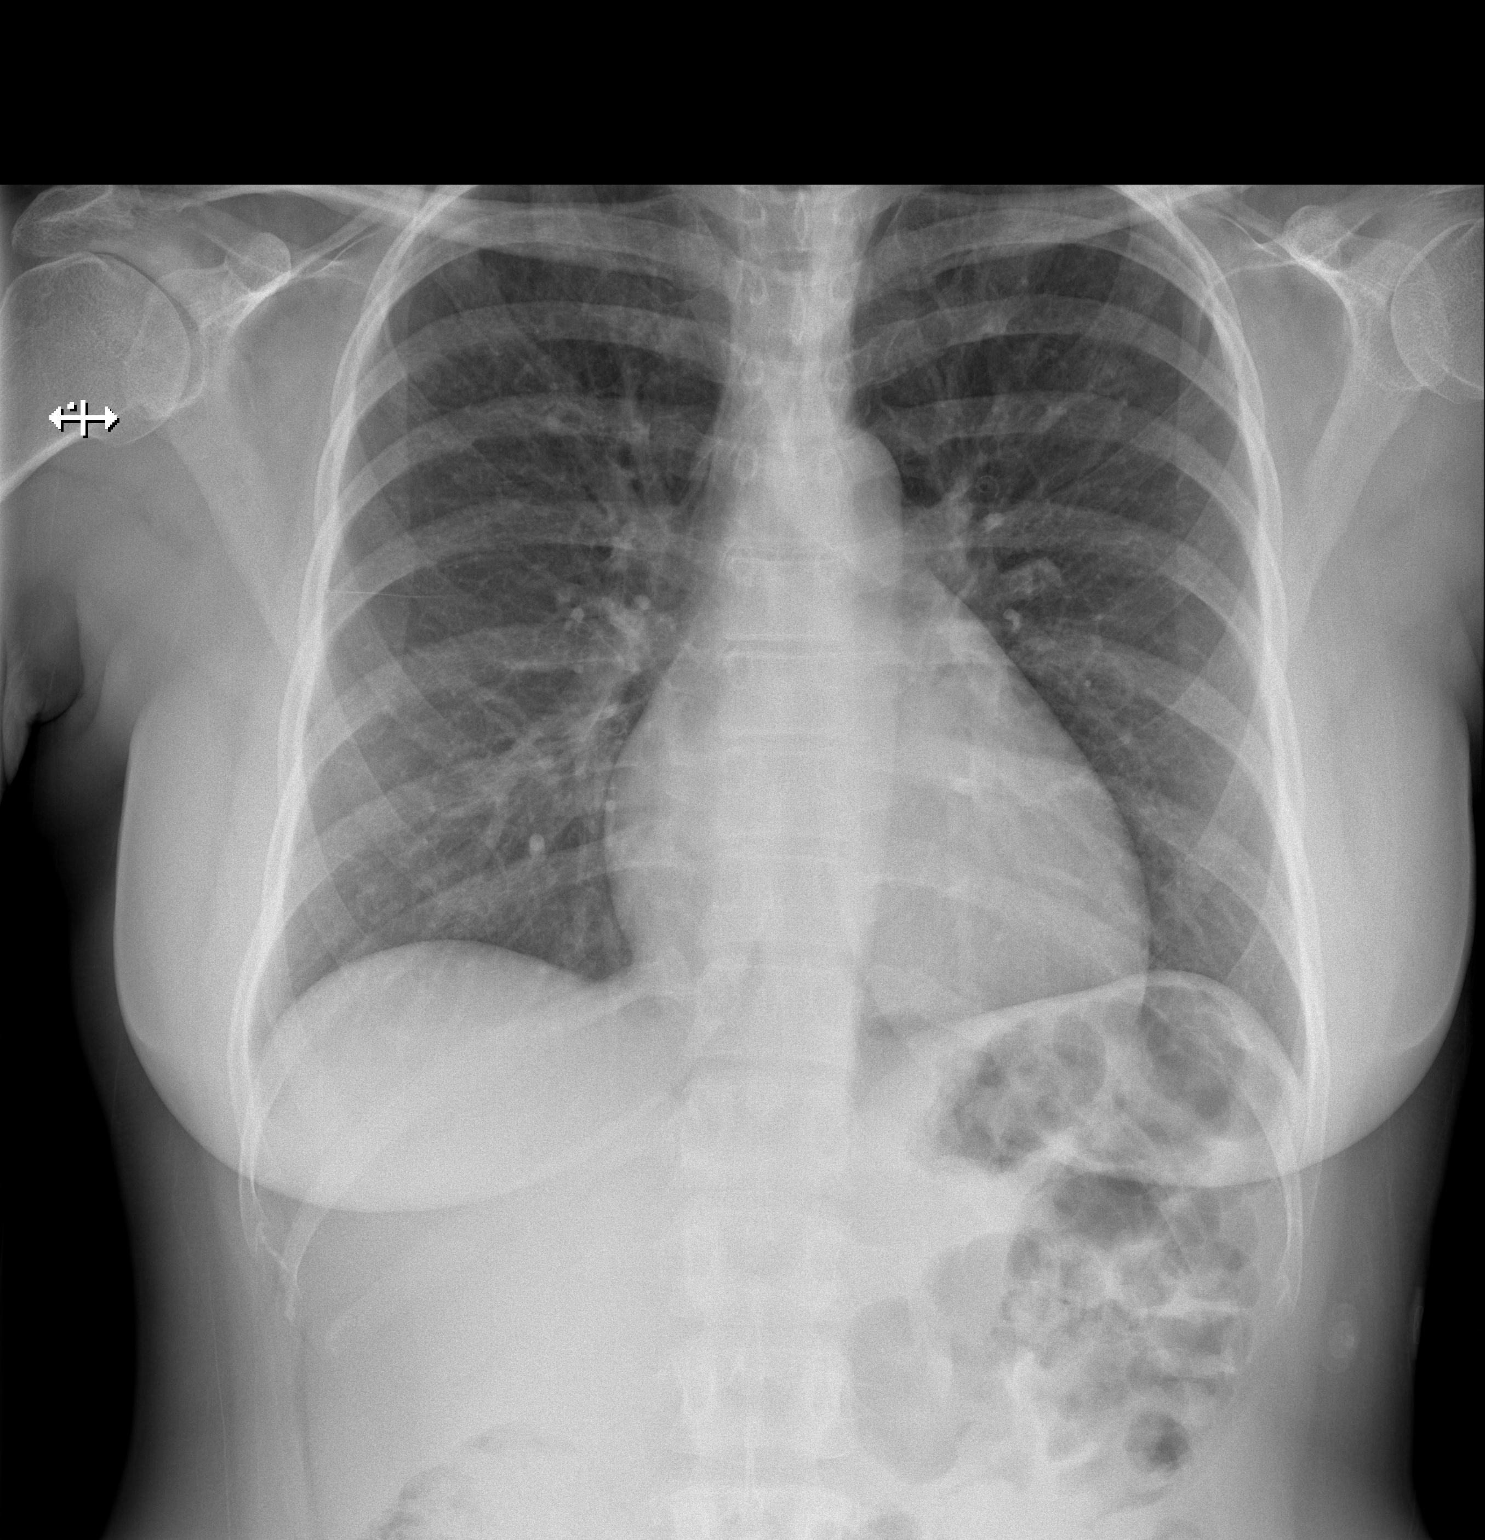

[w chest lat]
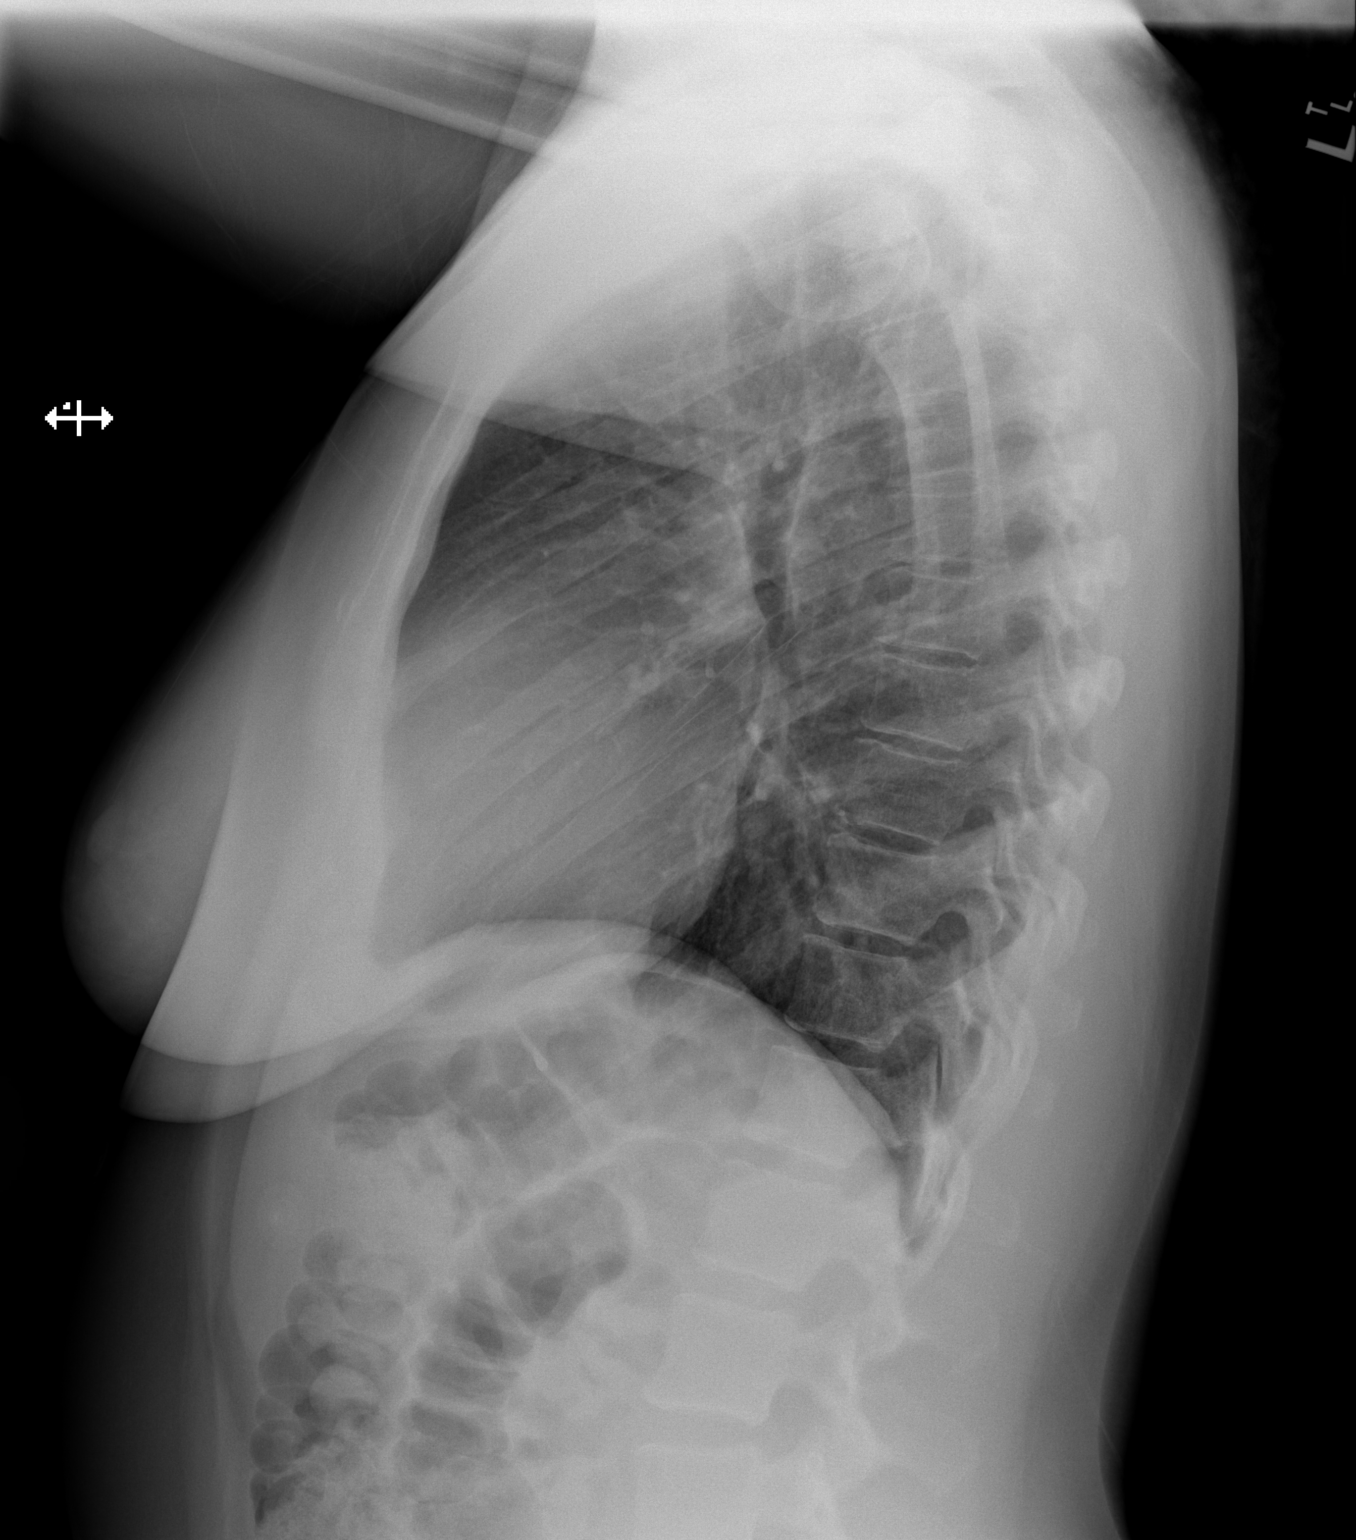

[2 of 2 positions shown; findings below may reference images not displayed]

FINDINGS: The heart size and mediastinal contours are within normal limits.
Both lungs are clear. The visualized skeletal structures are
unremarkable.
IMPRESSION: No active cardiopulmonary disease.

## 2024-04-05 NOTE — Progress Notes (Signed)
 Spoke w/ via phone for pre-op interview--- Jon Malm needs dos---- surgeon orders requested 04/05/24.        Lab results------ COVID test -----patient states asymptomatic no test needed Arrive at -------1130 NPO after MN NO Solid Food.  Clear liquids from MN until---1030 Pre-Surgery Ensure or G2:  Med rec completed Medications to take morning of surgery ----- Xanax PRN, Buspar, Pristiq and Levothyroxine . Diabetic medication -----  GLP1 agonist last dose: GLP1 instructions:  Patient instructed no nail polish to be worn day of surgery Patient instructed to bring photo id and insurance card day of surgery Patient aware to have Driver (ride ) / caregiver    for 24 hours after surgery - Partner Josefa Ned Patient Special Instructions ----- Pre-Op special Instructions -----  Patient verbalized understanding of instructions that were given at this phone interview. Patient denies chest pain, sob, fever, cough at the interview.

## 2024-04-06 ENCOUNTER — Encounter: Payer: Self-pay | Admitting: Gastroenterology

## 2024-04-06 ENCOUNTER — Ambulatory Visit (AMBULATORY_SURGERY_CENTER): Admitting: Gastroenterology

## 2024-04-06 VITALS — BP 123/61 | HR 66 | Temp 97.9°F | Resp 19 | Ht 63.0 in | Wt 170.0 lb

## 2024-04-06 DIAGNOSIS — K5730 Diverticulosis of large intestine without perforation or abscess without bleeding: Secondary | ICD-10-CM | POA: Diagnosis not present

## 2024-04-06 DIAGNOSIS — F419 Anxiety disorder, unspecified: Secondary | ICD-10-CM | POA: Diagnosis not present

## 2024-04-06 DIAGNOSIS — Z1211 Encounter for screening for malignant neoplasm of colon: Secondary | ICD-10-CM | POA: Diagnosis not present

## 2024-04-06 DIAGNOSIS — K640 First degree hemorrhoids: Secondary | ICD-10-CM | POA: Diagnosis not present

## 2024-04-06 DIAGNOSIS — F32A Depression, unspecified: Secondary | ICD-10-CM | POA: Diagnosis not present

## 2024-04-06 DIAGNOSIS — E039 Hypothyroidism, unspecified: Secondary | ICD-10-CM | POA: Diagnosis not present

## 2024-04-06 MED ORDER — SODIUM CHLORIDE 0.9 % IV SOLN: 500.0000 mL | Freq: Once | INTRAVENOUS | Status: DC

## 2024-04-06 NOTE — Progress Notes (Signed)
 Report to PACU, RN, vss, BBS= Clear.

## 2024-04-06 NOTE — Progress Notes (Signed)
 Pt's states no medical or surgical changes since previsit or office visit.

## 2024-04-06 NOTE — Progress Notes (Signed)
 GASTROENTEROLOGY PROCEDURE H&P NOTE   Primary Care Physician: Lendia Boby CROME, NP-C  HPI: Maria Carter is a 53 y.o. female who presents for Colonoscopy for screening.  Past Medical History:  Diagnosis Date   Anemia    Anxiety    Breast cancer (HCC)    right breast IDC   Depression    Hx of febrile seizure    1980   Hypothyroidism    Osteopenia    2025 -1.8 frax neg. repeat in 2 years   Past Surgical History:  Procedure Laterality Date   BREAST BIOPSY Right 09/30/2022   US  RT BREAST BX W LOC DEV 1ST LESION IMG BX SPEC US  GUIDE 09/30/2022 [facility]   BREAST BIOPSY  05/20/2023   MM RT RADIOACTIVE SEED LOC MAMMO GUIDE 05/20/2023 [facility]   BREAST LUMPECTOMY Right 05/21/2023   BREAST LUMPECTOMY WITH RADIOACTIVE SEED AND SENTINEL LYMPH NODE BIOPSY Right 05/21/2023   Procedure: RIGHT BREAST LUMPECTOMY WITH RADIOACTIVE SEED AND SENTINEL LYMPH NODE BIOPSY;  Surgeon: [name], MD;  Location: [facility];  Service: General;  Laterality: Right;  LMA PEC BLOCK   EXCISION OF KELOID N/A 05/21/2023   Procedure: EXCISION CHEST WALL SCAR;  Surgeon: [name], MD;  Location: [facility];  Service: General;  Laterality: N/A;   PORTACATH PLACEMENT N/A 10/23/2022   Procedure: INSERTION PORT-A-CATH;  Surgeon: [name], MD;  Location: [facility];  Service: General;  Laterality: N/A;   Current Outpatient Medications  Medication Sig Dispense Refill   ALPRAZolam (XANAX) 0.25 MG tablet Take 1 tab  daily  prn anxiety (Patient taking differently: as needed.)     busPIRone (BUSPAR) 15 MG tablet Take by mouth.     desvenlafaxine (PRISTIQ) 50 MG 24 hr tablet Take 50 mg by mouth daily.     ergocalciferol  (VITAMIN D2) 1.25 MG (50000 UT) capsule Take 1 capsule (50,000 Units total) by mouth once a week. 12 capsule 1   levothyroxine  (SYNTHROID ) 25 MCG tablet Take 25 mcg by mouth daily before breakfast. Take 2 tablets  once daily     Multiple Vitamins-Minerals (MULTIVITAMIN ADULTS PO) Take 1 tablet by mouth daily. (Patient not taking: Reported on 03/28/2024)     tamoxifen  (NOLVADEX ) 20 MG tablet Take 1 tablet (20 mg total) by mouth daily. (Patient not taking: Reported on 03/28/2024) 90 tablet 1   No current facility-administered medications for this visit.    Current Outpatient Medications:    ALPRAZolam (XANAX) 0.25 MG tablet, Take 1 tab  daily  prn anxiety (Patient taking differently: as needed.), Disp: , Rfl:    busPIRone (BUSPAR) 15 MG tablet, Take by mouth., Disp: , Rfl:    desvenlafaxine (PRISTIQ) 50 MG 24 hr tablet, Take 50 mg by mouth daily., Disp: , Rfl:    ergocalciferol  (VITAMIN D2) 1.25 MG (50000 UT) capsule, Take 1 capsule (50,000 Units total) by mouth once a week., Disp: 12 capsule, Rfl: 1   levothyroxine  (SYNTHROID ) 25 MCG tablet, Take 25 mcg by mouth daily before breakfast. Take 2 tablets once daily, Disp: , Rfl:    Multiple Vitamins-Minerals (MULTIVITAMIN ADULTS PO), Take 1 tablet by mouth daily. (Patient not taking: Reported on 03/28/2024), Disp: , Rfl:    tamoxifen  (NOLVADEX ) 20 MG tablet, Take 1 tablet (20 mg total) by mouth daily. (Patient not taking: Reported on 03/28/2024), Disp: 90 tablet, Rfl: 1 No Known Allergies Family History  Problem Relation Age of Onset   Multiple myeloma Father  Breast cancer Sister 3 - 46   Cancer Paternal Grandmother        unknown type   Colon polyps Neg Hx    Colon cancer Neg Hx    Esophageal cancer Neg Hx    Rectal cancer Neg Hx    Stomach cancer Neg Hx    Social History   Socioeconomic History   Marital status: Single    Spouse name: Not on file   Number of children: Not on file   Years of education: Not on file   Highest education level: GED or equivalent  Occupational History   Not on file  Tobacco Use   Smoking status: Never   Smokeless tobacco: Never  Vaping Use   Vaping status: Never Used  Substance and Sexual Activity   Alcohol  use: Not Currently   Drug use: No   Sexual activity: Not Currently    Partners: Male    Birth control/protection: Post-menopausal, Abstinence  Other Topics Concern   Not on file  Social History Narrative   Not on file   Social Drivers of Health   Financial Resource Strain: Medium Risk (03/12/2024)   Overall Financial Resource Strain (CARDIA)    Difficulty of Paying Living Expenses: Somewhat hard  Food Insecurity: No Food Insecurity (03/12/2024)   Hunger Vital Sign    Worried About Running Out of Food in the Last Year: Never true    Ran Out of Food in the Last Year: Never true  Recent Concern: Food Insecurity - Food Insecurity Present (12/18/2023)   Hunger Vital Sign    Worried About Running Out of Food in the Last Year: Sometimes true    Ran Out of Food in the Last Year: Sometimes true  Transportation Needs: Unmet Transportation Needs (03/12/2024)   PRAPARE - Transportation    Lack of Transportation (Medical): Yes    Lack of Transportation (Non-Medical): Yes  Physical Activity: Inactive (03/12/2024)   Exercise Vital Sign    Days of Exercise per Week: 0 days    Minutes of Exercise per Session: Not on file  Stress: Stress Concern Present (03/12/2024)   [employer] of Occupational Health - Occupational Stress Questionnaire    Feeling of Stress: Very much  Social Connections: Moderately Isolated (03/12/2024)   Social Connection and Isolation Panel    Frequency of Communication with Friends and Family: Three times a week    Frequency of Social Gatherings with Friends and Family: Never    Attends Religious Services: Never    Database administrator or Organizations: Yes    Attends Banker Meetings: Never    Marital Status: Never married  Intimate Partner Violence: Not on file    Physical Exam: There were no vitals filed for this visit. There is no height or weight on file to calculate BMI. GEN: NAD EYE: Sclerae anicteric ENT: MMM CV: Non-tachycardic GI: Soft,  NT/ND NEURO:  Alert & Oriented x 3  Lab Results: No results for input(s): WBC, HGB, HCT, PLT in the last 72 hours. BMET No results for input(s): NA, K, CL, CO2, GLUCOSE, BUN, CREATININE, CALCIUM in the last 72 hours. LFT No results for input(s): PROT, ALBUMIN, AST, ALT, ALKPHOS, BILITOT, BILIDIR, IBILI in the last 72 hours. PT/INR No results for input(s): LABPROT, INR in the last 72 hours.   Impression / Plan: This is a 53 y.o.female who presents for Colonoscopy for screening.  The risks and benefits of endoscopic evaluation/treatment were discussed with the patient and/or family; these  include but are not limited to the risk of perforation, infection, bleeding, missed lesions, lack of diagnosis, severe illness requiring hospitalization, as well as anesthesia and sedation related illnesses.  The patient's history has been reviewed, patient examined, no change in status, and deemed stable for procedure.  The patient and/or family is agreeable to proceed.    Aloha Finner, MD [facility] Advanced [facility]oscopy Office # [phone]

## 2024-04-06 NOTE — Op Note (Signed)
 [facility] Patient Name: Maria Carter Procedure Date: 04/06/2024 8:48 AM MRN: [id number] Endoscopist: Aloha Finner , MD, [id number] Age: 53 Referring MD:  Date of Birth: November 03, 1970 Gender: Female Account #: [id number] Procedure:                Colonoscopy Indications:              Screening for colorectal malignant neoplasm, This                            is the patient's first colonoscopy Medicines:                Monitored Anesthesia Care Procedure:                Pre-Anesthesia Assessment:                           - Prior to the procedure, a History and Physical                            was performed, and patient medications and                            allergies were reviewed. The patient's tolerance of                            previous anesthesia was also reviewed. The risks                            and benefits of the procedure and the sedation                            options and risks were discussed with the patient.                            All questions were answered, and informed consent                            was obtained. Prior Anticoagulants: The patient has                            taken no anticoagulant or antiplatelet agents. ASA                            Grade Assessment: III - A patient with severe                            systemic disease. After reviewing the risks and                            benefits, the patient was deemed in satisfactory                            condition to undergo the procedure.  After obtaining informed consent, the colonoscope                            was passed under direct vision. Throughout the                            procedure, the patient's blood pressure, pulse, and                            oxygen saturations were monitored continuously. The                            Olympus Scope [id number] was introduced through the                            anus and  advanced to the 3 cm into the ileum. The                            colonoscopy was performed without difficulty. The                            patient tolerated the procedure. The quality of the                            bowel preparation was good. The terminal ileum,                            ileocecal valve, appendiceal orifice, and rectum                            were photographed. Scope In: 9:03:49 AM Scope Out: 9:16:05 AM Scope Withdrawal Time: 0 hours 8 minutes 48 seconds  Total Procedure Duration: 0 hours 12 minutes 16 seconds  Findings:                 The digital rectal exam was normal. Pertinent                            negatives include no palpable rectal lesions.                           The terminal ileum and ileocecal valve appeared                            normal.                           Multiple small-mouthed diverticula were found in                            the recto-sigmoid colon and sigmoid colon.                           Normal mucosa was found in the entire colon  otherwise.                           Non-bleeding non-thrombosed internal hemorrhoids                            were found during retroflexion, during perianal                            exam and during digital exam. The hemorrhoids were                            Grade I (internal hemorrhoids that do not prolapse). Complications:            No immediate complications. Estimated Blood Loss:     Estimated blood loss: none. Impression:               - The examined portion of the ileum was normal.                           - Diverticulosis in the recto-sigmoid colon and in                            the sigmoid colon.                           - Normal mucosa in the entire examined colon                            otherwise.                           - Non-bleeding non-thrombosed internal hemorrhoids. Recommendation:           - The patient will be observed  post-procedure,                            until all discharge criteria are met.                           - Discharge patient to home.                           - Patient has a contact number available for                            emergencies. The signs and symptoms of potential                            delayed complications were discussed with the                            patient. Return to normal activities tomorrow.                            Written discharge instructions were provided to the  patient.                           - High fiber diet.                           - Use FiberCon 1-2 tablets PO daily.                           - Continue present medications.                           - Repeat colonoscopy in 10 years for screening                            purposes.                           - The findings and recommendations were discussed                            with the patient. Aloha Finner, MD 04/06/2024 9:20:43 AM

## 2024-04-06 NOTE — Patient Instructions (Signed)
 Recommended to have high-fiber diet  Use FiberCon 1-2 tablets daily (this is an over the counter medication) Continue present medications  Repeat colonoscopy in 10 years for screening purposes    YOU HAD AN ENDOSCOPIC PROCEDURE TODAY AT THE [facility]:   Refer to the procedure report that was given to you for any specific questions about what was found during the examination.  If the procedure report does not answer your questions, please call your gastroenterologist to clarify.  If you requested that your care partner not be given the details of your procedure findings, then the procedure report has been included in a sealed envelope for you to review at your convenience later.  YOU SHOULD EXPECT: Some feelings of bloating in the abdomen. Passage of more gas than usual.  Walking can help get rid of the air that was put into your GI tract during the procedure and reduce the bloating. If you had a lower endoscopy (such as a colonoscopy or flexible sigmoidoscopy) you may notice spotting of blood in your stool or on the toilet paper. If you underwent a bowel prep for your procedure, you may not have a normal bowel movement for a few days.  Please Note:  You might notice some irritation and congestion in your nose or some drainage.  This is from the oxygen used during your procedure.  There is no need for concern and it should clear up in a day or so.  SYMPTOMS TO REPORT IMMEDIATELY:  Following lower endoscopy (colonoscopy or flexible sigmoidoscopy):  Excessive amounts of blood in the stool  Significant tenderness or worsening of abdominal pains  Swelling of the abdomen that is new, acute  Fever of 100F or higher  For urgent or emergent issues, a gastroenterologist can be reached at any hour by calling [phone]. Do not use MyChart messaging for urgent concerns.    DIET:  We do recommend a small meal at first, but then you may proceed to your regular diet.  Drink plenty of  fluids but you should avoid alcoholic beverages for 24 hours.  ACTIVITY:  You should plan to take it easy for the rest of today and you should NOT DRIVE or use heavy machinery until tomorrow (because of the sedation medicines used during the test).    FOLLOW UP: Our staff will call the number listed on your records the next business day following your procedure.  We will call around 7:15- 8:00 am to check on you and address any questions or concerns that you may have regarding the information given to you following your procedure. If we do not reach you, we will leave a message.     If any biopsies were taken you will be contacted by phone or by letter within the next 1-3 weeks.  Please call us  at [phone] if you have not heard about the biopsies in 3 weeks.    SIGNATURES/CONFIDENTIALITY: You and/or your care partner have signed paperwork which will be entered into your electronic medical record.  These signatures attest to the fact that that the information above on your After Visit Summary has been reviewed and is understood.  Full responsibility of the confidentiality of this discharge information lies with you and/or your care-partner.

## 2024-04-07 ENCOUNTER — Telehealth: Payer: Self-pay

## 2024-04-07 NOTE — Telephone Encounter (Signed)
  Follow up Call-     04/06/2024    8:08 AM  Call back number  Post procedure Call Back phone  # [phone]  Permission to leave phone message Yes     Patient questions:  Do you have a fever, pain , or abdominal swelling? No. Pain Score  0 *  Have you tolerated food without any problems? Yes.    Have you been able to return to your normal activities? Yes.    Do you have any questions about your discharge instructions: Diet   No. Medications  No. Follow up visit  No.  Do you have questions or concerns about your Care? No.  Actions: * If pain score is 4 or above: No action needed, pain <4.

## 2024-04-12 ENCOUNTER — Encounter: Payer: Self-pay | Admitting: Obstetrics and Gynecology

## 2024-04-12 ENCOUNTER — Ambulatory Visit (INDEPENDENT_AMBULATORY_CARE_PROVIDER_SITE_OTHER): Admitting: Obstetrics and Gynecology

## 2024-04-12 VITALS — BP 124/82 | HR 65 | Ht 63.0 in | Wt 170.0 lb

## 2024-04-12 DIAGNOSIS — Z01818 Encounter for other preprocedural examination: Secondary | ICD-10-CM

## 2024-04-12 DIAGNOSIS — N840 Polyp of corpus uteri: Secondary | ICD-10-CM | POA: Diagnosis not present

## 2024-04-12 DIAGNOSIS — R102 Pelvic and perineal pain: Secondary | ICD-10-CM | POA: Diagnosis not present

## 2024-04-12 MED ORDER — METOCLOPRAMIDE HCL 10 MG PO TABS: 10.0000 mg | ORAL_TABLET | Freq: Three times a day (TID) | ORAL | 0 refills | Status: AC | PRN

## 2024-04-12 MED ORDER — IBUPROFEN 800 MG PO TABS: 800.0000 mg | ORAL_TABLET | Freq: Three times a day (TID) | ORAL | 1 refills | Status: AC | PRN

## 2024-04-12 NOTE — Patient Instructions (Signed)
 Post-procedure Instructions after myosure D&C, ECC Cramping is common.  You may take Ibuprofen , Aleve, or Tylenol  for the cramping.  This should resolve within the next two to three days.   You may have bright red spotting or blackish discharge for several days after your procedure.  The discharge occurs because of a topical solution used to stop bleeding at the biopsy site(s).  The dark discharge will lighten and then turn clear before completely resolving.  You will need to wear a mini pad during this time. SABRA Refrain from putting anything in the vagina until the bleeding and/or discharge COMPLETLEY stops (usually two to three weeks). You need to call the office if you have any pelvic pain, fever, heavy bleeding, dysuria, foul smelling vaginal discharge, or if you are concerned. Showers only for 2 weeks You will be notified within one week of your biopsy results or we will discuss your results at your follow-up appointment if needed.

## 2024-04-12 NOTE — H&P (View-Only) (Signed)
 PREOP H&P myosure D&C, ECC  Subjective:    Patient ID: Maria Carter, female    DOB: Apr 06, 1971, 53 y.o.   MRN: [id number]   HPI 53 y.o. presents today for Pre-op Exam (hysteroscopy myosure D&C surgery - no other concerns) .  Patient with cramping but not heavy bleeding Has GI colonoscopy scheduled in August and does have some constipation Reports thyroid  medication has been adjusted recently as well  PUS with  9.13cm uterus 12.47 mm thickened with vascularity seen ?polyp Fibroid 1.10 cm  Both ovaries are normal No free fluid    Breast cancer diagnosed last year. Sister passed of breast cancer with mets to brain Pelvic discharge: + and would like nu swab collected  Last mammogram: Breast canc estrogen+ sp lumpectomy with chemo and radiation stopped tamoxifen  3/25 last MMG Reports mood swings and hot flashes To ask oncologist about effexor and veozzah Stopped periods Last colonoscopy: states she has first one in 6/25 Dxa: baseline ordered today PUS referral placed for anemia Not sexually active  No LMP recorded (lmp unknown). Patient is postmenopausal.    Review of Systems     Objective:    OBGyn Exam  BP 124/82 (BP Location: Right Arm, Patient Position: Sitting, Cuff Size: Normal)   Pulse 65   Ht 5' 3 (1.6 m)   Wt 170 lb (77.1 kg)   LMP  (LMP Unknown)   SpO2 99%   BMI 30.11 kg/m  Wt Readings from Last 3 Encounters:  04/12/24 170 lb (77.1 kg)  04/06/24 170 lb (77.1 kg)  03/28/24 170 lb (77.1 kg)          Component Value Date/Time   DIAGPAP  01/20/2024 0932    - Negative for Intraepithelial Lesions or Malignancy (NILM)   DIAGPAP - Benign reactive/reparative changes 01/20/2024 0932   HPVHIGH Negative 01/20/2024 0932   ADEQPAP  01/20/2024 0932    Satisfactory for evaluation; transformation zone component PRESENT.    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 repeat in 2 years   OB History     Gravida  6   Para  5   Term  4   Preterm  1   AB  1   Living  4      SAB  0   IAB  1   Ectopic      Multiple      Live Births  4          Past Surgical History:  Procedure Laterality Date   BREAST BIOPSY Right 09/30/2022   US  RT BREAST BX W LOC DEV 1ST LESION IMG BX SPEC US  GUIDE 09/30/2022 [facility]   BREAST BIOPSY  05/20/2023   MM RT RADIOACTIVE SEED LOC MAMMO GUIDE 05/20/2023 [facility]   BREAST LUMPECTOMY Right 05/21/2023   BREAST LUMPECTOMY WITH RADIOACTIVE SEED AND SENTINEL LYMPH NODE BIOPSY Right 05/21/2023   Procedure: RIGHT BREAST LUMPECTOMY WITH RADIOACTIVE SEED AND SENTINEL LYMPH NODE BIOPSY;  Surgeon: [name], MD;  Location: [facility];  Service: General;  Laterality: Right;  LMA PEC BLOCK  EXCISION OF KELOID N/A 05/21/2023   Procedure: EXCISION CHEST WALL SCAR;  Surgeon: [name], MD;  Location: [facility];  Service: General;  Laterality: N/A;   PORTACATH PLACEMENT N/A 10/23/2022   Procedure: INSERTION PORT-A-CATH;  Surgeon: [name], MD;  Location: [facility];  Service: General;  Laterality: N/A;   Social History   Socioeconomic History   Marital status: Single    Spouse name: Not on file   Number of children: Not on file   Years of education: Not on file   Highest education level: GED or equivalent  Occupational History   Not on file  Tobacco Use   Smoking status: Never   Smokeless tobacco: Never  Vaping Use   Vaping status: Never Used  Substance and Sexual Activity   Alcohol use: Not Currently   Drug use: No   Sexual activity: Not Currently    Partners:  Male    Birth control/protection: Post-menopausal, Abstinence  Other Topics Concern   Not on file  Social History Narrative   Not on file   Social Drivers of Health   Financial Resource Strain: Medium Risk (03/12/2024)   Overall Financial Resource Strain (CARDIA)    Difficulty of Paying Living Expenses: Somewhat hard  Food Insecurity: No Food Insecurity (03/12/2024)   Hunger Vital Sign    Worried About Running Out of Food in the Last Year: Never true    Ran Out of Food in the Last Year: Never true  Recent Concern: Food Insecurity - Food Insecurity Present (12/18/2023)   Hunger Vital Sign    Worried About Running Out of Food in the Last Year: Sometimes true    Ran Out of Food in the Last Year: Sometimes true  Transportation Needs: Unmet Transportation Needs (03/12/2024)   PRAPARE - Transportation    Lack of Transportation (Medical): Yes    Lack of Transportation (Non-Medical): Yes  Physical Activity: Inactive (03/12/2024)   Exercise Vital Sign    Days of Exercise per Week: 0 days    Minutes of Exercise per Session: Not on file  Stress: Stress Concern Present (03/12/2024)   [employer] of Occupational Health - Occupational Stress Questionnaire    Feeling of Stress: Very much  Social Connections: Moderately Isolated (03/12/2024)   Social Connection and Isolation Panel    Frequency of Communication with Friends and Family: Three times a week    Frequency of Social Gatherings with Friends and Family: Never    Attends Religious Services: Never    Database administrator or Organizations: Yes    Attends Banker Meetings: Never    Marital Status: Never married   Current Outpatient Medications on File Prior to Visit  Medication Sig Dispense Refill   ALPRAZolam (XANAX) 0.25 MG tablet Take 1 tab  daily  prn anxiety     busPIRone (BUSPAR) 15 MG tablet Take by mouth.     desvenlafaxine (PRISTIQ) 50 MG 24 hr tablet Take 50 mg by mouth daily.     ergocalciferol  (VITAMIN D2)  1.25 MG (50000 UT) capsule Take 1 capsule (50,000 Units total) by mouth once a week. 12 capsule 1   levothyroxine  (SYNTHROID ) 25 MCG tablet Take 25 mcg by mouth daily before breakfast. Take 2 tablets once daily     Multiple Vitamins-Minerals (MULTIVITAMIN ADULTS PO) Take 1 tablet by mouth daily. (Patient not taking: Reported on 04/12/2024)     tamoxifen  (NOLVADEX ) 20 MG tablet Take 1 tablet (20 mg total) by mouth daily. (  Patient not taking: Reported on 04/12/2024) 90 tablet 1   No current facility-administered medications on file prior to visit.   No Known Allergies   S1S2 CTA B/L Soft, NT   Assessment & Plan:  Breast cancer Abdominal cramping Endometrial polyp on PUS today  US  with possible polyp. Discussed polyps can cause bleeding and have associated risk for cancer <1% risk. Discussed she may have cramping from stenosed cervix and entrapped blood caused by the polyp, but GI causes need to be excluded as well.  Discussed best way to remove these is with the hysteroscopy myosure D&C. This is done with anesthesia in the operating room. The procedure was reviewed in detail and what to expect. She called her boyfriend Josefa and he was apart of the conversation and was allowed to ask questions as well.  The hysteroscopy with the [facility], ECC was reviewed and discussed in detail with the patient.  The risk of bleeding, infection, injury to surrounding structures, uterine perforation with risk of additional procedure laparoscopy/laparotomy were all reviewed discussed with patient.  Risk for blood clots was discussed.  Discussed risk for bleeding after the procedure anywhere from 7 to 10 days.  She will need a driver to and from surgery as well.  Pathology results will take about 2 weeks to return.  20 minutes spent on reviewing records, imaging,  and one on one patient time and counseling patient and documentation Dr. Glennon Almarie MARLA Glennon

## 2024-04-12 NOTE — Progress Notes (Signed)
 PREOP H&P myosure D&C, ECC  Subjective:    Patient ID: Maria Carter, female    DOB: 1971-06-30, 53 y.o.   MRN: [id number]   HPI 53 y.o. presents today for Pre-op Exam (hysteroscopy myosure D&C surgery - no other concerns) .  Patient with cramping but not heavy bleeding Has GI colonoscopy scheduled in August and does have some constipation Reports thyroid  medication has been adjusted recently as well  PUS with  9.13cm uterus 12.47 mm thickened with vascularity seen ?polyp Fibroid 1.10 cm  Both ovaries are normal No free fluid    Breast cancer diagnosed last year. Sister passed of breast cancer with mets to brain Pelvic discharge: + and would like nu swab collected  Last mammogram: Breast canc estrogen+ sp lumpectomy with chemo and radiation stopped tamoxifen  3/25 last MMG Reports mood swings and hot flashes To ask oncologist about effexor and veozzah Stopped periods Last colonoscopy: states she has first one in 6/25 Dxa: baseline ordered today PUS referral placed for anemia Not sexually active  No LMP recorded (lmp unknown). Patient is postmenopausal.    Review of Systems     Objective:    OBGyn Exam  BP 124/82 (BP Location: Right Arm, Patient Position: Sitting, Cuff Size: Normal)   Pulse 65   Ht 5' 3 (1.6 m)   Wt 170 lb (77.1 kg)   LMP  (LMP Unknown)   SpO2 99%   BMI 30.11 kg/m  Wt Readings from Last 3 Encounters:  04/12/24 170 lb (77.1 kg)  04/06/24 170 lb (77.1 kg)  03/28/24 170 lb (77.1 kg)          Component Value Date/Time   DIAGPAP  01/20/2024 0932    - Negative for Intraepithelial Lesions or Malignancy (NILM)   DIAGPAP - Benign reactive/reparative changes 01/20/2024 0932   HPVHIGH Negative 01/20/2024 0932   ADEQPAP  01/20/2024 0932    Satisfactory for evaluation; transformation zone component PRESENT.    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 repeat in 2 years   OB History     Gravida  6   Para  5   Term  4   Preterm  1   AB  1   Living  4      SAB  0   IAB  1   Ectopic      Multiple      Live Births  4          Past Surgical History:  Procedure Laterality Date   BREAST BIOPSY Right 09/30/2022   US  RT BREAST BX W LOC DEV 1ST LESION IMG BX SPEC US  GUIDE 09/30/2022 [facility]   BREAST BIOPSY  05/20/2023   MM RT RADIOACTIVE SEED LOC MAMMO GUIDE 05/20/2023 [facility]   BREAST LUMPECTOMY Right 05/21/2023   BREAST LUMPECTOMY WITH RADIOACTIVE SEED AND SENTINEL LYMPH NODE BIOPSY Right 05/21/2023   Procedure: RIGHT BREAST LUMPECTOMY WITH RADIOACTIVE SEED AND SENTINEL LYMPH NODE BIOPSY;  Surgeon: [name], MD;  Location: [facility];  Service: General;  Laterality: Right;  LMA PEC BLOCK  EXCISION OF KELOID N/A 05/21/2023   Procedure: EXCISION CHEST WALL SCAR;  Surgeon: [name], MD;  Location: [facility];  Service: General;  Laterality: N/A;   PORTACATH PLACEMENT N/A 10/23/2022   Procedure: INSERTION PORT-A-CATH;  Surgeon: [name], MD;  Location: [facility];  Service: General;  Laterality: N/A;   Social History   Socioeconomic History   Marital status: Single    Spouse name: Not on file   Number of children: Not on file   Years of education: Not on file   Highest education level: GED or equivalent  Occupational History   Not on file  Tobacco Use   Smoking status: Never   Smokeless tobacco: Never  Vaping Use   Vaping status: Never Used  Substance and Sexual Activity   Alcohol use: Not Currently   Drug use: No   Sexual activity: Not Currently    Partners:  Male    Birth control/protection: Post-menopausal, Abstinence  Other Topics Concern   Not on file  Social History Narrative   Not on file   Social Drivers of Health   Financial Resource Strain: Medium Risk (03/12/2024)   Overall Financial Resource Strain (CARDIA)    Difficulty of Paying Living Expenses: Somewhat hard  Food Insecurity: No Food Insecurity (03/12/2024)   Hunger Vital Sign    Worried About Running Out of Food in the Last Year: Never true    Ran Out of Food in the Last Year: Never true  Recent Concern: Food Insecurity - Food Insecurity Present (12/18/2023)   Hunger Vital Sign    Worried About Running Out of Food in the Last Year: Sometimes true    Ran Out of Food in the Last Year: Sometimes true  Transportation Needs: Unmet Transportation Needs (03/12/2024)   PRAPARE - Transportation    Lack of Transportation (Medical): Yes    Lack of Transportation (Non-Medical): Yes  Physical Activity: Inactive (03/12/2024)   Exercise Vital Sign    Days of Exercise per Week: 0 days    Minutes of Exercise per Session: Not on file  Stress: Stress Concern Present (03/12/2024)   [employer] of Occupational Health - Occupational Stress Questionnaire    Feeling of Stress: Very much  Social Connections: Moderately Isolated (03/12/2024)   Social Connection and Isolation Panel    Frequency of Communication with Friends and Family: Three times a week    Frequency of Social Gatherings with Friends and Family: Never    Attends Religious Services: Never    Database administrator or Organizations: Yes    Attends Banker Meetings: Never    Marital Status: Never married   Current Outpatient Medications on File Prior to Visit  Medication Sig Dispense Refill   ALPRAZolam (XANAX) 0.25 MG tablet Take 1 tab  daily  prn anxiety     busPIRone (BUSPAR) 15 MG tablet Take by mouth.     desvenlafaxine (PRISTIQ) 50 MG 24 hr tablet Take 50 mg by mouth daily.     ergocalciferol  (VITAMIN D2)  1.25 MG (50000 UT) capsule Take 1 capsule (50,000 Units total) by mouth once a week. 12 capsule 1   levothyroxine  (SYNTHROID ) 25 MCG tablet Take 25 mcg by mouth daily before breakfast. Take 2 tablets once daily     Multiple Vitamins-Minerals (MULTIVITAMIN ADULTS PO) Take 1 tablet by mouth daily. (Patient not taking: Reported on 04/12/2024)     tamoxifen  (NOLVADEX ) 20 MG tablet Take 1 tablet (20 mg total) by mouth daily. (  Patient not taking: Reported on 04/12/2024) 90 tablet 1   No current facility-administered medications on file prior to visit.   No Known Allergies   S1S2 CTA B/L Soft, NT   Assessment & Plan:  Breast cancer Abdominal cramping Endometrial polyp on PUS today  US  with possible polyp. Discussed polyps can cause bleeding and have associated risk for cancer <1% risk. Discussed she may have cramping from stenosed cervix and entrapped blood caused by the polyp, but GI causes need to be excluded as well.  Discussed best way to remove these is with the hysteroscopy myosure D&C. This is done with anesthesia in the operating room. The procedure was reviewed in detail and what to expect. She called her boyfriend Josefa and he was apart of the conversation and was allowed to ask questions as well.  The hysteroscopy with the [facility], ECC was reviewed and discussed in detail with the patient.  The risk of bleeding, infection, injury to surrounding structures, uterine perforation with risk of additional procedure laparoscopy/laparotomy were all reviewed discussed with patient.  Risk for blood clots was discussed.  Discussed risk for bleeding after the procedure anywhere from 7 to 10 days.  She will need a driver to and from surgery as well.  Pathology results will take about 2 weeks to return.  20 minutes spent on reviewing records, imaging,  and one on one patient time and counseling patient and documentation Dr. Glennon Almarie MARLA Glennon

## 2024-04-14 ENCOUNTER — Ambulatory Visit (HOSPITAL_COMMUNITY): Payer: Self-pay | Admitting: Anesthesiology

## 2024-04-14 ENCOUNTER — Ambulatory Visit (HOSPITAL_BASED_OUTPATIENT_CLINIC_OR_DEPARTMENT_OTHER): Payer: Self-pay | Admitting: Anesthesiology

## 2024-04-14 ENCOUNTER — Encounter (HOSPITAL_COMMUNITY)
Admission: RE | Disposition: A | Discharge status: Home / Self Care | Payer: Self-pay | Source: Home / Self Care | Attending: Obstetrics and Gynecology

## 2024-04-14 ENCOUNTER — Other Ambulatory Visit: Payer: Self-pay

## 2024-04-14 ENCOUNTER — Ambulatory Visit (HOSPITAL_COMMUNITY)
Admission: RE | Admit: 2024-04-14 | Discharge: 2024-04-14 | Disposition: A | Discharge status: Home / Self Care | Attending: Obstetrics and Gynecology | Admitting: Obstetrics and Gynecology

## 2024-04-14 ENCOUNTER — Encounter (HOSPITAL_COMMUNITY): Payer: Self-pay | Admitting: Obstetrics and Gynecology

## 2024-04-14 DIAGNOSIS — Z5982 Transportation insecurity: Secondary | ICD-10-CM | POA: Insufficient documentation

## 2024-04-14 DIAGNOSIS — M8580 Other specified disorders of bone density and structure, unspecified site: Secondary | ICD-10-CM | POA: Insufficient documentation

## 2024-04-14 DIAGNOSIS — Z853 Personal history of malignant neoplasm of breast: Secondary | ICD-10-CM | POA: Insufficient documentation

## 2024-04-14 DIAGNOSIS — F4310 Post-traumatic stress disorder, unspecified: Secondary | ICD-10-CM | POA: Diagnosis not present

## 2024-04-14 DIAGNOSIS — Z803 Family history of malignant neoplasm of breast: Secondary | ICD-10-CM | POA: Insufficient documentation

## 2024-04-14 DIAGNOSIS — N840 Polyp of corpus uteri: Secondary | ICD-10-CM | POA: Diagnosis not present

## 2024-04-14 DIAGNOSIS — F418 Other specified anxiety disorders: Secondary | ICD-10-CM | POA: Insufficient documentation

## 2024-04-14 DIAGNOSIS — Z9889 Other specified postprocedural states: Secondary | ICD-10-CM

## 2024-04-14 DIAGNOSIS — Z9221 Personal history of antineoplastic chemotherapy: Secondary | ICD-10-CM | POA: Insufficient documentation

## 2024-04-14 DIAGNOSIS — E039 Hypothyroidism, unspecified: Secondary | ICD-10-CM | POA: Insufficient documentation

## 2024-04-14 DIAGNOSIS — N950 Postmenopausal bleeding: Secondary | ICD-10-CM | POA: Diagnosis not present

## 2024-04-14 DIAGNOSIS — Z01818 Encounter for other preprocedural examination: Secondary | ICD-10-CM

## 2024-04-14 DIAGNOSIS — N858 Other specified noninflammatory disorders of uterus: Secondary | ICD-10-CM | POA: Diagnosis not present

## 2024-04-14 DIAGNOSIS — Z923 Personal history of irradiation: Secondary | ICD-10-CM | POA: Insufficient documentation

## 2024-04-14 DIAGNOSIS — R102 Pelvic and perineal pain: Secondary | ICD-10-CM

## 2024-04-14 DIAGNOSIS — Z5986 Financial insecurity: Secondary | ICD-10-CM | POA: Diagnosis not present

## 2024-04-14 HISTORY — PX: MYOSURE RESECTION: SHX7611

## 2024-04-14 HISTORY — PX: DILATATION & CURRETTAGE/HYSTEROSCOPY WITH RESECTOCOPE: SHX5572

## 2024-04-14 LAB — CBC
HCT: 34.8 % — ABNORMAL LOW (ref 36.0–46.0)
Hemoglobin: 11.2 g/dL — ABNORMAL LOW (ref 12.0–15.0)
MCH: 28.8 pg (ref 26.0–34.0)
MCHC: 32.2 g/dL (ref 30.0–36.0)
MCV: 89.5 fL (ref 80.0–100.0)
Platelets: 147 K/uL — ABNORMAL LOW (ref 150–400)
RBC: 3.89 MIL/uL (ref 3.87–5.11)
RDW: 12.9 % (ref 11.5–15.5)
WBC: 2.1 K/uL — ABNORMAL LOW (ref 4.0–10.5)
nRBC: 0 % (ref 0.0–0.2)

## 2024-04-14 SURGERY — DILATATION & CURETTAGE/HYSTEROSCOPY WITH RESECTOCOPE
Anesthesia: General

## 2024-04-14 MED ORDER — PROPOFOL 10 MG/ML IV BOLUS
INTRAVENOUS | Status: DC | PRN
  Administered 2024-04-14: 160 mg via INTRAVENOUS

## 2024-04-14 MED ORDER — KETOROLAC TROMETHAMINE 30 MG/ML IJ SOLN
INTRAMUSCULAR | Status: DC | PRN
Start: 2024-04-14 — End: 2024-04-14
  Administered 2024-04-14: 30 mg via INTRAVENOUS

## 2024-04-14 MED ORDER — POVIDONE-IODINE 10 % EX SWAB: 2.0000 | Freq: Once | CUTANEOUS | Status: DC

## 2024-04-14 MED ORDER — CHLORHEXIDINE GLUCONATE 0.12 % MT SOLN
15.0000 mL | Freq: Once | OROMUCOSAL | Status: AC
  Administered 2024-04-14: 15 mL via OROMUCOSAL

## 2024-04-14 MED ORDER — MIDAZOLAM HCL 2 MG/2ML IJ SOLN
INTRAMUSCULAR | Status: AC
  Filled 2024-04-14: qty 2

## 2024-04-14 MED ORDER — FENTANYL CITRATE (PF) 100 MCG/2ML IJ SOLN
25.0000 ug | INTRAMUSCULAR | Status: DC | PRN
  Administered 2024-04-14: 50 ug via INTRAVENOUS
  Administered 2024-04-14 (×2): 25 ug via INTRAVENOUS

## 2024-04-14 MED ORDER — PHENYLEPHRINE 80 MCG/ML (10ML) SYRINGE FOR IV PUSH (FOR BLOOD PRESSURE SUPPORT)
PREFILLED_SYRINGE | INTRAVENOUS | Status: AC
  Filled 2024-04-14: qty 10

## 2024-04-14 MED ORDER — FENTANYL CITRATE (PF) 250 MCG/5ML IJ SOLN
INTRAMUSCULAR | Status: AC
  Filled 2024-04-14: qty 5

## 2024-04-14 MED ORDER — LIDOCAINE 2% (20 MG/ML) 5 ML SYRINGE
INTRAMUSCULAR | Status: AC
  Filled 2024-04-14: qty 5

## 2024-04-14 MED ORDER — ONDANSETRON HCL 4 MG/2ML IJ SOLN
INTRAMUSCULAR | Status: DC | PRN
  Administered 2024-04-14: 4 mg via INTRAVENOUS

## 2024-04-14 MED ORDER — PROPOFOL 10 MG/ML IV BOLUS
INTRAVENOUS | Status: AC
  Filled 2024-04-14: qty 20

## 2024-04-14 MED ORDER — LACTATED RINGERS IV SOLN: INTRAVENOUS | Status: DC

## 2024-04-14 MED ORDER — KETOROLAC TROMETHAMINE 30 MG/ML IJ SOLN
INTRAMUSCULAR | Status: AC
  Filled 2024-04-14: qty 1

## 2024-04-14 MED ORDER — MIDAZOLAM HCL 2 MG/2ML IJ SOLN
INTRAMUSCULAR | Status: DC | PRN
  Administered 2024-04-14: 2 mg via INTRAVENOUS

## 2024-04-14 MED ORDER — FENTANYL CITRATE (PF) 100 MCG/2ML IJ SOLN
INTRAMUSCULAR | Status: AC
  Filled 2024-04-14: qty 2

## 2024-04-14 MED ORDER — SODIUM CHLORIDE 0.9 % IR SOLN
Status: DC | PRN
Start: 2024-04-14 — End: 2024-04-14
  Administered 2024-04-14: 3000 mL

## 2024-04-14 MED ORDER — CHLORHEXIDINE GLUCONATE 0.12 % MT SOLN
OROMUCOSAL | Status: AC
  Filled 2024-04-14: qty 15

## 2024-04-14 MED ORDER — LIDOCAINE 2% (20 MG/ML) 5 ML SYRINGE
INTRAMUSCULAR | Status: DC | PRN
  Administered 2024-04-14: 100 mg via INTRAVENOUS

## 2024-04-14 MED ORDER — DEXAMETHASONE SODIUM PHOSPHATE 10 MG/ML IJ SOLN
INTRAMUSCULAR | Status: DC | PRN
Start: 2024-04-14 — End: 2024-04-14
  Administered 2024-04-14: 10 mg via INTRAVENOUS

## 2024-04-14 MED ORDER — SODIUM CHLORIDE (PF) 0.9 % IJ SOLN
INTRAMUSCULAR | Status: AC
  Filled 2024-04-14: qty 10

## 2024-04-14 MED ORDER — FENTANYL CITRATE (PF) 250 MCG/5ML IJ SOLN
INTRAMUSCULAR | Status: DC | PRN
  Administered 2024-04-14: 100 ug via INTRAVENOUS

## 2024-04-14 MED ORDER — OXYCODONE HCL 5 MG PO TABS
5.0000 mg | ORAL_TABLET | Freq: Once | ORAL | Status: AC | PRN
  Administered 2024-04-14: 5 mg via ORAL

## 2024-04-14 MED ORDER — ONDANSETRON HCL 4 MG/2ML IJ SOLN
INTRAMUSCULAR | Status: AC
  Filled 2024-04-14: qty 2

## 2024-04-14 MED ORDER — MONSELS FERRIC SUBSULFATE EX SOLN
CUTANEOUS | Status: DC | PRN
  Administered 2024-04-14: 1 via TOPICAL

## 2024-04-14 MED ORDER — DEXAMETHASONE SODIUM PHOSPHATE 10 MG/ML IJ SOLN
INTRAMUSCULAR | Status: AC
  Filled 2024-04-14: qty 1

## 2024-04-14 MED ORDER — EPHEDRINE SULFATE-NACL 50-0.9 MG/10ML-% IV SOSY
PREFILLED_SYRINGE | INTRAVENOUS | Status: DC | PRN
Start: 2024-04-14 — End: 2024-04-14
  Administered 2024-04-14: 5 mg via INTRAVENOUS

## 2024-04-14 MED ORDER — ACETAMINOPHEN 500 MG PO TABS
1000.0000 mg | ORAL_TABLET | ORAL | Status: AC
  Administered 2024-04-14: 1000 mg via ORAL

## 2024-04-14 MED ORDER — ACETAMINOPHEN 500 MG PO TABS
ORAL_TABLET | ORAL | Status: AC
  Filled 2024-04-14: qty 2

## 2024-04-14 MED ORDER — OXYCODONE HCL 5 MG/5ML PO SOLN: 5.0000 mg | Freq: Once | ORAL | Status: AC | PRN

## 2024-04-14 MED ORDER — MONSELS FERRIC SUBSULFATE EX SOLN
CUTANEOUS | Status: AC
  Filled 2024-04-14: qty 8

## 2024-04-14 MED ORDER — AMISULPRIDE (ANTIEMETIC) 5 MG/2ML IV SOLN: 10.0000 mg | Freq: Once | INTRAVENOUS | Status: DC | PRN

## 2024-04-14 MED ORDER — OXYCODONE HCL 5 MG PO TABS
ORAL_TABLET | ORAL | Status: AC
  Filled 2024-04-14: qty 1

## 2024-04-14 MED ORDER — ORAL CARE MOUTH RINSE: 15.0000 mL | Freq: Once | OROMUCOSAL | Status: AC

## 2024-04-14 SURGICAL SUPPLY — 16 items
CATH ROBINSON RED A/P 16FR (CATHETERS) IMPLANT
DEVICE MYOSURE LITE (MISCELLANEOUS) IMPLANT
DEVICE MYOSURE REACH (MISCELLANEOUS) IMPLANT
DILATOR CANAL MILEX (MISCELLANEOUS) IMPLANT
GLOVE NEODERM STER SZ 7 (GLOVE) ×2 IMPLANT
GLOVE SURG UNDER POLY LF SZ7 (GLOVE) ×2 IMPLANT
GOWN STRL REUS W/ TWL XL LVL3 (GOWN DISPOSABLE) ×2 IMPLANT
KIT PROCEDURE FLUENT (KITS) ×2 IMPLANT
KIT TURNOVER KIT B (KITS) ×2 IMPLANT
PACK VAGINAL MINOR WOMEN LF (CUSTOM PROCEDURE TRAY) ×2 IMPLANT
PAD OB MATERNITY 11 LF (PERSONAL CARE ITEMS) ×2 IMPLANT
SCOPETTES 8 STERILE (MISCELLANEOUS) ×2 IMPLANT
SEAL ROD LENS SCOPE MYOSURE (ABLATOR) ×2 IMPLANT
SOL .9 NS 3000ML IRR UROMATIC (IV SOLUTION) IMPLANT
TOWEL GREEN STERILE FF (TOWEL DISPOSABLE) ×2 IMPLANT
UNDERPAD 30X36 HEAVY ABSORB (UNDERPADS AND DIAPERS) ×2 IMPLANT

## 2024-04-14 NOTE — Anesthesia Postprocedure Evaluation (Signed)
 Anesthesia Post Note  Patient: Maria Carter  Procedure(s) Performed: DILATATION & CURETTAGE/HYSTEROSCOPY MYOSURE RESECTION     Patient location during evaluation: PACU Anesthesia Type: General Level of consciousness: awake Pain management: pain level controlled Vital Signs Assessment: post-procedure vital signs reviewed and stable Respiratory status: spontaneous breathing, nonlabored ventilation and respiratory function stable Cardiovascular status: blood pressure returned to baseline and stable Postop Assessment: no apparent nausea or vomiting Anesthetic complications: no   No notable events documented.  Last Vitals:  Vitals:   04/14/24 1500 04/14/24 1515  BP: 117/69 117/79  Pulse: 66 (!) 58  Resp: 16 14  Temp:  36.5 C  SpO2: 94% 98%    Last Pain:  Vitals:   04/14/24 1515  TempSrc:   PainSc: 3                  Delon Aisha Arch

## 2024-04-14 NOTE — Anesthesia Procedure Notes (Signed)
 Procedure Name: LMA Insertion Date/Time: 04/14/2024 1:47 PM  Performed by: Viviana Almarie DASEN, CRNAPre-anesthesia Checklist: Patient identified, Emergency Drugs available, Suction available and Patient being monitored Patient Re-evaluated:Patient Re-evaluated prior to induction Oxygen Delivery Method: Circle System Utilized Preoxygenation: Pre-oxygenation with 100% oxygen Induction Type: IV induction Ventilation: Mask ventilation without difficulty LMA: LMA inserted LMA Size: 4.0 Tube type: Oral Number of attempts: 1 Airway Equipment and Method: Bite block Placement Confirmation: positive ETCO2 Tube secured with: Tape Dental Injury: Teeth and Oropharynx as per pre-operative assessment

## 2024-04-14 NOTE — Transfer of Care (Signed)
 Immediate Anesthesia Transfer of Care Note  Patient: Maria Carter  Procedure(s) Performed: DILATATION & CURETTAGE/HYSTEROSCOPY MYOSURE RESECTION  Patient Location: PACU  Anesthesia Type:General  Level of Consciousness: awake, alert , oriented, and patient cooperative  Airway & Oxygen Therapy: Patient Spontanous Breathing  Post-op Assessment: Report given to RN, Post -op Vital signs reviewed and stable, Patient moving all extremities X 4, and Patient able to stick tongue midline  Post vital signs: Reviewed and stable  Last Vitals:  Vitals Value Taken Time  BP 127/60   Temp 97.9   Pulse 76 04/14/24 14:22  Resp 16 04/14/24 14:22  SpO2 100 % 04/14/24 14:22  Vitals shown include unfiled device data.  Last Pain:  Vitals:   04/14/24 1204  TempSrc: Oral  PainSc: 0-No pain         Complications: No notable events documented.

## 2024-04-14 NOTE — Interval H&P Note (Signed)
 History and Physical Interval Note:  04/14/2024 12:31 PM  Maria Carter  has presented today for surgery, with the diagnosis of pelvic crampin, endometrial polyp.  The various methods of treatment have been discussed with the patient and family. After consideration of risks, benefits and other options for treatment, the patient has consented to  Procedure(s) with comments: DILATATION & CURETTAGE/HYSTEROSCOPY (N/A) - Myosure and ECC as a surgical intervention.  The patient's history has been reviewed, patient examined, no change in status, stable for surgery.  I have reviewed the patient's chart and labs.  Questions were answered to the patient's satisfaction.     Maria Carter

## 2024-04-14 NOTE — Anesthesia Preprocedure Evaluation (Addendum)
 Anesthesia Evaluation  Patient identified by MRN, date of birth, ID band Patient awake    Reviewed: Allergy & Precautions, NPO status , Patient's Chart, lab work & pertinent test results  History of Anesthesia Complications Negative for: history of anesthetic complications  Airway Mallampati: III  TM Distance: >3 FB Neck ROM: Full    Dental  (+) Dental Advisory Given, Poor Dentition   Pulmonary neg pulmonary ROS   Pulmonary exam normal breath sounds clear to auscultation       Cardiovascular negative cardio ROS  Rhythm:Regular Rate:Normal     Neuro/Psych  Headaches, neg Seizures PSYCHIATRIC DISORDERS (ADD, PTSD) Anxiety Depression       GI/Hepatic negative GI ROS, Neg liver ROS,,,  Endo/Other  neg diabetesHypothyroidism    Renal/GU negative Renal ROS     Musculoskeletal Osteopenia    Abdominal   Peds  Hematology  (+) Blood dyscrasia, anemia Lab Results      Component                Value               Date                      WBC                      2.3 (L)             03/18/2024                HGB                      11.4 (L)            03/18/2024                HCT                      34.9 (L)            03/18/2024                MCV                      88.5                03/18/2024                PLT                      149.0 (L)           03/18/2024              Anesthesia Other Findings   Reproductive/Obstetrics                              Anesthesia Physical Anesthesia Plan  ASA: 2  Anesthesia Plan: General   Post-op Pain Management: Tylenol  PO (pre-op)*   Induction: Intravenous  PONV Risk Score and Plan: 3 and Ondansetron , Dexamethasone  and Treatment may vary due to age or medical condition  Airway Management Planned: LMA  Additional Equipment:   Intra-op Plan:   Post-operative Plan: Extubation in OR  Informed Consent: I have reviewed the patients History  and Physical, chart, labs and discussed the procedure including the risks, benefits and alternatives for the proposed anesthesia with the patient or  authorized representative who has indicated his/her understanding and acceptance.     Dental advisory given  Plan Discussed with: CRNA and Anesthesiologist  Anesthesia Plan Comments: (Risks of general anesthesia discussed including, but not limited to, sore throat, hoarse voice, chipped/damaged teeth, injury to vocal cords, nausea and vomiting, allergic reactions, lung infection, heart attack, stroke, and death. All questions answered. )         Anesthesia Quick Evaluation

## 2024-04-14 NOTE — Discharge Instructions (Signed)

## 2024-04-15 ENCOUNTER — Encounter (HOSPITAL_COMMUNITY): Payer: Self-pay | Admitting: Obstetrics and Gynecology

## 2024-04-17 NOTE — Op Note (Signed)
 04/17/2024 Maria Carter [id number]    OPERATIVE REPORT  Preop Diagnosis: post menopausal bleeding, thickened endometrial lining  Post operative diagnosis: same  Procedure: Myosure hysteroscopy Dilation and curettage, endocervical curettage  Surgeon: Dr. Almarie Rollo Carpen Assistant: none  Fluids: please see anesthesia report Fluid deficit: 130cc  Complications: None Anesthesia: MAC   Findings: Small appearing cervix and uterine cavity measuring 7cm with both ostia seen.  A large endometrial polyp was seen and removed.  No submucosal fibroids were seen.  Estimated blood loss: Minimal  Specimens: Endometrial curettings with endometrial polyp, ECC  Disposition of specimen: Pathology    Procedure: Patient was taken to the OR where she was placed in dorsal lithotomy in Allen stirrups. SCDs were in place.  The patient was prepped and draped in the usual sterile fashion. An adequate timeout was obtained and everyone agreed. A red rubber catheter is used to drain her bladder. A bivalve speculum was placed inside the vagina and the cervix visualized. The cervix was grasped anteriorly with a single-tooth tenaculum.  The uterus sounded to 7 cm. Sequential dilation was done to a #8 dilator, and the myosure hysteroscope was introduced into the uterine cavity. The cervix and endometrial lining appeared normal both ostia were seen there was no deformity of the cavity. A large polyp was seen and removed with the myosure reach.  This was also used to sample the entire cavity.  A sharp curettage was then performed to ensure complete sampling of the cavity. and was sent to pathology. All instrument, sponge and lap counts were correct x2. The patient was awakened taken to recovery room in stable condition. Almarie MARLA Carpen MD 04/17/2024 5:55 PM

## 2024-04-18 ENCOUNTER — Ambulatory Visit: Payer: Self-pay | Admitting: Obstetrics and Gynecology

## 2024-04-18 LAB — SURGICAL PATHOLOGY

## 2024-04-19 ENCOUNTER — Encounter: Payer: Self-pay | Admitting: Hematology and Oncology

## 2024-04-19 ENCOUNTER — Telehealth: Payer: Self-pay

## 2024-04-19 NOTE — Telephone Encounter (Signed)
 Pt called to request refill on levothyroxine  25 mcg. Message routed to MD to advise if dose change is necessary. She has an appt to est care with Dr Dartha 9/29.

## 2024-04-21 ENCOUNTER — Encounter: Payer: Self-pay | Admitting: Hematology and Oncology

## 2024-04-21 ENCOUNTER — Other Ambulatory Visit: Payer: Self-pay

## 2024-04-21 MED ORDER — LEVOTHYROXINE SODIUM 25 MCG PO TABS
25.0000 ug | ORAL_TABLET | Freq: Every day | ORAL | 1 refills | Status: DC
Start: 2024-04-21 — End: 2024-04-27

## 2024-04-27 ENCOUNTER — Telehealth: Payer: Self-pay

## 2024-04-27 MED ORDER — LEVOTHYROXINE SODIUM 25 MCG PO TABS
50.0000 ug | ORAL_TABLET | Freq: Every day | ORAL | 1 refills | Status: DC
Start: 2024-04-27 — End: 2024-05-30

## 2024-04-27 NOTE — Telephone Encounter (Signed)
 Levothyroxine  sig updated.

## 2024-05-03 ENCOUNTER — Inpatient Hospital Stay

## 2024-05-03 ENCOUNTER — Ambulatory Visit (INDEPENDENT_AMBULATORY_CARE_PROVIDER_SITE_OTHER): Admitting: Obstetrics and Gynecology

## 2024-05-03 VITALS — BP 118/80 | HR 64 | Wt 175.2 lb

## 2024-05-03 DIAGNOSIS — Z09 Encounter for follow-up examination after completed treatment for conditions other than malignant neoplasm: Secondary | ICD-10-CM

## 2024-05-03 NOTE — Progress Notes (Signed)
 Patient presents for 2 week postop from [facility], ECC removal of endometrial polyp. She is doing well. No fevers, VB, dysuria or severe abdominal pain.  BP 118/80   Pulse 64   Wt 175 lb 3.2 oz (79.5 kg)   LMP  (LMP Unknown)   SpO2 99%   BMI 31.04 kg/m   Pathology: benign polyp   A/p PO 2 weeks doing well Resume activities Resume annual mammograms and exams Encouraged patient to return with any abnormal bleeding or concerns  Dr. Glennon

## 2024-05-11 ENCOUNTER — Telehealth: Payer: Self-pay

## 2024-05-11 NOTE — Telephone Encounter (Signed)
 Patient called in states that she has an upcoming appointment 05/24/24, but she is concerned that she will need a sooner appointment, as that date will be 9 wks since previous port flush. She denies any concerns at this time and we reviewed Symptoms of a blood clot for concern. She denies any of these symptoms and we advised that she would be OK to wait until her scheduled appointment 05/24/24 to have her port flushed. She is in agreement and knows to call with any further concerns in the interim.

## 2024-05-20 DIAGNOSIS — F330 Major depressive disorder, recurrent, mild: Secondary | ICD-10-CM | POA: Diagnosis not present

## 2024-05-20 DIAGNOSIS — F4310 Post-traumatic stress disorder, unspecified: Secondary | ICD-10-CM | POA: Diagnosis not present

## 2024-05-20 DIAGNOSIS — F411 Generalized anxiety disorder: Secondary | ICD-10-CM | POA: Diagnosis not present

## 2024-05-23 ENCOUNTER — Other Ambulatory Visit: Payer: Self-pay

## 2024-05-23 ENCOUNTER — Ambulatory Visit

## 2024-05-23 DIAGNOSIS — C50411 Malignant neoplasm of upper-outer quadrant of right female breast: Secondary | ICD-10-CM

## 2024-05-23 DIAGNOSIS — G620 Drug-induced polyneuropathy: Secondary | ICD-10-CM

## 2024-05-23 DIAGNOSIS — Z95828 Presence of other vascular implants and grafts: Secondary | ICD-10-CM

## 2024-05-23 DIAGNOSIS — D696 Thrombocytopenia, unspecified: Secondary | ICD-10-CM

## 2024-05-23 DIAGNOSIS — E559 Vitamin D deficiency, unspecified: Secondary | ICD-10-CM

## 2024-05-23 NOTE — Progress Notes (Signed)
 Pt called and states she prefers to have her guardant reveal drawn at [facility] when having her PAC flushed d/t being a hard peripheral stick. Requisition completed and order placed for Guardant reveal. Message sent to scheduling to set up time for pt in 6 mos from now.

## 2024-05-24 ENCOUNTER — Inpatient Hospital Stay (HOSPITAL_BASED_OUTPATIENT_CLINIC_OR_DEPARTMENT_OTHER): Admitting: Hematology and Oncology

## 2024-05-24 ENCOUNTER — Inpatient Hospital Stay: Attending: Physician Assistant

## 2024-05-24 VITALS — BP 122/59 | HR 73 | Temp 98.7°F | Resp 19 | Wt 176.0 lb

## 2024-05-24 DIAGNOSIS — C50411 Malignant neoplasm of upper-outer quadrant of right female breast: Secondary | ICD-10-CM | POA: Diagnosis not present

## 2024-05-24 DIAGNOSIS — Z1722 Progesterone receptor negative status: Secondary | ICD-10-CM | POA: Insufficient documentation

## 2024-05-24 DIAGNOSIS — Z79810 Long term (current) use of selective estrogen receptor modulators (SERMs): Secondary | ICD-10-CM | POA: Diagnosis not present

## 2024-05-24 DIAGNOSIS — Z170 Estrogen receptor positive status [ER+]: Secondary | ICD-10-CM | POA: Insufficient documentation

## 2024-05-24 DIAGNOSIS — Z95828 Presence of other vascular implants and grafts: Secondary | ICD-10-CM

## 2024-05-24 DIAGNOSIS — E039 Hypothyroidism, unspecified: Secondary | ICD-10-CM | POA: Insufficient documentation

## 2024-05-24 DIAGNOSIS — M8580 Other specified disorders of bone density and structure, unspecified site: Secondary | ICD-10-CM | POA: Diagnosis not present

## 2024-05-24 DIAGNOSIS — D696 Thrombocytopenia, unspecified: Secondary | ICD-10-CM

## 2024-05-24 DIAGNOSIS — Z1732 Human epidermal growth factor receptor 2 negative status: Secondary | ICD-10-CM | POA: Diagnosis not present

## 2024-05-24 DIAGNOSIS — G620 Drug-induced polyneuropathy: Secondary | ICD-10-CM

## 2024-05-24 DIAGNOSIS — E559 Vitamin D deficiency, unspecified: Secondary | ICD-10-CM

## 2024-05-24 DIAGNOSIS — D72819 Decreased white blood cell count, unspecified: Secondary | ICD-10-CM | POA: Insufficient documentation

## 2024-05-24 DIAGNOSIS — Z923 Personal history of irradiation: Secondary | ICD-10-CM | POA: Diagnosis not present

## 2024-05-24 LAB — CBC WITH DIFFERENTIAL (CANCER CENTER ONLY)
Abs Immature Granulocytes: 0.01 K/uL (ref 0.00–0.07)
Basophils Absolute: 0 K/uL (ref 0.0–0.1)
Basophils Relative: 0 %
Eosinophils Absolute: 0 K/uL (ref 0.0–0.5)
Eosinophils Relative: 2 %
HCT: 33.3 % — ABNORMAL LOW (ref 36.0–46.0)
Hemoglobin: 11 g/dL — ABNORMAL LOW (ref 12.0–15.0)
Immature Granulocytes: 0 %
Lymphocytes Relative: 29 %
Lymphs Abs: 0.8 K/uL (ref 0.7–4.0)
MCH: 28.9 pg (ref 26.0–34.0)
MCHC: 33 g/dL (ref 30.0–36.0)
MCV: 87.6 fL (ref 80.0–100.0)
Monocytes Absolute: 0.3 K/uL (ref 0.1–1.0)
Monocytes Relative: 10 %
Neutro Abs: 1.6 K/uL — ABNORMAL LOW (ref 1.7–7.7)
Neutrophils Relative %: 59 %
Platelet Count: 165 K/uL (ref 150–400)
RBC: 3.8 MIL/uL — ABNORMAL LOW (ref 3.87–5.11)
RDW: 12.7 % (ref 11.5–15.5)
WBC Count: 2.6 K/uL — ABNORMAL LOW (ref 4.0–10.5)
nRBC: 0 % (ref 0.0–0.2)

## 2024-05-24 LAB — CMP (CANCER CENTER ONLY)
ALT: 12 U/L (ref 0–44)
AST: 17 U/L (ref 15–41)
Albumin: 4.1 g/dL (ref 3.5–5.0)
Alkaline Phosphatase: 88 U/L (ref 38–126)
Anion gap: 4 — ABNORMAL LOW (ref 5–15)
BUN: 14 mg/dL (ref 6–20)
CO2: 31 mmol/L (ref 22–32)
Calcium: 9.3 mg/dL (ref 8.9–10.3)
Chloride: 104 mmol/L (ref 98–111)
Creatinine: 0.69 mg/dL (ref 0.44–1.00)
GFR, Estimated: >60 mL/min (ref >60)
Glucose, Bld: 105 mg/dL — ABNORMAL HIGH (ref 70–99)
Potassium: 3.9 mmol/L (ref 3.5–5.1)
Sodium: 139 mmol/L (ref 135–145)
Total Bilirubin: 0.4 mg/dL (ref 0.0–1.2)
Total Protein: 7.3 g/dL (ref 6.5–8.1)

## 2024-05-24 LAB — TSH: TSH: 7.34 u[IU]/mL — ABNORMAL HIGH (ref 0.350–4.500)

## 2024-05-24 LAB — VITAMIN B12: Vitamin B-12: 534 pg/mL (ref 180–914)

## 2024-05-24 LAB — VITAMIN D 25 HYDROXY (VIT D DEFICIENCY, FRACTURES): Vit D, 25-Hydroxy: 24.8 ng/mL — ABNORMAL LOW (ref 30–100)

## 2024-05-24 MED ORDER — GABAPENTIN 300 MG PO CAPS: 300.0000 mg | ORAL_CAPSULE | Freq: Every day | ORAL | 1 refills | Status: AC

## 2024-05-24 NOTE — Progress Notes (Signed)
 BRIEF ONCOLOGIC HISTORY:  Oncology History  Malignant neoplasm of upper-outer quadrant of right female breast (HCC)  09/26/2022 Mammogram   Patient had a palpable right breast mass and hence underwent bilateral diagnostic mammogram.  This confirmed a suspicious 3 cm right upper outer quadrant mass and an abnormal right axillary lymph node with cortical thickening.  Tissue sampling of both the breast mass and axillary lymph node were recommended.  Left breast was normal.   09/26/2022 Breast US    Ultrasound breast confirmed the above-mentioned findings.   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  Prognostic showed ER 50% positive weak staining PR 0% negative HER2 1+ by IHC and Ki-67 of 70%   10/08/2022 Cancer Staging   Staging form: Breast, AJCC 8th Edition - Clinical: Stage IIB (cT2, cN0, cM0, G3, ER+, PR-, HER2-) - Signed by [name], MD on 10/08/2022 Stage prefix: Initial diagnosis Histologic grading system: 3 grade system   10/24/2022 - 11/14/2022 Chemotherapy   Patient is on Treatment Plan : BREAST Pembrolizumab  (200) D1 + Carboplatin  (5) D1 + Paclitaxel  (80) D1,8,15 q21d X 4 cycles / Pembrolizumab  (200) D1 + AC D1 q21d x 4 cycles      Genetic Testing   [facility] Common Cancer Panel+RNA was Negative. Report date is 10/16/2022.  The Common Hereditary Cancers Panel offered by [facility] includes sequencing and/or deletion duplication testing of the following 48 genes: APC, ATM, AXIN2, BAP1, BARD1, BMPR1A, BRCA1, BRCA2, BRIP1, CDH1, CDK4, CDKN2A (p14ARF and p16INK4a only), CHEK2, CTNNA1, DICER1, EPCAM (Deletion/duplication testing only), FH, GREM1 (promoter region duplication testing only), HOXB13, KIT, MBD4, MEN1, MLH1, MSH2, MSH3, MSH6, MUTYH, NF1, NHTL1, PALB2, PDGFRA, PMS2, POLD1, POLE, PTEN, RAD51C, RAD51D, SDHA (sequencing analysis only except exon 14), SDHB, SDHC, SDHD, SMAD4, SMARCA4. STK11, TP53, TSC1, TSC2, and VHL.    11/21/2022 - 04/08/2023 Chemotherapy   Patient is on Treatment Plan : BREAST Pembrolizumab  (200) D1 + Carboplatin  (1.5) D1,8,15 + Paclitaxel  (80) D1,8,15 q21d X 4 cycles / Pembrolizumab  (200) D1 + AC D1 q21d x 4 cycles     05/19/2023 - 09/28/2023 Chemotherapy   Patient is on Treatment Plan : BREAST Pembrolizumab  (200) q21d x 27 weeks     05/21/2023 Surgery   Right lumpectomy: metaplastic IDC with chondroid differentiation, 1.8 cm, grade 3, margins negative, no LVI, 8 SLN negative, repeat prognostic panel: ER 40% positive, weak staining, PR 0% negative, Ki-67 80%, HER2 negative (0). RCB-II   07/06/2023 - 08/04/2023 Radiation Therapy   Plan Name: Breast_R Site: Breast, Right Technique: 3D Mode: Photon Dose Per Fraction: 2.66 Gy Prescribed Dose (Delivered / Prescribed): 42.56 Gy / 42.56 Gy Prescribed Fxs (Delivered / Prescribed): 16 / 16   Plan Name: Breast_R_Bst Site: Breast, Right Technique: 3D Mode: Photon Dose Per Fraction: 2 Gy Prescribed Dose (Delivered / Prescribed): 8 Gy / 8 Gy Prescribed Fxs (Delivered / Prescribed): 4 / 4   12/2023 -  Anti-estrogen oral therapy   Tamoxifen      INTERVAL HISTORY:   Discussed the use of AI scribe software for clinical note transcription with the patient, who gave verbal consent to proceed.  History of Present Illness Maria Carter is a 53 year old female with breast cancer who presents with worsening neuropathy and joint pain.  She experiences a return of tingling and pain in her feet and hands, which she describes as neuropathy. These symptoms have worsened since her last visit. She completed chemotherapy last year, and the neuropathy may  be related to this treatment. She is currently taking medication for her thyroid , which was last checked in July and showed improvement compared to the previous year. She is awaiting the results of a recent thyroid  test conducted today.  She reports joint pain. However, she has not yet started taking  tamoxifen , which was prescribed in June. She recalls discussing the medication's side effects previously and mentions that it was dispensed to her pharmacy of choice.  She mentions a recent issue with a blood draw for the Guardant Reveal test, where multiple attempts were made before successfully drawing blood today. She brought the kit to the appointment for processing.  Her family is planning a trip in February, and she inquires about her ability to enjoy the trip given her current health status. She expresses concern about her ability to fight infections, noting that her white blood cell count has been low since before her chemotherapy treatment in 2024, but it has not worsened and is on a slightly upward trend. Her hemoglobin levels are stable.  She inquires about vaccinations for flu and COVID. Her mammogram is not due until March 2025.    REVIEW OF SYSTEMS:  Review of Systems  Constitutional:  Positive for fatigue. Negative for appetite change, chills, fever and unexpected weight change.  HENT:   Negative for hearing loss, lump/mass and trouble swallowing.   Eyes:  Negative for eye problems and icterus.  Respiratory:  Negative for chest tightness, cough and shortness of breath.   Cardiovascular:  Negative for chest pain, leg swelling and palpitations.  Gastrointestinal:  Negative for abdominal distention, abdominal pain, constipation, diarrhea, nausea and vomiting.  Endocrine: Negative for hot flashes.  Genitourinary:  Negative for difficulty urinating.   Musculoskeletal:  Negative for arthralgias.  Skin:  Negative for itching and rash.  Neurological:  Negative for dizziness, extremity weakness and headaches.  Hematological:  Negative for adenopathy. Does not bruise/bleed easily.  Psychiatric/Behavioral:  Negative for depression. The patient is not nervous/anxious.    Breast: Denies any new nodularity, masses, tenderness, nipple changes, or nipple discharge.       PAST  MEDICAL/SURGICAL HISTORY:  Past Medical History:  Diagnosis Date   Anemia    Anxiety    Breast cancer (HCC)    right breast IDC   Depression    Hx of febrile seizure    1980   Hypothyroidism    Osteopenia    2025 -1.8 frax neg. repeat in 2 years   Past Surgical History:  Procedure Laterality Date   BREAST BIOPSY Right 09/30/2022   US  RT BREAST BX W LOC DEV 1ST LESION IMG BX SPEC US  GUIDE 09/30/2022 [facility]   BREAST BIOPSY  05/20/2023   MM RT RADIOACTIVE SEED LOC MAMMO GUIDE 05/20/2023 [facility]   BREAST LUMPECTOMY Right 05/21/2023   BREAST LUMPECTOMY WITH RADIOACTIVE SEED AND SENTINEL LYMPH NODE BIOPSY Right 05/21/2023   Procedure: RIGHT BREAST LUMPECTOMY WITH RADIOACTIVE SEED AND SENTINEL LYMPH NODE BIOPSY;  Surgeon: [name], MD;  Location: [facility];  Service: General;  Laterality: Right;  LMA PEC BLOCK   DILATATION & CURRETTAGE/HYSTEROSCOPY WITH RESECTOCOPE N/A 04/14/2024   Procedure: DILATATION & CURETTAGE/HYSTEROSCOPY;  Surgeon: Glennon Almarie POUR, MD;  Location: [facility] OR;  Service: Gynecology;  Laterality: N/A;  Myosure and ECC   EXCISION OF KELOID N/A 05/21/2023   Procedure: EXCISION CHEST WALL SCAR;  Surgeon: [name], MD;  Location: [facility];  Service: General;  Laterality: N/A;   MYOSURE RESECTION  04/14/2024   Procedure: MYOSURE RESECTION;  Surgeon: Glennon Almarie POUR, MD;  Location: [facility];  Service: Gynecology;;   PORTACATH PLACEMENT N/A 10/23/2022   Procedure: INSERTION PORT-A-CATH;  Surgeon: [name], MD;  Location: [facility];  Service: General;  Laterality: N/A;     ALLERGIES:  No Known Allergies   CURRENT MEDICATIONS:  Outpatient Encounter Medications as of 05/24/2024  Medication Sig   acetaminophen  (TYLENOL ) 325 MG tablet Take 650 mg by mouth every 6 (six) hours as needed.   ALPRAZolam (XANAX) 0.25 MG tablet Take 1 tab  daily  prn anxiety   busPIRone (BUSPAR) 15 MG  tablet Take by mouth.   desvenlafaxine (PRISTIQ) 50 MG 24 hr tablet Take 50 mg by mouth daily.   ergocalciferol  (VITAMIN D2) 1.25 MG (50000 UT) capsule Take 1 capsule (50,000 Units total) by mouth once a week.   ibuprofen  (ADVIL ) 800 MG tablet Take 1 tablet (800 mg total) by mouth every 8 (eight) hours as needed.   levothyroxine  (SYNTHROID ) 25 MCG tablet Take 2 tablets (50 mcg total) by mouth daily.   metoCLOPramide  (REGLAN ) 10 MG tablet Take 1 tablet (10 mg total) by mouth every 8 (eight) hours as needed.   Multiple Vitamins-Minerals (MULTIVITAMIN ADULTS PO) Take 1 tablet by mouth daily.   tamoxifen  (NOLVADEX ) 20 MG tablet Take 1 tablet (20 mg total) by mouth daily. (Patient not taking: Reported on 05/24/2024)   No facility-administered encounter medications on file as of 05/24/2024.     ONCOLOGIC FAMILY HISTORY:  Family History  Problem Relation Age of Onset   Multiple myeloma Father    Breast cancer Sister 77 - 52   Cancer Paternal Grandmother        unknown type   Colon polyps Neg Hx    Colon cancer Neg Hx    Esophageal cancer Neg Hx    Rectal cancer Neg Hx    Stomach cancer Neg Hx      SOCIAL HISTORY:  Social History   Socioeconomic History   Marital status: Single    Spouse name: Not on file   Number of children: Not on file   Years of education: Not on file   Highest education level: GED or equivalent  Occupational History   Not on file  Tobacco Use   Smoking status: Never   Smokeless tobacco: Never  Vaping Use   Vaping status: Never Used  Substance and Sexual Activity   Alcohol use: Not Currently   Drug use: No   Sexual activity: Not Currently    Partners: Male    Birth control/protection: Post-menopausal, Abstinence  Other Topics Concern   Not on file  Social History Narrative   Not on file   Social Drivers of Health   Financial Resource Strain: Medium Risk (03/12/2024)   Overall Financial Resource Strain (CARDIA)    Difficulty of Paying Living  Expenses: Somewhat hard  Food Insecurity: No Food Insecurity (03/12/2024)   Hunger Vital Sign    Worried About Running Out of Food in the Last Year: Never true    Ran Out of Food in the Last Year: Never true  Recent Concern: Food Insecurity - Food Insecurity Present (12/18/2023)   Hunger Vital Sign    Worried About Running Out of Food in the Last Year: Sometimes true    Ran Out of Food in the Last Year: Sometimes true  Transportation Needs: Unmet Transportation Needs (03/12/2024)   PRAPARE - Administrator, Civil Service (Medical): Yes  Lack of Transportation (Non-Medical): Yes  Physical Activity: Inactive (03/12/2024)   Exercise Vital Sign    Days of Exercise per Week: 0 days    Minutes of Exercise per Session: Not on file  Stress: Stress Concern Present (03/12/2024)   [employer] of Occupational Health - Occupational Stress Questionnaire    Feeling of Stress: Very much  Social Connections: Moderately Isolated (03/12/2024)   Social Connection and Isolation Panel    Frequency of Communication with Friends and Family: Three times a week    Frequency of Social Gatherings with Friends and Family: Never    Attends Religious Services: Never    Database administrator or Organizations: Yes    Attends Banker Meetings: Never    Marital Status: Never married  Catering manager Violence: Not on file     OBSERVATIONS/OBJECTIVE:  BP (!) 122/59 (BP Location: Left Arm, Patient Position: Sitting, Cuff Size: Normal) Comment: informed dr of low bp  Pulse 73   Temp 98.7 F (37.1 C) (Temporal)   Resp 19   Wt 176 lb (79.8 kg)   LMP  (LMP Unknown)   SpO2 100%   BMI 31.18 kg/m  GENERAL: Patient is a well appearing female in no acute distress HEENT:  Sclerae anicteric.  Oropharynx clear and moist. No ulcerations or evidence of oropharyngeal candidiasis. Neck is supple.  NODES:  No cervical, supraclavicular, or axillary lymphadenopathy palpated.  BREAST EXAM: right  breast s/p lumpectomy and radiation, no sign of local recurrence, left breast benign LUNGS:  Clear to auscultation bilaterally.  No wheezes or rhonchi. HEART:  Regular rate and rhythm. No murmur appreciated. EXTREMITIES:  No peripheral edema.   SKIN:  Clear with no obvious rashes or skin changes. No nail dyscrasia. NEURO:  Nonfocal. Well oriented.  Appropriate affect.   LABORATORY DATA:  None for this visit.  DIAGNOSTIC IMAGING:  None for this visit.      ASSESSMENT AND PLAN:  Ms.. Bobeck is a pleasant 53 y.o. female with Stage IIB right breast invasive ductal carcinoma, ER+/PR-/HER2-, diagnosed in 09/2024, treated with neoadjuvant chemotherapy, lumpectomy, maintenance Keytruda , adjuvant radiation therapy, and anti-estrogen therapy with Tamoxifen  beginning in 12/2023.    Assessment and Plan Assessment & Plan ER weakly pos, PR neg and Her 2 ng breast cnacer Completed chemotherapy in 2024. Tamoxifen  therapy was prescribed but not started. Emphasized tamoxifen 's role in preventing recurrence by targeting estrogen receptor  - Instruct to start tamoxifen  therapy. - Mammogram scheduled for March 2025.  Chemotherapy-induced peripheral neuropathy Tingling and pain in hands and feet likely due to chemotherapy-induced peripheral neuropathy. Symptoms worsened post-chemotherapy. Discussed gabapentin  treatment, starting at low dose due to sedative effects. - Prescribe gabapentin  at a low dose to be taken at night. - Monitor for relief of neuropathy symptoms.  Chronic leukopenia Chronic leukopenia since 2017 with mildly low white blood cell count. Counts stable with slight upward trend. Adequate white blood cells to fight infections and receive vaccinations.  Lymphedema Mild lymphedema observed. No acute intervention required.  Arthralgia, no clear etiology She will continue to monitor and report any worsening symptoms.  Time spent: 30 min  *Total Encounter Time as defined by the Centers  for Medicare and Medicaid Services includes, in addition to the face-to-face time of a patient visit (documented in the note above) non-face-to-face time: obtaining and reviewing outside history, ordering and reviewing medications, tests or procedures, care coordination (communications with other health care professionals or caregivers) and documentation in the medical record.

## 2024-05-25 LAB — T4: T4, Total: 5.8 ug/dL (ref 4.5–12.0)

## 2024-05-30 ENCOUNTER — Encounter: Payer: Self-pay | Admitting: "Endocrinology

## 2024-05-30 ENCOUNTER — Ambulatory Visit: Admitting: "Endocrinology

## 2024-05-30 VITALS — BP 110/80 | HR 74 | Ht 63.0 in | Wt 177.0 lb

## 2024-05-30 DIAGNOSIS — E039 Hypothyroidism, unspecified: Secondary | ICD-10-CM

## 2024-05-30 DIAGNOSIS — E010 Iodine-deficiency related diffuse (endemic) goiter: Secondary | ICD-10-CM

## 2024-05-30 MED ORDER — LEVOTHYROXINE SODIUM 75 MCG PO TABS: 75.0000 ug | ORAL_TABLET | Freq: Every day | ORAL | 3 refills | Status: DC

## 2024-05-30 NOTE — Progress Notes (Signed)
 Outpatient Endocrinology Note Maria Birmingham, MD  05/30/24   Maria Carter [birth year] [id number]  Referring Provider: Iruku, Praveena, MD Primary Care Provider: Lendia Boby CROME, NP-C Subjective  No chief complaint on file.   Assessment & Plan  Diagnoses and all orders for this visit:  Acquired hypothyroidism -     TSH + free T4 -     US  THYROID ; Future -     US  THYROID   Thyromegaly -     US  THYROID ; Future -     US  THYROID   Other orders -     levothyroxine  (SYNTHROID ) 75 MCG tablet; Take 1 tablet (75 mcg total) by mouth daily.    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 Patient is currently biochemically hypothyroid.  Educated on thyroid  axis.  Recommend the following: Take levothyroxine  75 every morning.  Advised to take levothyroxine  first thing in the morning on empty stomach and wait at least 30 minutes to 1 hour before eating or drinking anything or taking any other medications. Space out levothyroxine  by 4 hours from any acid reflux medication/fibrate/iron/calcium/multivitamin. Advised to take birth control pills and nutritional supplements in the evening. Repeat lab before next visit or sooner if symptoms of hyperthyroidism or hypothyroidism develop.  Notify us  immediately in case of pregnancy/breastfeeding or significant weight gain or loss. Counseled on compliance and follow up needs.  Mild thyromegaly noted one exam Ordered baseline thyroid  U/S  I have reviewed current medications, nurse's notes, allergies, vital signs, past medical and surgical history, family medical history, and social history for this encounter. Counseled patient on symptoms, examination findings, lab findings, imaging results, treatment decisions and monitoring and prognosis. The patient understood the recommendations and agrees with the treatment plan. All questions regarding treatment plan were fully answered.   Return in about 3 months (around  08/29/2024) for visit + labs before next visit.   Maria Birmingham, MD  05/30/24   I have reviewed current medications, nurse's notes, allergies, vital signs, past medical and surgical history, family medical history, and social history for this encounter. Counseled patient on symptoms, examination findings, lab findings, imaging results, treatment decisions and monitoring and prognosis. The patient understood the recommendations and agrees with the treatment plan. All questions regarding treatment plan were fully answered.   History of Present Illness Maria Carter is a 53 y.o. year old female who presents to our clinic with hypothyroidism diagnosed in 09/2021.    Developed hypothyroidism 1 mo after Keytruda  for breast cancer.   Symptoms suggestive of HYPOTHYROIDISM:  fatigue Yes weight gain Yes cold intolerance  No constipation  No  Symptoms suggestive of HYPERTHYROIDISM:  weight loss  No heat intolerance Yes hyperdefecation  No palpitations  No  Compressive symptoms:  dysphagia  No dysphonia  Yes, sometimes  positional dyspnea (especially with simultaneous arms elevation)  No  Smokes  No On biotin  No Personal history of head/neck surgery/irradiation  No  Physical Exam  BP 110/80   Pulse 74   Ht 5' 3 (1.6 m)   Wt 177 lb (80.3 kg)   LMP  (LMP Unknown)   SpO2 96%   BMI 31.35 kg/m  Constitutional: well developed, well nourished Head: normocephalic, atraumatic, no exophthalmos Eyes: sclera anicteric, no redness Neck: + thyromegaly?, no thyroid  tenderness; no nodules palpated Lungs: normal respiratory effort Neurology: alert and oriented, no fine hand tremor Skin: dry, no appreciable rashes Musculoskeletal: no appreciable defects Psychiatric: normal mood and affect  Allergies No  Known Allergies  Current Medications Patient's Medications  New Prescriptions   LEVOTHYROXINE  (SYNTHROID ) 75 MCG TABLET    Take 1 tablet (75 mcg total) by mouth daily.  Previous  Medications   ACETAMINOPHEN  (TYLENOL ) 325 MG TABLET    Take 650 mg by mouth every 6 (six) hours as needed.   ALPRAZOLAM (XANAX) 0.25 MG TABLET    Take 1 tab  daily  prn anxiety   BUSPIRONE (BUSPAR) 15 MG TABLET    Take by mouth.   DESVENLAFAXINE (PRISTIQ) 50 MG 24 HR TABLET    Take 50 mg by mouth daily.   ERGOCALCIFEROL  (VITAMIN D2) 1.25 MG (50000 UT) CAPSULE    Take 1 capsule (50,000 Units total) by mouth once a week.   GABAPENTIN  (NEURONTIN ) 300 MG CAPSULE    Take 1 capsule (300 mg total) by mouth at bedtime.   IBUPROFEN  (ADVIL ) 800 MG TABLET    Take 1 tablet (800 mg total) by mouth every 8 (eight) hours as needed.   METOCLOPRAMIDE  (REGLAN ) 10 MG TABLET    Take 1 tablet (10 mg total) by mouth every 8 (eight) hours as needed.   MULTIPLE VITAMINS-MINERALS (MULTIVITAMIN ADULTS PO)    Take 1 tablet by mouth daily.   TAMOXIFEN  (NOLVADEX ) 20 MG TABLET    Take 1 tablet (20 mg total) by mouth daily.  Modified Medications   No medications on file  Discontinued Medications   LEVOTHYROXINE  (SYNTHROID ) 25 MCG TABLET    Take 2 tablets (50 mcg total) by mouth daily.    Past Medical History Past Medical History:  Diagnosis Date   Anemia    Anxiety    Breast cancer (HCC)    right breast IDC   Depression    Hx of febrile seizure    1980   Hypothyroidism    Osteopenia    2025 -1.8 frax neg. repeat in 2 years    Past Surgical History Past Surgical History:  Procedure Laterality Date   BREAST BIOPSY Right 09/30/2022   US  RT BREAST BX W LOC DEV 1ST LESION IMG BX SPEC US  GUIDE 09/30/2022 [facility]   BREAST BIOPSY  05/20/2023   MM RT RADIOACTIVE SEED LOC MAMMO GUIDE 05/20/2023 [facility]   BREAST LUMPECTOMY Right 05/21/2023   BREAST LUMPECTOMY WITH RADIOACTIVE SEED AND SENTINEL LYMPH NODE BIOPSY Right 05/21/2023   Procedure: RIGHT BREAST LUMPECTOMY WITH RADIOACTIVE SEED AND SENTINEL LYMPH NODE BIOPSY;  Surgeon: [name], MD;  Location: [facility];  Service:  General;  Laterality: Right;  LMA PEC BLOCK   DILATATION & CURRETTAGE/HYSTEROSCOPY WITH RESECTOCOPE N/A 04/14/2024   Procedure: DILATATION & CURETTAGE/HYSTEROSCOPY;  Surgeon: Glennon Almarie POUR, MD;  Location: [facility] OR;  Service: Gynecology;  Laterality: N/A;  Myosure and ECC   EXCISION OF KELOID N/A 05/21/2023   Procedure: EXCISION CHEST WALL SCAR;  Surgeon: [name], MD;  Location: [facility];  Service: General;  Laterality: N/A;   MYOSURE RESECTION  04/14/2024   Procedure: MYOSURE RESECTION;  Surgeon: Glennon Almarie POUR, MD;  Location: [facility] OR;  Service: Gynecology;;   PORTACATH PLACEMENT N/A 10/23/2022   Procedure: INSERTION PORT-A-CATH;  Surgeon: [name], MD;  Location: [facility];  Service: General;  Laterality: N/A;    Family History family history includes Breast cancer (age of onset: 19 - 70) in her sister; Cancer in her paternal grandmother; Multiple myeloma in her father.  Social History Social History   Socioeconomic History   Marital status: Single  Spouse name: Not on file   Number of children: Not on file   Years of education: Not on file   Highest education level: GED or equivalent  Occupational History   Not on file  Tobacco Use   Smoking status: Never   Smokeless tobacco: Never  Vaping Use   Vaping status: Never Used  Substance and Sexual Activity   Alcohol use: Not Currently   Drug use: No   Sexual activity: Not Currently    Partners: Male    Birth control/protection: Post-menopausal, Abstinence  Other Topics Concern   Not on file  Social History Narrative   Not on file   Social Drivers of Health   Financial Resource Strain: Medium Risk (03/12/2024)   Overall Financial Resource Strain (CARDIA)    Difficulty of Paying Living Expenses: Somewhat hard  Food Insecurity: No Food Insecurity (03/12/2024)   Hunger Vital Sign    Worried About Running Out of Food in the Last Year: Never true    Ran Out of Food in  the Last Year: Never true  Recent Concern: Food Insecurity - Food Insecurity Present (12/18/2023)   Hunger Vital Sign    Worried About Running Out of Food in the Last Year: Sometimes true    Ran Out of Food in the Last Year: Sometimes true  Transportation Needs: Unmet Transportation Needs (03/12/2024)   PRAPARE - Transportation    Lack of Transportation (Medical): Yes    Lack of Transportation (Non-Medical): Yes  Physical Activity: Inactive (03/12/2024)   Exercise Vital Sign    Days of Exercise per Week: 0 days    Minutes of Exercise per Session: Not on file  Stress: Stress Concern Present (03/12/2024)   [employer] of Occupational Health - Occupational Stress Questionnaire    Feeling of Stress: Very much  Social Connections: Moderately Isolated (03/12/2024)   Social Connection and Isolation Panel    Frequency of Communication with Friends and Family: Three times a week    Frequency of Social Gatherings with Friends and Family: Never    Attends Religious Services: Never    Database administrator or Organizations: Yes    Attends Banker Meetings: Never    Marital Status: Never married  Intimate Partner Violence: Not on file    Laboratory Investigations Lab Results  Component Value Date   TSH 7.340 (H) 05/24/2024   TSH 6.99 (H) 03/18/2024   TSH 14.700 (H) 02/08/2024   FREET4 0.75 11/26/2023   FREET4 0.39 (L) 03/16/2023   FREET4 0.67 02/28/2022     No results found for: TSI   No components found for: TRAB   Lab Results  Component Value Date   CHOL 194 05/09/2010   Lab Results  Component Value Date   HDL 53 05/09/2010   Lab Results  Component Value Date   LDLCALC 129 (H) 05/09/2010   Lab Results  Component Value Date   TRIG 61 05/09/2010   Lab Results  Component Value Date   CHOLHDL 3.7 Ratio 05/09/2010   Lab Results  Component Value Date   CREATININE 0.69 05/24/2024   Lab Results  Component Value Date   GFR 100.68 03/18/2024       Component Value Date/Time   NA 139 05/24/2024 1334   K 3.9 05/24/2024 1334   CL 104 05/24/2024 1334   CO2 31 05/24/2024 1334   GLUCOSE 105 (H) 05/24/2024 1334   BUN 14 05/24/2024 1334   CREATININE 0.69 05/24/2024 1334   CALCIUM 9.3 05/24/2024  1334   PROT 7.3 05/24/2024 1334   ALBUMIN 4.1 05/24/2024 1334   AST 17 05/24/2024 1334   ALT 12 05/24/2024 1334   ALKPHOS 88 05/24/2024 1334   BILITOT 0.4 05/24/2024 1334   GFRNONAA >60 05/24/2024 1334   GFRAA >60 12/23/2015 1151      Latest Ref Rng & Units 05/24/2024    1:34 PM 03/18/2024   11:57 AM 02/18/2024    2:52 PM  BMP  Glucose 70 - 99 mg/dL 894  896  90   BUN 6 - 20 mg/dL 14  13  15    Creatinine 0.44 - 1.00 mg/dL 9.30  9.32  9.23   Sodium 135 - 145 mmol/L 139  137  139   Potassium 3.5 - 5.1 mmol/L 3.9  4.0  4.2   Chloride 98 - 111 mmol/L 104  101  104   CO2 22 - 32 mmol/L 31  29  30    Calcium 8.9 - 10.3 mg/dL 9.3  9.8  9.6        Component Value Date/Time   WBC 2.6 (L) 05/24/2024 1334   WBC 2.1 (L) 04/14/2024 1150   RBC 3.80 (L) 05/24/2024 1334   HGB 11.0 (L) 05/24/2024 1334   HCT 33.3 (L) 05/24/2024 1334   PLT 165 05/24/2024 1334   MCV 87.6 05/24/2024 1334   MCH 28.9 05/24/2024 1334   MCHC 33.0 05/24/2024 1334   RDW 12.7 05/24/2024 1334   LYMPHSABS 0.8 05/24/2024 1334   MONOABS 0.3 05/24/2024 1334   EOSABS 0.0 05/24/2024 1334   BASOSABS 0.0 05/24/2024 1334      Parts of this note may have been dictated using voice recognition software. There may be variances in spelling and vocabulary which are unintentional. Not all errors are proofread. Please notify the dino if any discrepancies are noted or if the meaning of any statement is not clear.

## 2024-05-31 DIAGNOSIS — C50411 Malignant neoplasm of upper-outer quadrant of right female breast: Secondary | ICD-10-CM | POA: Diagnosis not present

## 2024-06-01 ENCOUNTER — Telehealth: Payer: Self-pay

## 2024-06-01 NOTE — Telephone Encounter (Signed)
 Called pt per MD to advise [employer] Reve[facility] testing was negative/not detected. Pt verbalized understanding of results and knows [facility] will be in touch to schedule 6 mo repeat lab.

## 2024-06-06 ENCOUNTER — Encounter: Payer: Self-pay | Admitting: Hematology and Oncology

## 2024-06-07 LAB — GUARDANT REVEAL

## 2024-06-08 ENCOUNTER — Ambulatory Visit (HOSPITAL_COMMUNITY)
Admission: RE | Admit: 2024-06-08 | Discharge: 2024-06-08 | Disposition: A | Discharge status: Home / Self Care | Source: Ambulatory Visit | Attending: "Endocrinology | Admitting: "Endocrinology

## 2024-06-08 ENCOUNTER — Ambulatory Visit (HOSPITAL_COMMUNITY)

## 2024-06-08 DIAGNOSIS — E010 Iodine-deficiency related diffuse (endemic) goiter: Secondary | ICD-10-CM | POA: Insufficient documentation

## 2024-06-08 DIAGNOSIS — Z0389 Encounter for observation for other suspected diseases and conditions ruled out: Secondary | ICD-10-CM | POA: Diagnosis not present

## 2024-06-08 DIAGNOSIS — E039 Hypothyroidism, unspecified: Secondary | ICD-10-CM | POA: Diagnosis not present

## 2024-07-04 ENCOUNTER — Emergency Department (HOSPITAL_COMMUNITY)
Admission: EM | Admit: 2024-07-04 | Discharge: 2024-07-05 | Disposition: A | Attending: Emergency Medicine | Admitting: Emergency Medicine

## 2024-07-04 ENCOUNTER — Other Ambulatory Visit: Payer: Self-pay

## 2024-07-04 ENCOUNTER — Emergency Department (HOSPITAL_COMMUNITY)

## 2024-07-04 ENCOUNTER — Encounter (HOSPITAL_COMMUNITY): Payer: Self-pay

## 2024-07-04 DIAGNOSIS — Z853 Personal history of malignant neoplasm of breast: Secondary | ICD-10-CM | POA: Insufficient documentation

## 2024-07-04 DIAGNOSIS — R079 Chest pain, unspecified: Secondary | ICD-10-CM | POA: Diagnosis not present

## 2024-07-04 DIAGNOSIS — J9811 Atelectasis: Secondary | ICD-10-CM | POA: Diagnosis not present

## 2024-07-04 DIAGNOSIS — R0789 Other chest pain: Secondary | ICD-10-CM | POA: Diagnosis not present

## 2024-07-04 DIAGNOSIS — R202 Paresthesia of skin: Secondary | ICD-10-CM | POA: Insufficient documentation

## 2024-07-04 NOTE — ED Triage Notes (Signed)
 Pt is coming in for generalized body pain and chest pain she has been having for around 5 to 6 days but it has been worsening in the last 2 to 3 days. She has made an appointment for the doc but it is not until the 13th. She has a secondary complaint of facial twitching more so in her eye. She is otherwise stable at this time in triage with no shortness of breath, no fevers.

## 2024-07-04 NOTE — ED Notes (Signed)
 Will need PORT accessed to get blood

## 2024-07-04 NOTE — ED Notes (Signed)
 Obtained EKG in lobby while waiting for triage. Given to MD

## 2024-07-05 ENCOUNTER — Emergency Department (HOSPITAL_COMMUNITY)

## 2024-07-05 ENCOUNTER — Telehealth: Payer: Self-pay | Admitting: "Endocrinology

## 2024-07-05 DIAGNOSIS — R569 Unspecified convulsions: Secondary | ICD-10-CM | POA: Diagnosis not present

## 2024-07-05 DIAGNOSIS — J9811 Atelectasis: Secondary | ICD-10-CM | POA: Diagnosis not present

## 2024-07-05 LAB — COMPREHENSIVE METABOLIC PANEL WITH GFR
ALT: 12 U/L (ref 0–44)
AST: 21 U/L (ref 15–41)
Albumin: 4.3 g/dL (ref 3.5–5.0)
Alkaline Phosphatase: 112 U/L (ref 38–126)
Anion gap: 10 (ref 5–15)
BUN: 17 mg/dL (ref 6–20)
CO2: 26 mmol/L (ref 22–32)
Calcium: 9.4 mg/dL (ref 8.9–10.3)
Chloride: 104 mmol/L (ref 98–111)
Creatinine, Ser: 0.7 mg/dL (ref 0.44–1.00)
GFR, Estimated: >60 mL/min (ref >60)
Glucose, Bld: 126 mg/dL — ABNORMAL HIGH (ref 70–99)
Potassium: 3.9 mmol/L (ref 3.5–5.1)
Sodium: 139 mmol/L (ref 135–145)
Total Bilirubin: 0.3 mg/dL (ref 0.0–1.2)
Total Protein: 7.7 g/dL (ref 6.5–8.1)

## 2024-07-05 LAB — CBC
HCT: 36.2 % (ref 36.0–46.0)
Hemoglobin: 11.2 g/dL — ABNORMAL LOW (ref 12.0–15.0)
MCH: 27.7 pg (ref 26.0–34.0)
MCHC: 30.9 g/dL (ref 30.0–36.0)
MCV: 89.4 fL (ref 80.0–100.0)
Platelets: 149 K/uL — ABNORMAL LOW (ref 150–400)
RBC: 4.05 MIL/uL (ref 3.87–5.11)
RDW: 12.8 % (ref 11.5–15.5)
WBC: 3.4 K/uL — ABNORMAL LOW (ref 4.0–10.5)
nRBC: 0 % (ref 0.0–0.2)

## 2024-07-05 LAB — MAGNESIUM: Magnesium: 1.8 mg/dL (ref 1.7–2.4)

## 2024-07-05 LAB — TROPONIN T, HIGH SENSITIVITY: Troponin T High Sensitivity: <15 ng/L (ref 0–19)

## 2024-07-05 MED ORDER — IOHEXOL 350 MG/ML SOLN
75.0000 mL | Freq: Once | INTRAVENOUS | Status: AC | PRN
  Administered 2024-07-05: 75 mL via INTRAVENOUS

## 2024-07-05 MED ORDER — HEPARIN SOD (PORK) LOCK FLUSH 100 UNIT/ML IV SOLN
INTRAVENOUS | Status: AC
  Administered 2024-07-05: 500 [IU]
  Filled 2024-07-05: qty 5

## 2024-07-05 NOTE — Telephone Encounter (Signed)
 Patient is calling to ask if she can go back to her original dose of Levothyroxine .  Patient states that she started the new dose of levothyroxine  (SYNTHROID ) 75 MCG tablet  last Friday 07/01/2024 and since then she has been feeling jittery and nervous.  Patient is calling to see what she needs to do.

## 2024-07-05 NOTE — ED Provider Notes (Signed)
 [facility] AT [facility] Provider Note   CSN: [id number] Arrival date & time: 07/04/24  1949     Patient presents with: Chest Pain   Maria Carter is a 53 y.o. female.   The history is provided by the patient and medical records.  Chest Pain Maria Carter is a 53 y.o. female who presents to the Emergency Department complaining of chest pain.  She presents to the emergency department for evaluation of several days of left-sided chest pain that was initially waxing and waning and now becoming more consistent.  Pain is located under the left breast and is described as tense and pressure-like sensation.  She also complains of twitching to her left face that is intermittent since Monday as well as tingling to her left foot for the last several days.  She does have a history of thyroid  disease in her levothyroxine  was recently increased.  She also has a history of stage IIb breast cancer that was diagnosed 1 year ago and she is on maintenance therapy at this time.  She did remotely have seizures as a child that she believes are febrile seizures.  None since that time.   Prior to Admission medications   Medication Sig Start Date End Date Taking? Authorizing Provider  acetaminophen  (TYLENOL ) 325 MG tablet Take 650 mg by mouth every 6 (six) hours as needed.    [provider]  ALPRAZolam (XANAX) 0.25 MG tablet Take 1 tab  daily  prn anxiety 01/27/24   [provider]  busPIRone (BUSPAR) 15 MG tablet Take by mouth. 08/06/22   [provider]  desvenlafaxine (PRISTIQ) 50 MG 24 hr tablet Take 50 mg by mouth daily.    [provider]  ergocalciferol  (VITAMIN D2) 1.25 MG (50000 UT) capsule Take 1 capsule (50,000 Units total) by mouth once a week. 02/19/24   Crawford Morna Pickle, NP  gabapentin  (NEURONTIN ) 300 MG capsule Take 1 capsule (300 mg total) by mouth at bedtime. 05/24/24   Iruku, Praveena, MD  ibuprofen  (ADVIL ) 800 MG  tablet Take 1 tablet (800 mg total) by mouth every 8 (eight) hours as needed. 04/12/24   Glennon Almarie POUR, MD  levothyroxine  (SYNTHROID ) 75 MCG tablet Take 1 tablet (75 mcg total) by mouth daily. 05/30/24   Motwani, Komal, MD  metoCLOPramide  (REGLAN ) 10 MG tablet Take 1 tablet (10 mg total) by mouth every 8 (eight) hours as needed. 04/12/24   Glennon Almarie POUR, MD  Multiple Vitamins-Minerals (MULTIVITAMIN ADULTS PO) Take 1 tablet by mouth daily.    [provider]  tamoxifen  (NOLVADEX ) 20 MG tablet Take 1 tablet (20 mg total) by mouth daily. 02/08/24   Iruku, Praveena, MD    Allergies: Patient has no known allergies.    Review of Systems  Cardiovascular:  Positive for chest pain.  All other systems reviewed and are negative.   Updated Vital Signs BP 103/60   Pulse 82   Temp 98.6 F (37 C) (Oral)   Resp 18   LMP  (LMP Unknown)   SpO2 100%   Physical Exam Vitals and nursing note reviewed.  Constitutional:      Appearance: She is well-developed.  HENT:     Head: Normocephalic and atraumatic.  Cardiovascular:     Rate and Rhythm: Normal rate and regular rhythm.     Heart sounds: No murmur heard. Pulmonary:     Effort: Pulmonary effort is normal. No respiratory distress.     Breath sounds: Normal  breath sounds.  Chest:     Chest wall: No tenderness.  Abdominal:     Palpations: Abdomen is soft.     Tenderness: There is no abdominal tenderness. There is no guarding or rebound.  Musculoskeletal:        General: No swelling or tenderness.  Skin:    General: Skin is warm and dry.  Neurological:     Mental Status: She is alert and oriented to person, place, and time.     Comments: Visual fields grossly intact.  No asymmetry of facial movements.  5 out of 5 strength in all 4 extremities with sensation to light touch intact in all 4 extremities.  Psychiatric:        Behavior: Behavior normal.     (all labs ordered are listed, but only abnormal results are  displayed) Labs Reviewed  CBC - Abnormal; Notable for the following components:      Result Value   WBC 3.4 (*)    Hemoglobin 11.2 (*)    Platelets 149 (*)    All other components within normal limits  COMPREHENSIVE METABOLIC PANEL WITH GFR - Abnormal; Notable for the following components:   Glucose, Bld 126 (*)    All other components within normal limits  MAGNESIUM  TROPONIN T, HIGH SENSITIVITY  TROPONIN T, HIGH SENSITIVITY    EKG: EKG Interpretation Date/Time:  Tuesday July 05 2024 01:27:19 EST Ventricular Rate:  70 PR Interval:  181 QRS Duration:  88 QT Interval:  403 QTC Calculation: 435 R Axis:   60  Text Interpretation: Sinus rhythm Confirmed by Griselda Norris [phone]) on 07/05/2024 1:29:39 AM  Radiology: CT Angio Chest PE W/Cm &/Or Wo Cm Result Date: 07/05/2024 CLINICAL DATA:  Seizure with general body aches and chest pain for 6 days. EXAM: CT ANGIOGRAPHY CHEST WITH CONTRAST TECHNIQUE: Multidetector CT imaging of the chest was performed using the standard protocol during bolus administration of intravenous contrast. Multiplanar CT image reconstructions and MIPs were obtained to evaluate the vascular anatomy. RADIATION DOSE REDUCTION: This exam was performed according to the departmental dose-optimization program which includes automated exposure control, adjustment of the mA and/or kV according to patient size and/or use of iterative reconstruction technique. CONTRAST:  75mL OMNIPAQUE  IOHEXOL  350 MG/ML SOLN COMPARISON:  None Available. FINDINGS: Cardiovascular: A left-sided venous Port-A-Cath is in place. Satisfactory opacification of the pulmonary arteries to the segmental level. No evidence of pulmonary embolism. Normal heart size. No pericardial effusion. Mediastinum/Nodes: No enlarged mediastinal, hilar, or axillary lymph nodes. Thyroid  gland, trachea, and esophagus demonstrate no significant findings. Lungs/Pleura: Mild dependent atelectasis is seen within the posterior  aspect of both lungs. No acute infiltrate, pleural effusion or pneumothorax is identified. Upper Abdomen: No acute abnormality. Musculoskeletal: No chest wall abnormality. No acute or significant osseous findings. Review of the MIP images confirms the above findings. IMPRESSION: No evidence of pulmonary embolism or other acute intrathoracic process. Electronically Signed   By: Suzen Dials M.D.   On: 07/05/2024 03:48   CT Head Wo Contrast Result Date: 07/05/2024 EXAM: CT HEAD WITHOUT CONTRAST 07/05/2024 03:31:05 AM TECHNIQUE: CT of the head was performed without the administration of intravenous contrast. Automated exposure control, iterative reconstruction, and/or weight based adjustment of the mA/kV was utilized to reduce the radiation dose to as low as reasonably achievable. COMPARISON: None available. CLINICAL HISTORY: Seizure, new-onset, no history of trauma; left facial twitching, left foot numbness for several days - hx/o breast ca FINDINGS: Limited study due to streak artifact from hair  implants. Within this limitation: BRAIN AND VENTRICLES: No acute hemorrhage. No evidence of acute infarct. No hydrocephalus. No extra-axial collection. No mass effect or midline shift. ORBITS: No acute abnormality. SINUSES: No acute abnormality. SOFT TISSUES AND SKULL: No acute soft tissue abnormality. No skull fracture. IMPRESSION: 1. Limited study without evidence of acute intracranial abnormality. Electronically signed by: Gilmore Molt MD 07/05/2024 03:38 AM EST RP Workstation: [id number]   DG Chest 2 View Result Date: 07/04/2024 CLINICAL DATA:  Mid chest pain for 3 days EXAM: CHEST - 2 VIEW COMPARISON:  12/16/2021 FINDINGS: Frontal and lateral views of the chest demonstrate left chest wall port tip within the superior vena cava. The cardiac silhouette is unremarkable. No airspace disease, effusion, or pneumothorax. No acute bony abnormalities. IMPRESSION: 1. No acute intrathoracic process. Electronically  Signed   By: Ozell Daring M.D.   On: 07/04/2024 20:48     Procedures   Medications Ordered in the ED  iohexol  (OMNIPAQUE ) 350 MG/ML injection 75 mL (75 mLs Intravenous Contrast Given 07/05/24 0309)                                    Medical Decision Making Amount and/or Complexity of Data Reviewed Labs: ordered. Radiology: ordered.  Risk Prescription drug management.   Patient with history of breast cancer in remission currently on tamoxifen  here for evaluation of intermittent left-sided chest pain, paresthesias and twitching to her left face.  EKG is nonischemic and troponin is negative.  Chest x-ray is negative for acute abnormality.  Given her history of breast cancer a CTA was obtained, which is negative for PE or acute infiltrate.  She does report twitching to her left face, she did not have any during her ED stay.  She has no focal neurologic deficits on examination.  CT head is negative for acute abnormality.  Labs with stable pancytopenia.  Discussed with patient recommendation for MRI to further evaluate her neurologic symptoms and she declines at this time.  No evidence of seizures in the department.  In terms of her chest pain, not consistent with ACS.  Feel she is stable for discharge home with outpatient follow-up.  Discussed OTC analgesics she may try.  Discussed outpatient follow-up and return precautions.     Final diagnoses:  Atypical chest pain  Paresthesia    ED Discharge Orders     None          Griselda Norris, MD 07/05/24 [phone]

## 2024-07-06 NOTE — Telephone Encounter (Signed)
 ATCx1 to schedule appointment, but mailbox full so could not leave message.  MyCx1.

## 2024-07-07 ENCOUNTER — Other Ambulatory Visit

## 2024-07-08 LAB — T4, FREE: Free T4: 0.9 ng/dL (ref 0.8–1.8)

## 2024-07-08 LAB — TSH+FREE T4: TSH W/REFLEX TO FT4: 4.7 m[IU]/L — ABNORMAL HIGH

## 2024-07-12 ENCOUNTER — Telehealth: Payer: Self-pay | Admitting: "Endocrinology

## 2024-07-12 NOTE — Telephone Encounter (Signed)
 Patient is calling for lab results.

## 2024-07-14 ENCOUNTER — Ambulatory Visit: Admitting: Family Medicine

## 2024-07-15 ENCOUNTER — Telehealth: Payer: Self-pay

## 2024-07-15 NOTE — Telephone Encounter (Signed)
 See other encounter

## 2024-07-15 NOTE — Telephone Encounter (Signed)
 Pt is waiting on lab results. Pt has been off mediation for two weeks now.

## 2024-07-19 ENCOUNTER — Encounter: Payer: Self-pay | Admitting: "Endocrinology

## 2024-07-19 ENCOUNTER — Ambulatory Visit (INDEPENDENT_AMBULATORY_CARE_PROVIDER_SITE_OTHER): Admitting: "Endocrinology

## 2024-07-19 ENCOUNTER — Inpatient Hospital Stay: Attending: Physician Assistant

## 2024-07-19 VITALS — BP 134/84 | HR 65 | Ht 63.0 in | Wt 182.0 lb

## 2024-07-19 DIAGNOSIS — E039 Hypothyroidism, unspecified: Secondary | ICD-10-CM

## 2024-07-19 DIAGNOSIS — Z79810 Long term (current) use of selective estrogen receptor modulators (SERMs): Secondary | ICD-10-CM | POA: Insufficient documentation

## 2024-07-19 DIAGNOSIS — Z923 Personal history of irradiation: Secondary | ICD-10-CM | POA: Insufficient documentation

## 2024-07-19 DIAGNOSIS — C50411 Malignant neoplasm of upper-outer quadrant of right female breast: Secondary | ICD-10-CM | POA: Insufficient documentation

## 2024-07-19 DIAGNOSIS — Z170 Estrogen receptor positive status [ER+]: Secondary | ICD-10-CM | POA: Insufficient documentation

## 2024-07-19 DIAGNOSIS — Z91148 Patient's other noncompliance with medication regimen for other reason: Secondary | ICD-10-CM | POA: Diagnosis not present

## 2024-07-19 DIAGNOSIS — Z452 Encounter for adjustment and management of vascular access device: Secondary | ICD-10-CM | POA: Insufficient documentation

## 2024-07-19 MED ORDER — LEVOTHYROXINE SODIUM 50 MCG PO TABS
50.0000 ug | ORAL_TABLET | Freq: Every day | ORAL | 3 refills | Status: AC
Start: 2024-07-19 — End: ?

## 2024-07-19 NOTE — Progress Notes (Signed)
 Outpatient Endocrinology Note [name], MD  07/19/24   CHANEY INGRAM Jul 08, 1971 [id number]  Referring Provider: Lendia Boby CROME, NP-C Primary Care Provider: Lendia Boby CROME, NP-C Subjective  No chief complaint on file.   Assessment & Plan  Diagnoses and all orders for this visit:  Acquired hypothyroidism -     TSH + free T4  Non compliance w medication regimen  Other orders -     levothyroxine  (SYNTHROID ) 50 MCG tablet; Take 1 tablet (50 mcg total) by mouth daily.   Donne M Spicer is currently taking no mediation. Couldn't tolerate levothyroxine  75 mcg po every day due to worsening anxiety.  Patient is currently biochemically hypothyroid.  Educated on thyroid  axis.  Recommend the following: Take levothyroxine  50 every morning. Discussed about need for compliance and risks otherwise.   Advised to take levothyroxine  first thing in the morning on empty stomach and wait at least 30 minutes to 1 hour before eating or drinking anything or taking any other medications. Space out levothyroxine  by 4 hours from any acid reflux medication/fibrate/iron/calcium/multivitamin. Advised to take nutritional supplements in the evening. Repeat lab before next visit or sooner if symptoms of hyperthyroidism or hypothyroidism develop.  Notify us  immediately in case of significant weight gain or loss. Counseled on compliance and follow up needs.  Mild thyromegaly noted one exam Ordered baseline thyroid  U/S 06/08/24: THYROID  ULTRASOUND: reviewed images and report: Normal thyroid  ultrasound   I have reviewed current medications, nurse's notes, allergies, vital signs, past medical and surgical history, family medical history, and social history for this encounter. Counseled patient on symptoms, examination findings, lab findings, imaging results, treatment decisions and monitoring and prognosis. The patient understood the recommendations and agrees with the treatment plan. All questions  regarding treatment plan were fully answered.   Return in about 3 months (around 10/21/2024) for televisit + labs before next visit.   [name], MD  07/19/24   I have reviewed current medications, nurse's notes, allergies, vital signs, past medical and surgical history, family medical history, and social history for this encounter. Counseled patient on symptoms, examination findings, lab findings, imaging results, treatment decisions and monitoring and prognosis. The patient understood the recommendations and agrees with the treatment plan. All questions regarding treatment plan were fully answered.   History of Present Illness Maria Carter is a 53 y.o. year old female who presents to our clinic with hypothyroidism diagnosed in 09/2021.    Developed hypothyroidism 1 mo after Keytruda  for breast cancer.   Symptoms suggestive of HYPOTHYROIDISM:  fatigue Yes weight gain Yes cold intolerance  Yes constipation  Yes  Symptoms suggestive of HYPERTHYROIDISM:  weight loss  No heat intolerance No hyperdefecation  No palpitations  No  Compressive symptoms:  dysphagia  No dysphonia  No positional dyspnea (especially with simultaneous arms elevation)  No  Smokes  No On biotin  No Personal history of head/neck surgery/irradiation  No  Physical Exam  BP 134/84   Pulse 65   Ht 5' 3 (1.6 m)   Wt 182 lb (82.6 kg)   LMP  (LMP Unknown)   SpO2 96%   BMI 32.24 kg/m  Constitutional: well developed, well nourished Head: normocephalic, atraumatic, no exophthalmos Eyes: sclera anicteric, no redness Neck: + thyromegaly?, no thyroid  tenderness; no nodules palpated Lungs: normal respiratory effort Neurology: alert and oriented, no fine hand tremor Skin: dry, no appreciable rashes Musculoskeletal: no appreciable defects Psychiatric: normal mood and affect  Allergies No Known Allergies  Current Medications  Patient's Medications  New Prescriptions   LEVOTHYROXINE  (SYNTHROID )  50 MCG TABLET    Take 1 tablet (50 mcg total) by mouth daily.  Previous Medications   ACETAMINOPHEN  (TYLENOL ) 325 MG TABLET    Take 650 mg by mouth every 6 (six) hours as needed.   ALPRAZOLAM (XANAX) 0.25 MG TABLET    Take 1 tab  daily  prn anxiety   BUSPIRONE (BUSPAR) 15 MG TABLET    Take by mouth.   DESVENLAFAXINE (PRISTIQ) 50 MG 24 HR TABLET    Take 50 mg by mouth daily.   ERGOCALCIFEROL  (VITAMIN D2) 1.25 MG (50000 UT) CAPSULE    Take 1 capsule (50,000 Units total) by mouth once a week.   GABAPENTIN  (NEURONTIN ) 300 MG CAPSULE    Take 1 capsule (300 mg total) by mouth at bedtime.   IBUPROFEN  (ADVIL ) 800 MG TABLET    Take 1 tablet (800 mg total) by mouth every 8 (eight) hours as needed.   METOCLOPRAMIDE  (REGLAN ) 10 MG TABLET    Take 1 tablet (10 mg total) by mouth every 8 (eight) hours as needed.   MULTIPLE VITAMINS-MINERALS (MULTIVITAMIN ADULTS PO)    Take 1 tablet by mouth daily.   TAMOXIFEN  (NOLVADEX ) 20 MG TABLET    Take 1 tablet (20 mg total) by mouth daily.  Modified Medications   No medications on file  Discontinued Medications   LEVOTHYROXINE  (SYNTHROID ) 75 MCG TABLET    Take 1 tablet (75 mcg total) by mouth daily.    Past Medical History Past Medical History:  Diagnosis Date   Anemia    Anxiety    Breast cancer (HCC)    right breast IDC   Depression    Hx of febrile seizure    1980   Hypothyroidism    Osteopenia    2025 -1.8 frax neg. repeat in 2 years    Past Surgical History Past Surgical History:  Procedure Laterality Date   BREAST BIOPSY Right 09/30/2022   US  RT BREAST BX W LOC DEV 1ST LESION IMG BX SPEC US  GUIDE 09/30/2022 [facility]   BREAST BIOPSY  05/20/2023   MM RT RADIOACTIVE SEED LOC MAMMO GUIDE 05/20/2023 [facility]   BREAST LUMPECTOMY Right 05/21/2023   BREAST LUMPECTOMY WITH RADIOACTIVE SEED AND SENTINEL LYMPH NODE BIOPSY Right 05/21/2023   Procedure: RIGHT BREAST LUMPECTOMY WITH RADIOACTIVE SEED AND SENTINEL LYMPH NODE BIOPSY;  Surgeon:  [name], MD;  Location: [facility];  Service: General;  Laterality: Right;  LMA PEC BLOCK   DILATATION & CURRETTAGE/HYSTEROSCOPY WITH RESECTOCOPE N/A 04/14/2024   Procedure: DILATATION & CURETTAGE/HYSTEROSCOPY;  Surgeon: Glennon Almarie POUR, MD;  Location[facility];  Service: Gynecology;  Laterality: N/A;  Myosure and ECC   EXCISION OF KELOID N/A 05/21/2023   Procedure: EXCISION CHEST WALL SCAR;  Surgeon: [name], MD;  Location: [facility];  Service: General;  Laterality: N/A;   MYOSURE RESECTION  04/14/2024   Procedure: MYOSURE RESECTION;  Surgeon: Glennon Almarie POUR, MD;  Location: [facility] OR;  Service: Gynecology;;   PORTACATH PLACEMENT N/A 10/23/2022   Procedure: INSERTION PORT-A-CATH;  Surgeon: [name], MD;  Location: [facility];  Service: General;  Laterality: N/A;    Family History family history includes Breast cancer (age of onset: 31 - 28) in her sister; Cancer in her paternal grandmother; Multiple myeloma in her father.  Social History Social History   Socioeconomic History   Marital status: Single    Spouse name: Not on file  Number of children: Not on file   Years of education: Not on file   Highest education level: GED or equivalent  Occupational History   Not on file  Tobacco Use   Smoking status: Never   Smokeless tobacco: Never  Vaping Use   Vaping status: Never Used  Substance and Sexual Activity   Alcohol use: Not Currently   Drug use: No   Sexual activity: Not Currently    Partners: Male    Birth control/protection: Post-menopausal, Abstinence  Other Topics Concern   Not on file  Social History Narrative   Not on file   Social Drivers of Health   Financial Resource Strain: Medium Risk (03/12/2024)   Overall Financial Resource Strain (CARDIA)    Difficulty of Paying Living Expenses: Somewhat hard  Food Insecurity: No Food Insecurity (03/12/2024)   Hunger Vital Sign    Worried About  Running Out of Food in the Last Year: Never true    Ran Out of Food in the Last Year: Never true  Recent Concern: Food Insecurity - Food Insecurity Present (12/18/2023)   Hunger Vital Sign    Worried About Running Out of Food in the Last Year: Sometimes true    Ran Out of Food in the Last Year: Sometimes true  Transportation Needs: Unmet Transportation Needs (03/12/2024)   PRAPARE - Transportation    Lack of Transportation (Medical): Yes    Lack of Transportation (Non-Medical): Yes  Physical Activity: Inactive (03/12/2024)   Exercise Vital Sign    Days of Exercise per Week: 0 days    Minutes of Exercise per Session: Not on file  Stress: Stress Concern Present (03/12/2024)   [employer] of Occupational Health - Occupational Stress Questionnaire    Feeling of Stress: Very much  Social Connections: Moderately Isolated (03/12/2024)   Social Connection and Isolation Panel    Frequency of Communication with Friends and Family: Three times a week    Frequency of Social Gatherings with Friends and Family: Never    Attends Religious Services: Never    Database Administrator or Organizations: Yes    Attends Banker Meetings: Never    Marital Status: Never married  Intimate Partner Violence: Not on file    Laboratory Investigations Lab Results  Component Value Date   TSH 7.340 (H) 05/24/2024   TSH 6.99 (H) 03/18/2024   TSH 14.700 (H) 02/08/2024   FREET4 0.9 07/07/2024   FREET4 0.75 11/26/2023   FREET4 0.39 (L) 03/16/2023     No results found for: TSI   No components found for: TRAB   Lab Results  Component Value Date   CHOL 194 05/09/2010   Lab Results  Component Value Date   HDL 53 05/09/2010   Lab Results  Component Value Date   LDLCALC 129 (H) 05/09/2010   Lab Results  Component Value Date   TRIG 61 05/09/2010   Lab Results  Component Value Date   CHOLHDL 3.7 Ratio 05/09/2010   Lab Results  Component Value Date   CREATININE 0.70 07/05/2024    Lab Results  Component Value Date   GFR 100.68 03/18/2024      Component Value Date/Time   NA 139 07/05/2024 0107   K 3.9 07/05/2024 0107   CL 104 07/05/2024 0107   CO2 26 07/05/2024 0107   GLUCOSE 126 (H) 07/05/2024 0107   BUN 17 07/05/2024 0107   CREATININE 0.70 07/05/2024 0107   CREATININE 0.69 05/24/2024 1334   CALCIUM 9.4 07/05/2024 0107  PROT 7.7 07/05/2024 0107   ALBUMIN 4.3 07/05/2024 0107   AST 21 07/05/2024 0107   AST 17 05/24/2024 1334   ALT 12 07/05/2024 0107   ALT 12 05/24/2024 1334   ALKPHOS 112 07/05/2024 0107   BILITOT 0.3 07/05/2024 0107   BILITOT 0.4 05/24/2024 1334   GFRNONAA >60 07/05/2024 0107   GFRNONAA >60 05/24/2024 1334   GFRAA >60 12/23/2015 1151      Latest Ref Rng & Units 07/05/2024    1:07 AM 05/24/2024    1:34 PM 03/18/2024   11:57 AM  BMP  Glucose 70 - 99 mg/dL 873  894  896   BUN 6 - 20 mg/dL 17  14  13    Creatinine 0.44 - 1.00 mg/dL 9.29  9.30  9.32   Sodium 135 - 145 mmol/L 139  139  137   Potassium 3.5 - 5.1 mmol/L 3.9  3.9  4.0   Chloride 98 - 111 mmol/L 104  104  101   CO2 22 - 32 mmol/L 26  31  29    Calcium 8.9 - 10.3 mg/dL 9.4  9.3  9.8        Component Value Date/Time   WBC 3.4 (L) 07/05/2024 0107   RBC 4.05 07/05/2024 0107   HGB 11.2 (L) 07/05/2024 0107   HGB 11.0 (L) 05/24/2024 1334   HCT 36.2 07/05/2024 0107   PLT 149 (L) 07/05/2024 0107   PLT 165 05/24/2024 1334   MCV 89.4 07/05/2024 0107   MCH 27.7 07/05/2024 0107   MCHC 30.9 07/05/2024 0107   RDW 12.8 07/05/2024 0107   LYMPHSABS 0.8 05/24/2024 1334   MONOABS 0.3 05/24/2024 1334   EOSABS 0.0 05/24/2024 1334   BASOSABS 0.0 05/24/2024 1334      Parts of this note may have been dictated using voice recognition software. There may be variances in spelling and vocabulary which are unintentional. Not all errors are proofread. Please notify the dino if any discrepancies are noted or if the meaning of any statement is not clear.

## 2024-07-25 ENCOUNTER — Inpatient Hospital Stay

## 2024-07-25 ENCOUNTER — Ambulatory Visit (INDEPENDENT_AMBULATORY_CARE_PROVIDER_SITE_OTHER): Admitting: Family Medicine

## 2024-07-25 ENCOUNTER — Encounter: Payer: Self-pay | Admitting: Family Medicine

## 2024-07-25 VITALS — BP 120/84 | HR 70 | Temp 97.6°F | Ht 63.0 in | Wt 181.0 lb

## 2024-07-25 DIAGNOSIS — E039 Hypothyroidism, unspecified: Secondary | ICD-10-CM

## 2024-07-25 DIAGNOSIS — M5432 Sciatica, left side: Secondary | ICD-10-CM | POA: Diagnosis not present

## 2024-07-25 DIAGNOSIS — F32A Depression, unspecified: Secondary | ICD-10-CM

## 2024-07-25 DIAGNOSIS — Z170 Estrogen receptor positive status [ER+]: Secondary | ICD-10-CM | POA: Diagnosis not present

## 2024-07-25 DIAGNOSIS — Z452 Encounter for adjustment and management of vascular access device: Secondary | ICD-10-CM | POA: Diagnosis not present

## 2024-07-25 DIAGNOSIS — Z923 Personal history of irradiation: Secondary | ICD-10-CM | POA: Diagnosis not present

## 2024-07-25 DIAGNOSIS — F419 Anxiety disorder, unspecified: Secondary | ICD-10-CM | POA: Diagnosis not present

## 2024-07-25 DIAGNOSIS — C50411 Malignant neoplasm of upper-outer quadrant of right female breast: Secondary | ICD-10-CM | POA: Diagnosis not present

## 2024-07-25 DIAGNOSIS — Z79810 Long term (current) use of selective estrogen receptor modulators (SERMs): Secondary | ICD-10-CM | POA: Diagnosis not present

## 2024-07-25 MED ORDER — KETOROLAC TROMETHAMINE 60 MG/2ML IM SOLN
60.0000 mg | Freq: Once | INTRAMUSCULAR | Status: AC
  Administered 2024-07-25: 60 mg via INTRAMUSCULAR

## 2024-07-25 MED ORDER — MELOXICAM 15 MG PO TABS: 15.0000 mg | ORAL_TABLET | Freq: Every day | ORAL | 0 refills | Status: AC

## 2024-07-25 NOTE — Patient Instructions (Addendum)
 You received Toradol  60 mg IM injection in the office today.  This is an anti-inflammatory pain medicine.  If you need additional pain medication today, you can take Tylenol  500 mg or 1000 mg twice daily.  Start the meloxicam  tomorrow.  Take this with food and plenty of water.  This is an anti-inflammatory pain medicine.  Do the stretching for sciatica  Let us  know if you have any new or worsening symptoms or if you are not improving in the next 2 weeks.

## 2024-07-25 NOTE — Progress Notes (Signed)
 Subjective:     Patient ID: Maria Carter, female    DOB: 10-Mar-1971, 53 y.o.   MRN: [id number]  Chief Complaint  Patient presents with   sciatica pain    Left sided pain starting in glute radiating down leg, for the last couple weeks. limiting walking    HPI  Discussed the use of AI scribe software for clinical note transcription with the patient, who gave verbal consent to proceed.  History of Present Illness Maria Carter is a 53 year old female who presents with left-sided low back pain and sciatica type pain for the past two weeks.  Left-sided low back pain with sciatica - Onset two weeks ago - Pain originates in the left buttock and radiates down to the left foot - Severely limits ambulation - Pain worsens upon rising from a seated or lying position - Heating pads provide partial relief - No prior episodes of similar pain  Mood disturbance - Depression and anxiety have worsened following her sister's passing - Currently on medication for mood disorders - Attends therapy regularly - No suicidal ideation  Thyroid  dysfunction and medication effects - Takes levothyroxine  for thyroid  dysfunction which is managed by endocrinology  - Recent dosage increase resulted in racing heartbeat, nervousness, and right cheek twitching - Dosage adjusted back to 50 mcg with improvement in symptoms - Previously experienced fatigue, moodiness, and cold intolerance when not taking medication         07/25/2024   10:37 AM 05/24/2024    1:00 PM 01/20/2024    9:03 AM 12/01/2023   11:08 AM 02/28/2022   10:14 AM  Depression screen PHQ 2/9  Decreased Interest 3 3 3 3 3   Down, Depressed, Hopeless 3 3 3 3 3   PHQ - 2 Score 6 6 6 6 6   Altered sleeping 2 1 2 2 2   Tired, decreased energy 3 2 3 3 3   Change in appetite 0 0 1 0 1  Feeling bad or failure about yourself  3 1 3 3 3   Trouble concentrating 3 3 3 3 3   Moving slowly or fidgety/restless 1 0 2 3 2   Suicidal thoughts 0 0 0 0 0   PHQ-9 Score 18 13  20  20  20    Difficult doing work/chores Very difficult  Somewhat difficult       Data saved with a previous flowsheet row definition       07/25/2024   10:37 AM 12/01/2023   11:09 AM  GAD 7 : Generalized Anxiety Score  Nervous, Anxious, on Edge 3 2  Control/stop worrying 3 3  Worry too much - different things 3 3  Trouble relaxing 3 3  Restless 2 2  Easily annoyed or irritable 3 3  Afraid - awful might happen 3 3  Total GAD 7 Score 20 19  Anxiety Difficulty Very difficult        Health Maintenance Due  Topic Date Due   Medicare Annual Wellness (AWV)  Never done   DTaP/Tdap/Td (1 - Tdap) Never done   Pneumococcal Vaccine: 50+ Years (1 of 2 - PCV) Never done   Hepatitis B Vaccines 19-59 Average Risk (1 of 3 - 19+ 3-dose series) Never done   Zoster Vaccines- Shingrix (1 of 2) Never done    Past Medical History:  Diagnosis Date   Anemia    Anxiety    Breast cancer (HCC)    right breast IDC   Depression    Hx of febrile  seizure    1980   Hypothyroidism    Osteopenia    2025 -1.8 frax neg. repeat in 2 years    Past Surgical History:  Procedure Laterality Date   BREAST BIOPSY Right 09/30/2022   US  RT BREAST BX W LOC DEV 1ST LESION IMG BX SPEC US  GUIDE 09/30/2022 [facility]   BREAST BIOPSY  05/20/2023   MM RT RADIOACTIVE SEED LOC MAMMO GUIDE 05/20/2023 [facility]   BREAST LUMPECTOMY Right 05/21/2023   BREAST LUMPECTOMY WITH RADIOACTIVE SEED AND SENTINEL LYMPH NODE BIOPSY Right 05/21/2023   Procedure: RIGHT BREAST LUMPECTOMY WITH RADIOACTIVE SEED AND SENTINEL LYMPH NODE BIOPSY;  Surgeon: [name], MD;  Location: [facility];  Service: General;  Laterality: Right;  LMA PEC BLOCK   DILATATION & CURRETTAGE/HYSTEROSCOPY WITH RESECTOCOPE N/A 04/14/2024   Procedure: DILATATION & CURETTAGE/HYSTEROSCOPY;  Surgeon: Glennon Almarie POUR, MD;  Location: [facility] OR;  Service: Gynecology;  Laterality: N/A;  Myosure and ECC    EXCISION OF KELOID N/A 05/21/2023   Procedure: EXCISION CHEST WALL SCAR;  Surgeon: [name], MD;  Location: [facility];  Service: General;  Laterality: N/A;   MYOSURE RESECTION  04/14/2024   Procedure: MYOSURE RESECTION;  Surgeon: Glennon Almarie POUR, MD;  Location: [facility] OR;  Service: Gynecology;;   PORTACATH PLACEMENT N/A 10/23/2022   Procedure: INSERTION PORT-A-CATH;  Surgeon: [name], MD;  Location: [facility];  Service: General;  Laterality: N/A;    Family History  Problem Relation Age of Onset   Multiple myeloma Father    Breast cancer Sister 43 - 65   Cancer Paternal Grandmother        unknown type   Colon polyps Neg Hx    Colon cancer Neg Hx    Esophageal cancer Neg Hx    Rectal cancer Neg Hx    Stomach cancer Neg Hx     Social History   Socioeconomic History   Marital status: Single    Spouse name: Not on file   Number of children: Not on file   Years of education: Not on file   Highest education level: GED or equivalent  Occupational History   Not on file  Tobacco Use   Smoking status: Never   Smokeless tobacco: Never  Vaping Use   Vaping status: Never Used  Substance and Sexual Activity   Alcohol use: Not Currently   Drug use: No   Sexual activity: Not Currently    Partners: Male    Birth control/protection: Post-menopausal, Abstinence  Other Topics Concern   Not on file  Social History Narrative   Not on file   Social Drivers of Health   Financial Resource Strain: Medium Risk (03/12/2024)   Overall Financial Resource Strain (CARDIA)    Difficulty of Paying Living Expenses: Somewhat hard  Food Insecurity: No Food Insecurity (03/12/2024)   Hunger Vital Sign    Worried About Running Out of Food in the Last Year: Never true    Ran Out of Food in the Last Year: Never true  Recent Concern: Food Insecurity - Food Insecurity Present (12/18/2023)   Hunger Vital Sign    Worried About Running Out of Food in the Last  Year: Sometimes true    Ran Out of Food in the Last Year: Sometimes true  Transportation Needs: Unmet Transportation Needs (03/12/2024)   PRAPARE - Transportation    Lack of Transportation (Medical): Yes    Lack of Transportation (Non-Medical): Yes  Physical Activity: Inactive (03/12/2024)  Exercise Vital Sign    Days of Exercise per Week: 0 days    Minutes of Exercise per Session: Not on file  Stress: Stress Concern Present (03/12/2024)   [employer] of Occupational Health - Occupational Stress Questionnaire    Feeling of Stress: Very much  Social Connections: Moderately Isolated (03/12/2024)   Social Connection and Isolation Panel    Frequency of Communication with Friends and Family: Three times a week    Frequency of Social Gatherings with Friends and Family: Never    Attends Religious Services: Never    Database Administrator or Organizations: Yes    Attends Banker Meetings: Never    Marital Status: Never married  Catering Manager Violence: Not on file    Outpatient Medications Prior to Visit  Medication Sig Dispense Refill   acetaminophen  (TYLENOL ) 325 MG tablet Take 650 mg by mouth every 6 (six) hours as needed.     ALPRAZolam (XANAX) 0.25 MG tablet Take 1 tab  daily  prn anxiety     busPIRone (BUSPAR) 15 MG tablet Take by mouth.     desvenlafaxine (PRISTIQ) 50 MG 24 hr tablet Take 50 mg by mouth daily.     ergocalciferol  (VITAMIN D2) 1.25 MG (50000 UT) capsule Take 1 capsule (50,000 Units total) by mouth once a week. 12 capsule 1   gabapentin  (NEURONTIN ) 300 MG capsule Take 1 capsule (300 mg total) by mouth at bedtime. 30 capsule 1   ibuprofen  (ADVIL ) 800 MG tablet Take 1 tablet (800 mg total) by mouth every 8 (eight) hours as needed. 30 tablet 1   levothyroxine  (SYNTHROID ) 50 MCG tablet Take 1 tablet (50 mcg total) by mouth daily. 90 tablet 3   metoCLOPramide  (REGLAN ) 10 MG tablet Take 1 tablet (10 mg total) by mouth every 8 (eight) hours as needed. 5  tablet 0   Multiple Vitamins-Minerals (MULTIVITAMIN ADULTS PO) Take 1 tablet by mouth daily.     tamoxifen  (NOLVADEX ) 20 MG tablet Take 1 tablet (20 mg total) by mouth daily. 90 tablet 1   No facility-administered medications prior to visit.    No Known Allergies  Review of Systems  Constitutional:  Negative for chills and fever.  Respiratory:  Negative for shortness of breath.   Cardiovascular:  Negative for chest pain, palpitations and leg swelling.  Gastrointestinal:  Negative for abdominal pain, constipation, diarrhea, nausea and vomiting.  Genitourinary:  Negative for dysuria, frequency and urgency.  Musculoskeletal:  Positive for back pain.       Left buttock pain shooting into LLE  Neurological:  Negative for dizziness, tingling, focal weakness and headaches.  Psychiatric/Behavioral:  Positive for depression. Negative for suicidal ideas. The patient is nervous/anxious.        Objective:    Physical Exam Constitutional:      General: She is not in acute distress.    Appearance: She is not ill-appearing.  HENT:     Mouth/Throat:     Mouth: Mucous membranes are moist.     Pharynx: Oropharynx is clear.  Eyes:     Extraocular Movements: Extraocular movements intact.     Conjunctiva/sclera: Conjunctivae normal.  Cardiovascular:     Rate and Rhythm: Normal rate.  Pulmonary:     Effort: Pulmonary effort is normal.  Musculoskeletal:     Cervical back: Normal range of motion and neck supple.     Right lower leg: No edema.     Left lower leg: No edema.     Comments:  Left SI joint pain with movement. LLE is neurovascularly intact. ROM and strength normal.   Skin:    General: Skin is warm and dry.     Findings: No rash.  Neurological:     General: No focal deficit present.     Mental Status: She is alert and oriented to person, place, and time.     Motor: No weakness.     Coordination: Coordination normal.     Gait: Gait normal.  Psychiatric:        Mood and Affect:  Mood normal.        Behavior: Behavior normal.        Thought Content: Thought content normal.      BP 120/84   Pulse 70   Temp 97.6 F (36.4 C) (Temporal)   Ht 5' 3 (1.6 m)   Wt 181 lb (82.1 kg)   LMP  (LMP Unknown)   SpO2 100%   BMI 32.06 kg/m  Wt Readings from Last 3 Encounters:  07/25/24 181 lb (82.1 kg)  07/19/24 182 lb (82.6 kg)  05/30/24 177 lb (80.3 kg)       Assessment & Plan:   Problem List Items Addressed This Visit     Acquired hypothyroidism   Other Visit Diagnoses       Sciatica of left side    -  Primary   Relevant Medications   meloxicam  (MOBIC ) 15 MG tablet   ketorolac  (TORADOL ) injection 60 mg (Completed)     Anxiety and depression           Assessment and Plan Assessment & Plan Left-sided sciatica Pain radiating from the left buttock to the foot, exacerbated by movement, consistent with sciatica. No low back pain. No red flag symptoms or signs of infection, PAD or DVT.  Reassured patient symptoms are not consistent with her sister's condition, which involved diabetes and peripheral artery disease. - Administered Toradol  injection for immediate pain relief. - Recommended use of heating pad and topical pain relief such as Salonpas with lidocaine . - Provided sciatica rehabilitation exercises for stretching and strengthening. - Prescribed meloxicam  for 7-10 days, to be taken with food starting the day after the Toradol  injection. - Advised use of Tylenol  for additional pain relief if needed. - If symptoms do not improve in a couple of weeks, will consider referral to physical therapy or imaging.  Anxiety disorder and depression Currently managed with medication and therapy. No suicidal ideation reported. - Continue current medication and therapy regimen.  Hypothyroidism Recent adjustment in levothyroxine  dosage led to symptoms of tachycardia, jitteriness, and facial twitching. Dosage was reverted to previous level, resulting in symptom  improvement. Thyroid  function tests showed slightly elevated levels, but not as high as previously. - Continue current levothyroxine  dosage of 50 mcg daily. - Ensure labs are done before next endocrinology appointment. - reviewed endocrinology notes and recent lab results.      I am having Jon EMERSON Maxcy start on meloxicam . I am also having her maintain her Multiple Vitamins-Minerals (MULTIVITAMIN ADULTS PO), busPIRone, tamoxifen , ALPRAZolam, desvenlafaxine, ergocalciferol , metoCLOPramide , ibuprofen , acetaminophen , gabapentin , and levothyroxine . We administered ketorolac .  Meds ordered this encounter  Medications   meloxicam  (MOBIC ) 15 MG tablet    Sig: Take 1 tablet (15 mg total) by mouth daily.    Dispense:  30 tablet    Refill:  0    Supervising Provider:   ROLLENE NORRIS A [[id number]]   ketorolac  (TORADOL ) injection 60 mg

## 2024-08-29 ENCOUNTER — Other Ambulatory Visit

## 2024-08-31 ENCOUNTER — Ambulatory Visit: Admitting: "Endocrinology

## 2024-09-09 ENCOUNTER — Other Ambulatory Visit (HOSPITAL_COMMUNITY)
Admission: RE | Admit: 2024-09-09 | Discharge: 2024-09-09 | Disposition: A | Source: Ambulatory Visit | Attending: Internal Medicine | Admitting: Internal Medicine

## 2024-09-09 ENCOUNTER — Ambulatory Visit: Admitting: Family Medicine

## 2024-09-09 ENCOUNTER — Ambulatory Visit: Admitting: Internal Medicine

## 2024-09-09 VITALS — BP 116/78 | HR 75 | Temp 97.6°F | Resp 16 | Ht 63.0 in | Wt 182.0 lb

## 2024-09-09 DIAGNOSIS — E039 Hypothyroidism, unspecified: Secondary | ICD-10-CM

## 2024-09-09 DIAGNOSIS — L821 Other seborrheic keratosis: Secondary | ICD-10-CM | POA: Diagnosis not present

## 2024-09-09 DIAGNOSIS — R739 Hyperglycemia, unspecified: Secondary | ICD-10-CM

## 2024-09-09 DIAGNOSIS — D696 Thrombocytopenia, unspecified: Secondary | ICD-10-CM

## 2024-09-09 DIAGNOSIS — E785 Hyperlipidemia, unspecified: Secondary | ICD-10-CM

## 2024-09-09 DIAGNOSIS — L989 Disorder of the skin and subcutaneous tissue, unspecified: Secondary | ICD-10-CM | POA: Insufficient documentation

## 2024-09-09 DIAGNOSIS — E519 Thiamine deficiency, unspecified: Secondary | ICD-10-CM | POA: Diagnosis not present

## 2024-09-09 LAB — CBC WITH DIFFERENTIAL/PLATELET
Basophils Absolute: 0 K/uL (ref 0.0–0.1)
Basophils Relative: 0.5 % (ref 0.0–3.0)
Eosinophils Absolute: 0 K/uL (ref 0.0–0.7)
Eosinophils Relative: 1.1 % (ref 0.0–5.0)
HCT: 37.4 % (ref 36.0–46.0)
Hemoglobin: 12.2 g/dL (ref 12.0–15.0)
Lymphocytes Relative: 24.3 % (ref 12.0–46.0)
Lymphs Abs: 0.6 K/uL — ABNORMAL LOW (ref 0.7–4.0)
MCHC: 32.5 g/dL (ref 30.0–36.0)
MCV: 87.6 fl (ref 78.0–100.0)
Monocytes Absolute: 0.2 K/uL (ref 0.1–1.0)
Monocytes Relative: 9.2 % (ref 3.0–12.0)
Neutro Abs: 1.7 K/uL (ref 1.4–7.7)
Neutrophils Relative %: 64.9 % (ref 43.0–77.0)
Platelets: 148 K/uL — ABNORMAL LOW (ref 150.0–400.0)
RBC: 4.27 Mil/uL (ref 3.87–5.11)
RDW: 13.7 % (ref 11.5–15.5)
WBC: 2.6 K/uL — ABNORMAL LOW (ref 4.0–10.5)

## 2024-09-09 LAB — LIPID PANEL
Cholesterol: 210 mg/dL — ABNORMAL HIGH (ref 28–200)
HDL: 59.4 mg/dL
LDL Cholesterol: 139 mg/dL — ABNORMAL HIGH (ref 10–99)
NonHDL: 151.01
Total CHOL/HDL Ratio: 4
Triglycerides: 59 mg/dL (ref 10.0–149.0)
VLDL: 11.8 mg/dL (ref 0.0–40.0)

## 2024-09-09 LAB — TSH: TSH: 1.55 u[IU]/mL (ref 0.35–5.50)

## 2024-09-09 LAB — HEMOGLOBIN A1C: Hgb A1c MFr Bld: 6.2 % (ref 4.6–6.5)

## 2024-09-09 NOTE — Patient Instructions (Signed)
 Removing a Small Tissue of Skin for Testing (Skin Biopsy): What to Expect A skin biopsy is removing a small tissue of your skin so that it can be tested in the lab. This is usually done to diagnose skin conditions or abnormal changes on your skin (lesion). You may need skin biopsy if you have a skin disease or skin lesion. Tell a health care provider about: Any allergies you have. All medicines you take. These include vitamins, herbs, eye drops, and creams. Any problems you or family members have had with anesthesia. Any bleeding problems you have. Any surgeries you've had. Any medical conditions you have. Whether you're pregnant or may be pregnant. What are the risks? Your provider will talk with you about risks. These may include: Bleeding. Infection. Scarring. Allergies to ointments, anesthesia, or materials used in surgery. What happens before? Medicines Ask about changing or stopping: Any medicines you take. Any vitamins, herbs, or supplements you take. Do not take aspirin or ibuprofen  unless you're told to. Surgery safety For your safety, you may: Need to wash your skin with a soap that kills germs. Get antibiotics. Have your surgery site marked. Have hair removed at the surgery site. General instructions Do not smoke, vape, or use nicotine or tobacco for at least 4 weeks before the surgery. Eat and drink only as told. Ask your health care provider if you'll need someone to take you home from the hospital or clinic. What happens during a skin biopsy?  You'll be given anesthesia to numb the area. Your provider will take a sample using one of these steps. This depends on the type of skin problem that you have. Shave biopsy. Layers of skin lesion will be shaved away with a sharp blade. After shaving, a gel or ointment may be used to stop bleeding. Punch biopsy. All or part of the lesion will be removed using a surgical tool. This leaves a small hole. The hole may be covered  with a gel or ointment. Excisional or incisional biopsy. All or part of the lesion will be removed using a surgical blade. Your skin biopsy site may be closed with stitches. A bandage may be put over the wound. These steps may vary. Ask what you can expect. What happens after? A sample will be sent to a lab for testing. Your skin biopsy site will be watched to make sure that bleeding stops. Talk with your provider about your test results or treatment options. Ask if you need to have more tests. This information is not intended to replace advice given to you by your health care provider. Make sure you discuss any questions you have with your health care provider. Document Revised: 11/19/2023 Document Reviewed: 10/22/2023 Elsevier Patient Education  2025 [employer].

## 2024-09-09 NOTE — Progress Notes (Unsigned)
 "         Subjective:  Patient ID: Maria Carter, female    DOB: 1970-10-02  Age: 54 y.o. MRN: [id number]  CC: Anemia   HPI AMYRI FRENZ presents for f/up ---  Discussed the use of AI scribe software for clinical note transcription with the patient, who gave verbal consent to proceed.  History of Present Illness Maria Carter is a 54 year old female who presents with enlarging and itching lesions on her legs.  She has had lesions on her legs for a long time, which have been enlarging over the past month or two. The lesions are described as itchy, and she has been applying cocoa butter or lotion to alleviate dryness and itching.  She has a history of breast cancer, an underactive thyroid  gland, slightly elevated blood sugar, anemia, and a low platelet count. She experiences fatigue, lack of energy, and occasional nausea.  No chest pain, shortness of breath, easy bleeding, or bruising.     Outpatient Medications Prior to Visit  Medication Sig Dispense Refill   acetaminophen  (TYLENOL ) 325 MG tablet Take 650 mg by mouth every 6 (six) hours as needed.     ALPRAZolam (XANAX) 0.25 MG tablet Take 1 tab  daily  prn anxiety     busPIRone (BUSPAR) 15 MG tablet Take by mouth.     desvenlafaxine (PRISTIQ) 50 MG 24 hr tablet Take 50 mg by mouth daily.     ergocalciferol  (VITAMIN D2) 1.25 MG (50000 UT) capsule Take 1 capsule (50,000 Units total) by mouth once a week. 12 capsule 1   gabapentin  (NEURONTIN ) 300 MG capsule Take 1 capsule (300 mg total) by mouth at bedtime. 30 capsule 1   ibuprofen  (ADVIL ) 800 MG tablet Take 1 tablet (800 mg total) by mouth every 8 (eight) hours as needed. 30 tablet 1   levothyroxine  (SYNTHROID ) 50 MCG tablet Take 1 tablet (50 mcg total) by mouth daily. 90 tablet 3   meloxicam  (MOBIC ) 15 MG tablet Take 1 tablet (15 mg total) by mouth daily. 30 tablet 0   metoCLOPramide  (REGLAN ) 10 MG tablet Take 1 tablet (10 mg total) by mouth every 8 (eight) hours as  needed. 5 tablet 0   Multiple Vitamins-Minerals (MULTIVITAMIN ADULTS PO) Take 1 tablet by mouth daily.     tamoxifen  (NOLVADEX ) 20 MG tablet Take 1 tablet (20 mg total) by mouth daily. 90 tablet 1   No facility-administered medications prior to visit.    ROS Review of Systems  Constitutional:  Positive for fatigue. Negative for chills and unexpected weight change.  HENT: Negative.    Eyes: Negative.   Respiratory: Negative.  Negative for cough, chest tightness, shortness of breath and wheezing.   Cardiovascular:  Negative for chest pain, palpitations and leg swelling.  Gastrointestinal: Negative.  Negative for abdominal pain, diarrhea, nausea and vomiting.  Endocrine: Negative.   Genitourinary: Negative.   Musculoskeletal: Negative.  Negative for myalgias.  Skin: Negative.  Negative for rash.  Neurological: Negative.  Negative for dizziness.  Hematological:  Negative for adenopathy. Does not bruise/bleed easily.  Psychiatric/Behavioral:  Positive for dysphoric mood.     Objective:  BP 116/78 (BP Location: Right Arm, Patient Position: Sitting, Cuff Size: Normal)   Pulse 75   Temp 97.6 F (36.4 C) (Oral)   Resp 16   Ht 5' 3 (1.6 m)   LMP  (LMP Unknown)   SpO2 99%   BMI 32.24 kg/m   BP Readings from Last 3 Encounters:  09/09/24 116/78  09/09/24 104/76  07/25/24 120/84    Wt Readings from Last 3 Encounters:  09/09/24 182 lb (82.6 kg)  07/25/24 181 lb (82.1 kg)  07/19/24 182 lb (82.6 kg)    Physical Exam Vitals reviewed.  Constitutional:      Appearance: Normal appearance.  HENT:     Nose: Nose normal.     Mouth/Throat:     Mouth: Mucous membranes are moist.  Eyes:     General: No scleral icterus.    Conjunctiva/sclera: Conjunctivae normal.  Cardiovascular:     Rate and Rhythm: Normal rate and regular rhythm.     Heart sounds: No murmur heard.    No friction rub. No gallop.  Pulmonary:     Effort: Pulmonary effort is normal.     Breath sounds: No stridor.  No wheezing, rhonchi or rales.  Abdominal:     General: Abdomen is flat.     Palpations: There is no mass.     Tenderness: There is no abdominal tenderness. There is no guarding.     Hernia: No hernia is present.  Musculoskeletal:     Cervical back: Neck supple.  Lymphadenopathy:     Cervical: No cervical adenopathy.  Skin:    General: Skin is warm and dry.     Findings: Lesion present.     Comments: Multiple hyperkeratotic lesions noted over both ankles and feet.  Neurological:     General: No focal deficit present.     Mental Status: She is alert.     Lab Results  Component Value Date   WBC 3.4 (L) 07/05/2024   HGB 11.2 (L) 07/05/2024   HCT 36.2 07/05/2024   PLT 149 (L) 07/05/2024   GLUCOSE 126 (H) 07/05/2024   CHOL 194 05/09/2010   TRIG 61 05/09/2010   HDL 53 05/09/2010   LDLCALC 129 (H) 05/09/2010   ALT 12 07/05/2024   AST 21 07/05/2024   NA 139 07/05/2024   K 3.9 07/05/2024   CL 104 07/05/2024   CREATININE 0.70 07/05/2024   BUN 17 07/05/2024   CO2 26 07/05/2024   TSH 7.340 (H) 05/24/2024    CT Angio Chest PE W/Cm &/Or Wo Cm Result Date: 07/05/2024 CLINICAL DATA:  Seizure with general body aches and chest pain for 6 days. EXAM: CT ANGIOGRAPHY CHEST WITH CONTRAST TECHNIQUE: Multidetector CT imaging of the chest was performed using the standard protocol during bolus administration of intravenous contrast. Multiplanar CT image reconstructions and MIPs were obtained to evaluate the vascular anatomy. RADIATION DOSE REDUCTION: This exam was performed according to the departmental dose-optimization program which includes automated exposure control, adjustment of the mA and/or kV according to patient size and/or use of iterative reconstruction technique. CONTRAST:  75mL OMNIPAQUE  IOHEXOL  350 MG/ML SOLN COMPARISON:  None Available. FINDINGS: Cardiovascular: A left-sided venous Port-A-Cath is in place. Satisfactory opacification of the pulmonary arteries to the segmental level.  No evidence of pulmonary embolism. Normal heart size. No pericardial effusion. Mediastinum/Nodes: No enlarged mediastinal, hilar, or axillary lymph nodes. Thyroid  gland, trachea, and esophagus demonstrate no significant findings. Lungs/Pleura: Mild dependent atelectasis is seen within the posterior aspect of both lungs. No acute infiltrate, pleural effusion or pneumothorax is identified. Upper Abdomen: No acute abnormality. Musculoskeletal: No chest wall abnormality. No acute or significant osseous findings. Review of the MIP images confirms the above findings. IMPRESSION: No evidence of pulmonary embolism or other acute intrathoracic process. Electronically Signed   By: Suzen Dials M.D.   On: 07/05/2024 03:48  CT Head Wo Contrast Result Date: 07/05/2024 EXAM: CT HEAD WITHOUT CONTRAST 07/05/2024 03:31:05 AM TECHNIQUE: CT of the head was performed without the administration of intravenous contrast. Automated exposure control, iterative reconstruction, and/or weight based adjustment of the mA/kV was utilized to reduce the radiation dose to as low as reasonably achievable. COMPARISON: None available. CLINICAL HISTORY: Seizure, new-onset, no history of trauma; left facial twitching, left foot numbness for several days - hx/o breast ca FINDINGS: Limited study due to streak artifact from hair implants. Within this limitation: BRAIN AND VENTRICLES: No acute hemorrhage. No evidence of acute infarct. No hydrocephalus. No extra-axial collection. No mass effect or midline shift. ORBITS: No acute abnormality. SINUSES: No acute abnormality. SOFT TISSUES AND SKULL: No acute soft tissue abnormality. No skull fracture. IMPRESSION: 1. Limited study without evidence of acute intracranial abnormality. Electronically signed by: Gilmore Molt MD 07/05/2024 03:38 AM EST RP Workstation: [id number]   DG Chest 2 View Result Date: 07/04/2024 CLINICAL DATA:  Mid chest pain for 3 days EXAM: CHEST - 2 VIEW COMPARISON:  12/16/2021  FINDINGS: Frontal and lateral views of the chest demonstrate left chest wall port tip within the superior vena cava. The cardiac silhouette is unremarkable. No airspace disease, effusion, or pneumothorax. No acute bony abnormalities. IMPRESSION: 1. No acute intrathoracic process. Electronically Signed   By: Ozell Daring M.D.   On: 07/04/2024 20:48   Lesion above right medial malleolus was biopsied -- After informed verbal consent was obtained, using Betadine  for cleansing and 2% Lidocaine  with epinephrine  for anesthetic (1 cc was used), with sterile technique a 6 mm punch biopsy was used to obtain a biopsy specimen of the lesion. Hemostasis was obtained by pressure and the wound was sutured , 2 simple interrupted sutures with 4.0 nylon. Antibiotic dressing is applied, and wound care instructions provided. Be alert for any signs of cutaneous infection. The specimen is labeled and sent to pathology for evaluation. The procedure was well tolerated without complications.   Assessment & Plan:  Acquired hypothyroidism -     TSH; Future  Thrombocytopenia -     CBC with Differential/Platelet; Future -     Vitamin B1; Future  Chronic hyperglycemia -     Hemoglobin A1c; Future  Dyslipidemia, goal LDL below 130 -     TSH; Future -     Lipid panel; Future  Skin lesions -     Surgical pathology     Follow-up: Return in about 3 months (around 12/08/2024).  Debby Molt, MD "

## 2024-09-09 NOTE — Progress Notes (Signed)
" ° °  Subjective:    Patient ID: Maria Carter, female    DOB: 1970/12/19, 54 y.o.   MRN: [id number]  HPI  Patient will be seen by Dr. Joshua due to procedure being performed.   Review of Systems     Objective:   Physical Exam        Assessment & Plan:    "

## 2024-09-11 ENCOUNTER — Ambulatory Visit: Payer: Self-pay | Admitting: Internal Medicine

## 2024-09-13 ENCOUNTER — Inpatient Hospital Stay: Attending: Physician Assistant

## 2024-09-13 ENCOUNTER — Telehealth: Payer: Self-pay

## 2024-09-13 DIAGNOSIS — Z170 Estrogen receptor positive status [ER+]: Secondary | ICD-10-CM | POA: Insufficient documentation

## 2024-09-13 DIAGNOSIS — Z1722 Progesterone receptor negative status: Secondary | ICD-10-CM | POA: Diagnosis not present

## 2024-09-13 DIAGNOSIS — L821 Other seborrheic keratosis: Secondary | ICD-10-CM | POA: Insufficient documentation

## 2024-09-13 DIAGNOSIS — Z452 Encounter for adjustment and management of vascular access device: Secondary | ICD-10-CM | POA: Diagnosis present

## 2024-09-13 DIAGNOSIS — Z1732 Human epidermal growth factor receptor 2 negative status: Secondary | ICD-10-CM | POA: Insufficient documentation

## 2024-09-13 DIAGNOSIS — E519 Thiamine deficiency, unspecified: Secondary | ICD-10-CM | POA: Insufficient documentation

## 2024-09-13 DIAGNOSIS — C50411 Malignant neoplasm of upper-outer quadrant of right female breast: Secondary | ICD-10-CM | POA: Diagnosis present

## 2024-09-13 LAB — VITAMIN B1: Vitamin B1 (Thiamine): 7 nmol/L — ABNORMAL LOW (ref 8–30)

## 2024-09-13 LAB — SURGICAL PATHOLOGY

## 2024-09-13 MED ORDER — VITAMIN B-1 50 MG PO TABS: 50.0000 mg | ORAL_TABLET | Freq: Every day | ORAL | 1 refills | Status: AC

## 2024-09-13 NOTE — Telephone Encounter (Signed)
 Please advise on biopsy results.

## 2024-09-13 NOTE — Telephone Encounter (Signed)
 Copied from CRM #[id number]. Topic: Clinical - Lab/Test Results >> Sep 13, 2024  2:03 PM Victoria A wrote:  Reason for CRM: Patient called to inquire about Biopsy results-please call patient  [phone]

## 2024-09-15 NOTE — Telephone Encounter (Unsigned)
 Copied from CRM [id number]. Topic: Appointments - Appointment Info/Confirmation >> Sep 15, 2024  2:04 PM Montie POUR wrote: Patient/patient representative is calling for information regarding an appointment.  Maria Carter is calling to schedule an appointment for tomorrow morning. This is in her chart from Dr. Joshua: The biopsy came back today. 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 The lesions look like warts but are not caused by a virus. They are benign extra layers of skin. I have ordered a dermatology referral. Please come back Friday to have the sutures removed.   No appointment available for tomorrow. Please call to discuss. Thanks Her number is [phone]

## 2024-09-15 NOTE — Telephone Encounter (Signed)
 Called pt and scheduled for tomorrow w Lauraine Pereyra

## 2024-09-16 ENCOUNTER — Ambulatory Visit (INDEPENDENT_AMBULATORY_CARE_PROVIDER_SITE_OTHER): Admitting: Nurse Practitioner

## 2024-09-16 VITALS — BP 122/80 | HR 69 | Temp 98.0°F | Ht 63.0 in | Wt 179.4 lb

## 2024-09-16 DIAGNOSIS — Z789 Other specified health status: Secondary | ICD-10-CM

## 2024-09-16 MED ORDER — MUPIROCIN 2 % EX OINT: 1.0000 | TOPICAL_OINTMENT | Freq: Two times a day (BID) | CUTANEOUS | 0 refills | Status: AC

## 2024-09-16 NOTE — Progress Notes (Addendum)
" ° °  Established Patient Office Visit  Subjective   Patient ID: Maria Carter, female    DOB: Sep 15, 1970  Age: 54 y.o. MRN: [id number]  Chief Complaint  Patient presents with   Suture / Staple Removal    Suture removal on the right lower leg    Discussed the use of AI scribe software for clinical note transcription with the patient, who gave verbal consent to proceed.  History of Present Illness Maria Carter is a 54 year old female who presents for suture removal following a procedure performed a week ago.  Post-biopsy site - patient underwent punch biopsy of right lower leg about 1 week ago - No pain, drainage, or bleeding since the procedure  Wound healing and activity concerns - Planning a trip in February - Inquires if wound will be sufficiently healed to allow swimming at that time       ROS: see HPI    Objective:     BP 122/80   Pulse 69   Temp 98 F (36.7 C) (Temporal)   Ht 5' 3 (1.6 m)   Wt 179 lb 6 oz (81.4 kg)   LMP  (LMP Unknown)   SpO2 96%   BMI 31.77 kg/m    Physical Exam Vitals reviewed.  Constitutional:      General: She is not in acute distress.    Appearance: Normal appearance.  HENT:     Head: Normocephalic and atraumatic.  Cardiovascular:     Rate and Rhythm: Normal rate.     Pulses: Normal pulses.  Pulmonary:     Effort: Pulmonary effort is normal.  Skin:    General: Skin is warm and dry.      Neurological:     General: No focal deficit present.     Mental Status: She is alert and oriented to person, place, and time.  Psychiatric:        Mood and Affect: Mood normal.        Behavior: Behavior normal.        Judgment: Judgment normal.      No results found for any visits on 09/16/24.    The 10-year ASCVD risk score (Arnett DK, et al., 2019) is: 1.9%    Assessment & Plan:   Problem List Items Addressed This Visit       Other   Presence of surgical incision - Primary   Relevant Medications   mupirocin  ointment  (BACTROBAN ) 2 %   Assessment and Plan Assessment & Plan Postoperative biopsy site care following suture removal Biopsy site healing well, no evidence of infection. No drainage or bleeding. Well approximated, expected to heal without complications. - 2 sutures removed using suture removal kit without complication. Site remained well approximated without reopening of the biopsy site, no drainage following suture removal. Entirety of both sutures removed successfully.  - Prescribed mupirocin  ointment twice daily to prevent infection. - Advised to keep wound clean, avoid hard scrubbing during showers. - Instructed to monitor for infection signs: swelling, redness, pain. - Advised that as long as healing progresses without evidence of infection or reopening of hte biopsy site that swimming should be safe by February 8th, 2026.    Return if symptoms worsen or fail to improve.    Lauraine FORBES Pereyra, NP  "

## 2024-09-16 NOTE — Assessment & Plan Note (Addendum)
 Postoperative biopsy site care following suture removal Biopsy site healing well, no evidence of infection. No drainage or bleeding. Well approximated, expected to heal without complications. - 2 sutures removed using suture removal kit without complication. Site remained well approximated without reopening of the biopsy site, no drainage following suture removal. Entirety of both sutures removed successfully.  - Prescribed mupirocin  ointment twice daily to prevent infection. - Advised to keep wound clean, avoid hard scrubbing during showers. - Instructed to monitor for infection signs: swelling, redness, pain. - Advised that as long as healing progresses without evidence of infection or reopening of hte biopsy site that swimming should be safe by February 8th, 2026.

## 2024-10-06 ENCOUNTER — Other Ambulatory Visit: Payer: Self-pay

## 2024-10-18 ENCOUNTER — Other Ambulatory Visit

## 2024-10-19 ENCOUNTER — Other Ambulatory Visit

## 2024-10-21 ENCOUNTER — Telehealth: Admitting: "Endocrinology

## 2024-11-08 ENCOUNTER — Ambulatory Visit: Admitting: Hematology and Oncology

## 2024-11-08 ENCOUNTER — Inpatient Hospital Stay: Attending: Physician Assistant

## 2024-11-24 ENCOUNTER — Encounter

## 2024-12-09 ENCOUNTER — Ambulatory Visit

## 2025-06-14 ENCOUNTER — Ambulatory Visit: Admitting: Physician Assistant

## 2026-01-22 ENCOUNTER — Encounter: Admitting: Obstetrics and Gynecology
